# Patient Record
Sex: Male | Born: 1960 | Race: Black or African American | Hispanic: No | Marital: Married | Smoking: Current every day smoker
Health system: Southern US, Community
[De-identification: ages and names within clinical notes are randomized; demographics above are authoritative.]

## PROBLEM LIST (undated history)

## (undated) DIAGNOSIS — B3781 Candidal esophagitis: Secondary | ICD-10-CM

## (undated) DIAGNOSIS — D649 Anemia, unspecified: Secondary | ICD-10-CM

## (undated) DIAGNOSIS — K920 Hematemesis: Secondary | ICD-10-CM

## (undated) DIAGNOSIS — F329 Major depressive disorder, single episode, unspecified: Secondary | ICD-10-CM

## (undated) DIAGNOSIS — I10 Essential (primary) hypertension: Secondary | ICD-10-CM

## (undated) DIAGNOSIS — K219 Gastro-esophageal reflux disease without esophagitis: Secondary | ICD-10-CM

## (undated) DIAGNOSIS — G629 Polyneuropathy, unspecified: Secondary | ICD-10-CM

## (undated) DIAGNOSIS — M21372 Foot drop, left foot: Secondary | ICD-10-CM

## (undated) DIAGNOSIS — R1310 Dysphagia, unspecified: Secondary | ICD-10-CM

## (undated) DIAGNOSIS — K7460 Unspecified cirrhosis of liver: Secondary | ICD-10-CM

## (undated) DIAGNOSIS — M62569 Muscle wasting and atrophy, not elsewhere classified, unspecified lower leg: Secondary | ICD-10-CM

## (undated) DIAGNOSIS — N179 Acute kidney failure, unspecified: Secondary | ICD-10-CM

## (undated) DIAGNOSIS — I739 Peripheral vascular disease, unspecified: Secondary | ICD-10-CM

## (undated) DIAGNOSIS — M21371 Foot drop, right foot: Secondary | ICD-10-CM

## (undated) DIAGNOSIS — F32A Depression, unspecified: Secondary | ICD-10-CM

## (undated) DIAGNOSIS — E7800 Pure hypercholesterolemia, unspecified: Secondary | ICD-10-CM

## (undated) DIAGNOSIS — K861 Other chronic pancreatitis: Secondary | ICD-10-CM

## (undated) DIAGNOSIS — Z9889 Other specified postprocedural states: Secondary | ICD-10-CM

## (undated) DIAGNOSIS — I5022 Chronic systolic (congestive) heart failure: Secondary | ICD-10-CM

## (undated) HISTORY — DX: Other specified postprocedural states: Z98.890

---

## 1999-07-08 DIAGNOSIS — K861 Other chronic pancreatitis: Secondary | ICD-10-CM

## 1999-07-08 DIAGNOSIS — G629 Polyneuropathy, unspecified: Secondary | ICD-10-CM

## 1999-07-08 DIAGNOSIS — M21371 Foot drop, right foot: Secondary | ICD-10-CM

## 1999-07-08 DIAGNOSIS — M21372 Foot drop, left foot: Secondary | ICD-10-CM

## 1999-07-08 HISTORY — PX: PANCREAS SURGERY: SHX731

## 1999-07-08 HISTORY — DX: Other chronic pancreatitis: K86.1

## 1999-07-08 HISTORY — DX: Polyneuropathy, unspecified: G62.9

## 1999-07-08 HISTORY — DX: Foot drop, right foot: M21.372

## 1999-07-08 HISTORY — DX: Foot drop, right foot: M21.371

## 2005-10-29 ENCOUNTER — Emergency Department (HOSPITAL_COMMUNITY): Admission: EM | Admit: 2005-10-29 | Discharge: 2005-10-29 | Payer: Self-pay | Admitting: Emergency Medicine

## 2006-07-07 DIAGNOSIS — I10 Essential (primary) hypertension: Secondary | ICD-10-CM

## 2006-07-07 HISTORY — DX: Essential (primary) hypertension: I10

## 2007-04-13 ENCOUNTER — Emergency Department (HOSPITAL_COMMUNITY): Admission: EM | Admit: 2007-04-13 | Discharge: 2007-04-13 | Payer: Self-pay | Admitting: Emergency Medicine

## 2007-06-03 ENCOUNTER — Emergency Department (HOSPITAL_COMMUNITY): Admission: EM | Admit: 2007-06-03 | Discharge: 2007-06-03 | Payer: Self-pay | Admitting: Emergency Medicine

## 2007-12-06 HISTORY — PX: INCISE AND DRAIN ABCESS: PRO64

## 2007-12-24 ENCOUNTER — Inpatient Hospital Stay (HOSPITAL_COMMUNITY): Admission: EM | Admit: 2007-12-24 | Discharge: 2007-12-28 | Payer: Self-pay | Admitting: Urology

## 2007-12-24 ENCOUNTER — Encounter: Payer: Self-pay | Admitting: Emergency Medicine

## 2007-12-25 ENCOUNTER — Encounter (INDEPENDENT_AMBULATORY_CARE_PROVIDER_SITE_OTHER): Payer: Self-pay | Admitting: Urology

## 2007-12-31 ENCOUNTER — Ambulatory Visit (HOSPITAL_BASED_OUTPATIENT_CLINIC_OR_DEPARTMENT_OTHER): Admission: RE | Admit: 2007-12-31 | Discharge: 2007-12-31 | Payer: Self-pay | Admitting: Urology

## 2008-04-16 ENCOUNTER — Inpatient Hospital Stay (HOSPITAL_COMMUNITY): Admission: EM | Admit: 2008-04-16 | Discharge: 2008-04-19 | Payer: Self-pay | Admitting: Emergency Medicine

## 2008-09-19 ENCOUNTER — Emergency Department (HOSPITAL_COMMUNITY): Admission: EM | Admit: 2008-09-19 | Discharge: 2008-09-19 | Payer: Self-pay | Admitting: Emergency Medicine

## 2009-02-20 ENCOUNTER — Emergency Department (HOSPITAL_COMMUNITY): Admission: EM | Admit: 2009-02-20 | Discharge: 2009-02-20 | Payer: Self-pay | Admitting: Emergency Medicine

## 2009-05-06 ENCOUNTER — Emergency Department (HOSPITAL_COMMUNITY): Admission: EM | Admit: 2009-05-06 | Discharge: 2009-05-06 | Payer: Self-pay | Admitting: Emergency Medicine

## 2010-10-17 LAB — GLUCOSE, CAPILLARY

## 2010-11-19 NOTE — Discharge Summary (Signed)
NAMEBOYKIN, BAETZ              ACCOUNT NO.:  [id number]   MEDICAL RECORD NO.:  [id number]          PATIENT TYPE:  INP   LOCATION:  1410                         FACILITY:  [facility]   PHYSICIAN:  Fleet Contras, M.D.    DATE OF BIRTH:  1960-12-15   DATE OF ADMISSION:  04/16/2008  DATE OF DISCHARGE:  04/19/2008                               DISCHARGE SUMMARY   HISTORY OF PRESENT ILLNESS:  A 50 year old gentleman with past medical  history significant for type 2 diabetes mellitus and systemic  hypertension came to the emergency room at [facility] with  progressively enlarged swelling on the right cheek for a few days, also  becoming more painful.  At the emergency room, he was found to have  slightly elevated sugars in the 400s and also a calcium level of 14.  He  was therefore admitted to the hospital for intravenous antibiotics,  insulin therapy, and further evaluation of elevated calcium.   HOSPITAL COURSE:  On admission, the patient was started on subcutaneous  Lantus insulin, 60 units q.h.s.,  as well as sliding scale NovoLog per  moderate scale, was on IV fluids with normal saline at 10 cc  an hour.  His potassium at one point was 3.4  and this was repleted orally.  Parathyroid hormone level was checked and this was better than 2.5,  total protein was 6.8, magnesium was  low at 1.1.  This was repleted  intravenously.  He was on IV Unasyn for cellulitis on the right cheek  which started draining, but no incision and drainage was needed.  A  wound care consult was requested and the patient was kindly seen and  advised for daily wound dressings.  A surgical consult was  recommended,  but this was not deemed to be necessary.  As sugars improved, he was  taken off of hydrochlorothiazide with a possibility of worsening his  hypercalcemia.  Blood pressure remained stable on Benicar 40 mg a day.  Today, he is feeling much better and would like to go home.  There is no  chest pain  or swelling of the right cheek.  Vital signs are stable.  His  CPK is 186. His calcium is 9.1, magnesium 1.3, potassium is 3.9.   DISCHARGE DIAGNOSES:  1. Abscess of the right cheek, improving on IV antibiotics.  2. Hypercalcemia resolved with hydration.  3. Systemic hypertension, well-controlled.  4. Type 2 diabetes mellitus, well-controlled.   He is now considered stable for discharge.   DISCHARGE MEDICATIONS:  1. Benicar 40 mg daily.  2. Doxycycline 100 mg b.i.d.  3. Cymbalta 60 mg daily.  4. Lunesta 3 mg at bedtime.  5. Levemir Flexpen 70 units at bedtime.  6. Lyrica 150 mg twice a day.  7. NovoLog Flexpen for sliding scale.  8. Levitra  20 mg daily.  9. ActoPlusmet 15/850mg  b.i.d.   The patient is to follow up with me in 2 days on Friday, April 21, 2008 at 2 o'clock.  His plan of care was discussed with his wife and  their questions were answered.  Fleet Contras, M.D.  Electronically Signed     EA/MEDQ  D:  04/19/2008  T:  04/19/2008  Job:  [id number]

## 2010-11-19 NOTE — Op Note (Signed)
Christian Wall, Christian Wall              ACCOUNT NO.:  [id number]   MEDICAL RECORD NO.:  [id number]          PATIENT TYPE:  INP   LOCATION:  1439                         FACILITY:  [facility]   PHYSICIAN:  Jamison Neighbor, M.D.  DATE OF BIRTH:  03-27-1961   DATE OF PROCEDURE:  12/24/2007  DATE OF DISCHARGE:                               OPERATIVE REPORT   PREOPERATIVE DIAGNOSIS:  Scrotal abscess, probable Fournier gangrene.   POSTOPERATIVE DIAGNOSIS:  Fournier gangrene.   PROCEDURE:  Incision and drainage of abscess with debridement of  necrotic tissue.   SURGEON:  Jamison Neighbor, M.D.   ANESTHESIA:  General.   COMPLICATIONS:  None.   DRAINS:  None.   BRIEF HISTORY:  This 50 year old male is known to have paraplegia and  diabetes.  The patient developed some swelling and tenderness in the  scrotum but has diminished sensation.  He was seen in the emergency room  at [facility], where he clearly had an abscess.  An ultrasound showed a  fluid collection on the scrotal wall and there was some beginning of  drainage of purulence.  The patient was transferred to [facility] to  expedite incision and drainage.  He is now to undergo the procedure.  There is some concern this could be Fournier gangrene.  The patient does  have an elevated white count and is diabetic.  He will undergo incision,  drainage and debridement of all necrotic tissue.  He understands the  risks and benefits of the procedure and gave full informed consent.   PROCEDURE:  After successful induction of general anesthesia, the  patient was placed in thesupine position, prepped with Betadine and  draped in the usual sterile fashion.  Antibiotics were prepared to be  given as soon as the cultures had been obtained.  The patient does have  some changes in mobility in the knees and hips making exposure somewhat  difficult.  The necrotic tissue was incised.  The pus within the scrotum  was evacuated.  The testicle was mobilized  in order to free up all the  purulent pockets within the right hemiscrotum.  All necrotic tissue was  debrided and sent to pathology.  Fresh bleeding tissue was obtained.  The section that was removed was somewhat larger than a 50-cent piece,  but much of the scrotal wall appears to be intact and the testicle and  cord structures appear normal.  The penile shaft is fine.  The left  hemiscrotum is fine.  It is anticipated that this should be able to heal  with wet-to-dry dressing and potentially a delayed secondary closure.  Whether the testicle can remain where it is or if it has to get put into  a lateral thigh pouch remains a little bit unclear.  The entire area was  debrided.  A pack of saline-soaked  Kerlix was placed within the wound.  The patient tolerated the procedure  well and was taken to recovery in good condition.  He did receive  intraoperative Ancef and gentamicin as soon as the cultures had been  sent.  Cultures for aerobe and  anaerobe were sent.      Jamison Neighbor, M.D.  Electronically Signed     RJE/MEDQ  D:  12/24/2007  T:  12/25/2007  Job:  [id number]

## 2010-11-19 NOTE — H&P (Signed)
NAMEOATHER, MUILENBURG              ACCOUNT NO.:  [id number]   MEDICAL RECORD NO.:  [id number]          PATIENT TYPE:  INP   LOCATION:  1439                         [facility]ACILITY:  [facility]   PHYSICIAN:  Jamison Neighbor, M.D.  DATE OF BIRTH:  [birth year]   DATE OF ADMISSION:  12/24/2007  DATE OF DISCHARGE:                              HISTORY & PHYSICAL   ADMISSION DIAGNOSIS:  Scrotal abscess, probable Fournier gangrene.   HISTORY:  This is a 50 year old black male who has a known peripheral  neuropathy and has had problems with bilateral foot drop.  This dates  back to an episode of pancreatitis.  The patient uses crutches to  ambulate and has limited mobility in the hips.  He has chronic  irritation in the scrotum.  Over the past 3 days the patient has  developed a scrotal abscess.  We note that because of his neuropathy and  has diabetes he has diminished sensation.  He really did not have any  urinary symptoms associated with this but did note a slight fever.  An  ultrasound obtained in the [facility] emergency room showed an abscess  in the scrotal wall but what appeared to be a normal testicle.  It was  unclear if there was any gas.  There was concern that this would  represent Fournier gangrene.  He was transferred to [facility] to  expedite incision and drainage of this lesion.   PAST MEDICAL HISTORY:  Remarkable for diabetes.  He is insulin-  dependent.  He also has hypertension, esophageal reflux disease,  elevated cholesterol and has problems with peripheral neuropathy.  He  notes in the past he has had problems with pancreatitis.   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   His medications at the time admission were:  1. Lyrica 150 mg b.i.d.  2. Cymbalta 60 mg daily.  3. Pancrease 2 capsules t.i.d.  4. ACTOplus twice daily.  5. Omeprazole 20 mg twice daily.  6. Benicar once daily.  7. Levemir 60 units at bedtime.  8. NovoLog  pen.   SOCIAL HISTORY:  Remarkable for the fact that he smokes 1/2 pack of  cigarettes a day.  He does not use alcohol and rarely uses any  recreational drugs.   FAMILY HISTORY:  Noncontributory.   REVIEW OF SYSTEMS:  Obtained and is pertinent for occasional diarrhea,  erectile dysfunction and esophageal reflux disease.   EXAMINATION:  The patient is a well-developed black male without acute  distress.  HEENT:  Normocephalic and atraumatic.  Cranial nerves II-XII are grossly  intact.  NECK:  Supple.  No adenopathy or thyromegaly.  LUNGS:  Clear.  HEART:  A regular rate and rhythm with no murmurs, thrills, gallops,  rubs or heaves.  ABDOMEN:  Remarkable for multiple incisions but is soft and nontender  with no palpable masses, rebound or guarding.  There was no sign of any  of the abscess spurting up bring up onto the lower abdominal wall.  GU:  A normal penis and a normal left testicle.  On the right  hemiscrotum there was an abscess that was clearly palpable and there was  necrotic skin on the surface with some drainage noted.  This has had a  very foul, purulent smell.  The wall was thickened and there was clearly  necrotic tissue.  EXTREMITIES:  Pertinent for his bilateral foot drop.  The patient is  able to walk using [place] crutches but has paraplegia.  RECTAL:  Examination was not performed.   IMPRESSION:  1. Diabetes.  2. Scrotal abscess consistent with early Fournier gangrene.   PLAN:  The patient will be admitted in preparation for a debridement.      Jamison Neighbor, M.D.  Electronically Signed     RJE/MEDQ  D:  12/24/2007  T:  12/25/2007  Job:  [id number]

## 2010-11-19 NOTE — Op Note (Signed)
NAMEDANGELO, GUZZETTA              ACCOUNT NO.:  [id number]   MEDICAL RECORD NO.:  [id number]          PATIENT TYPE:  AMB   LOCATION:  NESC                         [facility]AC[facility]  [facility]   PHYSICIAN:  Jamison Neighbor, M.D.  DATE OF BIRTH:  1960/12/15   DATE OF PROCEDURE:  12/31/2007  DATE OF DISCHARGE:                               OPERATIVE REPORT   PREOPERATIVE DIAGNOSIS:  Scrotal abscess.   POSTOPERATIVE DIAGNOSES:  Scrotal abscess.   PROCEDURE:  Incision and drainage with additional debridement status  post resection of Fournier's gangrene   SURGEON:  Jamison Neighbor, M.D.   ANESTHESIA:  General.   COMPLICATIONS:  None.   DRAINS:  None.   BRIEF HISTORY:  This 50 year old male developed scrotal abscess, was  felt to be consistent with early Fournier's gangrene while there was not  a significant amount of gas found on the original examination there was  evidence of true necrotic tissue.  The patient was taken to the  operating room on an urgent basis where he underwent incision and  drainage with debridement of a portion of the scrotal wall.  The patient  was kept in the hospital on antibiotic therapy until his white count  returned to normal and he completely defervesced.  The patient was sent  home on wet-to-dry dressings.   Follow up examination showed there was some additional necrotic tissue  along the edge of the incision.  He is now to undergo debridement.  He  understands the risks and benefits of the procedure and gave full  informed consent.   PROCEDURE:  After successful induction of general anesthesia the patient  was placed in the supine position, prepped with Betadine and draped in  the usual sterile fashion.  Inspection showed the patient had necrotic  tissue primarily on the underside of the open scrotal wound.  The depths  of the wound showed no evidence of any abscess formation.  The testicle  itself was normal and appears that good granulation tissue is  beginning  to develop.  The areas of necrosis were completely debrided until all  good granulation tissue was identified.  The bleeding edges were  cauterized where necessary with electrocautery with attempts made to  limit that as much as possible to prevent  excessive tissue damage.  The area was packed open with saline-soaked  gauze.  The patient tolerated procedure and was taken to recovery in  good condition.  He will continue on Augmentin for additional 7 days.  He will continue on his pain medication and return to see me in follow-  up in approximately 2 weeks.      Jamison Neighbor, M.D.  Electronically Signed     RJE/MEDQ  D:  12/31/2007  T:  12/31/2007  Job:  [id number]

## 2010-11-22 NOTE — Discharge Summary (Signed)
NAMEYADER, CRIGER              ACCOUNT NO.:  [id number]   MEDICAL RECORD NO.:  [id number]          PATIENT TYPE:  INP   LOCATION:  1439                         FACILITY:  [facility]   PHYSICIAN:  Jamison Neighbor, M.D.  DATE OF BIRTH:  1960-10-23   DATE OF ADMISSION:  12/24/2007  DATE OF DISCHARGE:  12/28/2007                               DISCHARGE SUMMARY   DISCHARGE DIAGNOSES:  Fournier's gangrene of scrotum.   SECONDARY DIAGNOSES:  1. Diabetes.  2. Peripheral neuropathy.  3. Elevated cholesterol.  4. Hypertension.  5. Esophageal reflux disease.  6. Tobacco use.  7. Elevated cholesterol.   HISTORY:  This 50 year old male with known peripheral neuropathy  presented to the emergency room with a scrotal swelling.  The patient  was felt to have Fourier's gangrene.  He was seen in the hospital where  he underwent limited resection of the scrotum to remove the entire  abscess and debride out the deep tissue.  The patient was admitted to  the hospital for antibiotic therapy following the procedure.   The patient really did very nicely in the hospital.  He defervesced  quite quickly.  His wounds straightened out very nicely.  The skin does  look reasonably healthy.  At first, it was thought he might not need any  debridement.  It did turn out that subsequently he did require one  additional debridement, but that was after this hospitalization.  The  patient's white count came down on antibiotic therapy.  He was given  broad-spectrum antibiotic therapy with Zosyn and gentamicin.  Gentamicin  was eventually discontinued.  The culture grew out group B strep.   The patient was sent home with Tylox and Augmentin.  It was felt that  there was additional areas that needed debridement, and arrangements  were made for him to have that done on an outpatient basis and continue  home health care wet-to-dry dressings until fully healed.      Jamison Neighbor, M.D.  Electronically  Signed     RJE/MEDQ  D:  01/18/2008  T:  01/18/2008  Job:  [id number]

## 2011-04-03 LAB — ANAEROBIC CULTURE

## 2011-04-03 LAB — CBC
HCT: 34.6 — ABNORMAL LOW
HCT: 27.3 — ABNORMAL LOW
HCT: 27.4 — ABNORMAL LOW
HCT: 27.9 — ABNORMAL LOW
Hemoglobin: 11.5 — ABNORMAL LOW
Hemoglobin: 10.3 — ABNORMAL LOW
Hemoglobin: 9 — ABNORMAL LOW
Hemoglobin: 9 — ABNORMAL LOW
Hemoglobin: 9 — ABNORMAL LOW
MCHC: 33.3
MCHC: 32.2
MCHC: 32.5
MCV: 78.4
MCV: 77 — ABNORMAL LOW
MCV: 77.4 — ABNORMAL LOW
Platelets: 216
Platelets: 322
RBC: 4.41
RBC: 3.51 — ABNORMAL LOW
RBC: 4.06 — ABNORMAL LOW
RDW: 14.5
RDW: 14.6
RDW: 14.9
RDW: 15
WBC: 18.8 — ABNORMAL HIGH
WBC: 14.7 — ABNORMAL HIGH

## 2011-04-03 LAB — BASIC METABOLIC PANEL
BUN: 14
BUN: 17
BUN: 18
BUN: 22
CO2: 25
Calcium: 9.1
Calcium: 7.6 — ABNORMAL LOW
Chloride: 93 — ABNORMAL LOW
Creatinine, Ser: 1.11
GFR calc Af Amer: >60
GFR calc Af Amer: >60
GFR calc non Af Amer: >60
GFR calc non Af Amer: >60
Glucose, Bld: 227 — ABNORMAL HIGH
Potassium: 4.1
Potassium: 5.1
Sodium: 131 — ABNORMAL LOW
Sodium: 132 — ABNORMAL LOW

## 2011-04-03 LAB — DIFFERENTIAL
Basophils Absolute: 0
Basophils Relative: 0
Eosinophils Absolute: 0
Eosinophils Relative: 0
Lymphocytes Relative: 9 — ABNORMAL LOW
Lymphs Abs: 1.7
Monocytes Absolute: 1.7 — ABNORMAL HIGH
Monocytes Relative: 9
Neutro Abs: 15.4 — ABNORMAL HIGH
Neutrophils Relative %: 82 — ABNORMAL HIGH

## 2011-04-03 LAB — URINE MICROSCOPIC-ADD ON

## 2011-04-03 LAB — COMPREHENSIVE METABOLIC PANEL
ALT: 50
AST: 33
Alkaline Phosphatase: 164 — ABNORMAL HIGH
BUN: 16
CO2: 26
Calcium: 8.3 — ABNORMAL LOW
Chloride: 99
Creatinine, Ser: 1.04
Potassium: 3.3 — ABNORMAL LOW
Sodium: 134 — ABNORMAL LOW

## 2011-04-03 LAB — WOUND CULTURE

## 2011-04-03 LAB — URINALYSIS, ROUTINE W REFLEX MICROSCOPIC
Glucose, UA: 100 — AB
Hgb urine dipstick: NEGATIVE
Ketones, ur: >80 — AB
Nitrite: POSITIVE — AB
Protein, ur: 30 — AB
Specific Gravity, Urine: 1.027
Urobilinogen, UA: 4 — ABNORMAL HIGH
pH: 5.5

## 2011-04-03 LAB — BASIC METABOLIC PANEL WITH GFR
CO2: 25
Chloride: 99
Creatinine, Ser: 1.1
GFR calc Af Amer: >60
GFR calc non Af Amer: >60
Glucose, Bld: 228 — ABNORMAL HIGH
Potassium: 3.7
Sodium: 136

## 2011-04-03 LAB — CULTURE, ROUTINE-ABSCESS

## 2011-04-03 LAB — POCT I-STAT 4, (NA,K, GLUC, HGB,HCT)
Glucose, Bld: 191 — ABNORMAL HIGH
Hemoglobin: 10.5 — ABNORMAL LOW
Operator id: 268271

## 2011-04-03 LAB — URINE CULTURE: Colony Count: 35000

## 2011-04-07 LAB — COMPREHENSIVE METABOLIC PANEL
AST: 13
AST: 16
Albumin: 3.5
BUN: 23
CO2: 38 — ABNORMAL HIGH
Chloride: 81 — ABNORMAL LOW
Chloride: 92 — ABNORMAL LOW
Creatinine, Ser: 1.11
GFR calc Af Amer: >60
Glucose, Bld: 402 — ABNORMAL HIGH
Total Bilirubin: 1
Total Bilirubin: 1
Total Protein: 6.9
Total Protein: 8.7 — ABNORMAL HIGH

## 2011-04-07 LAB — PTH, INTACT AND CALCIUM
Calcium, Total (PTH): 12.5 — ABNORMAL HIGH
PTH: <2.5 — ABNORMAL LOW

## 2011-04-07 LAB — MAGNESIUM
Magnesium: 1 — ABNORMAL LOW
Magnesium: 1.3 — ABNORMAL LOW

## 2011-04-07 LAB — URINALYSIS, ROUTINE W REFLEX MICROSCOPIC
Bilirubin Urine: NEGATIVE
Ketones, ur: 15 — AB
Specific Gravity, Urine: 1.023
Urobilinogen, UA: 0.2
pH: 6

## 2011-04-07 LAB — BASIC METABOLIC PANEL
BUN: 13
BUN: 18
CO2: 29
Calcium: 10.5
Chloride: 105
Chloride: 96
Creatinine, Ser: 0.93
GFR calc Af Amer: >60
GFR calc Af Amer: >60
GFR calc non Af Amer: >60
Potassium: 3.9

## 2011-04-07 LAB — DIFFERENTIAL
Basophils Absolute: 0.1
Basophils Relative: 1
Eosinophils Relative: 1
Lymphocytes Relative: 22
Monocytes Absolute: 0.8
Monocytes Relative: 7

## 2011-04-07 LAB — PHOSPHORUS: Phosphorus: 2.5

## 2011-04-07 LAB — GLUCOSE, CAPILLARY
Glucose-Capillary: 184 — ABNORMAL HIGH
Glucose-Capillary: 186 — ABNORMAL HIGH
Glucose-Capillary: 188 — ABNORMAL HIGH
Glucose-Capillary: 230 — ABNORMAL HIGH
Glucose-Capillary: 251 — ABNORMAL HIGH
Glucose-Capillary: 257 — ABNORMAL HIGH
Glucose-Capillary: 271 — ABNORMAL HIGH
Glucose-Capillary: 277 — ABNORMAL HIGH
Glucose-Capillary: 278 — ABNORMAL HIGH
Glucose-Capillary: 448 — ABNORMAL HIGH

## 2011-04-07 LAB — VITAMIN D 1,25 DIHYDROXY: Vit D, 1,25-Dihydroxy: 9 pg/mL — ABNORMAL LOW (ref 15–75)

## 2011-04-07 LAB — CBC
HCT: 40.2
Hemoglobin: 13.1
MCV: 76.7 — ABNORMAL LOW
Platelets: 278
RBC: 4.26
RBC: 5.31
RDW: 16.4 — ABNORMAL HIGH
WBC: 7.1

## 2011-04-07 LAB — PROTEIN ELECTROPH W RFLX QUANT IMMUNOGLOBULINS
Albumin ELP: 53 — ABNORMAL LOW
Beta 2: 6.1
Beta Globulin: 6.4
Gamma Globulin: 15.9

## 2011-04-07 LAB — CALCIUM, IONIZED: Calcium, Ion: 1.76 — ABNORMAL HIGH

## 2011-04-07 LAB — VITAMIN D 25 HYDROXY (VIT D DEFICIENCY, FRACTURES): Vit D, 25-Hydroxy: 7 — ABNORMAL LOW (ref 30–89)

## 2011-04-15 LAB — COMPREHENSIVE METABOLIC PANEL
ALT: 14
AST: 14
Albumin: 4.6
Alkaline Phosphatase: 67
Chloride: 92 — ABNORMAL LOW
Potassium: 4
Sodium: 134 — ABNORMAL LOW
Total Protein: 8.9 — ABNORMAL HIGH

## 2011-04-15 LAB — DIFFERENTIAL
Basophils Relative: 1
Eosinophils Absolute: 0 — ABNORMAL LOW
Eosinophils Relative: 0
Monocytes Absolute: 0.5
Monocytes Relative: 5

## 2011-04-15 LAB — CBC
Platelets: 386
RDW: 14.4
WBC: 8.6

## 2012-02-25 ENCOUNTER — Encounter (HOSPITAL_COMMUNITY): Payer: Self-pay

## 2012-02-25 ENCOUNTER — Emergency Department (HOSPITAL_COMMUNITY): Payer: Medicare Other

## 2012-02-25 ENCOUNTER — Emergency Department (HOSPITAL_COMMUNITY)
Admission: EM | Admit: 2012-02-25 | Discharge: 2012-02-25 | Disposition: A | Discharge status: Home / Self Care | Payer: Medicare Other | Attending: Emergency Medicine | Admitting: Emergency Medicine

## 2012-02-25 DIAGNOSIS — E119 Type 2 diabetes mellitus without complications: Secondary | ICD-10-CM | POA: Insufficient documentation

## 2012-02-25 DIAGNOSIS — F17200 Nicotine dependence, unspecified, uncomplicated: Secondary | ICD-10-CM | POA: Insufficient documentation

## 2012-02-25 DIAGNOSIS — M25519 Pain in unspecified shoulder: Secondary | ICD-10-CM | POA: Insufficient documentation

## 2012-02-25 DIAGNOSIS — M62838 Other muscle spasm: Secondary | ICD-10-CM | POA: Insufficient documentation

## 2012-02-25 HISTORY — DX: Muscle wasting and atrophy, not elsewhere classified, unspecified lower leg: M62.569

## 2012-02-25 MED ORDER — HYDROCODONE-ACETAMINOPHEN 5-325 MG PO TABS: 2.0000 | ORAL_TABLET | Freq: Four times a day (QID) | ORAL | Status: AC | PRN

## 2012-02-25 MED ORDER — METHOCARBAMOL 500 MG PO TABS: 500.0000 mg | ORAL_TABLET | Freq: Two times a day (BID) | ORAL | Status: AC

## 2012-02-25 MED ORDER — HYDROCODONE-ACETAMINOPHEN 5-325 MG PO TABS
2.0000 | ORAL_TABLET | Freq: Once | ORAL | Status: AC
  Administered 2012-02-25: 2 via ORAL
  Filled 2012-02-25: qty 2

## 2012-02-25 NOTE — ED Notes (Signed)
Pt c/o left shoulder blade pain to upper left arm pain x 12 hours; states strained left arm about two wks ago and has hurt off and on since then

## 2012-02-25 NOTE — ED Provider Notes (Signed)
History     CSN: [id number]  Arrival date & time 02/25/12  0509   First MD Initiated Contact with Patient 02/25/12 0534      Chief Complaint  Patient presents with  . Shoulder Pain    (Consider location/radiation/quality/duration/timing/severity/associated sxs/prior treatment) HPI Comments: Patient presenting with pain of the left side of his neck, upper back, and left scapula.  Pain has been present for the past day.  He reports that he has been doing some heavy lifting recently.  He describes the pain as "muscle spasms" He denies any trauma.  No numbness or tingling.  No erythema or swelling.  He has   The history is provided by the patient.    Past Medical History  Diagnosis Date  . Diabetes mellitus   . Atrophy of calf muscles     Past Surgical History  Procedure Date  . Pancreas surgery     No family history on file.  History  Substance Use Topics  . Smoking status: Current Everyday Smoker -- 1.0 packs/day for 30 years    Types: Cigarettes  . Smokeless tobacco: Not on file  . Alcohol Use:       Review of Systems  Constitutional: Negative for fever and chills.  HENT: Positive for neck pain.   Musculoskeletal: Positive for back pain. Negative for joint swelling.  Skin: Negative for color change and wound.  Neurological: Negative for numbness and headaches.    Allergies  Review of patient's allergies indicates no known allergies.  Home Medications   Current Outpatient Rx  Name Route Sig Dispense Refill  . DULOXETINE HCL 60 MG PO CPEP Oral Take 60 mg by mouth daily.    Marland Kitchen ESOMEPRAZOLE MAGNESIUM 40 MG PO CPDR Oral Take 40 mg by mouth daily before breakfast.    . INSULIN ASPART 100 UNIT/ML [place] SOLN Subcutaneous Inject 3-6 Units into the skin 3 (three) times daily before meals. Per sliding scale depending on what he eats    . INSULIN GLARGINE 100 UNIT/ML [place] SOLN Subcutaneous Inject 70 Units into the skin at bedtime.    Marland Kitchen PANCRELIPASE (LIP-PROT-AMYL) 12000  UNITS PO CPEP Oral Take 1-3 capsules by mouth 2 (two) times daily. 3 capsules before meal, and 1 capsule before snack    . PIOGLITAZONE HCL-METFORMIN HCL 15-850 MG PO TABS Oral Take 1 tablet by mouth 2 (two) times daily with a meal.    . PREGABALIN 150 MG PO CAPS Oral Take 150 mg by mouth 2 (two) times daily.      BP 151/83  Pulse 102  Temp 97.7 F (36.5 C)  Resp 20  SpO2 97%  Physical Exam  Nursing note and vitals reviewed. Constitutional: He appears well-developed and well-nourished. No distress.  HENT:  Head: Normocephalic and atraumatic.  Neck: Normal range of motion. Neck supple.  Cardiovascular: Normal rate, regular rhythm and normal heart sounds.   Pulmonary/Chest: Effort normal and breath sounds normal.  Musculoskeletal:       Left shoulder: He exhibits no tenderness, no bony tenderness, no swelling, no effusion, no deformity and normal pulse.       Pain with ROM of left shoulder  Neurological: He is alert. No sensory deficit.  Skin: Skin is warm and dry. He is not diaphoretic.    ED Course  Procedures (including critical care time)  Labs Reviewed - No data to display Dg Shoulder Left  02/25/2012  *RADIOLOGY REPORT*  Clinical Data: Shoulder pain and limited range of motion for 2 days.  No injury.  LEFT SHOULDER - 2+ VIEW  Comparison: Chest 04/16/2008  Findings: Degenerative changes in the left shoulder with prominent ossification of the coracoclavicular joint and calcified loose bodies in the shoulder joint as well as exostosis or bursal calcification medial to the humeral metaphysis.  Changes likely represent degenerative changes with either calcific tendonitis or synovial osteochondromatosis versus dystrophic calcification. Changes were present on the previous chest radiograph but appeared to have progressed since that time.  There is no evidence of acute fracture or subluxation.  No destructive bone lesions.  Bone cortex and trabecular architecture appear intact.   IMPRESSION: Calcification in the coracoclavicular ligament and multiple calcified loose bodies and around the shoulder joint.  No acute fracture or subluxation.   Original Report Authenticated By: Marlon Pel, M.D.      No diagnosis found.    MDM  Patient presenting with pain of the left trapezius and left shoulder.  Negative xray.  Neurovascularly intact.  Suspect muscle strain.         Pascal Lux [place], PA-C 02/26/12 1304

## 2012-02-27 ENCOUNTER — Emergency Department (HOSPITAL_COMMUNITY)
Admission: EM | Admit: 2012-02-27 | Discharge: 2012-02-27 | Disposition: A | Discharge status: Home / Self Care | Payer: Medicare Other | Attending: Emergency Medicine | Admitting: Emergency Medicine

## 2012-02-27 ENCOUNTER — Encounter (HOSPITAL_COMMUNITY): Payer: Self-pay | Admitting: Emergency Medicine

## 2012-02-27 DIAGNOSIS — Z794 Long term (current) use of insulin: Secondary | ICD-10-CM | POA: Insufficient documentation

## 2012-02-27 DIAGNOSIS — F17200 Nicotine dependence, unspecified, uncomplicated: Secondary | ICD-10-CM | POA: Insufficient documentation

## 2012-02-27 DIAGNOSIS — Z79899 Other long term (current) drug therapy: Secondary | ICD-10-CM | POA: Insufficient documentation

## 2012-02-27 DIAGNOSIS — E119 Type 2 diabetes mellitus without complications: Secondary | ICD-10-CM | POA: Insufficient documentation

## 2012-02-27 DIAGNOSIS — M25519 Pain in unspecified shoulder: Secondary | ICD-10-CM | POA: Insufficient documentation

## 2012-02-27 MED ORDER — HYDROMORPHONE HCL PF 2 MG/ML IJ SOLN
2.0000 mg | Freq: Once | INTRAMUSCULAR | Status: AC
  Administered 2012-02-27: 2 mg via INTRAMUSCULAR
  Filled 2012-02-27: qty 1

## 2012-02-27 MED ORDER — ZOLPIDEM TARTRATE 5 MG PO TABS: 5.0000 mg | ORAL_TABLET | Freq: Every evening | ORAL | Status: DC | PRN

## 2012-02-27 MED ORDER — HYDROCODONE-ACETAMINOPHEN 5-500 MG PO TABS: 1.0000 | ORAL_TABLET | Freq: Four times a day (QID) | ORAL | Status: AC | PRN

## 2012-02-27 MED ORDER — ONDANSETRON 4 MG PO TBDP
4.0000 mg | ORAL_TABLET | Freq: Once | ORAL | Status: AC
  Administered 2012-02-27: 4 mg via ORAL
  Filled 2012-02-27: qty 1

## 2012-02-27 NOTE — ED Notes (Signed)
Patient is alert and oriented x3.  He is complaining of left arm, shoulder and neck pain that started 4 days ago.   He was seen here earlier this week for same issue. He states that it is possible that he strained his shoulder lifting weights. The pain can be palpated in the left arm currently.  He has complete ROM of motion of the left arm.  Current pain is rated 10 of 10

## 2012-02-27 NOTE — ED Provider Notes (Signed)
History     CSN: [id number]  Arrival date & time 02/27/12  0244   First MD Initiated Contact with Patient 02/27/12 0354      Chief Complaint  Patient presents with  . Shoulder Pain    (Consider location/radiation/quality/duration/timing/severity/associated sxs/prior treatment) HPI   Pt presents to the ER with complaints of left shoulder pain. He was seen two days ago in the ER and given Robaxin and percocet and says taht it has not helped at all. The patient uses hand crutches to walk around due to atrophy of calf muscles. He is also a diabetic. He says in order to get around he has to use the crutches and with his shoulder hurting, things are becoming unbearable and that he is not sleeping because of the pain and has become very grouchy. The pain goes down his left arm towards his elbow, over his left scapula, left ribs, and up his left neck. Touching and moving those areas worsen the pain. Pain has been persistent for 1 week and nothing has made it better. Pain 10/10.  Pt is in NAD and VSS but he is very agitated. Past Medical History  Diagnosis Date  . Diabetes mellitus   . Atrophy of calf muscles     Past Surgical History  Procedure Date  . Pancreas surgery     History reviewed. No pertinent family history.  History  Substance Use Topics  . Smoking status: Current Everyday Smoker -- 1.0 packs/day for 30 years    Types: Cigarettes  . Smokeless tobacco: Not on file  . Alcohol Use:       Review of Systems   HEENT: denies blurry vision or change in hearing PULMONARY: Denies difficulty breathing and SOB CARDIAC: denies chest pain or heart palpitations MUSCULOSKELETAL:   unable to ambulate due to atrophy ABDOMEN AL: denies abdominal pain GU: denies loss of bowel or urinary control NEURO: denies numbness and tingling in extremities SKIN: no new rashes PSYCH: patient denies anxiety or depression. NECK: Pt denies having neck pain     Allergies  Review of  patient's allergies indicates no known allergies.  Home Medications   Current Outpatient Rx  Name Route Sig Dispense Refill  . DULOXETINE HCL 60 MG PO CPEP Oral Take 60 mg by mouth daily.    Marland Kitchen ESOMEPRAZOLE MAGNESIUM 40 MG PO CPDR Oral Take 40 mg by mouth daily before breakfast.    . HYDROCODONE-ACETAMINOPHEN 5-325 MG PO TABS Oral Take 2 tablets by mouth every 6 (six) hours as needed for pain. 10 tablet 0  . INSULIN ASPART 100 UNIT/ML [place] SOLN Subcutaneous Inject 3-6 Units into the skin 3 (three) times daily before meals. Per sliding scale depending on what he eats    . INSULIN GLARGINE 100 UNIT/ML [place] SOLN Subcutaneous Inject 70 Units into the skin at bedtime.    Marland Kitchen PANCRELIPASE (LIP-PROT-AMYL) 12000 UNITS PO CPEP Oral Take 1-3 capsules by mouth 2 (two) times daily. 3 capsules before meal, and 1 capsule before snack    . METHOCARBAMOL 500 MG PO TABS Oral Take 1 tablet (500 mg total) by mouth 2 (two) times daily. 20 tablet 0  . PIOGLITAZONE HCL-METFORMIN HCL 15-850 MG PO TABS Oral Take 1 tablet by mouth 2 (two) times daily with a meal.    . PREGABALIN 150 MG PO CAPS Oral Take 150 mg by mouth 2 (two) times daily.      BP 170/88  Pulse 96  Temp 98.9 F (37.2 C) (Oral)  Resp  20  SpO2 92%  Physical Exam  Nursing note and vitals reviewed. Constitutional: He appears well-developed and well-nourished. No distress.  HENT:  Head: Normocephalic and atraumatic.  Eyes: Pupils are equal, round, and reactive to light.  Neck: Normal range of motion. Neck supple.  Cardiovascular: Normal rate and regular rhythm.   Pulmonary/Chest: Effort normal.  Abdominal: Soft.  Musculoskeletal:       Left shoulder: He exhibits tenderness, bony tenderness, pain and spasm. He exhibits normal range of motion, no swelling, no effusion, no crepitus, no deformity, no laceration, normal pulse and normal strength.  Neurological: He is alert.  Skin: Skin is warm and dry.    ED Course  Procedures (including critical  care time)  Labs Reviewed - No data to display Dg Shoulder Left  02/25/2012  *RADIOLOGY REPORT*  Clinical Data: Shoulder pain and limited range of motion for 2 days.  No injury.  LEFT SHOULDER - 2+ VIEW  Comparison: Chest 04/16/2008  Findings: Degenerative changes in the left shoulder with prominent ossification of the coracoclavicular joint and calcified loose bodies in the shoulder joint as well as exostosis or bursal calcification medial to the humeral metaphysis.  Changes likely represent degenerative changes with either calcific tendonitis or synovial osteochondromatosis versus dystrophic calcification. Changes were present on the previous chest radiograph but appeared to have progressed since that time.  There is no evidence of acute fracture or subluxation.  No destructive bone lesions.  Bone cortex and trabecular architecture appear intact.  IMPRESSION: Calcification in the coracoclavicular ligament and multiple calcified loose bodies and around the shoulder joint.  No acute fracture or subluxation.   Original Report Authenticated By: Marlon Pel, M.D.      1. Shoulder pain       MDM  xrays done yesterday which shows calcification of joint.   I think he needs Ortho to have an MRI done. Pt most likely has ligament issue due to chronic injury from hand crutches.   Pt given pain medication in ER.  He is to take Ibuprofen TID with food, will give zolpidem and refill pain meds. Pt also given ortho referral.       Dorthula Matas, PA 02/27/12 [phone]

## 2012-02-27 NOTE — ED Notes (Signed)
Pt states he is having pain in his left shoulder into his back, neck and chest  Pt states he was here a couple days ago for same  Pt states he had xrays done at that time that were negative  Pt states the pain has been continuous since he was here the other day

## 2012-02-27 NOTE — ED Notes (Signed)
Patient is alert and oriented x3.  He was given DC instructions and follow up visit instructions.  Patient gave verbal understanding.  He was DC via wheelchair with family to home.  V/S stable.  He was not showing any signs of distress on DC 

## 2012-02-27 NOTE — ED Provider Notes (Signed)
Medical screening examination/treatment/procedure(s) were performed by non-physician practitioner and as supervising physician I was immediately available for consultation/collaboration.  Toy Baker, MD 02/27/12 [phone]

## 2012-02-28 NOTE — ED Provider Notes (Signed)
Medical screening examination/treatment/procedure(s) were performed by non-physician practitioner and as supervising physician I was immediately available for consultation/collaboration.   Kenroy Timberman, MD 02/28/12 2303 

## 2012-03-12 ENCOUNTER — Other Ambulatory Visit (HOSPITAL_COMMUNITY): Payer: Self-pay | Admitting: Orthopedic Surgery

## 2012-03-12 DIAGNOSIS — M542 Cervicalgia: Secondary | ICD-10-CM

## 2012-03-16 ENCOUNTER — Ambulatory Visit (HOSPITAL_COMMUNITY)
Admission: RE | Admit: 2012-03-16 | Discharge: 2012-03-16 | Disposition: A | Discharge status: Home / Self Care | Payer: Medicare Other | Source: Ambulatory Visit | Attending: Orthopedic Surgery | Admitting: Orthopedic Surgery

## 2012-03-16 DIAGNOSIS — M542 Cervicalgia: Secondary | ICD-10-CM

## 2012-03-16 DIAGNOSIS — M5020 Other cervical disc displacement, unspecified cervical region: Secondary | ICD-10-CM | POA: Insufficient documentation

## 2012-04-07 ENCOUNTER — Other Ambulatory Visit: Payer: Self-pay | Admitting: Neurosurgery

## 2012-04-08 ENCOUNTER — Encounter (HOSPITAL_COMMUNITY): Payer: Self-pay | Admitting: Pharmacy Technician

## 2012-04-09 ENCOUNTER — Encounter (HOSPITAL_COMMUNITY)
Admission: RE | Admit: 2012-04-09 | Discharge: 2012-04-09 | Disposition: A | Discharge status: Home / Self Care | Payer: Medicare Other | Source: Ambulatory Visit | Attending: Neurosurgery | Admitting: Neurosurgery

## 2012-04-09 ENCOUNTER — Encounter (HOSPITAL_COMMUNITY): Payer: Self-pay

## 2012-04-09 HISTORY — DX: Major depressive disorder, single episode, unspecified: F32.9

## 2012-04-09 HISTORY — DX: Depression, unspecified: F32.A

## 2012-04-09 LAB — CBC WITH DIFFERENTIAL/PLATELET
Basophils Absolute: 0 10*3/uL (ref 0.0–0.1)
Eosinophils Absolute: 0.1 10*3/uL (ref 0.0–0.7)
HCT: 40.4 % (ref 39.0–52.0)
Lymphs Abs: 2 10*3/uL (ref 0.7–4.0)
MCH: 25 pg — ABNORMAL LOW (ref 26.0–34.0)
MCHC: 33.4 g/dL (ref 30.0–36.0)
MCV: 74.8 fL — ABNORMAL LOW (ref 78.0–100.0)
Monocytes Absolute: 0.4 10*3/uL (ref 0.1–1.0)
Neutro Abs: 6.4 10*3/uL (ref 1.7–7.7)
RDW: 14.9 % (ref 11.5–15.5)

## 2012-04-09 LAB — BASIC METABOLIC PANEL
BUN: 17 mg/dL (ref 6–23)
CO2: 30 mEq/L (ref 19–32)
Chloride: 91 mEq/L — ABNORMAL LOW (ref 96–112)
Creatinine, Ser: 0.8 mg/dL (ref 0.50–1.35)

## 2012-04-09 LAB — SURGICAL PCR SCREEN: Staphylococcus aureus: NEGATIVE

## 2012-04-09 NOTE — Pre-Procedure Instructions (Signed)
20 Christian Wall  04/09/2012   Your procedure is scheduled on:  Tuesday April 13, 2012 at 1040 AM  Report to [facility] at 0840 AM.  Call this number if you have problems the morning of surgery: [phone]   Remember:   Do not eat food or drink:After Midnight.      Take these medicines the morning of surgery with A SIP OF WATER: No diabetic  meds or insulin. Take Cymbalta, Nexium, and Lyrica   Do not wear jewelry,   Do not wear lotions, powders, or perfumes. You may wear deodorant.  Do not shave 48 hours prior to surgery. Men may shave face and neck.  Do not bring valuables to the hospital.  Contacts, dentures or bridgework may not be worn into surgery.  Leave suitcase in the car. After surgery it may be brought to your room.  For patients admitted to the hospital, checkout time is 11:00 AM the day of discharge.   Patients discharged the day of surgery will not be allowed to drive home.    Special Instructions: Shower using CHG 2 nights before surgery and the night before surgery.  If you shower the day of surgery use CHG.  Use special wash - you have one bottle of CHG for all showers.  You should use approximately 1/3 of the bottle for each shower.   Please read over the following fact sheets that you were given: Pain Booklet, Coughing and Deep Breathing, Total Joint Packet and Surgical Site Infection Prevention

## 2012-04-12 MED ORDER — DEXAMETHASONE SODIUM PHOSPHATE 10 MG/ML IJ SOLN
10.0000 mg | INTRAMUSCULAR | Status: AC
  Administered 2012-04-13: 10 mg via INTRAVENOUS
  Filled 2012-04-12: qty 1

## 2012-04-12 MED ORDER — CEFAZOLIN SODIUM-DEXTROSE 2-3 GM-% IV SOLR
2.0000 g | INTRAVENOUS | Status: AC
  Administered 2012-04-13: 2 g via INTRAVENOUS
  Filled 2012-04-12: qty 50

## 2012-04-12 NOTE — Consult Note (Signed)
Anesthesia Chart Review:  Patient is a 51 year old male scheduled for C5-6 ACDF on 04/13/12 by Dr. Jordan Likes.  History includes smoking, DM2, depression, pancreatic surgery (not specified).  BMI 29.  PCP is Dr. Fleet Contras.  EKG on 04/09/12 showed ST @ 113 bpm, possible anterior infarct (age undetermined), prolonged QT.  Possible anterior infarct changes were present on prior EKGs from 2009 (see Muse).  CXR impression on 04/09/12 stated: Generalized hyperinflation configuration consistent with COPD. Patchy density in the right cardiophrenic angle region on the PA image which appears new or more prominent compared with previous study. This is consistent with a small area of atelectasis and infiltrative density in the medial aspect of the right middle lobe. No consolidation is evident. Suggest radiographic follow-up after therapy. There is some central peribronchial thickening. This may be associated with bronchitis, asthma, and reactive airway disease.  Labs noted.  WBC 8.9.  Non-fasting glucose 332.  Recheck CBG on arrival.  I called and spoke with Mr. Wissner earlier today.  He does smoke, but denies SOB, chest pain, fever, chills, cough, or other URI symptoms.  He reports his home glucose readings are typically in the 100-200's range.  I reviewed Mr. Sena's CXR findings with him.  He is aware that he will need a follow-up CXR as determined by Dr. Jordan Likes or his PCP, and possibly a CT scan of the chest if his CXR abnormality persistent.  Subjectively, he does not report symptoms consistent with PNA.    I called his CXR results to [facility] at Dr. Lindalou Hose office and to [facility] at Dr. Albertina Parr office.  Also his EKG, CXR, labs results from 04/09/12 were faxed to Dr. Albertina Parr office with confirmation.  I reviewed above with Anesthesiologist Dr. Noreene Larsson.  If patient does not present with new respiratory symptoms and no worrisome cardiopulmonary findings on physical exam then would anticipate he could proceed  from an anesthesia standpoint with plan for post-operative follow-up as discussed above.  Shonna Chock, PA-C 04/12/12 1146

## 2012-04-13 ENCOUNTER — Encounter (HOSPITAL_COMMUNITY): Payer: Self-pay | Admitting: Neurosurgery

## 2012-04-13 ENCOUNTER — Encounter (HOSPITAL_COMMUNITY): Payer: Self-pay | Admitting: Vascular Surgery

## 2012-04-13 ENCOUNTER — Inpatient Hospital Stay (HOSPITAL_COMMUNITY): Payer: Medicare Other | Admitting: Vascular Surgery

## 2012-04-13 ENCOUNTER — Inpatient Hospital Stay (HOSPITAL_COMMUNITY)
Admission: RE | Admit: 2012-04-13 | Discharge: 2012-04-14 | DRG: 473 | Disposition: A | Discharge status: Home / Self Care | Payer: Medicare Other | Source: Ambulatory Visit | Attending: Neurosurgery | Admitting: Neurosurgery

## 2012-04-13 ENCOUNTER — Encounter (HOSPITAL_COMMUNITY): Payer: Self-pay | Admitting: Surgery

## 2012-04-13 ENCOUNTER — Encounter (HOSPITAL_COMMUNITY)
Admission: RE | Disposition: A | Discharge status: Home / Self Care | Payer: Self-pay | Source: Ambulatory Visit | Attending: Neurosurgery

## 2012-04-13 ENCOUNTER — Inpatient Hospital Stay (HOSPITAL_COMMUNITY): Payer: Medicare Other

## 2012-04-13 DIAGNOSIS — F3289 Other specified depressive episodes: Secondary | ICD-10-CM | POA: Diagnosis present

## 2012-04-13 DIAGNOSIS — Z01812 Encounter for preprocedural laboratory examination: Secondary | ICD-10-CM

## 2012-04-13 DIAGNOSIS — Z79899 Other long term (current) drug therapy: Secondary | ICD-10-CM

## 2012-04-13 DIAGNOSIS — M6250 Muscle wasting and atrophy, not elsewhere classified, unspecified site: Secondary | ICD-10-CM | POA: Diagnosis present

## 2012-04-13 DIAGNOSIS — M5010 Cervical disc disorder with radiculopathy, unspecified cervical region: Secondary | ICD-10-CM | POA: Diagnosis present

## 2012-04-13 DIAGNOSIS — M5020 Other cervical disc displacement, unspecified cervical region: Principal | ICD-10-CM | POA: Diagnosis present

## 2012-04-13 DIAGNOSIS — Z794 Long term (current) use of insulin: Secondary | ICD-10-CM

## 2012-04-13 DIAGNOSIS — F329 Major depressive disorder, single episode, unspecified: Secondary | ICD-10-CM | POA: Diagnosis present

## 2012-04-13 DIAGNOSIS — F17200 Nicotine dependence, unspecified, uncomplicated: Secondary | ICD-10-CM | POA: Diagnosis present

## 2012-04-13 DIAGNOSIS — Z23 Encounter for immunization: Secondary | ICD-10-CM

## 2012-04-13 DIAGNOSIS — E119 Type 2 diabetes mellitus without complications: Secondary | ICD-10-CM | POA: Diagnosis present

## 2012-04-13 HISTORY — PX: ANTERIOR CERVICAL DECOMP/DISCECTOMY FUSION: SHX1161

## 2012-04-13 LAB — GLUCOSE, CAPILLARY
Glucose-Capillary: 177 mg/dL — ABNORMAL HIGH (ref 70–99)
Glucose-Capillary: 329 mg/dL — ABNORMAL HIGH (ref 70–99)

## 2012-04-13 SURGERY — ANTERIOR CERVICAL DECOMPRESSION/DISCECTOMY FUSION 1 LEVEL
Anesthesia: General | Site: Spine Cervical | Wound class: Clean

## 2012-04-13 MED ORDER — METFORMIN HCL 850 MG PO TABS
850.0000 mg | ORAL_TABLET | Freq: Two times a day (BID) | ORAL | Status: DC
  Administered 2012-04-13 – 2012-04-14 (×2): 850 mg via ORAL
  Filled 2012-04-13 (×4): qty 1

## 2012-04-13 MED ORDER — SODIUM CHLORIDE 0.9 % IJ SOLN: 3.0000 mL | INTRAMUSCULAR | Status: DC | PRN

## 2012-04-13 MED ORDER — LACTATED RINGERS IV SOLN
INTRAVENOUS | Status: DC | PRN
  Administered 2012-04-13 (×2): via INTRAVENOUS

## 2012-04-13 MED ORDER — PIOGLITAZONE HCL 15 MG PO TABS
15.0000 mg | ORAL_TABLET | Freq: Two times a day (BID) | ORAL | Status: DC
  Administered 2012-04-13 – 2012-04-14 (×2): 15 mg via ORAL
  Filled 2012-04-13 (×4): qty 1

## 2012-04-13 MED ORDER — HEMOSTATIC AGENTS (NO CHARGE) OPTIME
TOPICAL | Status: DC | PRN
  Administered 2012-04-13: 1 via TOPICAL

## 2012-04-13 MED ORDER — PANCRELIPASE (LIP-PROT-AMYL) 12000-38000 UNITS PO CPEP
1.0000 | ORAL_CAPSULE | Freq: Two times a day (BID) | ORAL | Status: DC
  Filled 2012-04-13: qty 3

## 2012-04-13 MED ORDER — INSULIN GLARGINE 100 UNIT/ML ~~LOC~~ SOLN
70.0000 [IU] | Freq: Every day | SUBCUTANEOUS | Status: DC
  Administered 2012-04-13: 70 [IU] via SUBCUTANEOUS

## 2012-04-13 MED ORDER — SODIUM CHLORIDE 0.9 % IV SOLN
INTRAVENOUS | Status: AC
  Filled 2012-04-13: qty 500

## 2012-04-13 MED ORDER — PANCRELIPASE (LIP-PROT-AMYL) 12000-38000 UNITS PO CPEP
1.0000 | ORAL_CAPSULE | ORAL | Status: DC | PRN
  Filled 2012-04-13: qty 1

## 2012-04-13 MED ORDER — PROPOFOL 10 MG/ML IV BOLUS
INTRAVENOUS | Status: DC | PRN
  Administered 2012-04-13: 120 mg via INTRAVENOUS

## 2012-04-13 MED ORDER — MIDAZOLAM HCL 5 MG/5ML IJ SOLN
INTRAMUSCULAR | Status: DC | PRN
  Administered 2012-04-13: 2 mg via INTRAVENOUS

## 2012-04-13 MED ORDER — HYDROMORPHONE HCL PF 1 MG/ML IJ SOLN
INTRAMUSCULAR | Status: AC
  Administered 2012-04-13: 0.5 mg via INTRAVENOUS
  Filled 2012-04-13: qty 1

## 2012-04-13 MED ORDER — ONDANSETRON HCL 4 MG/2ML IJ SOLN: 4.0000 mg | INTRAMUSCULAR | Status: DC | PRN

## 2012-04-13 MED ORDER — EPHEDRINE SULFATE 50 MG/ML IJ SOLN
INTRAMUSCULAR | Status: DC | PRN
  Administered 2012-04-13 (×2): 5 mg via INTRAVENOUS

## 2012-04-13 MED ORDER — INSULIN ASPART 100 UNIT/ML ~~LOC~~ SOLN
3.0000 [IU] | Freq: Three times a day (TID) | SUBCUTANEOUS | Status: DC
  Administered 2012-04-14: 3 [IU] via SUBCUTANEOUS

## 2012-04-13 MED ORDER — PANTOPRAZOLE SODIUM 40 MG PO TBEC: 80.0000 mg | DELAYED_RELEASE_TABLET | Freq: Every day | ORAL | Status: DC

## 2012-04-13 MED ORDER — ALUM & MAG HYDROXIDE-SIMETH 200-200-20 MG/5ML PO SUSP: 30.0000 mL | Freq: Four times a day (QID) | ORAL | Status: DC | PRN

## 2012-04-13 MED ORDER — OXYCODONE HCL 5 MG/5ML PO SOLN: 5.0000 mg | Freq: Once | ORAL | Status: AC | PRN

## 2012-04-13 MED ORDER — SODIUM CHLORIDE 0.9 % IV SOLN: 250.0000 mL | INTRAVENOUS | Status: DC

## 2012-04-13 MED ORDER — GLYCOPYRROLATE 0.2 MG/ML IJ SOLN
INTRAMUSCULAR | Status: DC | PRN
  Administered 2012-04-13: .8 mg via INTRAVENOUS

## 2012-04-13 MED ORDER — FENTANYL CITRATE 0.05 MG/ML IJ SOLN
INTRAMUSCULAR | Status: DC | PRN
  Administered 2012-04-13: 150 ug via INTRAVENOUS
  Administered 2012-04-13: 50 ug via INTRAVENOUS

## 2012-04-13 MED ORDER — PIOGLITAZONE HCL-METFORMIN HCL 15-850 MG PO TABS: 1.0000 | ORAL_TABLET | Freq: Two times a day (BID) | ORAL | Status: DC

## 2012-04-13 MED ORDER — PHENYLEPHRINE HCL 10 MG/ML IJ SOLN
10.0000 mg | INTRAMUSCULAR | Status: DC | PRN
  Administered 2012-04-13: 20 ug/min via INTRAVENOUS

## 2012-04-13 MED ORDER — ONDANSETRON HCL 4 MG/2ML IJ SOLN
INTRAMUSCULAR | Status: DC | PRN
  Administered 2012-04-13: 4 mg via INTRAVENOUS

## 2012-04-13 MED ORDER — NEOSTIGMINE METHYLSULFATE 1 MG/ML IJ SOLN
INTRAMUSCULAR | Status: DC | PRN
  Administered 2012-04-13: 5 mg via INTRAVENOUS

## 2012-04-13 MED ORDER — INFLUENZA VIRUS VACC SPLIT PF IM SUSP
0.5000 mL | INTRAMUSCULAR | Status: DC
  Filled 2012-04-13: qty 0.5

## 2012-04-13 MED ORDER — SODIUM CHLORIDE 0.9 % IR SOLN
Status: DC | PRN
  Administered 2012-04-13: 12:00:00

## 2012-04-13 MED ORDER — PHENOL 1.4 % MT LIQD: 1.0000 | OROMUCOSAL | Status: DC | PRN

## 2012-04-13 MED ORDER — HYDROCODONE-ACETAMINOPHEN 5-325 MG PO TABS: 1.0000 | ORAL_TABLET | ORAL | Status: DC | PRN

## 2012-04-13 MED ORDER — CYCLOBENZAPRINE HCL 10 MG PO TABS
10.0000 mg | ORAL_TABLET | Freq: Three times a day (TID) | ORAL | Status: DC | PRN
  Administered 2012-04-13: 10 mg via ORAL
  Filled 2012-04-13: qty 1

## 2012-04-13 MED ORDER — OXYCODONE-ACETAMINOPHEN 5-325 MG PO TABS
1.0000 | ORAL_TABLET | ORAL | Status: DC | PRN
  Administered 2012-04-13 – 2012-04-14 (×3): 2 via ORAL
  Filled 2012-04-13 (×3): qty 2

## 2012-04-13 MED ORDER — DEXTROSE 5 % IV SOLN
INTRAVENOUS | Status: DC | PRN
  Administered 2012-04-13: 11:00:00 via INTRAVENOUS

## 2012-04-13 MED ORDER — POLYETHYLENE GLYCOL 3350 17 G PO PACK
17.0000 g | PACK | Freq: Every day | ORAL | Status: DC | PRN
  Filled 2012-04-13: qty 1

## 2012-04-13 MED ORDER — PREGABALIN 50 MG PO CAPS
150.0000 mg | ORAL_CAPSULE | Freq: Two times a day (BID) | ORAL | Status: DC
Start: 2012-04-13 — End: 2012-04-13

## 2012-04-13 MED ORDER — DULOXETINE HCL 60 MG PO CPEP
60.0000 mg | ORAL_CAPSULE | Freq: Every day | ORAL | Status: DC
  Filled 2012-04-13: qty 1

## 2012-04-13 MED ORDER — ROCURONIUM BROMIDE 100 MG/10ML IV SOLN
INTRAVENOUS | Status: DC | PRN
  Administered 2012-04-13: 50 mg via INTRAVENOUS

## 2012-04-13 MED ORDER — PHENYLEPHRINE HCL 10 MG/ML IJ SOLN
INTRAMUSCULAR | Status: DC | PRN
  Administered 2012-04-13 (×4): 40 ug via INTRAVENOUS

## 2012-04-13 MED ORDER — FLEET ENEMA 7-19 GM/118ML RE ENEM
1.0000 | ENEMA | Freq: Once | RECTAL | Status: AC | PRN
  Filled 2012-04-13: qty 1

## 2012-04-13 MED ORDER — PANCRELIPASE (LIP-PROT-AMYL) 12000-38000 UNITS PO CPEP
3.0000 | ORAL_CAPSULE | Freq: Three times a day (TID) | ORAL | Status: DC
  Administered 2012-04-13: 3 via ORAL
  Filled 2012-04-13 (×5): qty 3

## 2012-04-13 MED ORDER — OXYCODONE HCL 5 MG PO TABS
ORAL_TABLET | ORAL | Status: AC
  Filled 2012-04-13: qty 1

## 2012-04-13 MED ORDER — ACETAMINOPHEN 650 MG RE SUPP: 650.0000 mg | RECTAL | Status: DC | PRN

## 2012-04-13 MED ORDER — BACITRACIN 50000 UNITS IM SOLR
INTRAMUSCULAR | Status: AC
  Filled 2012-04-13: qty 1

## 2012-04-13 MED ORDER — BISACODYL 10 MG RE SUPP: 10.0000 mg | Freq: Every day | RECTAL | Status: DC | PRN

## 2012-04-13 MED ORDER — OXYCODONE HCL 5 MG PO TABS
5.0000 mg | ORAL_TABLET | Freq: Once | ORAL | Status: AC | PRN
  Administered 2012-04-13: 5 mg via ORAL

## 2012-04-13 MED ORDER — HYDROMORPHONE HCL PF 1 MG/ML IJ SOLN
0.5000 mg | INTRAMUSCULAR | Status: DC | PRN
  Administered 2012-04-13: 1 mg via INTRAVENOUS
  Filled 2012-04-13: qty 1

## 2012-04-13 MED ORDER — THROMBIN 5000 UNITS EX KIT
PACK | CUTANEOUS | Status: DC | PRN
  Administered 2012-04-13 (×2): 5000 [IU] via TOPICAL

## 2012-04-13 MED ORDER — MENTHOL 3 MG MT LOZG: 1.0000 | LOZENGE | OROMUCOSAL | Status: DC | PRN

## 2012-04-13 MED ORDER — SODIUM CHLORIDE 0.9 % IJ SOLN
3.0000 mL | Freq: Two times a day (BID) | INTRAMUSCULAR | Status: DC
  Administered 2012-04-13: 3 mL via INTRAVENOUS

## 2012-04-13 MED ORDER — PNEUMOCOCCAL VAC POLYVALENT 25 MCG/0.5ML IJ INJ
0.5000 mL | INJECTION | INTRAMUSCULAR | Status: DC
  Filled 2012-04-13: qty 0.5

## 2012-04-13 MED ORDER — PREGABALIN 50 MG PO CAPS
150.0000 mg | ORAL_CAPSULE | Freq: Two times a day (BID) | ORAL | Status: DC
  Administered 2012-04-13: 150 mg via ORAL
  Filled 2012-04-13: qty 3

## 2012-04-13 MED ORDER — OLOPATADINE HCL 0.2 % OP SOLN: 1.0000 [drp] | Freq: Every day | OPHTHALMIC | Status: DC | PRN

## 2012-04-13 MED ORDER — CEFAZOLIN SODIUM 1-5 GM-% IV SOLN
1.0000 g | Freq: Three times a day (TID) | INTRAVENOUS | Status: AC
  Administered 2012-04-13 – 2012-04-14 (×2): 1 g via INTRAVENOUS
  Filled 2012-04-13 (×2): qty 50

## 2012-04-13 MED ORDER — ONDANSETRON HCL 4 MG/2ML IJ SOLN: 4.0000 mg | Freq: Once | INTRAMUSCULAR | Status: DC | PRN

## 2012-04-13 MED ORDER — LIDOCAINE HCL (CARDIAC) 20 MG/ML IV SOLN
INTRAVENOUS | Status: DC | PRN
  Administered 2012-04-13: 100 mg via INTRAVENOUS

## 2012-04-13 MED ORDER — MEPERIDINE HCL 25 MG/ML IJ SOLN: 6.2500 mg | INTRAMUSCULAR | Status: DC | PRN

## 2012-04-13 MED ORDER — 0.9 % SODIUM CHLORIDE (POUR BTL) OPTIME
TOPICAL | Status: DC | PRN
  Administered 2012-04-13: 1000 mL

## 2012-04-13 MED ORDER — SENNA 8.6 MG PO TABS
1.0000 | ORAL_TABLET | Freq: Two times a day (BID) | ORAL | Status: DC
  Administered 2012-04-13: 8.6 mg via ORAL
  Filled 2012-04-13 (×3): qty 1

## 2012-04-13 MED ORDER — ACETAMINOPHEN 325 MG PO TABS: 650.0000 mg | ORAL_TABLET | ORAL | Status: DC | PRN

## 2012-04-13 MED ORDER — ZOLPIDEM TARTRATE 5 MG PO TABS
5.0000 mg | ORAL_TABLET | Freq: Every evening | ORAL | Status: DC | PRN
  Administered 2012-04-13: 5 mg via ORAL
  Filled 2012-04-13: qty 1

## 2012-04-13 MED ORDER — HYDROMORPHONE HCL PF 1 MG/ML IJ SOLN
0.2500 mg | INTRAMUSCULAR | Status: DC | PRN
  Administered 2012-04-13 (×4): 0.5 mg via INTRAVENOUS

## 2012-04-13 MED ORDER — PANTOPRAZOLE SODIUM 40 MG PO TBEC: 40.0000 mg | DELAYED_RELEASE_TABLET | Freq: Every day | ORAL | Status: DC

## 2012-04-13 SURGICAL SUPPLY — 55 items
APL SKNCLS STERI-STRIP NONHPOA (GAUZE/BANDAGES/DRESSINGS) ×1
BAG DECANTER FOR FLEXI CONT (MISCELLANEOUS) ×2 IMPLANT
BENZOIN TINCTURE PRP APPL 2/3 (GAUZE/BANDAGES/DRESSINGS) ×2 IMPLANT
BRUSH SCRUB EZ PLAIN DRY (MISCELLANEOUS) ×2 IMPLANT
BUR MATCHSTICK NEURO 3.0 LAGG (BURR) ×2 IMPLANT
CANISTER SUCTION 2500CC (MISCELLANEOUS) ×2 IMPLANT
CLOTH BEACON ORANGE TIMEOUT ST (SAFETY) ×2 IMPLANT
CONT SPEC 4OZ CLIKSEAL STRL BL (MISCELLANEOUS) ×2 IMPLANT
DRAPE C-ARM 42X72 X-RAY (DRAPES) ×4 IMPLANT
DRAPE LAPAROTOMY 100X72 PEDS (DRAPES) ×2 IMPLANT
DRAPE MICROSCOPE ZEISS OPMI (DRAPES) ×2 IMPLANT
DRAPE POUCH INSTRU U-SHP 10X18 (DRAPES) ×2 IMPLANT
DRILL BIT (BIT) ×2 IMPLANT
ELECT COATED BLADE 2.86 ST (ELECTRODE) ×2 IMPLANT
ELECT REM PT RETURN 9FT ADLT (ELECTROSURGICAL) ×2
ELECTRODE REM PT RTRN 9FT ADLT (ELECTROSURGICAL) ×1 IMPLANT
GAUZE SPONGE 4X4 16PLY XRAY LF (GAUZE/BANDAGES/DRESSINGS) IMPLANT
GLOVE BIOGEL M 8.0 STRL (GLOVE) ×1 IMPLANT
GLOVE BIOGEL PI IND STRL 7.0 (GLOVE) IMPLANT
GLOVE BIOGEL PI INDICATOR 7.0 (GLOVE) ×2
GLOVE ECLIPSE 8.5 STRL (GLOVE) ×2 IMPLANT
GLOVE EXAM NITRILE LRG STRL (GLOVE) IMPLANT
GLOVE EXAM NITRILE MD LF STRL (GLOVE) IMPLANT
GLOVE EXAM NITRILE XL STR (GLOVE) IMPLANT
GLOVE EXAM NITRILE XS STR PU (GLOVE) IMPLANT
GLOVE SS BIOGEL STRL SZ 6.5 (GLOVE) IMPLANT
GLOVE SUPERSENSE BIOGEL SZ 6.5 (GLOVE) ×2
GOWN BRE IMP SLV AUR LG STRL (GOWN DISPOSABLE) ×2 IMPLANT
GOWN BRE IMP SLV AUR XL STRL (GOWN DISPOSABLE) ×1 IMPLANT
GOWN STRL REIN 2XL LVL4 (GOWN DISPOSABLE) IMPLANT
HEAD HALTER (SOFTGOODS) ×2 IMPLANT
KIT BASIN OR (CUSTOM PROCEDURE TRAY) ×2 IMPLANT
KIT ROOM TURNOVER OR (KITS) ×2 IMPLANT
NDL SPNL 20GX3.5 QUINCKE YW (NEEDLE) ×1 IMPLANT
NEEDLE SPNL 20GX3.5 QUINCKE YW (NEEDLE) ×2 IMPLANT
NS IRRIG 1000ML POUR BTL (IV SOLUTION) ×2 IMPLANT
PACK LAMINECTOMY NEURO (CUSTOM PROCEDURE TRAY) ×2 IMPLANT
PAD ARMBOARD 7.5X6 YLW CONV (MISCELLANEOUS) ×6 IMPLANT
PLATE ELITE VISION 25MM (Plate) ×1 IMPLANT
RUBBERBAND STERILE (MISCELLANEOUS) ×4 IMPLANT
SCREW ST 13X4XST VA NS SPNE (Screw) IMPLANT
SCREW ST VAR 4 ATL (Screw) ×8 IMPLANT
SPACER BONE CORNERSTONE 7X14 (Orthopedic Implant) ×1 IMPLANT
SPONGE GAUZE 4X4 12PLY (GAUZE/BANDAGES/DRESSINGS) ×2 IMPLANT
SPONGE INTESTINAL PEANUT (DISPOSABLE) ×2 IMPLANT
SPONGE SURGIFOAM ABS GEL SZ50 (HEMOSTASIS) ×2 IMPLANT
STRIP CLOSURE SKIN 1/2X4 (GAUZE/BANDAGES/DRESSINGS) ×2 IMPLANT
SUT PDS AB 5-0 P3 18 (SUTURE) ×2 IMPLANT
SUT VIC AB 3-0 SH 8-18 (SUTURE) ×2 IMPLANT
SYR 20ML ECCENTRIC (SYRINGE) ×2 IMPLANT
TAPE CLOTH 4X10 WHT NS (GAUZE/BANDAGES/DRESSINGS) ×1 IMPLANT
TAPE CLOTH SURG 4X10 WHT LF (GAUZE/BANDAGES/DRESSINGS) ×1 IMPLANT
TOWEL OR 17X24 6PK STRL BLUE (TOWEL DISPOSABLE) ×2 IMPLANT
TOWEL OR 17X26 10 PK STRL BLUE (TOWEL DISPOSABLE) ×2 IMPLANT
WATER STERILE IRR 1000ML POUR (IV SOLUTION) ×2 IMPLANT

## 2012-04-13 NOTE — Anesthesia Postprocedure Evaluation (Signed)
Anesthesia Post Note  Patient: Christian Wall  Procedure(s) Performed: Procedure(s) (LRB): ANTERIOR CERVICAL DECOMPRESSION/DISCECTOMY FUSION 1 LEVEL (N/A)  Anesthesia type: general  Patient location: PACU  Post pain: Pain level controlled  Post assessment: Patient's Cardiovascular Status Stable  Last Vitals:  Filed Vitals:   04/13/12 1400  BP:   Pulse: 106  Temp:   Resp: 19    Post vital signs: Reviewed and stable  Level of consciousness: sedated  Complications: No apparent anesthesia complications

## 2012-04-13 NOTE — Anesthesia Procedure Notes (Signed)
Procedure Name: Intubation Date/Time: 04/13/2012 10:54 AM Performed by: Julianne Rice K Pre-anesthesia Checklist: Emergency Drugs available, Patient identified, Timeout performed, Suction available and Patient being monitored Patient Re-evaluated:Patient Re-evaluated prior to inductionOxygen Delivery Method: Circle system utilized Preoxygenation: Pre-oxygenation with 100% oxygen Intubation Type: IV induction Ventilation: Mask ventilation without difficulty Laryngoscope Size: Mac and 3 Grade View: Grade II Tube type: Oral Tube size: 8.0 mm Number of attempts: 1 Airway Equipment and Method: Stylet and LTA kit utilized Placement Confirmation: ETT inserted through vocal cords under direct vision,  breath sounds checked- equal and bilateral and positive ETCO2 Secured at: 26 cm Tube secured with: Tape Dental Injury: Teeth and Oropharynx as per pre-operative assessment

## 2012-04-13 NOTE — Preoperative (Signed)
Beta Blockers   Reason not to administer Beta Blockers:Not Applicable 

## 2012-04-13 NOTE — Brief Op Note (Signed)
04/13/2012  12:34 PM  PATIENT:  Christian Wall  51 y.o. male  PRE-OPERATIVE DIAGNOSIS:  HNP  POST-OPERATIVE DIAGNOSIS:  Herniated Nucleous Pulposus  PROCEDURE:  Procedure(s) (LRB) with comments: ANTERIOR CERVICAL DECOMPRESSION/DISCECTOMY FUSION 1 LEVEL (N/A) - Cervcial Five-Six Anterior Cervical Decompression and Fusion with Allograft and Plating  SURGEON:  Surgeon(s) and Role:    * Temple Pacini, MD - Primary  PHYSICIAN ASSISTANT:   ASSISTANTS: [place]    ANESTHESIA:   general  EBL:  Total I/O In: 1550 [I.V.:1550] Out: 75 [Blood:75]  BLOOD ADMINISTERED:none  DRAINS: none   LOCAL MEDICATIONS USED:  NONE  SPECIMEN:  No Specimen  DISPOSITION OF SPECIMEN:  N/A  COUNTS:  YES  TOURNIQUET:  * No tourniquets in log *  DICTATION: .Dragon Dictation  PLAN OF CARE: Admit to inpatient   PATIENT DISPOSITION:  PACU - hemodynamically stable.   Delay start of Pharmacological VTE agent (>24hrs) due to surgical blood loss or risk of bleeding: yes

## 2012-04-13 NOTE — Progress Notes (Signed)
UR COMPLETED  

## 2012-04-13 NOTE — H&P (Signed)
Christian Wall is an 51 y.o. male.   Chief Complaint: Neck and left arm pain. HPI: 51 year old male with severe neck and left upper extremity pain consistent with a left C6 radiculopathy failing conservative measure workup demonstrates evidence of a large left-sided C5-6 disc herniation with compression of the spinal cord and exiting left-sided C6 nerve root. Patient presents now for anterior cervical discectomy and fusion with allograft and her plating in hopes of improving his symptoms. He has no significant right-sided symptoms. 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  Past Medical History  Diagnosis Date  . Diabetes mellitus   . Atrophy of calf muscles   . Depression     Past Surgical History  Procedure Date  . Pancreas surgery     History reviewed. No pertinent family history. Social History:  reports that he has been smoking Cigarettes.  He has a 30 pack-year smoking history. He has never used smokeless tobacco. He reports that he does not drink alcohol or use illicit drugs.  Allergies: No Known Allergies  Medications Prior to Admission  Medication Sig Dispense Refill  . DULoxetine (CYMBALTA) 60 MG capsule Take 60 mg by mouth daily.      Marland Kitchen esomeprazole (NEXIUM) 40 MG capsule Take 40 mg by mouth daily before breakfast.      . insulin aspart (NOVOLOG) 100 UNIT/ML injection Inject 3-6 Units into the skin 3 (three) times daily before meals. Per sliding scale depending on what he eats      . insulin glargine (LANTUS) 100 UNIT/ML injection Inject 70 Units into the skin at bedtime.      . lipase/protease/amylase (CREON-10/PANCREASE) 12000 UNITS CPEP Take 1-3 capsules by mouth 2 (two) times daily. 3 capsules before meal, and 1 capsule before snack      . Olopatadine HCl (PATADAY) 0.2 % SOLN Apply 1 drop to eye daily as needed. For allergies      . pioglitazone-metformin (ACTOPLUS MET) 15-850 MG  per tablet Take 1 tablet by mouth 2 (two) times daily with a meal.      . pregabalin (LYRICA) 150 MG capsule Take 150 mg by mouth 2 (two) times daily.      Marland Kitchen zolpidem (AMBIEN) 5 MG tablet Take 1 tablet (5 mg total) by mouth at bedtime as needed for sleep.  12 tablet  0    Results for orders placed during the hospital encounter of 04/13/12 (from the past 48 hour(s))  GLUCOSE, CAPILLARY     Status: Normal   Collection Time   04/13/12  8:43 AM      Component Value Range Comment   Glucose-Capillary 90  70 - 99 mg/dL    No results found.  Review of Systems  Constitutional: Negative.   HENT: Negative.   Eyes: Negative.   Respiratory: Negative.   Cardiovascular: Negative.   Gastrointestinal: Negative.   Genitourinary: Negative.   Musculoskeletal: Negative.   Skin: Negative.   Neurological: Negative.   Endo/Heme/Allergies: Negative.   Psychiatric/Behavioral: Negative.     Blood pressure 124/78, temperature 98 F (36.7 C), temperature source Oral, resp. rate 2, SpO2 96.00%. Physical Exam  Constitutional: He is oriented to person, place, and time. He appears well-developed and well-nourished.  HENT:  Head: Normocephalic and atraumatic.  Right Ear: External ear normal.  Left Ear: External ear normal.  Nose: Nose normal.  Mouth/Throat: Oropharynx is clear and moist.  Eyes: Conjunctivae normal and EOM are normal.  Pupils are equal, round, and reactive to light. Right eye exhibits no discharge. Left eye exhibits no discharge.  Neck: Normal range of motion. Neck supple. No tracheal deviation present. No thyromegaly present.  Cardiovascular: Normal rate, regular rhythm, normal heart sounds and intact distal pulses.  Exam reveals no friction rub.   No murmur heard. Respiratory: Effort normal and breath sounds normal. No respiratory distress. He has no wheezes.  GI: Soft. Bowel sounds are normal. He exhibits no distension. There is no tenderness.  Musculoskeletal: Normal range of motion. He  exhibits no edema and no tenderness.  Neurological: He is alert and oriented to person, place, and time. He has normal reflexes. He displays normal reflexes. No cranial nerve deficit. He exhibits normal muscle tone. Coordination normal.       Bilateral lower extremity paraparesis, chronic  Skin: Skin is warm and dry. No rash noted. No erythema. No pallor.  Psychiatric: He has a normal mood and affect. His behavior is normal. Judgment and thought content normal.     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 Plan C5-6 anterior cervical discectomy and fusion with allograft and anterior plating. Risks and benefits been explained. Patient wishes to proceed.  Caitlen Worth A 04/13/2012, 10:18 AM

## 2012-04-13 NOTE — Op Note (Signed)
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  Postoperative diagnosis: Same  Procedure Name: C5-6 anterior cervical discectomy and fusion with allograft and anterior plating  Surgeon:Thuan Tippett A.Tonantzin Mimnaugh, M.D.  Asst. Surgeon: Jeral Fruit  Anesthesia: General  Indication: 51 year old male with severe neck and left upper trimming pain paresthesias and weakness consistent with a left-sided C6 radiculopathy. Workup demonstrates evidence of a large left-sided C5-6 disc herniation with compression the spinal cord and exiting C6 nerve root. Patient presents now for anterior cervical decompression infusion in hopes of improving his symptoms.  Operative note: After induction of anesthesia, patient positioned supine with neck slightly extended and held in place of halter traction. Patient's anterior cervical region prepped and draped sterilely. Incision made overlying the C5-6 interspace. Dissection proceeded on the medial border of the sternocleidomastoid muscle and carotid sheath. Trachea and esophagus mobilized track towards the left. Prevertebral fascia stripped off the anterior spinal column. Longus colli muscles elevated bilaterally using electrocautery. Deep self-retaining traction placed intraoperative fluoroscopy used levels was confirmed. The space and size 15 blade. Aggressive discectomy then performed using pituitary rongeurs forward and backward angle Carlen curettes Kerrison or his high-speed drill to remove all elements of the disc down to the level of the posterior annulus. Microscope and brought field used throughout the remainder of the discectomy. Remaining aspects of annulus and osteophytes removed using a high-speed drill down to the level posterior longitudinal ligament. Posterior lateral and was elevated and resected piecemeal fashion using Kerrison rongeurs. Underlying thecal sac  was identified. Y. center decompression and perform bradycardia the bodies of C5 and C6. Decompression MCH are of foramen. Wide anterior foraminotomies form of course exiting C6 nerve roots bilaterally. All elements the disc herniation were completely resected off on the left side. There is no his injury to thecal sac or nerve roots. Wound is then irrigated. A 7 mm cornerstone allograft wedges and packed into place recessed roughly 1 mm from the intervertebral margin. 25 mm Atlantis anterior full plate was then placed over the C5 and C6 levels. This infection or fluoroscopic guidance and 13 over variable angle screws. All 4 screws and final tightening be solidly within bone. Locking screws engaged both levels. Final images real good position the rest and hardware at the proper upper level at with normal lamina spine. Wound is then irrigated and bike solution. Hemostasis was assured a large cautery. Wounds and closed in layers. Steri-Strips and a sterile dressing were applied. There were no apparent complications. Patient tolerated procedure well and returns to the recovery room postop .

## 2012-04-13 NOTE — Anesthesia Preprocedure Evaluation (Addendum)
Anesthesia Evaluation  Patient identified by MRN, date of birth, ID band Patient awake    Reviewed: Allergy & Precautions, H&P , NPO status , Patient's Chart, lab work & pertinent test results  Airway Mallampati: I TM Distance: >3 FB Neck ROM: Full    Dental   Pulmonary Current Smoker,          Cardiovascular negative cardio ROS      Neuro/Psych PSYCHIATRIC DISORDERS Depression  Neuromuscular disease    GI/Hepatic negative GI ROS, Neg liver ROS,   Endo/Other  diabetes, Well Controlled, Type 2, Insulin Dependent and Oral Hypoglycemic Agents  Renal/GU negative Renal ROS     Musculoskeletal negative musculoskeletal ROS (+)   Abdominal   Peds  Hematology negative hematology ROS (+)   Anesthesia Other Findings   Reproductive/Obstetrics                          Anesthesia Physical Anesthesia Plan  ASA: II  Anesthesia Plan: General   Post-op Pain Management:    Induction: Intravenous  Airway Management Planned: Oral ETT  Additional Equipment:   Intra-op Plan:   Post-operative Plan: Extubation in OR  Informed Consent: I have reviewed the patients History and Physical, chart, labs and discussed the procedure including the risks, benefits and alternatives for the proposed anesthesia with the patient or authorized representative who has indicated his/her understanding and acceptance.   Dental advisory given  Plan Discussed with: Surgeon and CRNA  Anesthesia Plan Comments:        Anesthesia Quick Evaluation

## 2012-04-13 NOTE — Transfer of Care (Signed)
Immediate Anesthesia Transfer of Care Note  Patient: Christian Wall  Procedure(s) Performed: Procedure(s) (LRB) with comments: ANTERIOR CERVICAL DECOMPRESSION/DISCECTOMY FUSION 1 LEVEL (N/A) - Cervcial Five-Six Anterior Cervical Decompression and Fusion with Allograft and Plating  Patient Location: PACU  Anesthesia Type: General  Level of Consciousness: awake, oriented, patient cooperative and responds to stimulation  Airway & Oxygen Therapy: Patient Spontanous Breathing and Patient connected to nasal cannula oxygen  Post-op Assessment: Report given to PACU RN, Post -op Vital signs reviewed and stable and Patient moving all extremities X 4  Post vital signs: Reviewed and stable  Complications: No apparent anesthesia complications

## 2012-04-14 LAB — GLUCOSE, CAPILLARY

## 2012-04-14 MED ORDER — CYCLOBENZAPRINE HCL 10 MG PO TABS: 10.0000 mg | ORAL_TABLET | Freq: Three times a day (TID) | ORAL | Status: DC | PRN

## 2012-04-14 MED ORDER — HYDROCODONE-ACETAMINOPHEN 5-325 MG PO TABS: 1.0000 | ORAL_TABLET | ORAL | Status: DC | PRN

## 2012-04-14 NOTE — Discharge Summary (Signed)
Physician Discharge Summary  Patient ID: Heshy Aftab MRN: [id number] DOB/AGE: January 07, 1961 51 y.o.  Admit date: 04/13/2012 Discharge date: 04/14/2012  Admission Diagnoses:  Discharge Diagnoses:  Principal Problem:  *Herniation of cervical intervertebral disc with radiculopathy   Discharged Condition: good  Hospital Course: Patient mid-to the hospital where he underwent an uncomplicated C5-6 anterior cervical discectomy and fusion with allograft and her plating. Postoperatively he is done well. He said resolution of his neck and upper trimming pain. Strength sensation are are stable. Wound healing well. Ready for discharge home.  Consults:   Significant Diagnostic Studies:   Treatments:   Discharge Exam: Blood pressure 146/93, pulse 100, temperature 98.3 F (36.8 C), temperature source Oral, resp. rate 16, SpO2 99.00%. awake and alert oriented and appropriate. Cranial nerve function is intact. Motor examination 5 over 5 in both upper trimming his 4 minus over 5 in both lower trimming his which is chronic. Wound clean dry and intact. Chest abdomen benign.   Disposition: 01-Home or Self Care     Medication List     As of 04/14/2012  7:43 AM    STOP taking these medications         DULoxetine 60 MG capsule   Commonly known as: CYMBALTA      TAKE these medications         cyclobenzaprine 10 MG tablet   Commonly known as: FLEXERIL   Take 1 tablet (10 mg total) by mouth 3 (three) times daily as needed for muscle spasms.      esomeprazole 40 MG capsule   Commonly known as: NEXIUM   Take 40 mg by mouth daily before breakfast.      HYDROcodone-acetaminophen 5-325 MG per tablet   Commonly known as: NORCO/VICODIN   Take 1-2 tablets by mouth every 4 (four) hours as needed.      insulin aspart 100 UNIT/ML injection   Commonly known as: novoLOG   Inject 3-6 Units into the skin 3 (three) times daily before meals. Per sliding scale depending on what he eats      insulin  glargine 100 UNIT/ML injection   Commonly known as: LANTUS   Inject 70 Units into the skin at bedtime.      lipase/protease/amylase 08657 UNITS Cpep   Commonly known as: CREON-10/PANCREASE   Take 1-3 capsules by mouth 2 (two) times daily. 3 capsules before meal, and 1 capsule before snack      PATADAY 0.2 % Soln   Generic drug: Olopatadine HCl   Apply 1 drop to eye daily as needed. For allergies      pioglitazone-metformin 15-850 MG per tablet   Commonly known as: ACTOPLUS MET   Take 1 tablet by mouth 2 (two) times daily with a meal.      pregabalin 150 MG capsule   Commonly known as: LYRICA   Take 150 mg by mouth 2 (two) times daily.      zolpidem 5 MG tablet   Commonly known as: AMBIEN   Take 1 tablet (5 mg total) by mouth at bedtime as needed for sleep.           Follow-up Information    Follow up with Velisa Regnier A, MD. Call in 1 week. (Ask for Lurena Joiner)    Contact information:   [address] [place] [address] [phone]          Signed: Iver Miklas A 04/14/2012, 7:43 AM

## 2012-04-14 NOTE — Progress Notes (Signed)
Orthopedic Tech Progress Note Patient Details:  Christian Wall April 25, 1961 [id number]  Patient ID: Christian Wall, male   DOB: 05/23/1961, 51 y.o.   MRN: [id number]   [name] 04/14/2012, 8:09 AM SOFT COLLAR COMPLETED BY BIO-TECH

## 2012-04-14 NOTE — Progress Notes (Signed)
Pt given D/C instructions with Rx. Pt verbalized understanding of teaching. Pt D/C'd home with wife per MD order. Rema Fendt, RN

## 2012-04-16 ENCOUNTER — Observation Stay (HOSPITAL_COMMUNITY)
Admission: EM | Admit: 2012-04-16 | Discharge: 2012-04-17 | Disposition: A | Discharge status: Home Health | Payer: Medicare Other | Attending: Emergency Medicine | Admitting: Emergency Medicine

## 2012-04-16 ENCOUNTER — Encounter (HOSPITAL_COMMUNITY): Payer: Self-pay | Admitting: Neurosurgery

## 2012-04-16 DIAGNOSIS — E860 Dehydration: Secondary | ICD-10-CM

## 2012-04-16 DIAGNOSIS — Z9889 Other specified postprocedural states: Secondary | ICD-10-CM | POA: Insufficient documentation

## 2012-04-16 DIAGNOSIS — M542 Cervicalgia: Secondary | ICD-10-CM | POA: Insufficient documentation

## 2012-04-16 DIAGNOSIS — F17200 Nicotine dependence, unspecified, uncomplicated: Secondary | ICD-10-CM | POA: Insufficient documentation

## 2012-04-16 DIAGNOSIS — Z79899 Other long term (current) drug therapy: Secondary | ICD-10-CM | POA: Insufficient documentation

## 2012-04-16 DIAGNOSIS — E119 Type 2 diabetes mellitus without complications: Secondary | ICD-10-CM | POA: Insufficient documentation

## 2012-04-16 DIAGNOSIS — R112 Nausea with vomiting, unspecified: Principal | ICD-10-CM | POA: Insufficient documentation

## 2012-04-16 DIAGNOSIS — F3289 Other specified depressive episodes: Secondary | ICD-10-CM | POA: Insufficient documentation

## 2012-04-16 DIAGNOSIS — F329 Major depressive disorder, single episode, unspecified: Secondary | ICD-10-CM | POA: Insufficient documentation

## 2012-04-16 DIAGNOSIS — Z794 Long term (current) use of insulin: Secondary | ICD-10-CM | POA: Insufficient documentation

## 2012-04-16 DIAGNOSIS — R1110 Vomiting, unspecified: Secondary | ICD-10-CM

## 2012-04-16 HISTORY — DX: Gastro-esophageal reflux disease without esophagitis: K21.9

## 2012-04-16 HISTORY — DX: Pure hypercholesterolemia, unspecified: E78.00

## 2012-04-16 HISTORY — DX: Polyneuropathy, unspecified: G62.9

## 2012-04-16 LAB — COMPREHENSIVE METABOLIC PANEL
ALT: 19 U/L (ref 0–53)
BUN: 17 mg/dL (ref 6–23)
Calcium: 10.9 mg/dL — ABNORMAL HIGH (ref 8.4–10.5)
Creatinine, Ser: 1.05 mg/dL (ref 0.50–1.35)
GFR calc Af Amer: >90 mL/min (ref >90)
GFR calc non Af Amer: 80 mL/min — ABNORMAL LOW (ref >90)
Glucose, Bld: 291 mg/dL — ABNORMAL HIGH (ref 70–99)
Sodium: 140 mEq/L (ref 135–145)
Total Protein: 8.3 g/dL (ref 6.0–8.3)

## 2012-04-16 LAB — CBC
Hemoglobin: 12.8 g/dL — ABNORMAL LOW (ref 13.0–17.0)
MCH: 25 pg — ABNORMAL LOW (ref 26.0–34.0)
MCHC: 32.8 g/dL (ref 30.0–36.0)
MCV: 76 fL — ABNORMAL LOW (ref 78.0–100.0)

## 2012-04-16 LAB — LIPASE, BLOOD: Lipase: 7 U/L — ABNORMAL LOW (ref 11–59)

## 2012-04-16 MED ORDER — INSULIN ASPART 100 UNIT/ML ~~LOC~~ SOLN
4.0000 [IU] | Freq: Once | SUBCUTANEOUS | Status: AC
  Administered 2012-04-16: 4 [IU] via SUBCUTANEOUS
  Filled 2012-04-16: qty 1

## 2012-04-16 MED ORDER — ONDANSETRON HCL 4 MG/2ML IJ SOLN
4.0000 mg | Freq: Once | INTRAMUSCULAR | Status: AC
  Administered 2012-04-16: 4 mg via INTRAVENOUS
  Filled 2012-04-16: qty 2

## 2012-04-16 MED ORDER — SODIUM CHLORIDE 0.9 % IV SOLN
1000.0000 mL | INTRAVENOUS | Status: DC
  Administered 2012-04-17: 1000 mL via INTRAVENOUS

## 2012-04-16 MED ORDER — HYDROMORPHONE HCL PF 1 MG/ML IJ SOLN
1.0000 mg | Freq: Once | INTRAMUSCULAR | Status: AC
  Administered 2012-04-16: 1 mg via INTRAVENOUS
  Filled 2012-04-16: qty 1

## 2012-04-16 MED ORDER — SODIUM CHLORIDE 0.9 % IV SOLN
1000.0000 mL | Freq: Once | INTRAVENOUS | Status: AC
  Administered 2012-04-16: 1000 mL via INTRAVENOUS

## 2012-04-16 NOTE — ED Notes (Signed)
Placed on 2 LNC for RA sats 87.

## 2012-04-16 NOTE — ED Notes (Signed)
Patient had neck surgery on 10/8 and today he started vomiting.  Patient is unable to keep anything down.  Patient also c/o burning in his stomach.

## 2012-04-16 NOTE — ED Notes (Signed)
Pt states he was discharged from [facility]  On 10.8.13 for  Cervical surgery by Dr Dutch Quint.  Started vomiting  On 10.9.13 and has not stopped .  States 10-20 emesis bouts/day.  Also c/o epigastric pain rated 8/10 intermittent and burning.  States posterior neck pain 9/10.  Has not been taking pain meds due to emesis.  Has not notified Dr Ethelene Browns office of status.  Soft cervical collar in place. Anterior cervical sutures intact.  Dressing dry.

## 2012-04-17 ENCOUNTER — Encounter (HOSPITAL_COMMUNITY): Payer: Self-pay | Admitting: Internal Medicine

## 2012-04-17 ENCOUNTER — Observation Stay (HOSPITAL_COMMUNITY): Payer: Medicare Other

## 2012-04-17 LAB — BASIC METABOLIC PANEL
Chloride: 97 mEq/L (ref 96–112)
GFR calc Af Amer: >90 mL/min (ref >90)
GFR calc non Af Amer: >90 mL/min (ref >90)
Potassium: 4.3 mEq/L (ref 3.5–5.1)
Sodium: 136 mEq/L (ref 135–145)

## 2012-04-17 LAB — GLUCOSE, CAPILLARY
Glucose-Capillary: 223 mg/dL — ABNORMAL HIGH (ref 70–99)
Glucose-Capillary: 182 mg/dL — ABNORMAL HIGH (ref 70–99)
Glucose-Capillary: 231 mg/dL — ABNORMAL HIGH (ref 70–99)

## 2012-04-17 LAB — CBC WITH DIFFERENTIAL/PLATELET
Basophils Absolute: 0 10*3/uL (ref 0.0–0.1)
Basophils Relative: 0 % (ref 0–1)
Hemoglobin: 11.3 g/dL — ABNORMAL LOW (ref 13.0–17.0)
MCHC: 31.7 g/dL (ref 30.0–36.0)
Neutro Abs: 7.4 10*3/uL (ref 1.7–7.7)
Neutrophils Relative %: 69 % (ref 43–77)
Platelets: 231 10*3/uL (ref 150–400)
RDW: 15.1 % (ref 11.5–15.5)

## 2012-04-17 LAB — URINALYSIS, ROUTINE W REFLEX MICROSCOPIC
Ketones, ur: 15 mg/dL — AB
Leukocytes, UA: NEGATIVE
Nitrite: NEGATIVE
Specific Gravity, Urine: 1.023 (ref 1.005–1.030)
Urobilinogen, UA: 1 mg/dL (ref 0.0–1.0)
pH: 5.5 (ref 5.0–8.0)

## 2012-04-17 LAB — LACTIC ACID, PLASMA: Lactic Acid, Venous: 0.7 mmol/L (ref 0.5–2.2)

## 2012-04-17 MED ORDER — HYDROMORPHONE HCL PF 1 MG/ML IJ SOLN
1.0000 mg | Freq: Once | INTRAMUSCULAR | Status: AC
  Administered 2012-04-17: 1 mg via INTRAVENOUS
  Filled 2012-04-17: qty 1

## 2012-04-17 MED ORDER — ONDANSETRON HCL 4 MG/2ML IJ SOLN: 4.0000 mg | Freq: Three times a day (TID) | INTRAMUSCULAR | Status: DC | PRN

## 2012-04-17 MED ORDER — METOCLOPRAMIDE HCL 5 MG/ML IJ SOLN
10.0000 mg | Freq: Once | INTRAMUSCULAR | Status: AC
  Administered 2012-04-17: 10 mg via INTRAVENOUS
  Filled 2012-04-17: qty 2

## 2012-04-17 MED ORDER — DOCUSATE SODIUM 100 MG PO CAPS: 100.0000 mg | ORAL_CAPSULE | Freq: Two times a day (BID) | ORAL | Status: DC

## 2012-04-17 MED ORDER — HYDROMORPHONE HCL PF 1 MG/ML IJ SOLN
1.0000 mg | INTRAMUSCULAR | Status: DC | PRN
  Administered 2012-04-17 (×3): 1 mg via INTRAVENOUS
  Filled 2012-04-17 (×4): qty 1

## 2012-04-17 MED ORDER — SODIUM CHLORIDE 0.9 % IV SOLN
INTRAVENOUS | Status: DC
  Administered 2012-04-17: 09:00:00 via INTRAVENOUS

## 2012-04-17 MED ORDER — METOCLOPRAMIDE HCL 10 MG PO TABS: 10.0000 mg | ORAL_TABLET | Freq: Four times a day (QID) | ORAL | Status: DC

## 2012-04-17 MED ORDER — METOCLOPRAMIDE HCL 5 MG/ML IJ SOLN
10.0000 mg | Freq: Four times a day (QID) | INTRAMUSCULAR | Status: DC | PRN
  Administered 2012-04-17 (×2): 10 mg via INTRAVENOUS
  Filled 2012-04-17 (×2): qty 2

## 2012-04-17 MED ORDER — FAMOTIDINE IN NACL 20-0.9 MG/50ML-% IV SOLN
20.0000 mg | Freq: Once | INTRAVENOUS | Status: AC
  Administered 2012-04-17: 20 mg via INTRAVENOUS
  Filled 2012-04-17: qty 50

## 2012-04-17 MED ORDER — ACETAMINOPHEN 325 MG PO TABS
650.0000 mg | ORAL_TABLET | Freq: Once | ORAL | Status: AC
  Administered 2012-04-17: 650 mg via ORAL
  Filled 2012-04-17: qty 2

## 2012-04-17 MED ORDER — HYDROMORPHONE HCL PF 1 MG/ML IJ SOLN
1.0000 mg | Freq: Once | INTRAMUSCULAR | Status: AC
  Administered 2012-04-17: 1 mg via INTRAVENOUS

## 2012-04-17 NOTE — ED Notes (Signed)
Med given for neck pain rated at 8/10

## 2012-04-17 NOTE — ED Notes (Signed)
Pt given ice chips per NP order.  Did not toleraet and is actively vomiting

## 2012-04-17 NOTE — ED Notes (Signed)
Pt states he can not walk with out his 2 canes, is attempting to call ride

## 2012-04-17 NOTE — ED Notes (Signed)
Report given to Fannie Knee, RN in CDU.  Pt transported to CDU 3 via stretcher

## 2012-04-17 NOTE — ED Provider Notes (Signed)
Patient here for dehydration protocol. Patient given bolus X 2 and 156ml/hr X 2. Tachycardia has not improved. Per Dierdre Highman patient must be admitted to medicine unassigned.   Medicine consulted and requested repeat CBC, BMP, UA, and CXR. Dr. Pollie Meyer saw patient with recommendations to control his pain and continue fluids and he can be discharged home with Zofran and bowel regimen.   Patient care signed out to Remi Haggard, NP.   DG Chest 2 View (Final result)   Result time:04/17/12 1133    Final result by Rad Results In Interface (04/17/12 11:33:08)    Narrative:   *RADIOLOGY REPORT*  Clinical Data: Vomiting for 2 days  CHEST - 2 VIEW  Comparison: 04/09/2012  Findings: Heart size and vascular pattern are normal. There is a well-defined interstitial opacity in the bilateral mid lung zone. There are no pleural effusions. There is persistent density in the medial right lung base.  IMPRESSION: Persistent density cardiophrenic angle on the right. New bilateral perihilar interstitial opacities. These could represent areas of atelectasis. Superimposed pneumonia is not excluded.   Original Report Authenticated By: Otilio Carpen, M.D.           Pixie Casino, PA-C 04/17/12 1525

## 2012-04-17 NOTE — ED Notes (Signed)
CBG- 182 

## 2012-04-17 NOTE — ED Provider Notes (Signed)
History     CSN: [id number]  Arrival date & time 04/16/12  2027   First MD Initiated Contact with Patient 04/16/12 2221      Chief Complaint  Patient presents with  . Emesis    (Consider location/radiation/quality/duration/timing/severity/associated sxs/prior treatment) Patient is a 51 y.o. male presenting with vomiting. The history is provided by the patient and a parent. No language interpreter was used.  Emesis  This is a recurrent problem. The current episode started 12 to 24 hours ago. The problem has not changed since onset.The emesis has an appearance of stomach contents. There has been no fever. Pertinent negatives include no abdominal pain, no chills, no diarrhea and no fever.   51 year old male here 3 days after anterior cervical discectomy by Dr. Dutch Quint. Earlier today patient began vomiting and cannot keep his pain medications down. Patient did not take his insulin today. Patient has no antiemetics at home. He was on Percocet at home and cannot keep the medication down. Patient has no neuro symptoms or weakness. States that prior to surgery he does have some numbness but he does not have any numbness now. Denies pain patient is a diabetic and his sugar is elevated. Past medical history list below. Patient is a smoker.  Past Medical History  Diagnosis Date  . Diabetes mellitus   . Atrophy of calf muscles   . Depression     Past Surgical History  Procedure Date  . Pancreas surgery   . Anterior cervical decomp/discectomy fusion 04/13/2012    Procedure: ANTERIOR CERVICAL DECOMPRESSION/DISCECTOMY FUSION 1 LEVEL;  Surgeon: Temple Pacini, MD;  Location: [facility];  Service: Neurosurgery;  Laterality: N/A;  Cervcial Five-Six Anterior Cervical Decompression and Fusion with Allograft and Plating    No family history on file.  History  Substance Use Topics  . Smoking status: Current Every Day Smoker -- 1.0 packs/day for 30 years    Types: Cigarettes  . Smokeless tobacco:  Never Used  . Alcohol Use: No      Review of Systems  Constitutional: Negative.  Negative for fever and chills.  HENT: Positive for neck pain. Negative for neck stiffness.   Eyes: Negative.   Respiratory: Negative.  Negative for shortness of breath.   Cardiovascular: Negative.  Negative for leg swelling.  Gastrointestinal: Positive for nausea and vomiting. Negative for abdominal pain, diarrhea and blood in stool.  Neurological: Negative.  Negative for dizziness, weakness, light-headedness and numbness.  Psychiatric/Behavioral: Negative.   All other systems reviewed and are negative.    Allergies  Review of patient's allergies indicates no known allergies.  Home Medications   Current Outpatient Rx  Name Route Sig Dispense Refill  . CYCLOBENZAPRINE HCL 10 MG PO TABS Oral Take 1 tablet (10 mg total) by mouth 3 (three) times daily as needed for muscle spasms. 30 tablet 1  . ESOMEPRAZOLE MAGNESIUM 40 MG PO CPDR Oral Take 40 mg by mouth daily before breakfast.    . HYDROCODONE-ACETAMINOPHEN 5-325 MG PO TABS Oral Take 1-2 tablets by mouth every 4 (four) hours as needed. For pain    . INSULIN ASPART 100 UNIT/ML [place] SOLN Subcutaneous Inject 3-6 Units into the skin 3 (three) times daily before meals. Per sliding scale depending on what he eats    . INSULIN GLARGINE 100 UNIT/ML  SOLN Subcutaneous Inject 70 Units into the skin at bedtime.    Marland Kitchen PANCRELIPASE (LIP-PROT-AMYL) 12000 UNITS PO CPEP Oral Take 1-3 capsules by mouth 2 (two) times daily. 3 capsules  before meal, and 1 capsule before snack    . OLOPATADINE HCL 0.2 % OP SOLN Ophthalmic Apply 1 drop to eye daily as needed. For allergies    . PIOGLITAZONE HCL-METFORMIN HCL 15-850 MG PO TABS Oral Take 1 tablet by mouth 2 (two) times daily with a meal.    . PREGABALIN 150 MG PO CAPS Oral Take 150 mg by mouth 2 (two) times daily.    Marland Kitchen ZOLPIDEM TARTRATE 5 MG PO TABS Oral Take 1 tablet (5 mg total) by mouth at bedtime as needed for sleep. 12  tablet 0    BP 163/89  Pulse 122  Temp 98.3 F (36.8 C) (Oral)  Resp 20  SpO2 98%  Physical Exam  Nursing note and vitals reviewed. Constitutional: He is oriented to person, place, and time. He appears well-developed and well-nourished.  HENT:  Head: Normocephalic.  Eyes: Conjunctivae normal and EOM are normal. Pupils are equal, round, and reactive to light.  Neck: Normal range of motion. Neck supple.  Cardiovascular: Normal rate.   Pulmonary/Chest: Effort normal and breath sounds normal. No respiratory distress. He has no wheezes.  Abdominal: Soft. Bowel sounds are normal. He exhibits no distension. There is tenderness.       General tenderness/ epigastic tender.  Musculoskeletal: Normal range of motion.  Neurological: He is alert and oriented to person, place, and time. He has normal strength. No cranial nerve deficit or sensory deficit. Coordination normal. GCS eye subscore is 4. GCS verbal subscore is 5. GCS motor subscore is 6.       Normal strength and good sensation to the bilateral upper extremities.   Skin: Skin is warm and dry.  Psychiatric: He has a normal mood and affect.    ED Course  Procedures (including critical care time)  Labs Reviewed  CBC - Abnormal; Notable for the following:    WBC 12.3 (*)     Hemoglobin 12.8 (*)     MCV 76.0 (*)     MCH 25.0 (*)     All other components within normal limits  COMPREHENSIVE METABOLIC PANEL - Abnormal; Notable for the following:    Chloride 92 (*)     CO2 33 (*)     Glucose, Bld 291 (*)     Calcium 10.9 (*)     Alkaline Phosphatase 119 (*)     GFR calc non Af Amer 80 (*)     All other components within normal limits  LIPASE, BLOOD - Abnormal; Notable for the following:    Lipase 7 (*)     All other components within normal limits   No results found.   No diagnosis found.    MDM  57 year-year-old male made to CDU for dehydration protocol. Patient had anterior cervical discectomy per Dr. Dutch Quint on 10 /9 and  started vomiting on 10/11. Patient had no nausea medicine at home. His heart rate was 130 on admission. Patient received IV bolus fluid in the ER. And maintenance fluids. He received Dilaudid 1 mg IM every 2 hours as needed. He can have Reglan or Zofran for nausea in the CDU unit. His anion gap is 15. Plan discussed with Dr. Dierdre Highman and patient and we all agree that this is the plan of care.  If patient becomes worse and develops fever chills or his heart rate does not decrease he will have to be admitted. No weakness or numbness to his upper extremities. Soft collar in place.   Labs Reviewed  CBC -  Abnormal; Notable for the following:    WBC 12.3 (*)     Hemoglobin 12.8 (*)     MCV 76.0 (*)     MCH 25.0 (*)     All other components within normal limits  COMPREHENSIVE METABOLIC PANEL - Abnormal; Notable for the following:    Chloride 92 (*)     CO2 33 (*)     Glucose, Bld 291 (*)     Calcium 10.9 (*)     Alkaline Phosphatase 119 (*)     GFR calc non Af Amer 80 (*)     All other components within normal limits  LIPASE, BLOOD - Abnormal; Notable for the following:    Lipase 7 (*)     All other components within normal limits          Remi Haggard, NP 04/17/12 0144  Remi Haggard, NP 04/17/12 0232  3pm  report received from Tia PA. Patient remains in the CDU on dehydration protocol. His heart rate is above 110 and there was concern that he needed to be admitted. Hospitalist came down and evaluated him and spoke with Dr. Karma Ganja. They decided that he can go home and followup with his PCP on Monday. Patient will continue his insulin at home. He is tolerating by mouth's with no further vomiting. Patient is requesting crutches because he does not have his crutches from home and will not have them when he gets there because they're at another house. Patient uses crutches because he has "atrophy of both knee caps"  patient understands to continue fluids at home. He also understands to return  for uncontrolled nausea vomiting or any other concerns.  Remi Haggard, NP 04/18/12 1244

## 2012-04-17 NOTE — ED Notes (Signed)
Patient transported to X-ray 

## 2012-04-17 NOTE — Progress Notes (Signed)
IMTS Consult Note   Date: 04/17/2012               Patient Name:  Christian Wall MRN: [id number]  DOB: 1960-10-23 Age / Sex: 51 y.o., male   [name],PROVIDER              Medical Service: Internal Medicine Teaching Service              Attending Physician: Dr. Cliffton Asters    First Contact: Dr. Elenor Legato Pager: [phone]  Second Contact: Dr. Almyra Deforest Pager: [phone]            After Hours (After 5p/  First Contact Pager: [phone]  weekends / holidays): Second Contact Pager: [phone]     Chief Complaint: Nausea/vomiting  History of Present Illness: Patient is a 51 y.o. male with a PMHx of cerebral disc herniation s/p C5-6 cervical discectomy (04/13/2012), who presents to [facility] for evaluation of nausea/vomiting. Patient underwent aforementioned procedure three days prior to admission. He states that nausea/vomiting began two days post-op. He states that he had multiple episodes of vomiting with clear emesis beginning on 10/10 and persisting until time of presentation to the ED. He states he has been constipated since his procedure, with only one bowel movement during that interval of time. He denies any fevers, chills. Denies abdominal pain. Denies diarrhea. Denies CP, SOB, or cough. His only other complaint is that his post-op pain persists but is somewhat improved.   Christian Wall states that he came to the ED because he wanted something to improve his nausea and resolve his vomiting. He states that he does not wish to be admitted to the hospital, and would much prefer to be sent home from the ED if he can be given medications to help resolve his nausea. At time of exam, he states nausea has already improved significantly with zofran and reglan given in the ED.   Review of Systems: Per HPI.  Current Outpatient Medications:  Current Outpatient Prescriptions  Medication Sig Dispense Refill  . cyclobenzaprine (FLEXERIL) 10 MG tablet Take 1 tablet (10 mg total) by mouth 3  (three) times daily as needed for muscle spasms.  30 tablet  1  . esomeprazole (NEXIUM) 40 MG capsule Take 40 mg by mouth daily before breakfast.      . HYDROcodone-acetaminophen (NORCO/VICODIN) 5-325 MG per tablet Take 1-2 tablets by mouth every 4 (four) hours as needed. For pain      . insulin aspart (NOVOLOG) 100 UNIT/ML injection Inject 3-6 Units into the skin 3 (three) times daily before meals. Per sliding scale depending on what he eats      . insulin glargine (LANTUS) 100 UNIT/ML injection Inject 70 Units into the skin at bedtime.      . lipase/protease/amylase (CREON-10/PANCREASE) 12000 UNITS CPEP Take 1-3 capsules by mouth 2 (two) times daily. 3 capsules before meal, and 1 capsule before snack      . Olopatadine HCl (PATADAY) 0.2 % SOLN Apply 1 drop to eye daily as needed. For allergies      . pioglitazone-metformin (ACTOPLUS MET) 15-850 MG per tablet Take 1 tablet by mouth 2 (two) times daily with a meal.      . pregabalin (LYRICA) 150 MG capsule Take 150 mg by mouth 2 (two) times daily.      Marland Kitchen zolpidem (AMBIEN) 5 MG tablet Take 1 tablet (5 mg total) by mouth at bedtime as needed for sleep.  12 tablet  0    Allergies:  No Known Allergies   Past Medical History: Past Medical History  Diagnosis Date  . Diabetes mellitus   . Atrophy of calf muscles   . Depression   . GERD (gastroesophageal reflux disease)   . Peripheral neuropathy   . Hypercholesteremia     Past Surgical History: Past Surgical History  Procedure Date  . Pancreas surgery   . Anterior cervical decomp/discectomy fusion 04/13/2012    Procedure: ANTERIOR CERVICAL DECOMPRESSION/DISCECTOMY FUSION 1 LEVEL;  Surgeon: Temple Pacini, MD;  Location: [facility];  Service: Neurosurgery;  Laterality: N/A;  Cervcial Five-Six Anterior Cervical Decompression and Fusion with Allograft and Plating    Family History: No family history on file.  Social History: History   Social History  . Marital Status: Married    Spouse  Name: N/A    Number of Children: N/A  . Years of Education: N/A   Occupational History  . Not on file.   Social History Main Topics  . Smoking status: Current Every Day Smoker -- 1.0 packs/day for 30 years    Types: Cigarettes  . Smokeless tobacco: Never Used  . Alcohol Use: No  . Drug Use: No  . Sexually Active:    Other Topics Concern  . Not on file   Social History Narrative  . No narrative on file     Vital Signs: Blood pressure 169/80, pulse 115, temperature 98.5 F (36.9 C), temperature source Oral, resp. rate 20, SpO2 90.00%.  Physical Exam: General: Vital signs reviewed and noted. Well-developed, well-nourished, in no acute distress; alert, appropriate and cooperative throughout examination.  Head: Normocephalic, atraumatic. Padded neck bracing in place.   Eyes: PERRL, EOMI, No signs of anemia or jaundice.  Nose: Mucous membranes moist, not inflammed, nonerythematous.  Throat: Oropharynx nonerythematous, no exudate appreciated.   Neck: No deformities, masses, or tenderness noted.  Lungs:  Normal respiratory effort. Clear to auscultation BL without crackles or wheezes.  Heart: RRR. S1 and S2 normal without gallop, murmur, or rubs.  Abdomen:  BS normoactive. Soft, mildly distended, non-tender.  No masses or organomegaly.  Extremities: No pretibial edema.  Neurologic: A&O X3, CN II - XII are grossly intact. Motor strength is 5/5 in the all 4 extremities, Sensations intact to light touch, Cerebellar signs negative.  Skin: No visible rashes, scars.   Lab results: Basic Metabolic Panel:  Basename 04/17/12 1039 04/16/12 2223  NA 136 140  K 4.3 4.6  CL 97 92*  CO2 23 33*  GLUCOSE 272* 291*  BUN 18 17  CREATININE 0.87 1.05  CALCIUM 9.0 10.9*  MG -- --  PHOS -- --   Liver Function Tests:  [facility] 04/16/12 2223  AST 13  ALT 19  ALKPHOS 119*  BILITOT 0.6  PROT 8.3  ALBUMIN 4.0    Basename 04/16/12 2223  LIPASE 7*  AMYLASE --   CBC:  Basename  04/17/12 1039 04/16/12 2223  WBC 10.7* 12.3*  NEUTROABS 7.4 --  HGB 11.3* 12.8*  HCT 35.7* 39.0  MCV 77.3* 76.0*  PLT 231 256   CBG:  Basename 04/17/12 0743 04/17/12 0456 04/17/12 0208  GLUCAP 231* 182* 223*   Urinalysis:  Basename 04/17/12 1047  COLORURINE YELLOW  LABSPEC 1.023  PHURINE 5.5  GLUCOSEU 500*  HGBUR NEGATIVE  BILIRUBINUR SMALL*  KETONESUR 15*  PROTEINUR NEGATIVE  UROBILINOGEN 1.0  NITRITE NEGATIVE  LEUKOCYTESUR NEGATIVE    Imaging results:  Dg Chest 2 View  04/17/2012  *RADIOLOGY REPORT*  Clinical Data: Vomiting for 2 days  CHEST - 2 VIEW  Comparison: 04/09/2012  Findings: Heart size and vascular pattern are normal.  There is a well-defined interstitial opacity in the bilateral mid lung zone. There are no pleural effusions.  There is persistent density in the medial right lung base.  IMPRESSION: Persistent density cardiophrenic angle on the right.  New bilateral perihilar interstitial opacities.  These could represent areas of atelectasis.  Superimposed pneumonia is not excluded.   Original Report Authenticated By: Otilio Carpen, M.D.       Assessment & Plan: Patient is a 51 y.o. male with a PMHx of cerebral disc herniation s/p C5-6 cervical discectomy (04/13/2012), who presents to [facility] for evaluation of nausea/vomiting.  Nausea/vomiting - likely related to constipation from op- and post-op analgesia. Patient reports recent constipation since procedure on 04/13/2012, with only one bowel movement following the operation. On exam his abdomen is mildly distended, but he did not have any complaints of abdominal pain or TTP on exam. N/V already significantly improved with reglan and zofran given in the ED. Patient stable and he is reluctant to be admitted overnight for his nausea, stating he would prefer medications to control his nausea, as that is his only new and active complaint. Patient reported by nursing to be tolerating food following time of exam.  -  recommend PO anti-emetics.   Tachycardia - HR in low 120s on presentation, improved to 109-115 this AM. Tachycardia likely secondary to pain and initial mild hypovolemia 2/2 decreased PO intake.  Patient has no signs/symptoms concerning for infection. Afebrile. WBC = 10.7, but no left-shift, so likely related to dehydration in context of decreased PO intake. CXR revealed bibasilar atelectasis, which is likely 2/2 recent operation. This patient's tachycardia should be taken in the context of his baseline HR, which appears to be ~100 when reviewing EMR. HR therefore only slightly above baseline.  - patient should follow-up with PCP regarding baseline tachycardia. TSH wnl on 04/16/2008, but will benefit from repeat outpatient TSH  Dispo - At this time the patient is clinically stable. Patient states he has post-op f/u next week with Dr. Jordan Likes.     Signed: Elenor Legato, M.D. PGY-I, Internal Medicine Resident Pager: [phone] (7AM-5PM) 04/17/2012, 2:20 PM

## 2012-04-17 NOTE — Progress Notes (Signed)
Orthopedic Tech Progress Note Patient Details:  Christian Wall [birth year] [id number] Crutches fitted for patient. Patient stated he wanted crutches shorter than normal. Fitted to patients' liking for deformity/abnormality. Ortho Devices Type of Ortho Device: Crutches Ortho Device/Splint Interventions: Application   Asia R Thompson 04/17/2012, 4:28 PM

## 2012-04-20 NOTE — ED Provider Notes (Signed)
Medical screening examination/treatment/procedure(s) were performed by non-physician practitioner and as supervising physician I was immediately available for consultation/collaboration.   Laray Anger, DO 04/20/12 2055

## 2012-04-22 NOTE — ED Provider Notes (Signed)
Medical screening examination/treatment/procedure(s) were performed by non-physician practitioner and as supervising physician I was immediately available for consultation/collaboration.   Sunnie Nielsen, MD 04/22/12 1044

## 2012-08-23 ENCOUNTER — Other Ambulatory Visit: Payer: Self-pay | Admitting: Neurosurgery

## 2012-10-15 ENCOUNTER — Other Ambulatory Visit: Payer: Self-pay | Admitting: Neurosurgery

## 2012-10-15 DIAGNOSIS — M47812 Spondylosis without myelopathy or radiculopathy, cervical region: Secondary | ICD-10-CM

## 2012-10-21 ENCOUNTER — Ambulatory Visit
Admission: RE | Admit: 2012-10-21 | Discharge: 2012-10-21 | Disposition: A | Discharge status: Home / Self Care | Payer: Medicare Other | Source: Ambulatory Visit | Attending: Neurosurgery | Admitting: Neurosurgery

## 2012-10-21 DIAGNOSIS — M47812 Spondylosis without myelopathy or radiculopathy, cervical region: Secondary | ICD-10-CM

## 2013-11-04 DIAGNOSIS — B3781 Candidal esophagitis: Secondary | ICD-10-CM

## 2013-11-04 HISTORY — DX: Candidal esophagitis: B37.81

## 2013-11-04 HISTORY — PX: ESOPHAGOGASTRODUODENOSCOPY: SHX1529

## 2013-11-09 ENCOUNTER — Inpatient Hospital Stay (HOSPITAL_COMMUNITY): Payer: Medicare Other

## 2013-11-09 ENCOUNTER — Encounter (HOSPITAL_COMMUNITY): Payer: Self-pay | Admitting: Emergency Medicine

## 2013-11-09 ENCOUNTER — Inpatient Hospital Stay (HOSPITAL_COMMUNITY)
Admission: EM | Admit: 2013-11-09 | Discharge: 2013-11-13 | DRG: 637 | Disposition: A | Discharge status: Home / Self Care | Payer: Medicare Other | Attending: Internal Medicine | Admitting: Internal Medicine

## 2013-11-09 DIAGNOSIS — E876 Hypokalemia: Secondary | ICD-10-CM | POA: Diagnosis present

## 2013-11-09 DIAGNOSIS — Z9119 Patient's noncompliance with other medical treatment and regimen: Secondary | ICD-10-CM

## 2013-11-09 DIAGNOSIS — G9341 Metabolic encephalopathy: Secondary | ICD-10-CM | POA: Diagnosis present

## 2013-11-09 DIAGNOSIS — K3189 Other diseases of stomach and duodenum: Secondary | ICD-10-CM | POA: Diagnosis present

## 2013-11-09 DIAGNOSIS — K219 Gastro-esophageal reflux disease without esophagitis: Secondary | ICD-10-CM | POA: Diagnosis present

## 2013-11-09 DIAGNOSIS — N289 Disorder of kidney and ureter, unspecified: Secondary | ICD-10-CM

## 2013-11-09 DIAGNOSIS — Z23 Encounter for immunization: Secondary | ICD-10-CM

## 2013-11-09 DIAGNOSIS — K861 Other chronic pancreatitis: Secondary | ICD-10-CM | POA: Diagnosis present

## 2013-11-09 DIAGNOSIS — N179 Acute kidney failure, unspecified: Secondary | ICD-10-CM

## 2013-11-09 DIAGNOSIS — F1020 Alcohol dependence, uncomplicated: Secondary | ICD-10-CM | POA: Diagnosis present

## 2013-11-09 DIAGNOSIS — I959 Hypotension, unspecified: Secondary | ICD-10-CM | POA: Diagnosis present

## 2013-11-09 DIAGNOSIS — Z91199 Patient's noncompliance with other medical treatment and regimen due to unspecified reason: Secondary | ICD-10-CM

## 2013-11-09 DIAGNOSIS — F3289 Other specified depressive episodes: Secondary | ICD-10-CM | POA: Diagnosis present

## 2013-11-09 DIAGNOSIS — Z794 Long term (current) use of insulin: Secondary | ICD-10-CM

## 2013-11-09 DIAGNOSIS — E1100 Type 2 diabetes mellitus with hyperosmolarity without nonketotic hyperglycemic-hyperosmolar coma (NKHHC): Principal | ICD-10-CM | POA: Diagnosis present

## 2013-11-09 DIAGNOSIS — K3184 Gastroparesis: Secondary | ICD-10-CM | POA: Diagnosis present

## 2013-11-09 DIAGNOSIS — D509 Iron deficiency anemia, unspecified: Secondary | ICD-10-CM | POA: Diagnosis present

## 2013-11-09 DIAGNOSIS — E875 Hyperkalemia: Secondary | ICD-10-CM | POA: Diagnosis present

## 2013-11-09 DIAGNOSIS — E871 Hypo-osmolality and hyponatremia: Secondary | ICD-10-CM | POA: Diagnosis present

## 2013-11-09 DIAGNOSIS — E1149 Type 2 diabetes mellitus with other diabetic neurological complication: Secondary | ICD-10-CM | POA: Diagnosis present

## 2013-11-09 DIAGNOSIS — I878 Other specified disorders of veins: Secondary | ICD-10-CM

## 2013-11-09 DIAGNOSIS — M5010 Cervical disc disorder with radiculopathy, unspecified cervical region: Secondary | ICD-10-CM

## 2013-11-09 DIAGNOSIS — F17200 Nicotine dependence, unspecified, uncomplicated: Secondary | ICD-10-CM | POA: Diagnosis present

## 2013-11-09 DIAGNOSIS — E1129 Type 2 diabetes mellitus with other diabetic kidney complication: Secondary | ICD-10-CM | POA: Diagnosis present

## 2013-11-09 DIAGNOSIS — E1142 Type 2 diabetes mellitus with diabetic polyneuropathy: Secondary | ICD-10-CM | POA: Diagnosis present

## 2013-11-09 DIAGNOSIS — E1165 Type 2 diabetes mellitus with hyperglycemia: Secondary | ICD-10-CM | POA: Diagnosis present

## 2013-11-09 DIAGNOSIS — F329 Major depressive disorder, single episode, unspecified: Secondary | ICD-10-CM | POA: Diagnosis present

## 2013-11-09 DIAGNOSIS — Z981 Arthrodesis status: Secondary | ICD-10-CM

## 2013-11-09 DIAGNOSIS — K228 Other specified diseases of esophagus: Secondary | ICD-10-CM | POA: Diagnosis present

## 2013-11-09 DIAGNOSIS — N058 Unspecified nephritic syndrome with other morphologic changes: Secondary | ICD-10-CM | POA: Diagnosis present

## 2013-11-09 DIAGNOSIS — R933 Abnormal findings on diagnostic imaging of other parts of digestive tract: Secondary | ICD-10-CM

## 2013-11-09 DIAGNOSIS — E860 Dehydration: Secondary | ICD-10-CM

## 2013-11-09 DIAGNOSIS — D649 Anemia, unspecified: Secondary | ICD-10-CM

## 2013-11-09 DIAGNOSIS — E1110 Type 2 diabetes mellitus with ketoacidosis without coma: Secondary | ICD-10-CM

## 2013-11-09 DIAGNOSIS — K2289 Other specified disease of esophagus: Secondary | ICD-10-CM

## 2013-11-09 DIAGNOSIS — E7800 Pure hypercholesterolemia, unspecified: Secondary | ICD-10-CM | POA: Diagnosis present

## 2013-11-09 DIAGNOSIS — R079 Chest pain, unspecified: Secondary | ICD-10-CM

## 2013-11-09 DIAGNOSIS — R9431 Abnormal electrocardiogram [ECG] [EKG]: Secondary | ICD-10-CM | POA: Diagnosis not present

## 2013-11-09 LAB — CBC WITH DIFFERENTIAL/PLATELET
Basophils Absolute: 0 10*3/uL (ref 0.0–0.1)
Basophils Relative: 0 % (ref 0–1)
Eosinophils Absolute: 0 10*3/uL (ref 0.0–0.7)
Eosinophils Relative: 1 % (ref 0–5)
HEMATOCRIT: 40.6 % (ref 39.0–52.0)
HEMOGLOBIN: 13.3 g/dL (ref 13.0–17.0)
Lymphocytes Relative: 8 % — ABNORMAL LOW (ref 12–46)
Lymphs Abs: 0.7 10*3/uL (ref 0.7–4.0)
MCH: 23.5 pg — AB (ref 26.0–34.0)
MCHC: 32.8 g/dL (ref 30.0–36.0)
MCV: 71.6 fL — ABNORMAL LOW (ref 78.0–100.0)
MONO ABS: 1 10*3/uL (ref 0.1–1.0)
MONOS PCT: 12 % (ref 3–12)
Neutro Abs: 6.6 10*3/uL (ref 1.7–7.7)
Neutrophils Relative %: 79 % — ABNORMAL HIGH (ref 43–77)
Platelets: 270 10*3/uL (ref 150–400)
RBC: 5.67 MIL/uL (ref 4.22–5.81)
RDW: 18.1 % — ABNORMAL HIGH (ref 11.5–15.5)
WBC: 8.3 10*3/uL (ref 4.0–10.5)

## 2013-11-09 LAB — URINALYSIS, ROUTINE W REFLEX MICROSCOPIC
BILIRUBIN URINE: NEGATIVE
HGB URINE DIPSTICK: NEGATIVE
Ketones, ur: 15 mg/dL — AB
Leukocytes, UA: NEGATIVE
Nitrite: NEGATIVE
Protein, ur: NEGATIVE mg/dL
SPECIFIC GRAVITY, URINE: 1.027 (ref 1.005–1.030)
Urobilinogen, UA: 0.2 mg/dL (ref 0.0–1.0)
pH: 5 (ref 5.0–8.0)

## 2013-11-09 LAB — COMPREHENSIVE METABOLIC PANEL
ALT: 14 U/L (ref 0–53)
AST: 33 U/L (ref 0–37)
Albumin: 3.7 g/dL (ref 3.5–5.2)
Alkaline Phosphatase: 114 U/L (ref 39–117)
BILIRUBIN TOTAL: 0.5 mg/dL (ref 0.3–1.2)
BUN: 117 mg/dL — AB (ref 6–23)
CO2: 26 mEq/L (ref 19–32)
CREATININE: 2.3 mg/dL — AB (ref 0.50–1.35)
Calcium: 8.6 mg/dL (ref 8.4–10.5)
GFR calc Af Amer: 36 mL/min — ABNORMAL LOW (ref >90)
GFR, EST NON AFRICAN AMERICAN: 31 mL/min — AB (ref >90)
Glucose, Bld: 1132 mg/dL (ref 70–99)
Potassium: 7.4 mEq/L (ref 3.7–5.3)
Sodium: 108 mEq/L — CL (ref 137–147)
Total Protein: 7.6 g/dL (ref 6.0–8.3)

## 2013-11-09 LAB — I-STAT CHEM 8, ED
BUN: 105 mg/dL — ABNORMAL HIGH (ref 6–23)
CREATININE: 2.6 mg/dL — AB (ref 0.50–1.35)
Calcium, Ion: 1.03 mmol/L — ABNORMAL LOW (ref 1.12–1.23)
Chloride: 77 mEq/L — ABNORMAL LOW (ref 96–112)
HCT: 44 % (ref 39.0–52.0)
Hemoglobin: 15 g/dL (ref 13.0–17.0)
Potassium: 5.3 mEq/L (ref 3.7–5.3)
Sodium: 115 mEq/L — CL (ref 137–147)
TCO2: 27 mmol/L (ref 0–100)

## 2013-11-09 LAB — LIPASE, BLOOD: LIPASE: 47 U/L (ref 11–59)

## 2013-11-09 LAB — I-STAT TROPONIN, ED: Troponin i, poc: 0.01 ng/mL (ref 0.00–0.08)

## 2013-11-09 LAB — GLUCOSE, CAPILLARY
Glucose-Capillary: 442 mg/dL — ABNORMAL HIGH (ref 70–99)
Glucose-Capillary: 570 mg/dL (ref 70–99)

## 2013-11-09 LAB — URINE MICROSCOPIC-ADD ON

## 2013-11-09 LAB — I-STAT CG4 LACTIC ACID, ED: Lactic Acid, Venous: 2.4 mmol/L — ABNORMAL HIGH (ref 0.5–2.2)

## 2013-11-09 LAB — TROPONIN I: Troponin I: <0.3 ng/mL (ref <0.30)

## 2013-11-09 LAB — CBG MONITORING, ED
Glucose-Capillary: >600 mg/dL (ref 70–99)
Glucose-Capillary: >600 mg/dL (ref 70–99)

## 2013-11-09 LAB — MRSA PCR SCREENING: MRSA by PCR: NEGATIVE

## 2013-11-09 LAB — KETONES, QUALITATIVE

## 2013-11-09 MED ORDER — POTASSIUM CHLORIDE 10 MEQ/100ML IV SOLN
10.0000 meq | INTRAVENOUS | Status: AC
  Administered 2013-11-09 – 2013-11-10 (×4): 10 meq via INTRAVENOUS
  Filled 2013-11-09 (×4): qty 100

## 2013-11-09 MED ORDER — SODIUM CHLORIDE 0.9 % IV SOLN
INTRAVENOUS | Status: DC
  Administered 2013-11-09 – 2013-11-11 (×6): via INTRAVENOUS

## 2013-11-09 MED ORDER — DEXTROSE 50 % IV SOLN: 25.0000 mL | INTRAVENOUS | Status: DC | PRN

## 2013-11-09 MED ORDER — ENOXAPARIN SODIUM 40 MG/0.4ML ~~LOC~~ SOLN
40.0000 mg | SUBCUTANEOUS | Status: DC
  Administered 2013-11-09 – 2013-11-12 (×4): 40 mg via SUBCUTANEOUS
  Filled 2013-11-09 (×5): qty 0.4

## 2013-11-09 MED ORDER — INSULIN ASPART 100 UNIT/ML IV SOLN
10.0000 [IU] | Freq: Once | INTRAVENOUS | Status: AC
  Administered 2013-11-09: 10 [IU] via INTRAVENOUS

## 2013-11-09 MED ORDER — SODIUM CHLORIDE 0.9 % IV BOLUS (SEPSIS)
1000.0000 mL | Freq: Once | INTRAVENOUS | Status: AC
  Administered 2013-11-09: 1000 mL via INTRAVENOUS

## 2013-11-09 MED ORDER — FENTANYL CITRATE 0.05 MG/ML IJ SOLN
50.0000 ug | Freq: Once | INTRAMUSCULAR | Status: AC
  Administered 2013-11-09: 50 ug via INTRAVENOUS
  Filled 2013-11-09: qty 2

## 2013-11-09 MED ORDER — PNEUMOCOCCAL VAC POLYVALENT 25 MCG/0.5ML IJ INJ
0.5000 mL | INJECTION | INTRAMUSCULAR | Status: AC
  Administered 2013-11-11: 0.5 mL via INTRAMUSCULAR
  Filled 2013-11-09: qty 0.5

## 2013-11-09 MED ORDER — DEXTROSE-NACL 5-0.45 % IV SOLN
INTRAVENOUS | Status: DC
Start: 2013-11-09 — End: 2013-11-10
  Administered 2013-11-10: 03:00:00 via INTRAVENOUS

## 2013-11-09 MED ORDER — SODIUM CHLORIDE 0.9 % IV SOLN
INTRAVENOUS | Status: DC
  Administered 2013-11-09: 5.4 [IU]/h via INTRAVENOUS
  Filled 2013-11-09 (×2): qty 1

## 2013-11-09 MED ORDER — SODIUM CHLORIDE 0.9 % IV SOLN
INTRAVENOUS | Status: DC
  Administered 2013-11-10: 04:00:00 via INTRAVENOUS
  Filled 2013-11-09: qty 1

## 2013-11-09 MED ORDER — INSULIN REGULAR BOLUS VIA INFUSION
0.0000 [IU] | Freq: Three times a day (TID) | INTRAVENOUS | Status: DC
  Filled 2013-11-09: qty 10

## 2013-11-09 MED ORDER — SODIUM CHLORIDE 0.9 % IV SOLN: INTRAVENOUS | Status: DC

## 2013-11-09 MED ORDER — DEXTROSE-NACL 5-0.45 % IV SOLN: INTRAVENOUS | Status: DC

## 2013-11-09 MED ORDER — PANTOPRAZOLE SODIUM 40 MG IV SOLR
40.0000 mg | INTRAVENOUS | Status: DC
  Administered 2013-11-10: 40 mg via INTRAVENOUS
  Filled 2013-11-09 (×2): qty 40

## 2013-11-09 NOTE — H&P (Signed)
[facility] History and Physical  Christian Wall LZJ:[id number] DOB: September 21, 1960 DOA: 11/09/2013  Referring physician: ER physician. [name], Provider, MD  Chief Complaint: Nausea and heartburn.  HPI: Christian Wall is a 53 y.o. 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 Patient states that he has quit drinking alcohol for 2-3 years and 4 days ago he had gone out with his friend and had some alcohol following which he started having nausea and had one episode of vomiting. Since then he has been having heartburn and epigastric discomfort. He has been having constant hiccups. He felt weak and tired and came to the ER. In the ER patient was found to have blood sugars in the thousands and anion gap and severe hyponatremia and hyperkalemia. Patient was given 3-4 L of normal sitting bolus and started on IV insulin infusion for hyperosmolar state. Cardiac markers and LFTs were normal. EKG was unremarkable. Chest x-ray and CT abdomen is pending and patient will be admitted for further management. Patient denies any headache visual symptoms any focal deficits.   Review of Systems: As presented in the history of presenting illness, rest negative.  Past Medical History  Diagnosis Date  . Diabetes mellitus   . Atrophy of calf muscles   . Depression   . GERD (gastroesophageal reflux disease)   . Peripheral neuropathy   . Hypercholesteremia    Past Surgical History  Procedure Laterality Date  . Pancreas surgery    . Anterior cervical decomp/discectomy fusion  04/13/2012    Procedure: ANTERIOR CERVICAL DECOMPRESSION/DISCECTOMY FUSION 1 LEVEL;  Surgeon: Charlie Pitter, MD;  Location: [facility];  Service: Neurosurgery;  Laterality: N/A;  Cervcial Five-Six Anterior Cervical Decompression and Fusion with Allograft and Plating   Social History:  reports that he has been smoking  Cigarettes.  He has a 30 pack-year smoking history. He has never used smokeless tobacco. He reports that he does not drink alcohol or use illicit drugs. Where does patient live home. Can patient participate in ADLs? Yes.  No Known Allergies  Family History:  Family History  Problem Relation Age of Onset  . Diabetes Mellitus II Neg Hx   . CAD Neg Hx       Prior to Admission medications   Medication Sig Start Date End Date Taking? Authorizing Provider  esomeprazole (NEXIUM) 40 MG capsule Take 40 mg by mouth daily before breakfast.   Yes Historical Provider, MD  insulin aspart (NOVOLOG) 100 UNIT/ML injection Inject 3-6 Units into the skin 3 (three) times daily before meals. Per sliding scale depending on what he eats   Yes Historical Provider, MD  insulin glargine (LANTUS) 100 UNIT/ML injection Inject 70 Units into the skin at bedtime.   Yes Historical Provider, MD  lipase/protease/amylase (CREON-10/PANCREASE) 12000 UNITS CPEP Take 1-3 capsules by mouth 2 (two) times daily. 3 capsules before meal, and 1 capsule before snack   Yes Historical Provider, MD  Olopatadine HCl (PATADAY) 0.2 % SOLN Apply 1 drop to eye daily as needed. For allergies   Yes Historical Provider, MD  pioglitazone-metformin (ACTOPLUS MET) 15-850 MG per tablet Take 1 tablet by mouth 2 (two) times daily with a meal.   Yes Historical Provider, MD  pregabalin (LYRICA) 150 MG capsule Take 150 mg by mouth 2 (two) times daily.   Yes Historical Provider, MD    Physical Exam: Filed Vitals:   11/09/13 1900 11/09/13 1915 11/09/13  1930 11/09/13 2000  BP: 102/64 84/52 100/56 112/61  Pulse: 94 95 98   Temp:      TempSrc:      Resp: _0 SpO2: 88% 94% 92%      General:  Well-developed and nourished.  Eyes: Anicteric no pallor.  ENT: No discharge from the ears eyes nose mouth.  Neck: No mass felt.  Cardiovascular: S1-S2 heard.  Respiratory: No rhonchi or crepitations.  Abdomen: Soft nontender bowel sounds  present. No guarding or rigidity.  Skin: No rash.  Musculoskeletal: No edema.  Psychiatric: Appears normal.  Neurologic: Alert awake oriented to time place and person. Moves all extremities.  Labs on Admission:  Basic Metabolic Panel:  Recent Labs Lab 11/09/13 1642 11/09/13 1913  NA 108* 115*  K 7.4* 5.3  CL <65* 77*  CO2 26  --   GLUCOSE 1132* >700*  BUN 117* 105*  CREATININE 2.30* 2.60*  CALCIUM 8.6  --    Liver Function Tests:  Recent Labs Lab 11/09/13 1642  AST 33  ALT 14  ALKPHOS 114  BILITOT 0.5  PROT 7.6  ALBUMIN 3.7    Recent Labs Lab 11/09/13 1642  LIPASE 47   No results found for this basename: AMMONIA,  in the last 168 hours CBC:  Recent Labs Lab 11/09/13 1642 11/09/13 1913  WBC 8.3  --   NEUTROABS 6.6  --   HGB 13.3 15.0  HCT 40.6 44.0  MCV 71.6*  --   PLT 270  --    Cardiac Enzymes: No results found for this basename: CKTOTAL, CKMB, CKMBINDEX, TROPONINI,  in the last 168 hours  BNP (last 3 results) No results found for this basename: PROBNP,  in the last 8760 hours CBG:  Recent Labs Lab 11/09/13 1642 11/09/13 2004  GLUCAP >600* >600*    Radiological Exams on Admission: No results found.  EKG: Independently reviewed. Normal sinus rhythm.  Assessment/Plan Principal Problem:   Hyperosmolar non-ketotic state in patient with type 2 diabetes mellitus Active Problems:   DKA (diabetic ketoacidoses)   ARF (acute renal failure)   Chest pain   1. Hyperosmolar nonketotic uncontrolled diabetes mellitus - patient has been placed on aggressive IV fluid hydration and IV insulin. Closely follow metabolic panel. Check hemoglobin A1c. Not sure what exactly precipitated the state. Transition to long acting insulin once patient gets blood sugar control and also patient has anion gap acidosis. Closely follow intake output. 2. Hyponatremia and hypokalemia - which I think is corrected with correction of his uncontrolled diabetes. 3. Renal  failure probably acute - UA is pending. Continue with hydration and closely follow intake output and metabolic panel. 4. Chest pain and epigastric pain - probably related to alcoholism. Cycle cardiac markers. CT abdomen and pelvis is pending. 5. History of chronic pancreatitis secondary to alcoholism - has not had alcohol for many years but had a couple of drinks 4 days ago.    Code Status: Full code.  Family Communication: Patient's wife at the bedside.  Disposition Plan: Admit to inpatient.    [facility] Pager [phone].  If 7PM-7AM, please contact night-coverage [email or web address] Password [email or web address] 11/09/2013, 8:07 PM

## 2013-11-09 NOTE — ED Provider Notes (Signed)
CSN: [id number]     Arrival date & time 11/09/13  1630 History   First MD Initiated Contact with Patient 11/09/13 1653     Chief Complaint  Patient presents with  . Emesis  . Heartburn     (Consider location/radiation/quality/duration/timing/severity/associated sxs/prior Treatment) HPI Comments: Patient presents to the ER for evaluation of nausea, vomiting and abdominal pain. Patient reports that he has been sick for approximately one week. He has had frequent nausea and vomiting, has not been able to eat. He has had some intermittent abdominal cramping, but none currently. He reports burning sensation up into his chest which is thought was heartburn. He feels very weak. There has not been any fever.  Patient is a 53 y.o. male presenting with vomiting and heartburn.  Emesis Associated symptoms: abdominal pain   Heartburn Associated symptoms include chest pain and abdominal pain.    Past Medical History  Diagnosis Date  . Diabetes mellitus   . Atrophy of calf muscles   . Depression   . GERD (gastroesophageal reflux disease)   . Peripheral neuropathy   . Hypercholesteremia    Past Surgical History  Procedure Laterality Date  . Pancreas surgery    . Anterior cervical decomp/discectomy fusion  04/13/2012    Procedure: ANTERIOR CERVICAL DECOMPRESSION/DISCECTOMY FUSION 1 LEVEL;  Surgeon: Charlie Pitter, MD;  Location: [facility];  Service: Neurosurgery;  Laterality: N/A;  Cervcial Five-Six Anterior Cervical Decompression and Fusion with Allograft and Plating   History reviewed. No pertinent family history. History  Substance Use Topics  . Smoking status: Current Every Day Smoker -- 1.00 packs/day for 30 years    Types: Cigarettes  . Smokeless tobacco: Never Used  . Alcohol Use: No    Review of Systems  Cardiovascular: Positive for chest pain.       "heartburn"  Gastrointestinal: Positive for heartburn, nausea, vomiting and abdominal pain.  All other systems reviewed and are  negative.     Allergies  Review of patient's allergies indicates no known allergies.  Home Medications   Prior to Admission medications   Medication Sig Start Date End Date Taking? Authorizing Provider  cyclobenzaprine (FLEXERIL) 10 MG tablet Take 1 tablet (10 mg total) by mouth 3 (three) times daily as needed for muscle spasms. 04/14/12   Charlie Pitter, MD  docusate sodium (COLACE) 100 MG capsule Take 1 capsule (100 mg total) by mouth every 12 (twelve) hours. 04/17/12   Julieta Bellini, NP  esomeprazole (NEXIUM) 40 MG capsule Take 40 mg by mouth daily before breakfast.    Historical Provider, MD  HYDROcodone-acetaminophen (NORCO/VICODIN) 5-325 MG per tablet Take 1-2 tablets by mouth every 4 (four) hours as needed. For pain 04/14/12   Charlie Pitter, MD  insulin aspart (NOVOLOG) 100 UNIT/ML injection Inject 3-6 Units into the skin 3 (three) times daily before meals. Per sliding scale depending on what he eats    Historical Provider, MD  insulin glargine (LANTUS) 100 UNIT/ML injection Inject 70 Units into the skin at bedtime.    Historical Provider, MD  lipase/protease/amylase (CREON-10/PANCREASE) 12000 UNITS CPEP Take 1-3 capsules by mouth 2 (two) times daily. 3 capsules before meal, and 1 capsule before snack    Historical Provider, MD  metoCLOPramide (REGLAN) 10 MG tablet Take 1 tablet (10 mg total) by mouth every 6 (six) hours. 04/17/12   Julieta Bellini, NP  Olopatadine HCl (PATADAY) 0.2 % SOLN Apply 1 drop to eye daily as needed. For allergies    Historical Provider, MD  pioglitazone-metformin (ACTOPLUS MET) 15-850 MG per tablet Take 1 tablet by mouth 2 (two) times daily with a meal.    Historical Provider, MD  pregabalin (LYRICA) 150 MG capsule Take 150 mg by mouth 2 (two) times daily.    Historical Provider, MD  zolpidem (AMBIEN) 5 MG tablet Take 1 tablet (5 mg total) by mouth at bedtime as needed for sleep. 02/27/12 02/26/13  Tiffany Marilu Favre, PA-C   BP 100/56  Pulse 98  Temp(Src) 97 F (36.1  C) (Oral)  Resp 16  SpO2 92% Physical Exam  Constitutional: He is oriented to person, place, and time. He appears well-developed and well-nourished. No distress.  HENT:  Head: Normocephalic and atraumatic.  Right Ear: Hearing normal.  Left Ear: Hearing normal.  Nose: Nose normal.  Mouth/Throat: Oropharynx is clear and moist. Mucous membranes are dry.  Eyes: Conjunctivae and EOM are normal. Pupils are equal, round, and reactive to light.  Neck: Normal range of motion. Neck supple.  Cardiovascular: Regular rhythm, S1 normal and S2 normal.  Tachycardia present.  Exam reveals no gallop and no friction rub.   No murmur heard. Pulmonary/Chest: Effort normal and breath sounds normal. No respiratory distress. He exhibits no tenderness.  Abdominal: Soft. Normal appearance and bowel sounds are normal. There is no hepatosplenomegaly. There is no tenderness. There is no rebound, no guarding, no tenderness at McBurney's point and negative Murphy's sign. No hernia.  Musculoskeletal: Normal range of motion.  Neurological: He is alert and oriented to person, place, and time. He has normal strength. No cranial nerve deficit or sensory deficit. Coordination normal. GCS eye subscore is 4. GCS verbal subscore is 5. GCS motor subscore is 6.  Skin: Skin is warm, dry and intact. No rash noted. No cyanosis.  Psychiatric: He has a normal mood and affect. His speech is normal and behavior is normal. Thought content normal.    ED Course  Procedures (including critical care time)  CRITICAL CARE Performed by: Orpah Greek   Total critical care time: 30  Critical care time was exclusive of separately billable procedures and treating other patients.  Critical care was necessary to treat or prevent imminent or life-threatening deterioration.  Critical care was time spent personally by me on the following activities: development of treatment plan with patient and/or surrogate as well as nursing,  discussions with consultants, evaluation of patient's response to treatment, examination of patient, obtaining history from patient or surrogate, ordering and performing treatments and interventions, ordering and review of laboratory studies, ordering and review of radiographic studies, pulse oximetry and re-evaluation of patient's condition.   Angiocath insertion Performed by: Orpah Greek  Consent: Verbal consent obtained. Risks and benefits: risks, benefits and alternatives were discussed Time out: Immediately prior to procedure a "time out" was called to verify the correct patient, procedure, equipment, support staff and site/side marked as required.  Preparation: Patient was prepped and draped in the usual sterile fashion.  Vein Location: right antecub  Ultrasound Guided  Gauge: 20G  Normal blood return and flush without difficulty Patient tolerance: Patient tolerated the procedure well with no immediate complications.     Labs Review Labs Reviewed  CBC WITH DIFFERENTIAL - Abnormal; Notable for the following:    MCV 71.6 (*)    MCH 23.5 (*)    RDW 18.1 (*)    Neutrophils Relative % 79 (*)    Lymphocytes Relative 8 (*)    All other components within normal limits  COMPREHENSIVE METABOLIC PANEL - Abnormal;  Notable for the following:    Sodium 108 (*)    Potassium 7.4 (*)    Chloride <65 (*)    Glucose, Bld 1132 (*)    BUN 117 (*)    Creatinine, Ser 2.30 (*)    GFR calc non Af Amer 31 (*)    GFR calc Af Amer 36 (*)    All other components within normal limits  KETONES, QUALITATIVE - Abnormal; Notable for the following:    Acetone, Bld SMALL (*)    All other components within normal limits  CBG MONITORING, ED - Abnormal; Notable for the following:    Glucose-Capillary >600 (*)    All other components within normal limits  I-STAT CG4 LACTIC ACID, ED - Abnormal; Notable for the following:    Lactic Acid, Venous 2.40 (*)    All other components within normal  limits  I-STAT CHEM 8, ED - Abnormal; Notable for the following:    Sodium 115 (*)    Chloride 77 (*)    BUN 105 (*)    Creatinine, Ser 2.60 (*)    Glucose, Bld >700 (*)    Calcium, Ion 1.03 (*)    All other components within normal limits  LIPASE, BLOOD  URINALYSIS, ROUTINE W REFLEX MICROSCOPIC  I-STAT TROPOININ, ED    Imaging Review No results found.   EKG Interpretation   Date/Time:  Wednesday Nov 09 2013 16:35:52 EDT Ventricular Rate:  96 PR Interval:  162 QRS Duration: 90 QT Interval:  394 QTC Calculation: 497 R Axis:   69 Text Interpretation:  Normal sinus rhythm Low voltage QRS Cannot rule out  Anterior infarct , age undetermined Abnormal ECG No significant change  since last tracing Confirmed by POLLINA  MD, Derry [phone]) on  11/09/2013 6:06:44 PM      MDM   Final diagnoses:  DKA (diabetic ketoacidoses) versus hyperosmolar nonketotic state  Hyponatremia Acute Kdney Injury Mental Status Changes    Patient presents to the ER for evaluation of nausea, vomiting, epigastric and chest burning for one week. The patient appeared very dehydrated upon arrival. He was hypotensive. This is improving with IV fluids. Patient's blood sugar was greater than 600 by Accu-Chek, ultimately found to be 11 and 32. He does have positive ketones and very slight anion gap. Patient is afebrile. White count is 8.3. Serial examinations of his abdomen remained benign, nontender.  Patient's initial potassium was returned at 7.4, but there was hemolysis. I-STAT potassium was 5.3, reassuring. Patient does have obvious acute kidney injury.  Patient was initiated on IV insulin. I did discuss the case briefly with Doctor Zubelivitsky, who did confirm that the patient to be admitted to the step down unit by the hospitalist service, will consult if necessary.    Orpah Greek, MD 11/10/13 [phone]

## 2013-11-09 NOTE — ED Notes (Signed)
Patient given ice chips per dr. Blinda Leatherwood

## 2013-11-09 NOTE — ED Notes (Signed)
CRITICAL LAB:  CRITICAL SODIUM 108  CRITICAL POTASSIUM 7.4  (hemolysis noted)  CRITICAL CHLORIDE <65  CRITICAL GLUCOSE 1132  Notified by Zollie Beckers from the lab 1909  RN Notified Dr. Blinda Leatherwood at [phone]

## 2013-11-09 NOTE — ED Notes (Signed)
Attempted report 

## 2013-11-09 NOTE — ED Notes (Signed)
Pt presents with nausea, vomiting, decrease appetite, weakness, and heartburn x1 week

## 2013-11-09 NOTE — ED Notes (Signed)
NOTIFIED DR. POLLINA OF PATIENTS LAB RESULTS OF CG4+ LACTIC ACID ,@18 :07 PM ,11/09/2013.

## 2013-11-09 NOTE — ED Notes (Signed)
Chem 8 results given to Dr. Pollina 

## 2013-11-10 DIAGNOSIS — Z9119 Patient's noncompliance with other medical treatment and regimen: Secondary | ICD-10-CM

## 2013-11-10 DIAGNOSIS — N179 Acute kidney failure, unspecified: Secondary | ICD-10-CM

## 2013-11-10 DIAGNOSIS — K3189 Other diseases of stomach and duodenum: Secondary | ICD-10-CM | POA: Diagnosis present

## 2013-11-10 DIAGNOSIS — D649 Anemia, unspecified: Secondary | ICD-10-CM

## 2013-11-10 DIAGNOSIS — N289 Disorder of kidney and ureter, unspecified: Secondary | ICD-10-CM

## 2013-11-10 DIAGNOSIS — E1101 Type 2 diabetes mellitus with hyperosmolarity with coma: Secondary | ICD-10-CM

## 2013-11-10 DIAGNOSIS — Z91199 Patient's noncompliance with other medical treatment and regimen due to unspecified reason: Secondary | ICD-10-CM

## 2013-11-10 DIAGNOSIS — F1020 Alcohol dependence, uncomplicated: Secondary | ICD-10-CM

## 2013-11-10 DIAGNOSIS — I878 Other specified disorders of veins: Secondary | ICD-10-CM | POA: Diagnosis present

## 2013-11-10 DIAGNOSIS — K228 Other specified diseases of esophagus: Secondary | ICD-10-CM | POA: Diagnosis present

## 2013-11-10 DIAGNOSIS — K2289 Other specified disease of esophagus: Secondary | ICD-10-CM | POA: Diagnosis present

## 2013-11-10 DIAGNOSIS — G9341 Metabolic encephalopathy: Secondary | ICD-10-CM

## 2013-11-10 DIAGNOSIS — E1110 Type 2 diabetes mellitus with ketoacidosis without coma: Secondary | ICD-10-CM

## 2013-11-10 LAB — GLUCOSE, CAPILLARY
GLUCOSE-CAPILLARY: 211 mg/dL — AB (ref 70–99)
GLUCOSE-CAPILLARY: 185 mg/dL — AB (ref 70–99)
GLUCOSE-CAPILLARY: 182 mg/dL — AB (ref 70–99)
GLUCOSE-CAPILLARY: 239 mg/dL — AB (ref 70–99)
Glucose-Capillary: 165 mg/dL — ABNORMAL HIGH (ref 70–99)
Glucose-Capillary: 186 mg/dL — ABNORMAL HIGH (ref 70–99)
Glucose-Capillary: 208 mg/dL — ABNORMAL HIGH (ref 70–99)
Glucose-Capillary: 269 mg/dL — ABNORMAL HIGH (ref 70–99)
Glucose-Capillary: 293 mg/dL — ABNORMAL HIGH (ref 70–99)
Glucose-Capillary: 377 mg/dL — ABNORMAL HIGH (ref 70–99)

## 2013-11-10 LAB — BASIC METABOLIC PANEL
BUN: 104 mg/dL — ABNORMAL HIGH (ref 6–23)
BUN: 104 mg/dL — AB (ref 6–23)
BUN: 105 mg/dL — AB (ref 6–23)
CALCIUM: 8 mg/dL — AB (ref 8.4–10.5)
CALCIUM: 8 mg/dL — AB (ref 8.4–10.5)
CALCIUM: 8.1 mg/dL — AB (ref 8.4–10.5)
CO2: 28 mEq/L (ref 19–32)
CO2: 28 mEq/L (ref 19–32)
CO2: 26 mEq/L (ref 19–32)
CREATININE: 1.99 mg/dL — AB (ref 0.50–1.35)
CREATININE: 2.14 mg/dL — AB (ref 0.50–1.35)
Chloride: 85 mEq/L — ABNORMAL LOW (ref 96–112)
Chloride: 87 mEq/L — ABNORMAL LOW (ref 96–112)
Chloride: 87 mEq/L — ABNORMAL LOW (ref 96–112)
Creatinine, Ser: 2.06 mg/dL — ABNORMAL HIGH (ref 0.50–1.35)
GFR calc Af Amer: 43 mL/min — ABNORMAL LOW (ref >90)
GFR, EST AFRICAN AMERICAN: 41 mL/min — AB (ref >90)
GFR, EST AFRICAN AMERICAN: 39 mL/min — AB (ref >90)
GFR, EST NON AFRICAN AMERICAN: 34 mL/min — AB (ref >90)
GFR, EST NON AFRICAN AMERICAN: 35 mL/min — AB (ref >90)
GFR, EST NON AFRICAN AMERICAN: 37 mL/min — AB (ref >90)
GLUCOSE: 439 mg/dL — AB (ref 70–99)
Glucose, Bld: 272 mg/dL — ABNORMAL HIGH (ref 70–99)
Glucose, Bld: 213 mg/dL — ABNORMAL HIGH (ref 70–99)
Potassium: 4.5 mEq/L (ref 3.7–5.3)
Potassium: 4.7 mEq/L (ref 3.7–5.3)
Potassium: 5.2 mEq/L (ref 3.7–5.3)
Sodium: 123 mEq/L — ABNORMAL LOW (ref 137–147)
Sodium: 124 mEq/L — ABNORMAL LOW (ref 137–147)
Sodium: 125 mEq/L — ABNORMAL LOW (ref 137–147)

## 2013-11-10 LAB — CBC WITH DIFFERENTIAL/PLATELET
BASOS ABS: 0 10*3/uL (ref 0.0–0.1)
Basophils Relative: 0 % (ref 0–1)
EOS ABS: 0 10*3/uL (ref 0.0–0.7)
Eosinophils Relative: 0 % (ref 0–5)
HEMATOCRIT: 34.3 % — AB (ref 39.0–52.0)
Hemoglobin: 11.9 g/dL — ABNORMAL LOW (ref 13.0–17.0)
LYMPHS PCT: 14 % (ref 12–46)
Lymphs Abs: 1.1 10*3/uL (ref 0.7–4.0)
MCH: 23.5 pg — ABNORMAL LOW (ref 26.0–34.0)
MCHC: 34.7 g/dL (ref 30.0–36.0)
MCV: 67.7 fL — ABNORMAL LOW (ref 78.0–100.0)
Monocytes Absolute: 0.9 10*3/uL (ref 0.1–1.0)
Monocytes Relative: 11 % (ref 3–12)
NEUTROS ABS: 5.9 10*3/uL (ref 1.7–7.7)
Neutrophils Relative %: 75 % (ref 43–77)
Platelets: 198 10*3/uL (ref 150–400)
RBC: 5.07 MIL/uL (ref 4.22–5.81)
RDW: 16.3 % — AB (ref 11.5–15.5)
WBC: 7.9 10*3/uL (ref 4.0–10.5)

## 2013-11-10 LAB — HEMOGLOBIN A1C
HEMOGLOBIN A1C: 15.2 % — AB (ref <5.7)
MEAN PLASMA GLUCOSE: 390 mg/dL — AB (ref <117)

## 2013-11-10 LAB — LIPASE, BLOOD: LIPASE: 33 U/L (ref 11–59)

## 2013-11-10 LAB — TROPONIN I
Troponin I: <0.3 ng/mL (ref <0.30)
Troponin I: <0.3 ng/mL (ref <0.30)

## 2013-11-10 MED ORDER — VITAMIN B-1 100 MG PO TABS
100.0000 mg | ORAL_TABLET | Freq: Every day | ORAL | Status: DC
  Administered 2013-11-11 – 2013-11-13 (×3): 100 mg via ORAL
  Filled 2013-11-10 (×3): qty 1

## 2013-11-10 MED ORDER — INSULIN ASPART 100 UNIT/ML ~~LOC~~ SOLN
0.0000 [IU] | Freq: Three times a day (TID) | SUBCUTANEOUS | Status: DC
Start: 2013-11-10 — End: 2013-11-13
  Administered 2013-11-10 (×2): 5 [IU] via SUBCUTANEOUS
  Administered 2013-11-10: 3 [IU] via SUBCUTANEOUS
  Administered 2013-11-11: 8 [IU] via SUBCUTANEOUS
  Administered 2013-11-11: 3 [IU] via SUBCUTANEOUS
  Administered 2013-11-11: 11 [IU] via SUBCUTANEOUS
  Administered 2013-11-12: 3 [IU] via SUBCUTANEOUS
  Administered 2013-11-12: 2 [IU] via SUBCUTANEOUS
  Administered 2013-11-12: 3 [IU] via SUBCUTANEOUS
  Administered 2013-11-13: 5 [IU] via SUBCUTANEOUS

## 2013-11-10 MED ORDER — ALUM & MAG HYDROXIDE-SIMETH 200-200-20 MG/5ML PO SUSP
30.0000 mL | ORAL | Status: DC | PRN
  Administered 2013-11-10 – 2013-11-13 (×4): 30 mL via ORAL
  Filled 2013-11-10 (×4): qty 30

## 2013-11-10 MED ORDER — PANTOPRAZOLE SODIUM 40 MG PO TBEC
40.0000 mg | DELAYED_RELEASE_TABLET | Freq: Every day | ORAL | Status: DC
  Administered 2013-11-10 – 2013-11-13 (×4): 40 mg via ORAL
  Filled 2013-11-10 (×4): qty 1

## 2013-11-10 MED ORDER — THIAMINE HCL 100 MG/ML IJ SOLN
100.0000 mg | Freq: Every day | INTRAMUSCULAR | Status: DC
  Administered 2013-11-10: 100 mg via INTRAVENOUS
  Filled 2013-11-10: qty 1

## 2013-11-10 MED ORDER — INSULIN ASPART 100 UNIT/ML ~~LOC~~ SOLN
0.0000 [IU] | Freq: Every day | SUBCUTANEOUS | Status: DC
  Administered 2013-11-10: 3 [IU] via SUBCUTANEOUS

## 2013-11-10 MED ORDER — DEXTROSE 50 % IV SOLN: 50.0000 mL | Freq: Once | INTRAVENOUS | Status: AC | PRN

## 2013-11-10 MED ORDER — GLUCOSE 40 % PO GEL: 1.0000 | ORAL | Status: DC | PRN

## 2013-11-10 MED ORDER — INSULIN GLARGINE 100 UNIT/ML ~~LOC~~ SOLN
40.0000 [IU] | Freq: Every day | SUBCUTANEOUS | Status: DC
  Administered 2013-11-10 – 2013-11-11 (×2): 40 [IU] via SUBCUTANEOUS
  Filled 2013-11-10 (×2): qty 0.4

## 2013-11-10 MED ORDER — DEXTROSE 50 % IV SOLN: 25.0000 mL | Freq: Once | INTRAVENOUS | Status: AC | PRN

## 2013-11-10 MED ORDER — SODIUM CHLORIDE 0.9 % IV BOLUS (SEPSIS)
1000.0000 mL | Freq: Once | INTRAVENOUS | Status: AC
  Administered 2013-11-10: 1000 mL via INTRAVENOUS

## 2013-11-10 NOTE — Progress Notes (Signed)
Utilization Review Completed.  

## 2013-11-10 NOTE — Plan of Care (Addendum)
Problem: Food- and Nutrition-Related Knowledge Deficit (NB-1.1) Goal: Nutrition education Formal process to instruct or train a patient/client in a skill or to impart knowledge to help patients/clients voluntarily manage or modify food choices and eating behavior to maintain or improve health. Outcome: Completed/Met Date Met:  11/10/13  RD consulted for nutrition education regarding diabetes.   CBG (last 3)   Recent Labs   11/10/13 0554 11/10/13 0902 11/10/13 1218  GLUCAP 186* 165* 208*    RD provided "Carbohydrate Counting for People with Diabetes" handout from the Academy of [employer]etics. Discussed different food groups and their effects on blood sugar, emphasizing carbohydrate-containing foods. Provided list of carbohydrates and recommended serving sizes of common foods.  Discussed importance of controlled and consistent carbohydrate intake throughout the day. Provided examples of ways to balance meals/snacks and encouraged intake of high-fiber, whole grain complex carbohydrates. Teach back method used.  Expect good compliance.  Body mass index is 29.19 kg/(m^2). Pt meets criteria for Overweight based on current BMI.  Current diet order is Carbohydrate Modified, patient is consuming approximately 100% of meals at this time. Labs and medications reviewed. No further nutrition interventions warranted at this time. If additional nutrition issues arise, please re-consult RD.  Arthur Holms, RD, LDN Pager #: [phone] After-Hours Pager #: [phone]

## 2013-11-10 NOTE — Progress Notes (Signed)
[name]  Christian SpurrJanal Wall [name] DOB: [birth year] DOA: 11/09/2013 [name], Provider, MD  Time spent :  Brief narrative: 53 y.o. 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 Patient stated that he has quit drinking alcohol for 2-3 years and 4 days ago he had gone out with his friend and had some alcohol following which he started having nausea and had one episode of vomiting. Since then he had been having heartburn and epigastric discomfort. He had been having constant hiccups. He felt weak and tired and came to the ER.   In the ER patient was found to have blood sugar of 1132 and elevated anion gap and severe hyponatremia and hyperkalemia. Patient was given 3-4 L of normal sitting bolus and started on IV insulin infusion for hyperosmolar state. Cardiac markers and LFTs were normal. EKG was unremarkable. Chest x-ray and CT abdomen was pending and patient will be admitted for further management. Patient denied any headache visual symptoms any focal deficits  HPI/Subjective: No further nausea- wants to eat. No CP or SOB  Assessment/Plan: Active Problems:   Hyperosmolar non-ketotic state in patient with type 2 diabetes mellitus -CBGs have normalized with normal serum Co2- AG 12 but likely mildly up due to ARF -transition to LA insulin (lower dose since ARF) with SSI -advance diet -suspect excessive ETOH intake precipitated HHNK although highly suspicious not very adherent with regimen longterm -HgbA1c pending    ARF (acute renal failure)/  Dehydration  -SUSPECT DUE TO PROFOUND DEHYDRATION -baseline Scr unkown so have requested records form [facility] -likely has degree of diabetic nephropathy -cont IVF at 200/hr    Acute Metabolic encephalopathy -due to profound hyperglycemia and profound azotemia -follow   Chest pain -likely due to gastroparesis from severe hyperglycemia -cont PPI but may need to increase to BID (see below) -EKG with nonspecific changes so ECHO pending -CT revealed dilated distal thoracic esophagus with thickening concerning for esophagitis-also with gastric Wall thickening extending into duodenal bulb likely gastritis but possible neoplasm-consider GI consult once renal function improved      Anemia  -baseline ?? -check anemia panel -if no TSH in past 12 mos then check  Left renal lesion -measured 2.6 x 2.3 cm- radiologist rec non emergent , non contrast MRI abdomen to clarify    Alcoholism -monitor closely -given AMS and ARF hold on CIWA for now -CT reveals chronic calcific pancreatitis    ?? Non-adherence to medical treatment -pt endorsed was frustrated by frequent need to check CBGs at home    Poor venous access -have been unable to obtain labs -Dr Joseph ArtWoods considering placing CL- not a PICC candidate due to ARF and unknown if has CKD    Abnormal EKG -ECHO pending -TNI negative x 3 -suspect hyperkalemia at presentation explains ST changes   DVT prophylaxis: Lovenox but may need to adjust dosing based on renal function Code Status: Full Family Communication: No family at bedside Disposition Plan/Expected LOS: Step down   Consultants: None  Procedures: 2-D echocardiogram pending  Antibiotics: None  Objective: Blood pressure 118/67, pulse 92, temperature 97.7 F (36.5 C), temperature source Oral, resp. rate 14, height 5\' 9"  (1.753 m), weight 197 lb 12 oz (89.7 kg), SpO2 100.00%.  Intake/Output Summary (Last 24 hours) at 11/10/13 1433 Last data filed at 11/10/13 1404  Gross per 24 hour  Intake 2264.58  ml  Output    700 ml  Net 1564.58 ml     Exam: General: No acute respiratory distress-alert but seems somewhat confused Lungs: Clear to auscultation bilaterally without wheezes or crackles, RA Cardiovascular: Regular rate and rhythm without  murmur gallop or rub normal S1 and S2, no peripheral edema or JVD-IV fluid at 200 cc per hour Abdomen: Nontender, nondistended, soft, bowel sounds positive, no rebound, no ascites, no appreciable mass Musculoskeletal: Symmetrical without joint effusion, erythema or cyanosis  Scheduled Meds:  Scheduled Meds: . enoxaparin (LOVENOX) injection  40 mg Subcutaneous Q24H  . insulin aspart  0-15 Units Subcutaneous TID WC  . insulin aspart  0-5 Units Subcutaneous QHS  . insulin glargine  40 Units Subcutaneous Daily  . pantoprazole  40 mg Oral Daily  . pneumococcal 23 valent vaccine  0.5 mL Intramuscular Tomorrow-1000  . [START ON 11/11/2013] thiamine  100 mg Oral Daily   Continuous Infusions: . sodium chloride 200 mL/hr at 11/10/13 1251    Data Reviewed: Basic Metabolic Panel:  Recent Labs Lab 11/09/13 1642 11/09/13 1913 11/10/13 0024 11/10/13 0152 11/10/13 0405  NA 108* 115* 123* 124* 125*  K 7.4* 5.3 4.5 4.7 5.2  CL <65* 77* 85* 87* 87*  CO2 26  --  28 28 26   GLUCOSE 1132* >700* 439* 272* 213*  BUN 117* 105* 104* 105* 104*  CREATININE 2.30* 2.60* 1.99* 2.06* 2.14*  CALCIUM 8.6  --  8.0* 8.0* 8.1*   Liver Function Tests:  Recent Labs Lab 11/09/13 1642  AST 33  ALT 14  ALKPHOS 114  BILITOT 0.5  PROT 7.6  ALBUMIN 3.7    Recent Labs Lab 11/09/13 1642 11/10/13 0152  LIPASE 47 33   No results found for this basename: AMMONIA,  in the last 168 hours CBC:  Recent Labs Lab 11/09/13 1642 11/09/13 1913 11/10/13 0152  WBC 8.3  --  7.9  NEUTROABS 6.6  --  5.9  HGB 13.3 15.0 11.9*  HCT 40.6 44.0 34.3*  MCV 71.6*  --  67.7*  PLT 270  --  198   Cardiac Enzymes:  Recent Labs Lab 11/09/13 2220 11/10/13 0405 11/10/13 1100  TROPONINI <0.30 <0.30 <0.30   BNP (last 3 results) No results found for this basename: PROBNP,  in the last 8760 hours CBG:  Recent Labs Lab 11/10/13 0352 11/10/13 0455 11/10/13 0554 11/10/13 0902 11/10/13 1218  GLUCAP 185* 182* 186*  165* 208*    Recent Results (from the past 240 hour(s))  MRSA PCR SCREENING     Status: None   Collection Time    11/09/13 10:00 PM      Result Value Ref Range Status   MRSA by PCR NEGATIVE  NEGATIVE Final   Comment:            The GeneXpert MRSA Assay (FDA     approved for NASAL specimens     only), is one component of a     comprehensive MRSA colonization     surveillance program. It is not     intended to diagnose MRSA     infection nor to guide or     monitor treatment for     MRSA infections.     Studies:  Recent x-ray studies have been reviewed in detail by the Attending Physician       Junious Silk, ANP [facility] Office  [phone] Pager [phone]  **Disclaimer: This note may have been dictated with voice recognition software. Similar sounding words  can inadvertently be transcribed and this note may contain transcription errors which may not have been corrected upon publication of note.**   **If unable to reach the above provider after paging please contact the Flow Manager @ [phone]  On-Call/Text Page:      [email or web address]      password [email or web address]  If 7PM-7AM, please contact night-coverage [email or web address] Password [email or web address] 11/10/2013, 2:33 PM   LOS: 1 day   Examined Pt w/ ANP Revonda Standard discussed A/P and agree w/ plan. Plan discussed w/ Pt and all questions answered.

## 2013-11-10 NOTE — Progress Notes (Signed)
Inpatient Diabetes Program Recommendations  AACE/ADA: New Consensus Statement on Inpatient Glycemic Control (2013)  Target Ranges:  Prepandial:   less than 140 mg/dL      Peak postprandial:   less than 180 mg/dL (1-2 hours)      Critically ill patients:  140 - 180 mg/dL   Results for FRANKLIN, BAUMBACH (MRN [id number]) as of 11/10/2013 11:32  Ref. Range 11/10/2013 00:45 11/10/2013 01:49 11/10/2013 02:50 11/10/2013 03:52 11/10/2013 04:55 11/10/2013 05:54 11/10/2013 09:02  Glucose-Capillary Latest Range: 70-99 mg/dL 377 (H) 293 (H) 211 (H) 185 (H) 182 (H) 186 (H) 165 (H)   Diabetes history: DM2 Outpatient Diabetes medications: Lantus 70 units QHS, Novolog 3-6 units TID with meals, Actosplus Met 15-850 mg BID Current orders for Inpatient glycemic control: Lantus 40 units daily, Novolog 0-15 units AC, Novolog 0-5 units HS  Note: Consult received for diabetes coordinator (reason: no meter at home does not check his sugar).  Called patient over phone and requested to talk with him regarding diabetes and home regimen for diabetes control.  Patient states "Today is not good. I am not feeling good.  I will talk with you tomorrow.".  Therefore, will attempt to talk with patient again tomorrow face to face.  Thanks, Barnie Alderman, RN, MSN, CCRN Diabetes Coordinator Inpatient Diabetes Program [phone] (Team Pager) [phone] ([facility] office) [phone] [facility] office)

## 2013-11-10 NOTE — Progress Notes (Signed)
  Echocardiogram 2D Echocardiogram has been performed.  [name] 11/10/2013, 12:51 PM

## 2013-11-10 NOTE — Plan of Care (Cosign Needed)
No definitive old labs but anesthesia pre op note from 2013 lists all labs as normal except for elevated CBG- currently BUN >100 and UA without proteinuria so suspect severe pre renal ARF due to [facility]- will increase IVFs from 125 to 200/hr  Junious Silk, ANP

## 2013-11-11 DIAGNOSIS — R079 Chest pain, unspecified: Secondary | ICD-10-CM

## 2013-11-11 DIAGNOSIS — E860 Dehydration: Secondary | ICD-10-CM

## 2013-11-11 LAB — COMPREHENSIVE METABOLIC PANEL
ALBUMIN: 3.1 g/dL — AB (ref 3.5–5.2)
ALT: 21 U/L (ref 0–53)
AST: 24 U/L (ref 0–37)
Alkaline Phosphatase: 118 U/L — ABNORMAL HIGH (ref 39–117)
BILIRUBIN TOTAL: 0.6 mg/dL (ref 0.3–1.2)
BUN: 53 mg/dL — AB (ref 6–23)
CALCIUM: 7.5 mg/dL — AB (ref 8.4–10.5)
CO2: 24 mEq/L (ref 19–32)
Chloride: 95 mEq/L — ABNORMAL LOW (ref 96–112)
Creatinine, Ser: 1.22 mg/dL (ref 0.50–1.35)
GFR calc Af Amer: 77 mL/min — ABNORMAL LOW (ref >90)
GFR calc non Af Amer: 67 mL/min — ABNORMAL LOW (ref >90)
Glucose, Bld: 238 mg/dL — ABNORMAL HIGH (ref 70–99)
Potassium: 4.4 mEq/L (ref 3.7–5.3)
Sodium: 134 mEq/L — ABNORMAL LOW (ref 137–147)
TOTAL PROTEIN: 6.3 g/dL (ref 6.0–8.3)

## 2013-11-11 LAB — IRON AND TIBC
Iron: 100 ug/dL (ref 42–135)
Saturation Ratios: 34 % (ref 20–55)
TIBC: 295 ug/dL (ref 215–435)
UIBC: 195 ug/dL (ref 125–400)

## 2013-11-11 LAB — CBC
HEMATOCRIT: 32.9 % — AB (ref 39.0–52.0)
Hemoglobin: 10.6 g/dL — ABNORMAL LOW (ref 13.0–17.0)
MCH: 22.8 pg — AB (ref 26.0–34.0)
MCHC: 32.2 g/dL (ref 30.0–36.0)
MCV: 70.9 fL — AB (ref 78.0–100.0)
PLATELETS: 181 10*3/uL (ref 150–400)
RBC: 4.64 MIL/uL (ref 4.22–5.81)
RDW: 16.8 % — ABNORMAL HIGH (ref 11.5–15.5)
WBC: 6.9 10*3/uL (ref 4.0–10.5)

## 2013-11-11 LAB — VITAMIN B12: Vitamin B-12: 1100 pg/mL — ABNORMAL HIGH (ref 211–911)

## 2013-11-11 LAB — GLUCOSE, CAPILLARY
GLUCOSE-CAPILLARY: 167 mg/dL — AB (ref 70–99)
GLUCOSE-CAPILLARY: 161 mg/dL — AB (ref 70–99)
Glucose-Capillary: 246 mg/dL — ABNORMAL HIGH (ref 70–99)
Glucose-Capillary: 301 mg/dL — ABNORMAL HIGH (ref 70–99)

## 2013-11-11 LAB — RETICULOCYTES
RBC.: 4.64 MIL/uL (ref 4.22–5.81)
RETIC CT PCT: 0.6 % (ref 0.4–3.1)
Retic Count, Absolute: 27.8 10*3/uL (ref 19.0–186.0)

## 2013-11-11 LAB — FERRITIN: FERRITIN: 229 ng/mL (ref 22–322)

## 2013-11-11 LAB — FOLATE: Folate: 12.3 ng/mL

## 2013-11-11 MED ORDER — INSULIN GLARGINE 100 UNIT/ML ~~LOC~~ SOLN
30.0000 [IU] | Freq: Once | SUBCUTANEOUS | Status: AC
  Administered 2013-11-11: 30 [IU] via SUBCUTANEOUS
  Filled 2013-11-11: qty 0.3

## 2013-11-11 MED ORDER — LORAZEPAM 1 MG PO TABS
1.0000 mg | ORAL_TABLET | ORAL | Status: DC | PRN
  Administered 2013-11-11: 1 mg via ORAL
  Filled 2013-11-11: qty 1

## 2013-11-11 MED ORDER — BUSPIRONE HCL 15 MG PO TABS: 7.5000 mg | ORAL_TABLET | Freq: Two times a day (BID) | ORAL | Status: DC

## 2013-11-11 MED ORDER — PIOGLITAZONE HCL 15 MG PO TABS
15.0000 mg | ORAL_TABLET | Freq: Every day | ORAL | Status: DC
  Administered 2013-11-11 – 2013-11-13 (×3): 15 mg via ORAL
  Filled 2013-11-11 (×3): qty 1

## 2013-11-11 MED ORDER — INSULIN GLARGINE 100 UNIT/ML ~~LOC~~ SOLN
70.0000 [IU] | Freq: Every day | SUBCUTANEOUS | Status: DC
  Filled 2013-11-11: qty 0.7

## 2013-11-11 MED ORDER — INSULIN GLARGINE 100 UNIT/ML ~~LOC~~ SOLN
70.0000 [IU] | Freq: Every day | SUBCUTANEOUS | Status: DC
  Administered 2013-11-12 – 2013-11-13 (×2): 70 [IU] via SUBCUTANEOUS
  Filled 2013-11-11 (×3): qty 0.7

## 2013-11-11 MED ORDER — INSULIN ASPART 100 UNIT/ML ~~LOC~~ SOLN
3.0000 [IU] | Freq: Three times a day (TID) | SUBCUTANEOUS | Status: DC
  Administered 2013-11-11 – 2013-11-13 (×6): 3 [IU] via SUBCUTANEOUS

## 2013-11-11 NOTE — Progress Notes (Signed)
IV RN unable to get iv site on patient,Dr. Mahala Menghini notified. Caprice Mccaffrey Heide Scales, RN

## 2013-11-11 NOTE — Progress Notes (Signed)
Pt transferred to 6E05 per w/c with all belongings. Accompanied by nurse tech and wife. Told next RN that release of information had been obtained and faxed to [facility] per MD order. Followed up with phone call to make sure they got fax. They said that all would be forwarded to their medical record dept.

## 2013-11-11 NOTE — Progress Notes (Signed)
[name]  Christian Wall ZOX:[id number] DOB: 1960-09-08 DOA: 11/09/2013 [name], Provider, MD  Time spent :  Brief narrative: 53 y.o. 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 Patient stated that he has quit drinking alcohol for 2-3 years and 4 days ago he had gone out with his friend and had some alcohol following which he started having nausea and had one episode of vomiting. Since then he had been having heartburn and epigastric discomfort. He had been having constant hiccups. He felt weak and tired and came to the ER.   In the ER patient was found to have blood sugar of 1132 and elevated anion gap and severe hyponatremia and hyperkalemia. Patient was given 3-4 L of normal sitting bolus and started on IV insulin infusion for hyperosmolar state. Cardiac markers and LFTs were normal. EKG was unremarkable. Chest x-ray and CT abdomen was pending and patient will be admitted for further management. Patient denied any headache visual symptoms any focal deficits  HPI/Subjective: No further nausea- wants to eat. Wants to go home.  Assessment/Plan: Active Problems:   Hyperosmolar non-ketotic state in patient with type 2 diabetes mellitus -CBGs have normalized with normal serum Co2- AG 12 but likely mildly up due to ARF -transitioned to LA insulin (lower dose since ARF) with SSI 5/7 but now that renal fnx near baseline will resume home dose (70 units) and add back Actos -still holding Metformin -advance diet -suspect excessive ETOH intake precipitated HHNK although highly suspicious not very adherent with regimen longterm -HgbA1c 15.2- POOR CONTROL- await diabetes educator eval    ARF (acute renal failure)/  Dehydration  -SUSPECT DUE TO PROFOUND DEHYDRATION -baseline Scr unkown so have requested records form [facility] -likely has degree of diabetic nephropathy -cont IVF at 100/hr    Acute Metabolic encephalopathy -resolved -due to profound hyperglycemia and profound azotemia -follow    Chest pain -likely due to gastroparesis from severe hyperglycemia -cont PPI but may need to increase to BID (see below) -EKG with nonspecific changes so ECHO pending -CT revealed dilated distal thoracic esophagus with thickening concerning for esophagitis-also with gastric wall thickening extending into duodenal bulb likely gastritis but possible neoplasm-GI consult- poor intake- hungry but then doesn't eat    Anemia  -baseline ?? -check anemia panel -if no TSH in past 12 mos then check  Left renal lesion -measured 2.6 x 2.3 cm- radiologist rec non emergent , non contrast MRI abdomen to clarify    Alcoholism -monitor closely -given AMS and ARF hold on CIWA for now -CT reveals chronic calcific pancreatitis    ?? Non-adherence to medical treatment -pt endorsed was frustrated by frequent need to check CBGs at home    Poor venous access -have been unable to obtain labs -Dr Joseph Art considering placing CL- not a PICC candidate due to ARF and unknown if has CKD    Abnormal EKG -ECHO pending -TNI negative x 3 -suspect hyperkalemia at presentation explains ST changes   DVT prophylaxis: Lovenox but may need to adjust dosing based on renal function Code Status: Full Family Communication: No family at bedside Disposition Plan/Expected LOS: Transfer to floor   Consultants: None  Procedures: 2-D echocardiogram  - Left ventricle: -LVEF 55% to 60%.  -  A trivial pericardial effusion was identified.  Antibiotics: None  Objective: Blood pressure 137/71, pulse 99, temperature  97.3 F (36.3 C), temperature source Oral, resp. rate 17, height 5\' 9"  (1.753 m), weight 197 lb 12 oz (89.7 kg), SpO2 100.00%.  Intake/Output Summary (Last 24 hours) at 11/11/13 1110 Last data filed at 11/11/13 0630  Gross per  24 hour  Intake   4590 ml  Output   2001 ml  Net   2589 ml     Exam: General: No acute respiratory distress-alert but seems somewhat confused Lungs: Clear to auscultation bilaterally without wheezes or crackles, RA Cardiovascular: Regular rate and rhythm without murmur gallop or rub normal S1 and S2, no peripheral edema or JVD-IV fluid at 200 cc per hour Abdomen: Nontender, nondistended, soft, bowel sounds positive, no rebound, no ascites, no appreciable mass Musculoskeletal: Symmetrical without joint effusion, erythema or cyanosis  Scheduled Meds:  Scheduled Meds: . enoxaparin (LOVENOX) injection  40 mg Subcutaneous Q24H  . insulin aspart  0-15 Units Subcutaneous TID WC  . insulin aspart  0-5 Units Subcutaneous QHS  . insulin aspart  3 Units Subcutaneous TID WC  . insulin glargine  30 Units Subcutaneous Once  . [START ON 11/12/2013] insulin glargine  70 Units Subcutaneous Daily  . pantoprazole  40 mg Oral Daily  . pioglitazone  15 mg Oral Daily  . thiamine  100 mg Oral Daily   Continuous Infusions: . sodium chloride 100 mL/hr (11/11/13 1030)    Data Reviewed: Basic Metabolic Panel:  Recent Labs Lab 11/09/13 1642 11/09/13 1913 11/10/13 0024 11/10/13 0152 11/10/13 0405 11/11/13 0330  NA 108* 115* 123* 124* 125* 134*  K 7.4* 5.3 4.5 4.7 5.2 4.4  CL <65* 77* 85* 87* 87* 95*  CO2 26  --  28 28 26 24   GLUCOSE 1132* >700* 439* 272* 213* 238*  BUN 117* 105* 104* 105* 104* 53*  CREATININE 2.30* 2.60* 1.99* 2.06* 2.14* 1.22  CALCIUM 8.6  --  8.0* 8.0* 8.1* 7.5*   Liver Function Tests:  Recent Labs Lab 11/09/13 1642 11/11/13 0330  AST 33 24  ALT 14 21  ALKPHOS 114 118*  BILITOT 0.5 0.6  PROT 7.6 6.3  ALBUMIN 3.7 3.1*    Recent Labs Lab 11/09/13 1642 11/10/13 0152  LIPASE 47 33   No results found for this basename: AMMONIA,  in the last 168 hours CBC:  Recent Labs Lab 11/09/13 1642 11/09/13 1913 11/10/13 0152 11/11/13 0330  WBC 8.3  --  7.9 6.9   NEUTROABS 6.6  --  5.9  --   HGB 13.3 15.0 11.9* 10.6*  HCT 40.6 44.0 34.3* 32.9*  MCV 71.6*  --  67.7* 70.9*  PLT 270  --  198 181   Cardiac Enzymes:  Recent Labs Lab 11/09/13 2220 11/10/13 0405 11/10/13 1100  TROPONINI <0.30 <0.30 <0.30   BNP (last 3 results) No results found for this basename: PROBNP,  in the last 8760 hours CBG:  Recent Labs Lab 11/10/13 0902 11/10/13 1218 11/10/13 1615 11/10/13 2345 11/11/13 0847  GLUCAP 165* 208* 239* 269* 246*    Recent Results (from the past 240 hour(s))  MRSA PCR SCREENING     Status: None   Collection Time    11/09/13 10:00 PM      Result Value Ref Range Status   MRSA by PCR NEGATIVE  NEGATIVE Final   Comment:            The GeneXpert MRSA Assay (FDA     approved for NASAL specimens     only), is one component of a  comprehensive MRSA colonization     surveillance program. It is not     intended to diagnose MRSA     infection nor to guide or     monitor treatment for     MRSA infections.     Studies:  Recent x-ray studies have been reviewed in detail by the Attending Physician       Junious SilkAllison Ellis, ANP [facility] Office  [phone] Pager [phone]  **Disclaimer: This note may have been dictated with voice recognition software. Similar sounding words can inadvertently be transcribed and this note may contain transcription errors which may not have been corrected upon publication of note.**   **If unable to reach the above provider after paging please contact the Flow Manager @ [phone]  On-Call/Text Page:      [email or web address]      password [email or web address]  If 7PM-7AM, please contact night-coverage [email or web address] Password [email or web address] 11/11/2013, 11:10 AM   LOS: 2 days   Examined Patient and discussed A/P with ANP Revonda StandardAllison and agree with plan. Discussed plan w/ Pt and all questions answered. Greater then 35 min spent on direct Pt care. Pt to be discharged in Am

## 2013-11-11 NOTE — Progress Notes (Signed)
Called report to [facility] RN Pt has transfer orders for 6East room 5.

## 2013-11-11 NOTE — Plan of Care (Signed)
Problem: Consults Goal: Diabetic Ketoacidosis (DKA) Patient Education See Patient Education Modules for education specifics.  Outcome: Completed/Met Date Met:  11/11/13 Diabetic coordinator & Dietician visited pt and wife.

## 2013-11-11 NOTE — Progress Notes (Signed)
Patient requests for "medicine to calm me",Dr. Mahala Menghini notified,order received. Chelsy Parrales Heide Scales, RN

## 2013-11-11 NOTE — Progress Notes (Addendum)
Inpatient Diabetes Program Recommendations  AACE/ADA: New Consensus Statement on Inpatient Glycemic Control (2013)  Target Ranges:  Prepandial:   less than 140 mg/dL      Peak postprandial:   less than 180 mg/dL (1-2 hours)      Critically ill patients:  140 - 180 mg/dL   Results for Christian Wall, Christian Wall (MRN [id number]) as of 11/11/2013 09:43  Ref. Range 11/10/2013 09:02 11/10/2013 12:18 11/10/2013 16:15 11/10/2013 23:45 11/11/2013 08:47  Glucose-Capillary Latest Range: 70-99 mg/dL 165 (H) 208 (H) 239 (H) 269 (H) 246 (H)   Diabetes history: DM2  Outpatient Diabetes medications: Lantus 70 units QHS, Novolog 3-6 units TID with meals, Actosplus Met 15-850 mg BID  Current orders for Inpatient glycemic control: Lantus 40 units daily, Novolog 0-15 units AC, Novolog 0-5 units HS  Inpatient Diabetes Program Recommendations Insulin - Basal: Please consider increasing Lantus to 50 units daily. Since patient has already received Lantus 40 units today, please consider ordering a one time dose of Lantus 10 units now (for total of 50 units for today).  11/11/13@14 :00-Spoke with patient and his wife about diabetes and home regimen for diabetes control.  Patient states that he takes his diabetes medications as prescribed but he does not check his blood glucose at home because he needs a glucometer and testing supplies. Patient also states that each time he goes to get his ActosPlus Met refilled he has trouble because the insurance company doesn't want to cover it.  Encouraged patient to talk with his PCP regarding the potential of taking Actos and Metformin as separate medications if he continues to have issues with insurance coverage for the combination medication.  Patient is followed by his PCP for diabetes management.  Discussed A1C results (15.2% on 11/09/13) and explained what an A1C is, basic pathophysiology of DM Type 2, basic home care, importance of checking CBGs and maintaining good CBG control to prevent long-term and  short-term complications. Patient and his wife verbalized understanding of information discussed and he states that he does not have any questions related to diabetes at this time.  At time of discharge, patient will need prescription for a glucometer, test strips, and lancets.    Thanks, Barnie Alderman, RN, MSN, CCRN Diabetes Coordinator Inpatient Diabetes Program [phone] (Team Pager) [phone] ([facility] office) [phone] [facility] office)

## 2013-11-12 DIAGNOSIS — Z9119 Patient's noncompliance with other medical treatment and regimen: Secondary | ICD-10-CM

## 2013-11-12 DIAGNOSIS — K228 Other specified diseases of esophagus: Secondary | ICD-10-CM

## 2013-11-12 DIAGNOSIS — K319 Disease of stomach and duodenum, unspecified: Secondary | ICD-10-CM

## 2013-11-12 DIAGNOSIS — Z91199 Patient's noncompliance with other medical treatment and regimen due to unspecified reason: Secondary | ICD-10-CM

## 2013-11-12 DIAGNOSIS — D509 Iron deficiency anemia, unspecified: Secondary | ICD-10-CM

## 2013-11-12 DIAGNOSIS — K2289 Other specified disease of esophagus: Secondary | ICD-10-CM

## 2013-11-12 DIAGNOSIS — R9431 Abnormal electrocardiogram [ECG] [EKG]: Secondary | ICD-10-CM

## 2013-11-12 DIAGNOSIS — R933 Abnormal findings on diagnostic imaging of other parts of digestive tract: Secondary | ICD-10-CM

## 2013-11-12 DIAGNOSIS — K219 Gastro-esophageal reflux disease without esophagitis: Secondary | ICD-10-CM

## 2013-11-12 LAB — BASIC METABOLIC PANEL
BUN: 26 mg/dL — ABNORMAL HIGH (ref 6–23)
BUN: 23 mg/dL (ref 6–23)
CALCIUM: 8 mg/dL — AB (ref 8.4–10.5)
CO2: 19 meq/L (ref 19–32)
CO2: 21 mEq/L (ref 19–32)
CREATININE: 0.97 mg/dL (ref 0.50–1.35)
Calcium: 7.6 mg/dL — ABNORMAL LOW (ref 8.4–10.5)
Chloride: 101 mEq/L (ref 96–112)
Chloride: 101 mEq/L (ref 96–112)
Creatinine, Ser: 0.95 mg/dL (ref 0.50–1.35)
GFR calc Af Amer: >90 mL/min (ref >90)
GFR calc non Af Amer: >90 mL/min (ref >90)
GFR calc non Af Amer: >90 mL/min (ref >90)
Glucose, Bld: 165 mg/dL — ABNORMAL HIGH (ref 70–99)
Glucose, Bld: 143 mg/dL — ABNORMAL HIGH (ref 70–99)
POTASSIUM: 4.6 meq/L (ref 3.7–5.3)
Potassium: 4.1 mEq/L (ref 3.7–5.3)
SODIUM: 137 meq/L (ref 137–147)
Sodium: 135 mEq/L — ABNORMAL LOW (ref 137–147)

## 2013-11-12 LAB — GLUCOSE, CAPILLARY
GLUCOSE-CAPILLARY: 143 mg/dL — AB (ref 70–99)
GLUCOSE-CAPILLARY: 243 mg/dL — AB (ref 70–99)
Glucose-Capillary: 178 mg/dL — ABNORMAL HIGH (ref 70–99)
Glucose-Capillary: 179 mg/dL — ABNORMAL HIGH (ref 70–99)

## 2013-11-12 NOTE — Progress Notes (Signed)
[facility]                                                                              Patient Demographics  Christian Wall, is a 53 y.o. male, DOB - Aug 20, 1960, HXT:[id number]  Admit date - 11/09/2013   Admitting Physician Eduard Clos, MD  Outpatient Primary MD for the patient is Default, Provider, MD  LOS - 3   Chief Complaint  Patient presents with  . Emesis  . Heartburn      Brief narrative:  53 y.o. 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 Patient stated that he has quit drinking alcohol for 2-3 years and 4 days ago he had gone out with his friend and had some alcohol following which he started having nausea and had one episode of vomiting. Since then he had been having heartburn and epigastric discomfort. He had been having constant hiccups. He felt weak and tired and came to the ER.  In the ER patient was found to have blood sugar of 1132 and elevated anion gap and severe hyponatremia and hyperkalemia. Patient was given 3-4 L of normal sitting bolus and started on IV insulin infusion for hyperosmolar state. Cardiac markers and LFTs were normal. EKG was unremarkable. Chest x-ray and CT abdomen was pending and patient will be admitted for further management. Patient denied any headache visual symptoms any focal deficits.    Assessment & Plan   Hyperosmolar nonketotic state in patient with underlying type 2 diabetes mellitus -Patient was initially admitted to step down unit and placed on IV fluid as well as insulin -Patient's CBGs were monitored and have trended downward -Patient was transitioned to long-acting insulin as well as insulin sliding scale -He was restarted on Actos, however his metformin was held -Patient was initially n.p.o. however his diet was advanced and tolerated it well. -Patient has underlying alcohol abuse which may have  precipitated his HHNK  Metabolic acidosis -Secondary to be above  Acute renal failure secondary to dehydration -Resolved with IV fluid hydration -Creatinine currently 0.97  Acute metabolic encephalopathy -Likely secondary to hyperglycemia with azotemia -Resolved, patient is not baseline  Chest pain -Resolved -Likely secondary to gastroparesis from severe hyperglycemia -Echocardiogram: -EKG showed nonspecific changes -CT of the chest revealed a dilated thoracic esophagus with thickening concerning for esophagitis versus possible neoplasm -Troponins remained negative x3 -Patient did have some ST changes which are likely secondary to hyperkalemia from patient's renal failure  Dilated distal thoracic esophagus -Noted on CT scan -Gastroenterology, Dr. Rhea Belton has been consulted -Continue PPI -Patient is currently asymptomatic at this point  Anemia -Anemia panel: Iron 100, Feritin 229 -Hemoglobin 10.6 -Will order FOBT -Drop in Hb may also be secondary to dilution from IVF  Left renal lesion -2.6 x 2.3cm lesion noted on CT abd -Patient will need followup MRI (nonemergent)  Alcoholism -Currently on Sular protocol -CT reveals chronic calcific pancreatitis -Patient counseled  ? Nonadherence to medical treatment -Patient stated he was frustrated by frequent need to check blood sugars at home  Code Status: Full  Family Communication: None at bedside  Disposition Plan:  Admitted, pending GI consultation, possible discharge 5/10  Time Spent in minutes   30 minutes  Procedures  Echocardiogram Study Conclusions - Left ventricle: May have a slight septal bounce noted, but ventricular function is good. The cavity size was normal. Systolic function was normal. The estimated ejection fraction was in the range of 55% to 60%. Wall motion was normal; there were no regional wall motion abnormalities. - Pericardium, extracardiac: A trivial pericardial effusion was identified.  Consults    Gastroenterology  DVT Prophylaxis  Lovenox   Lab Results  Component Value Date   PLT 181 11/11/2013    Medications  Scheduled Meds: . enoxaparin (LOVENOX) injection  40 mg Subcutaneous Q24H  . insulin aspart  0-15 Units Subcutaneous TID WC  . insulin aspart  0-5 Units Subcutaneous QHS  . insulin aspart  3 Units Subcutaneous TID WC  . insulin glargine  70 Units Subcutaneous Daily  . pantoprazole  40 mg Oral Daily  . pioglitazone  15 mg Oral Daily  . thiamine  100 mg Oral Daily   Continuous Infusions: . sodium chloride Stopped (11/11/13 1930)   PRN Meds:.alum & mag hydroxide-simeth, dextrose, LORazepam  Antibiotics    Anti-infectives   None      Subjective:   Christian Wall seen and examined today.  Patient currently has no complaints, states his nausea has subsided. Patient inquires about when he will be able to be discharged.  Objective:   Filed Vitals:   11/11/13 1732 11/11/13 2042 11/11/13 2355 11/12/13 0414  BP: 156/76 149/76 156/71 167/58  Pulse: 95 100 104 101  Temp: 97.9 F (36.6 C) 98.3 F (36.8 C) 98.8 F (37.1 C) 98.3 F (36.8 C)  TempSrc: Oral Oral Oral Oral  Resp: 18 16 17 17   Height:      Weight:      SpO2: 96% 98% 97% 100%    Wt Readings from Last 3 Encounters:  11/09/13 89.7 kg (197 lb 12 oz)  04/09/12 89.7 kg (197 lb 12 oz)     Intake/Output Summary (Last 24 hours) at 11/12/13 0817 Last data filed at 11/12/13 0700  Gross per 24 hour  Intake   1770 ml  Output    626 ml  Net   1144 ml    Exam  General: Well developed, well nourished, NAD, appears stated age  HEENT: NCAT, PERRLA, EOMI, Anicteic Sclera, mucous membranes moist.   Neck: Supple, no JVD, no masses  Cardiovascular: S1 S2 auscultated, no rubs, murmurs or gallops. Regular rate and rhythm.  Respiratory: Clear to auscultation bilaterally with equal chest rise  Abdomen: Soft, nontender, nondistended, + bowel sounds  Extremities: warm dry without cyanosis clubbing or  edema  Neuro: AAOx3, cranial nerves grossly intact. Strength 5/5 in patient's upper and lower extremities bilaterally  Skin: Without rashes exudates or nodules  Psych: Normal affect and demeanor with intact judgement and insight  Data Review   Micro Results Recent Results (from the past 240 hour(s))  MRSA PCR SCREENING     Status: None   Collection Time    11/09/13 10:00 PM      Result Value Ref Range Status   MRSA by PCR NEGATIVE  NEGATIVE Final   Comment:            The GeneXpert MRSA Assay (FDA     approved for NASAL specimens     only), is one component of a     comprehensive MRSA colonization     surveillance program. It is  not     intended to diagnose MRSA     infection nor to guide or     monitor treatment for     MRSA infections.    Radiology Reports Ct Abdomen Pelvis Wo Contrast  11/09/2013   CLINICAL DATA:  Nausea, vomiting, decreased appetite, weakness, and heartburn for 1 week, history diabetes, GERD  EXAM: CT ABDOMEN AND PELVIS WITHOUT CONTRAST  TECHNIQUE: Multidetector CT imaging of the abdomen and pelvis was performed following the standard protocol without IV contrast. Patient drank dilute oral contrast for exam. Sagittal and coronal MPR images reconstructed from axial data set.  COMPARISON:  None  FINDINGS: Minimal atelectasis at lung bases.  Dilated esophagus containing contrast, with mild wall thickening of the distal esophagus, question related to reflux disease.  Stomach appears moderately well distended with suspect scattered gastric wall thickening, question gastritis, tumor and infiltrative process not entirely excluded.  Calcifications in pancreas compatible with chronic calcific pancreatitis.  Thickening of Gerota's fascia on LEFT.  Mildly ptotic LEFT kidney with a 4.4 x 3.3 cm diameter exophytic cyst at inferior pole and an additional indeterminate complex lower pole lesion 2.6 x 2.3 cm.  Tiny nonobstructing RIGHT renal calculi.  Within limits of a nonenhanced  exam, no focal abnormalities of the liver, spleen, or adrenal glands.  Normal appendix.  Scattered atherosclerotic calcification.  Well distended normal appearing bladder with minimal prostatic enlargement.  Large and small bowel loops unremarkable.  No mass, adenopathy, free fluid, or free air.  No acute osseous findings.  Heterotopic calcifications is identified at the posterior pelvis bilaterally within luteal muscular planes, question related to prior muscular trauma or myositis ossificans.  IMPRESSION: Dilated distal thoracic esophagus with bowel wall thickening raising question of reflux esophagitis, tumor less likely.  Additionally, gastric wall thickening extending into duodenal bulb region, favor gastritis over tumor though endoscopic assessment of both the esophagus and the stomach is recommended to exclude neoplasm.  Chronic calcific pancreatitis.  Cyst at inferior pole RIGHT kidney with an additional indeterminate 2.6 x 2.3 cm diameter inferior pole LEFT renal lesion ; characterization of this renal lesion by followup nonemergent MR abdomen with and without contrast recommended.   Electronically Signed   By: Ulyses SouthwardMark  Boles M.D.   On: 11/09/2013 21:28   Dg Chest Port 1 View  11/09/2013   CLINICAL DATA:  Short of breath. Chest burning. History of diabetes and hypertension.  EXAM: PORTABLE CHEST - 1 VIEW  COMPARISON:  DG CHEST 2 VIEW dated 04/17/2012  FINDINGS: Apical lordotic projection. Cardiopericardial silhouette within normal limits. Mediastinal contours normal. Trachea midline. No airspace disease or effusion. Calcification is present around the distal left clavicle, likely from prior trauma. Monitoring leads project over the chest. Exostosis/osteochondroma projects off the medial left proximal humeral metaphysis.  IMPRESSION: No acute cardiopulmonary disease.   Electronically Signed   By: Andreas NewportGeoffrey  Lamke M.D.   On: 11/09/2013 20:25    CBC  Recent Labs Lab 11/09/13 1642 11/09/13 1913  11/10/13 0152 11/11/13 0330  WBC 8.3  --  7.9 6.9  HGB 13.3 15.0 11.9* 10.6*  HCT 40.6 44.0 34.3* 32.9*  PLT 270  --  198 181  MCV 71.6*  --  67.7* 70.9*  MCH 23.5*  --  23.5* 22.8*  MCHC 32.8  --  34.7 32.2  RDW 18.1*  --  16.3* 16.8*  LYMPHSABS 0.7  --  1.1  --   MONOABS 1.0  --  0.9  --   EOSABS 0.0  --  0.0  --   BASOSABS 0.0  --  0.0  --     Chemistries   Recent Labs Lab 11/09/13 1642  11/10/13 0024 11/10/13 0152 11/10/13 0405 11/11/13 0330 11/12/13 0200  NA 108*  < > 123* 124* 125* 134* 135*  K 7.4*  < > 4.5 4.7 5.2 4.4 4.6  CL <65*  < > 85* 87* 87* 95* 101  CO2 26  --  28 28 26 24 19   GLUCOSE 1132*  < > 439* 272* 213* 238* 165*  BUN 117*  < > 104* 105* 104* 53* 26*  CREATININE 2.30*  < > 1.99* 2.06* 2.14* 1.22 0.97  CALCIUM 8.6  --  8.0* 8.0* 8.1* 7.5* 7.6*  AST 33  --   --   --   --  24  --   ALT 14  --   --   --   --  21  --   ALKPHOS 114  --   --   --   --  118*  --   BILITOT 0.5  --   --   --   --  0.6  --   < > = values in this interval not displayed. ------------------------------------------------------------------------------------------------------------------ estimated creatinine clearance is 98.7 ml/min (by C-G formula based on Cr of 0.97). ------------------------------------------------------------------------------------------------------------------  Recent Labs  11/09/13 1642  HGBA1C 15.2*   ------------------------------------------------------------------------------------------------------------------ No results found for this basename: CHOL, HDL, LDLCALC, TRIG, CHOLHDL, LDLDIRECT,  in the last 72 hours ------------------------------------------------------------------------------------------------------------------ No results found for this basename: TSH, T4TOTAL, FREET3, T3FREE, THYROIDAB,  in the last 72 hours ------------------------------------------------------------------------------------------------------------------  Recent  Labs  11/11/13 0330  VITAMINB12 1100*  FOLATE 12.3  FERRITIN 229  TIBC 295  IRON 100  RETICCTPCT 0.6    Coagulation profile No results found for this basename: INR, PROTIME,  in the last 168 hours  No results found for this basename: DDIMER,  in the last 72 hours  Cardiac Enzymes  Recent Labs Lab 11/09/13 2220 11/10/13 0405 11/10/13 1100  TROPONINI <0.30 <0.30 <0.30   ------------------------------------------------------------------------------------------------------------------ No components found with this basename: POCBNP,     Yameli Delamater D.O. on 11/12/2013 at 8:17 AM  Between 7am to 7pm - Pager - [phone]  After 7pm go to [email or web address] - password [email or web address]  And look for the night coverage person covering for me after hours  [facility] Office  [phone]

## 2013-11-12 NOTE — Consult Note (Signed)
Consult Note  Primary Gastroenterologist:   None Consulting MD: Dr. Curly Shores, MD (hospitalist team)  REASON FOR CONSULT: Abnormal GI imaging reflux symptoms  HPI: Christian Wall is a 53 y.o. male with past medical history of chronic pancreatitis with alcohol abuse, diabetes with peripheral neuropathy, GERD, hypercholesterolemia, depression, degenerative disc disease status post cervical fusion who was admitted with DKA (blood sugar 1100 with severe hyponatremia and hyperkalemia).  Reportedly he presented to the ER because of heartburn and nausea. He also had 4 days of drinking after a 2-3 year abstinence. He reported hiccups on presentation. He required insulin infusion to correct his hyperosmolar hyperglycemia. He also had acute kidney injury secondary to volume depletion. He had a CT scan which revealed a dilated distal thoracic esophagus with thickening suspicious for esophagitis but also gastric wall thickening extending to the duodenal bulb.  He reports history of GERD and is taking Nexium 40 mg at home. Since sugars have improved his appetite has returned and he denies dysphagia or odynophagia. He denies nausea or vomiting. No rectal bleeding or melena. He has never had a GI procedure   Past Medical History  Diagnosis Date  . Diabetes mellitus   . Atrophy of calf muscles   . Depression   . GERD (gastroesophageal reflux disease)   . Peripheral neuropathy   . Hypercholesteremia     Past Surgical History  Procedure Laterality Date  . Pancreas surgery    . Anterior cervical decomp/discectomy fusion  04/13/2012    Procedure: ANTERIOR CERVICAL DECOMPRESSION/DISCECTOMY FUSION 1 LEVEL;  Surgeon: Charlie Pitter, MD;  Location: [facility];  Service: Neurosurgery;  Laterality: N/A;  Cervcial Five-Six Anterior Cervical Decompression and Fusion with Allograft and Plating    Prior to Admission medications   Medication Sig Start Date End Date Taking? Authorizing Provider  esomeprazole  (NEXIUM) 40 MG capsule Take 40 mg by mouth daily before breakfast.   Yes Historical Provider, MD  insulin aspart (NOVOLOG) 100 UNIT/ML injection Inject 3-6 Units into the skin 3 (three) times daily before meals. Per sliding scale depending on what he eats   Yes Historical Provider, MD  insulin glargine (LANTUS) 100 UNIT/ML injection Inject 70 Units into the skin at bedtime.   Yes Historical Provider, MD  lipase/protease/amylase (CREON-10/PANCREASE) 12000 UNITS CPEP Take 1-3 capsules by mouth 2 (two) times daily. 3 capsules before meal, and 1 capsule before snack   Yes Historical Provider, MD  Olopatadine HCl (PATADAY) 0.2 % SOLN Apply 1 drop to eye daily as needed. For allergies   Yes Historical Provider, MD  pioglitazone-metformin (ACTOPLUS MET) 15-850 MG per tablet Take 1 tablet by mouth 2 (two) times daily with a meal.   Yes Historical Provider, MD  pregabalin (LYRICA) 150 MG capsule Take 150 mg by mouth 2 (two) times daily.   Yes Historical Provider, MD    Current Facility-Administered Medications  Medication Dose Route Frequency Provider Last Rate Last Dose  . 0.9 %  sodium chloride infusion   Intravenous Continuous Samella Parr, NP   100 mL/hr at 11/11/13 1030  . alum & mag hydroxide-simeth (MAALOX/MYLANTA) 200-200-20 MG/5ML suspension 30 mL  30 mL Oral Q4H PRN Allie Bossier, MD   30 mL at 11/10/13 2359  . dextrose (GLUTOSE) 40 % oral gel 37.5 g  1 Tube Oral PRN Gardiner Barefoot, NP      . enoxaparin (LOVENOX) injection 40 mg  40 mg Subcutaneous Q24H Rise Patience, MD   40 mg at  11/11/13 2151  . insulin aspart (novoLOG) injection 0-15 Units  0-15 Units Subcutaneous TID WC Gardiner Barefoot, NP   2 Units at 11/12/13 [id number]  . insulin aspart (novoLOG) injection 0-5 Units  0-5 Units Subcutaneous QHS Gardiner Barefoot, NP   3 Units at 11/10/13 2359  . insulin aspart (novoLOG) injection 3 Units  3 Units Subcutaneous TID WC Samella Parr, NP   3 Units at 11/12/13 0905  . insulin  glargine (LANTUS) injection 70 Units  70 Units Subcutaneous Daily Samella Parr, NP   70 Units at 11/12/13 1045  . LORazepam (ATIVAN) tablet 1 mg  1 mg Oral Q4H PRN Nita Sells, MD   1 mg at 11/11/13 2255  . pantoprazole (PROTONIX) EC tablet 40 mg  40 mg Oral Daily Allie Bossier, MD   40 mg at 11/12/13 1045  . pioglitazone (ACTOS) tablet 15 mg  15 mg Oral Daily Samella Parr, NP   15 mg at 11/12/13 1045  . thiamine (VITAMIN B-1) tablet 100 mg  100 mg Oral Daily [place], RPH   100 mg at 11/12/13 1045    Allergies as of 11/09/2013  . (No Known Allergies)    Family History  Problem Relation Age of Onset  . Diabetes Mellitus II Neg Hx   . CAD Neg Hx     History   Social History  . Marital Status: Married    Spouse Name: N/A    Number of Children: N/A  . Years of Education: N/A   Occupational History  . Not on file.   Social History Main Topics  . Smoking status: Current Every Day Smoker -- 1.00 packs/day for 30 years    Types: Cigarettes  . Smokeless tobacco: Never Used  . Alcohol Use: No  . Drug Use: No  . Sexual Activity: Not on file   Other Topics Concern  . Not on file   Social History Narrative  . No narrative on file    Review of Systems:  All systems reviewed an negative except where noted in HPI.  Physical Exam: Vital signs in last 24 hours: Temp:  [97.9 F (36.6 C)-99.6 F (37.6 C)] 99.6 F (37.6 C) (05/09 0928) Pulse Rate:  [95-109] 109 (05/09 0928) Resp:  [16-18] 16 (05/09 0928) BP: (128-167)/(58-95) 128/95 mmHg (05/09 0928) SpO2:  [96 %-100 %] 100 % (05/09 0928) Last BM Date: 11/11/13 Gen: awake, alert, NAD HEENT: anicteric, op clear CV: RRR, no mrg Pulm: CTA b/l Abd: soft, NT/ND, +BS throughout Ext: no c/c/e Neuro: nonfocal  Lab Results:  Recent Labs  11/09/13 1642 11/09/13 1913 11/10/13 0152 11/11/13 0330  WBC 8.3  --  7.9 6.9  HGB 13.3 15.0 11.9* 10.6*  HCT 40.6 44.0 34.3* 32.9*  PLT 270  --  198 181    BMET  Recent Labs  11/11/13 0330 11/12/13 0200 11/12/13 0710  NA 134* 135* 137  K 4.4 4.6 4.1  CL 95* 101 101  CO2 24 19 21   GLUCOSE 238* 165* 143*  BUN 53* 26* 23  CREATININE 1.22 0.97 0.95  CALCIUM 7.5* 7.6* 8.0*   LFT  Recent Labs  11/11/13 0330  PROT 6.3  ALBUMIN 3.1*  AST 24  ALT 21  ALKPHOS 118*  BILITOT 0.6    Studies/Results: CT ABDOMEN AND PELVIS WITHOUT CONTRAST -- 11/09/2013   TECHNIQUE: Multidetector CT imaging of the abdomen and pelvis was performed following the standard protocol without IV contrast. Patient drank dilute oral  contrast for exam. Sagittal and coronal MPR images reconstructed from axial data set.   COMPARISON:  None   FINDINGS: Minimal atelectasis at lung bases.   Dilated esophagus containing contrast, with mild wall thickening of the distal esophagus, question related to reflux disease.   Stomach appears moderately well distended with suspect scattered gastric wall thickening, question gastritis, tumor and infiltrative process not entirely excluded.   Calcifications in pancreas compatible with chronic calcific pancreatitis.   Thickening of Gerota's fascia on LEFT.   Mildly ptotic LEFT kidney with a 4.4 x 3.3 cm diameter exophytic cyst at inferior pole and an additional indeterminate complex lower pole lesion 2.6 x 2.3 cm.   Tiny nonobstructing RIGHT renal calculi.   Within limits of a nonenhanced exam, no focal abnormalities of the liver, spleen, or adrenal glands.   Normal appendix.   Scattered atherosclerotic calcification.   Well distended normal appearing bladder with minimal prostatic enlargement.   Large and small bowel loops unremarkable.   No mass, adenopathy, free fluid, or free air.   No acute osseous findings.   Heterotopic calcifications is identified at the posterior pelvis bilaterally within luteal muscular planes, question related to prior muscular trauma or myositis ossificans.     IMPRESSION: Dilated distal thoracic esophagus with bowel wall thickening raising question of reflux esophagitis, tumor less likely.   Additionally, gastric wall thickening extending into duodenal bulb region, favor gastritis over tumor though endoscopic assessment of both the esophagus and the stomach is recommended to exclude neoplasm.   Chronic calcific pancreatitis.   Cyst at inferior pole RIGHT kidney with an additional indeterminate 2.6 x 2.3 cm diameter inferior pole LEFT renal lesion ; characterization of this renal lesion by followup nonemergent MR abdomen with and without contrast recommended.     Impression / Plan:   53 y.o. male with past medical history of chronic pancreatitis with alcohol abuse, diabetes with peripheral neuropathy, GERD, hypercholesterolemia, depression, degenerative disc disease status post cervical fusion who was admitted with hyperglycemic hyperosmolar nonketotic state also with upper GI complaints and abnormal upper GI imaging  1.  Abnl GI imaging/GERD -- given history of GERD with abnormal imaging, I recommended upper endoscopy. This can be performed as an inpatient or outpatient. If discharge occurring today, I recommended upper endoscopy within the next month as an outpatient in our endoscopy center. If he does stay an additional night in the hospital, can attempt EGD tomorrow before discharge.  2.  Microcytic anemia -- performing upper endoscopy as discussed in #1. Colonoscopy is recommended as an outpatient for screening and to evaluate anemia     LOS: 3 days   Jerene Bears  11/12/2013, 11:43 AM

## 2013-11-13 ENCOUNTER — Encounter (HOSPITAL_COMMUNITY)
Admission: EM | Disposition: A | Discharge status: Home / Self Care | Payer: Self-pay | Source: Home / Self Care | Attending: Internal Medicine

## 2013-11-13 DIAGNOSIS — I998 Other disorder of circulatory system: Secondary | ICD-10-CM

## 2013-11-13 LAB — BASIC METABOLIC PANEL
BUN: 20 mg/dL (ref 6–23)
CHLORIDE: 101 meq/L (ref 96–112)
CO2: 23 mEq/L (ref 19–32)
Calcium: 8.4 mg/dL (ref 8.4–10.5)
Creatinine, Ser: 0.98 mg/dL (ref 0.50–1.35)
Glucose, Bld: 225 mg/dL — ABNORMAL HIGH (ref 70–99)
Potassium: 4.9 mEq/L (ref 3.7–5.3)
SODIUM: 139 meq/L (ref 137–147)

## 2013-11-13 LAB — CBC
HEMATOCRIT: 32.4 % — AB (ref 39.0–52.0)
HEMOGLOBIN: 10.2 g/dL — AB (ref 13.0–17.0)
MCH: 23.1 pg — ABNORMAL LOW (ref 26.0–34.0)
MCHC: 31.5 g/dL (ref 30.0–36.0)
MCV: 73.3 fL — ABNORMAL LOW (ref 78.0–100.0)
Platelets: 227 10*3/uL (ref 150–400)
RBC: 4.42 MIL/uL (ref 4.22–5.81)
RDW: 17.5 % — AB (ref 11.5–15.5)
WBC: 10.6 10*3/uL — ABNORMAL HIGH (ref 4.0–10.5)

## 2013-11-13 LAB — GLUCOSE, CAPILLARY: GLUCOSE-CAPILLARY: 250 mg/dL — AB (ref 70–99)

## 2013-11-13 SURGERY — EGD (ESOPHAGOGASTRODUODENOSCOPY)
Anesthesia: Moderate Sedation

## 2013-11-13 MED ORDER — GLUCOSE BLOOD VI STRP: ORAL_STRIP | Status: DC

## 2013-11-13 MED ORDER — INSULIN GLARGINE 100 UNIT/ML ~~LOC~~ SOLN: 70.0000 [IU] | Freq: Every day | SUBCUTANEOUS | Status: DC

## 2013-11-13 MED ORDER — INSULIN ASPART 100 UNIT/ML ~~LOC~~ SOLN: 3.0000 [IU] | Freq: Three times a day (TID) | SUBCUTANEOUS | Status: DC

## 2013-11-13 MED ORDER — CVS BLOOD GLUCOSE METER W/DEVICE KIT: PACK | Status: DC

## 2013-11-13 MED ORDER — THIAMINE HCL 100 MG PO TABS: 100.0000 mg | ORAL_TABLET | Freq: Every day | ORAL | Status: DC

## 2013-11-13 MED ORDER — INSULIN ASPART 100 UNIT/ML ~~LOC~~ SOLN: 0.0000 [IU] | Freq: Three times a day (TID) | SUBCUTANEOUS | Status: DC

## 2013-11-13 NOTE — Discharge Instructions (Signed)
Hyperglycemic Hyperosmolar State Hyperglycemic hyperosmolar state is a serious condition in which you experience an extreme increase in your blood sugar (glucose) level. This makes your body become extremely dehydrated, which can be life threatening.  This condition usually happens to people with type 2 diabetes mellitus. It may occur over a period of days or even weeks. It may occur in those who have not been diagnosed with diabetes mellitus or in those people who have not been able to control their diabetes mellitus for various reasons. Normally your kidneys try to get rid of extra glucose through urine. However, if you do not drink enough fluids or if you drink fluids that contain sugar, your kidneys will no longer be able to get rid of the extra glucose and your blood glucose levels will increase. CAUSES  This condition may be brought on by:  Infection.  Use of medicines that cause you to become dehydrated or cause you to lose fluid.  Other illnesses.  Not taking your diabetes medication. RISK FACTORS  Older age.  Poor management of diabetes mellitus.  Inability to eat or drink normally.  Heart failure.  Infection.  Surgery.  Illness. SYMPTOMS   Extreme or increased thirst (although this may gradually disappear).  Increased urination.  Dry, parched mouth.  Warm, dry skin that does not sweat.  High fever.  Sleepiness or confusion.  Vision problems or vision loss.  Seeing or hearing things that are not there (hallucinations).  Weakness on one side of the body. DIAGNOSIS  Hyperglycemic hyperosmolar state is diagnosed with a medical history and evaluation of laboratory test results.  TREATMENT  The goal of treatment is to correct the dehydration. Fluids are replaced through an IV tube.  HOME CARE INSTRUCTIONS  Check your blood glucose level regularly.  Talk with your health care provider about when to check your blood glucose level and what the numbers  mean.  Check your blood glucose level more often when you are sick. Ask your health care provider about your sick day plan.  Always stay well hydrated especially when you are ill, exercising, or you are out in the heat. SEEK MEDICAL CARE:  If you are ill and cannot eat or drink.  If you develop fever. SEEK IMMEDIATE MEDICAL CARE: If you develop any of the symptoms listed above of hyperglycemic hyperosmolar state. MAKE SURE YOU:  Understand these instructions.   Will watch your condition.   Will get help right away if you are not doing well or get worse. Document Released: 04/25/2004 Document Revised: 02/23/2013 Document Reviewed: 11/30/2012 [facility] Patient Information 2014 [place], [place].

## 2013-11-13 NOTE — Progress Notes (Signed)
Discontinued telemetry per Dr. Catha Gosselin. Telemetry box returned to nurse's station.  Murry Diaz Arvella Nigh, RN

## 2013-11-13 NOTE — Progress Notes (Signed)
Patient to be discharged to home with his wife. Discharge instructions and prescriptions reviewed with patient. Telemetry was removed and returned to the nurse's station this morning. No PIV access at time of discharge. Patient and patient's wife understand the importance of scheduling a follow-up appt with GI and PCP as soon as possible.   Christian Wall Christian Wall

## 2013-11-13 NOTE — Discharge Summary (Signed)
Physician Discharge Summary  Christian Wall MLY:[id number] DOB: [birth year] DOA: 11/09/2013  [name], Provider, MD  Admit date: 11/09/2013 Discharge date: 11/13/2013  Time spent: 45 minutes  Recommendations for Outpatient Follow-up:  Patient will be discharged to home. He spoke with his primary care physician within one week of discharge as well as Dr. Hilarie Fredrickson for an EGD. Patient will need to call Dr. Vena Rua office to set this up.  This information has been given to the patient.  Patient will also need to discuss with his primary care physician a followup MRI for his renal lesion.  Patient should continue his medications as prescribed. He should follow a low-carb, heart healthy diet. Patient should abstain from alcohol use.  Discharge Diagnoses:  Active Problems:   Hyperosmolar non-ketotic state in patient with type 2 diabetes mellitus   ARF (acute renal failure)   Chest pain   Dehydration   Non-adherence to medical treatment   Poor venous access   Anemia   Abnormal EKG   Acute Metabolic encephalopathy   Alcoholism   Esophageal thickening   Gastric wall thickening   Left Renal lesion   Discharge Condition: Stable  Diet recommendation: Heart healthy, carb modified  Filed Weights   11/09/13 2150  Weight: 89.7 kg (197 lb 12 oz)    History of present illness:  Christian Wall is a 53 y.o. 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 Patient states that he has quit drinking alcohol for 2-3 years and 4 days ago he had gone out with his friend and had some alcohol following which he started having nausea and had one episode of vomiting. Since then he has been having heartburn and epigastric discomfort. He has been having constant hiccups. He felt weak and tired and came to the ER. In the ER patient was found to have blood sugars in the thousands and anion gap and severe  hyponatremia and hyperkalemia. Patient was given 3-4 L of normal sitting bolus and started on IV insulin infusion for hyperosmolar state. Cardiac markers and LFTs were normal. EKG was unremarkable. Chest x-ray and CT abdomen is pending and patient will be admitted for further management. Patient denies any headache visual symptoms any focal deficits.   Hospital Course: Hyperosmolar nonketotic state in patient with underlying type 2 diabetes mellitus  -Patient was initially admitted to step down unit and placed on IV fluid as well as insulin  -Patient's CBGs were monitored and have trended downward  -Patient was transitioned to long-acting insulin as well as insulin sliding scale  -He was restarted on Actos, however his metformin was held  -Patient was initially n.p.o. however his diet was advanced and tolerated it well.  -Patient has underlying alcohol abuse which may have precipitated his [place]  -Patient may resume his home medications, however, will also discharge patient with Lantus 70units as well as sliding scale insulin, blood glucose kit/strips.   Metabolic acidosis  -Secondary to be above   Acute renal failure secondary to dehydration  -Resolved with IV fluid hydration  -Creatinine currently 8.12  Acute metabolic encephalopathy  -Likely secondary to hyperglycemia with azotemia  -Resolved, patient is not baseline   Chest pain  -Resolved  -Likely secondary to gastroparesis from severe hyperglycemia  -Echocardiogram: EF 55-60%  -EKG showed nonspecific changes  -CT of the chest revealed a dilated thoracic esophagus with thickening concerning for esophagitis versus possible neoplasm  -Troponins remained negative  x3  -Patient did have some ST changes which are likely secondary to hyperkalemia from patient's renal failure   Dilated distal thoracic esophagus  -Noted on CT scan  -Gastroenterology, Dr. Hilarie Fredrickson has been consulted and recommended EGD -Patient IV access, cannot obtain  access at this time. EGD will be conducted as an outpatient.  -Continue PPI  -Patient is currently asymptomatic at this point   Anemia  -Anemia panel: Iron 100, Feritin 229  -Hemoglobin 10.2  -Drop in Hb may also be secondary to dilution from IVF    Left renal lesion  -2.6 x 2.3cm lesion noted on CT abd  -Patient will need followup MRI (nonemergent)   Alcoholism  -Currently on Sular protocol  -CT reveals chronic calcific pancreatitis  -Patient counseled   ? Nonadherence to medical treatment  -Patient stated he was frustrated by frequent need to check blood sugars at home   Procedures: Echocardiogram Study Conclusions - Left ventricle: May have a slight septal bounce noted, but ventricular function is good. The cavity size was normal. Systolic function was normal. The estimated ejection fraction was in the range of 55% to 60%. Wall motion was normal; there were no regional wall motion abnormalities. - Pericardium, extracardiac: A trivial pericardial effusion was identified.  Consultations: Gastroenterology  Discharge Exam: Filed Vitals:   11/13/13 1005  BP: 135/71  Pulse: 104  Temp: 99.5 F (37.5 C)  Resp: 18   Exam  General: Well developed, well nourished, NAD, appears stated age  HEENT: NCAT, PERRLA, EOMI, Anicteic Sclera, mucous membranes moist.  Neck: Supple, no JVD, no masses  Cardiovascular: S1 S2 auscultated, no rubs, murmurs or gallops. Regular rate and rhythm.  Respiratory: Clear to auscultation bilaterally with equal chest rise  Abdomen: Soft, nontender, nondistended, + bowel sounds  Extremities: warm dry without cyanosis clubbing or edema  Neuro: AAOx3, cranial nerves grossly intact. Strength 5/5 in patient's upper and lower extremities bilaterally  Skin: Without rashes exudates or nodules  Psych: Normal affect and demeanor with intact judgement and insight  Discharge Instructions     Medication List         CVS BLOOD GLUCOSE METER W/DEVICE Kit    Check blood sugars 4 times daily, with meals and at bedtime.     esomeprazole 40 MG capsule  Commonly known as:  NEXIUM  Take 40 mg by mouth daily before breakfast.     glucose blood test strip  Commonly known as:  CVS BLOOD GLUCOSE TEST STRIPS  Use as instructed     insulin aspart 100 UNIT/ML injection  Commonly known as:  novoLOG  Inject 3 Units into the skin 3 (three) times daily with meals.     insulin aspart 100 UNIT/ML injection  Commonly known as:  novoLOG  - Inject 0-15 Units into the skin 3 (three) times daily with meals. CBG 70 - 120: 0 units  - CBG 121 - 150: 2 units  - CBG 151 - 200: 3 units  - CBG 201 - 250: 5 units  - CBG 251 - 300: 8 units  - CBG 301 - 350: 11 units  - CBG 351 - 400: 15 units     insulin glargine 100 UNIT/ML injection  Commonly known as:  LANTUS  Inject 0.7 mLs (70 Units total) into the skin daily.     lipase/protease/amylase 12000 UNITS Cpep capsule  Commonly known as:  CREON-12/PANCREASE  Take 1-3 capsules by mouth 2 (two) times daily. 3 capsules before meal, and 1 capsule before snack  PATADAY 0.2 % Soln  Generic drug:  Olopatadine HCl  Apply 1 drop to eye daily as needed. For allergies     pioglitazone-metformin 15-850 MG per tablet  Commonly known as:  ACTOPLUS MET  Take 1 tablet by mouth 2 (two) times daily with a meal.     pregabalin 150 MG capsule  Commonly known as:  LYRICA  Take 150 mg by mouth 2 (two) times daily.     thiamine 100 MG tablet  Take 1 tablet (100 mg total) by mouth daily.       No Known Allergies Follow-up Information   Follow up with Primary care provider. Schedule an appointment as soon as possible for a visit in 1 week. [facility] follow up)       Follow up with PYRTLE, Lajuan Lines, MD. Schedule an appointment as soon as possible for a visit in 1 week. (EGD)    Specialty:  Gastroenterology   Contact information:   [address] [place] [address] [phone]        The results of  significant diagnostics from this hospitalization (including imaging, microbiology, ancillary and laboratory) are listed below for reference.    Significant Diagnostic Studies: Ct Abdomen Pelvis Wo Contrast  11/09/2013   CLINICAL DATA:  Nausea, vomiting, decreased appetite, weakness, and heartburn for 1 week, history diabetes, GERD  EXAM: CT ABDOMEN AND PELVIS WITHOUT CONTRAST  TECHNIQUE: Multidetector CT imaging of the abdomen and pelvis was performed following the standard protocol without IV contrast. Patient drank dilute oral contrast for exam. Sagittal and coronal MPR images reconstructed from axial data set.  COMPARISON:  None  FINDINGS: Minimal atelectasis at lung bases.  Dilated esophagus containing contrast, with mild wall thickening of the distal esophagus, question related to reflux disease.  Stomach appears moderately well distended with suspect scattered gastric wall thickening, question gastritis, tumor and infiltrative process not entirely excluded.  Calcifications in pancreas compatible with chronic calcific pancreatitis.  Thickening of Gerota's fascia on LEFT.  Mildly ptotic LEFT kidney with a 4.4 x 3.3 cm diameter exophytic cyst at inferior pole and an additional indeterminate complex lower pole lesion 2.6 x 2.3 cm.  Tiny nonobstructing RIGHT renal calculi.  Within limits of a nonenhanced exam, no focal abnormalities of the liver, spleen, or adrenal glands.  Normal appendix.  Scattered atherosclerotic calcification.  Well distended normal appearing bladder with minimal prostatic enlargement.  Large and small bowel loops unremarkable.  No mass, adenopathy, free fluid, or free air.  No acute osseous findings.  Heterotopic calcifications is identified at the posterior pelvis bilaterally within luteal muscular planes, question related to prior muscular trauma or myositis ossificans.  IMPRESSION: Dilated distal thoracic esophagus with bowel wall thickening raising question of reflux esophagitis,  tumor less likely.  Additionally, gastric wall thickening extending into duodenal bulb region, favor gastritis over tumor though endoscopic assessment of both the esophagus and the stomach is recommended to exclude neoplasm.  Chronic calcific pancreatitis.  Cyst at inferior pole RIGHT kidney with an additional indeterminate 2.6 x 2.3 cm diameter inferior pole LEFT renal lesion ; characterization of this renal lesion by followup nonemergent MR abdomen with and without contrast recommended.   Electronically Signed   By: Lavonia Dana M.D.   On: 11/09/2013 21:28   Dg Chest Port 1 View  11/09/2013   CLINICAL DATA:  Short of breath. Chest burning. History of diabetes and hypertension.  EXAM: PORTABLE CHEST - 1 VIEW  COMPARISON:  DG CHEST 2 VIEW dated 04/17/2012  FINDINGS: Apical lordotic projection. Cardiopericardial silhouette within normal limits. Mediastinal contours normal. Trachea midline. No airspace disease or effusion. Calcification is present around the distal left clavicle, likely from prior trauma. Monitoring leads project over the chest. Exostosis/osteochondroma projects off the medial left proximal humeral metaphysis.  IMPRESSION: No acute cardiopulmonary disease.   Electronically Signed   By: Dereck Ligas M.D.   On: 11/09/2013 20:25    Microbiology: Recent Results (from the past 240 hour(s))  MRSA PCR SCREENING     Status: None   Collection Time    11/09/13 10:00 PM      Result Value Ref Range Status   MRSA by PCR NEGATIVE  NEGATIVE Final   Comment:            The GeneXpert MRSA Assay (FDA     approved for NASAL specimens     only), is one component of a     comprehensive MRSA colonization     surveillance program. It is not     intended to diagnose MRSA     infection nor to guide or     monitor treatment for     MRSA infections.     Labs: Basic Metabolic Panel:  Recent Labs Lab 11/10/13 0405 11/11/13 0330 11/12/13 0200 11/12/13 0710 11/13/13 0530  NA 125* 134* 135* 137  139  K 5.2 4.4 4.6 4.1 4.9  CL 87* 95* 101 101 101  CO2 26 24 19 21 23   GLUCOSE 213* 238* 165* 143* 225*  BUN 104* 53* 26* 23 20  CREATININE 2.14* 1.22 0.97 0.95 0.98  CALCIUM 8.1* 7.5* 7.6* 8.0* 8.4   Liver Function Tests:  Recent Labs Lab 11/09/13 1642 11/11/13 0330  AST 33 24  ALT 14 21  ALKPHOS 114 118*  BILITOT 0.5 0.6  PROT 7.6 6.3  ALBUMIN 3.7 3.1*    Recent Labs Lab 11/09/13 1642 11/10/13 0152  LIPASE 47 33   No results found for this basename: AMMONIA,  in the last 168 hours CBC:  Recent Labs Lab 11/09/13 1642 11/09/13 1913 11/10/13 0152 11/11/13 0330 11/13/13 0530  WBC 8.3  --  7.9 6.9 10.6*  NEUTROABS 6.6  --  5.9  --   --   HGB 13.3 15.0 11.9* 10.6* 10.2*  HCT 40.6 44.0 34.3* 32.9* 32.4*  MCV 71.6*  --  67.7* 70.9* 73.3*  PLT 270  --  198 181 227   Cardiac Enzymes:  Recent Labs Lab 11/09/13 2220 11/10/13 0405 11/10/13 1100  TROPONINI <0.30 <0.30 <0.30   BNP: BNP (last 3 results) No results found for this basename: PROBNP,  in the last 8760 hours CBG:  Recent Labs Lab 11/12/13 0806 11/12/13 1113 11/12/13 1648 11/12/13 2019 11/13/13 0834  GLUCAP 143* 178* 179* 243* 250*    Signed:  Zarianna Dicarlo  [facility] 11/13/2013, 10:11 AM

## 2013-11-13 NOTE — Progress Notes (Signed)
Discussed with Dr. Catha Gosselin, pt lost IV access and no availability for PICC placement today.  Will not be able to perform EGD today for this reason.  Dr. Catha Gosselin will plan d/c home today and I will arrange outpatient EGD.

## 2013-11-14 ENCOUNTER — Telehealth: Payer: Self-pay

## 2013-11-14 NOTE — Telephone Encounter (Signed)
No answer.  I will continue to try and reach the patient 

## 2013-11-14 NOTE — Telephone Encounter (Signed)
Message copied by Annett Fabian on Mon Nov 14, 2013  3:48 PM ------      Message from: Beverley Fiedler      Created: Sun Nov 13, 2013  9:54 AM      Regarding: Outpatient EGD       Pt needs outpatient EGD in the [facility] for abnl GI imaging and GERD.       ------

## 2013-11-16 NOTE — Telephone Encounter (Signed)
Patient notified.  He is scheduled for EGD 5/26 and pre-visit on 5/21

## 2013-11-24 ENCOUNTER — Ambulatory Visit (AMBULATORY_SURGERY_CENTER): Payer: Self-pay | Admitting: *Deleted

## 2013-11-24 ENCOUNTER — Telehealth: Payer: Self-pay | Admitting: *Deleted

## 2013-11-24 VITALS — Ht 69.0 in | Wt 203.6 lb

## 2013-11-24 DIAGNOSIS — K219 Gastro-esophageal reflux disease without esophagitis: Secondary | ICD-10-CM

## 2013-11-24 DIAGNOSIS — R933 Abnormal findings on diagnostic imaging of other parts of digestive tract: Secondary | ICD-10-CM

## 2013-11-24 NOTE — Progress Notes (Signed)
No allergies to eggs or soy. No problems with anesthesia.  Pt NOT given Emmi instructions for EGD.  No computer access  No oxygen use  No diet drug use

## 2013-11-24 NOTE — Telephone Encounter (Signed)
Christian Wall:  This pt is scheduled for EGD Tuesday 5/26.  Pt has remote hx of having trach.  Does not have now.  Pt has had surgery since closing of trach with not problems.  Neck surgery in 2013 with anesthesia record is in [facility] for review.  Please advise.  Thanks, [employer]

## 2013-11-24 NOTE — Telephone Encounter (Signed)
Christian Wall,  He is cleared for care at [facility],  [name]

## 2013-11-25 NOTE — Telephone Encounter (Signed)
noted 

## 2013-11-29 ENCOUNTER — Encounter: Payer: Self-pay | Admitting: Internal Medicine

## 2013-11-29 ENCOUNTER — Ambulatory Visit (AMBULATORY_SURGERY_CENTER): Payer: Medicare Other | Admitting: Internal Medicine

## 2013-11-29 VITALS — BP 123/71 | HR 86 | Temp 97.6°F | Resp 16 | Ht 69.0 in | Wt 203.0 lb

## 2013-11-29 DIAGNOSIS — B3781 Candidal esophagitis: Secondary | ICD-10-CM

## 2013-11-29 DIAGNOSIS — K3189 Other diseases of stomach and duodenum: Secondary | ICD-10-CM

## 2013-11-29 DIAGNOSIS — K319 Disease of stomach and duodenum, unspecified: Secondary | ICD-10-CM

## 2013-11-29 DIAGNOSIS — R933 Abnormal findings on diagnostic imaging of other parts of digestive tract: Secondary | ICD-10-CM

## 2013-11-29 DIAGNOSIS — K2940 Chronic atrophic gastritis without bleeding: Secondary | ICD-10-CM

## 2013-11-29 LAB — GLUCOSE, CAPILLARY
GLUCOSE-CAPILLARY: 68 mg/dL — AB (ref 70–99)
GLUCOSE-CAPILLARY: 77 mg/dL (ref 70–99)
Glucose-Capillary: 102 mg/dL — ABNORMAL HIGH (ref 70–99)

## 2013-11-29 MED ORDER — FLUCONAZOLE 100 MG PO TABS: 100.0000 mg | ORAL_TABLET | Freq: Every day | ORAL | Status: DC

## 2013-11-29 MED ORDER — SODIUM CHLORIDE 0.9 % IV SOLN: 500.0000 mL | INTRAVENOUS | Status: DC

## 2013-11-29 NOTE — Progress Notes (Signed)
Called to room to assist during endoscopic procedure.  Patient ID and intended procedure confirmed with present staff. Received instructions for my participation in the procedure from the performing physician.  

## 2013-11-29 NOTE — Progress Notes (Signed)
Pt had to wait for ride, pt called ride and she will call when she is here-adm.

## 2013-11-29 NOTE — Progress Notes (Signed)
A/ox3 pleased with MAC, report to April.

## 2013-11-29 NOTE — Progress Notes (Signed)
[id number] [id number]

## 2013-11-29 NOTE — Op Note (Signed)
[facility] [address] [place], [address]   ENDOSCOPY PROCEDURE REPORT  PATIENT: Christian Wall, Christian Wall  MR#: [id number] BIRTHDATE: September 06, 1960 , 52  yrs. old GENDER: Male ENDOSCOPIST: Beverley Fiedler, MD PROCEDURE DATE:  11/29/2013 PROCEDURE:  EGD w/ biopsy ASA CLASS:     Class III INDICATIONS:  Epigastric pain.   abnormal CT of the GI tract. MEDICATIONS: MAC sedation, administered by CRNA and propofol (Diprivan) 200mg  IV TOPICAL ANESTHETIC: none  DESCRIPTION OF PROCEDURE: After the risks benefits and alternatives of the procedure were thoroughly explained, informed consent was obtained.  The [id number] endoscope was introduced through the mouth and advanced to the second portion of the duodenum. Without limitations.  The instrument was slowly withdrawn as the mucosa was fully examined.   ESOPHAGUS: White exudates consistent with candidiasis were found in the middle third of the esophagus and lower third of the esophagus. There was moderate to severe esophagitis.  A 3 cm hiatal hernia was noted.  STOMACH: Mild mucosal edema was found in the proximal stomach without ulceration or erosions.  Multiple biopsies were performed from the proximal and distal stomach using cold forceps.  Sample sent for histology.  DUODENUM: The duodenal mucosa showed no abnormalities in the bulb and second portion of the duodenum. Retroflexed views revealed a hiatal hernia.     The scope was then withdrawn from the patient and the procedure completed.  COMPLICATIONS: There were no complications.  ENDOSCOPIC IMPRESSION: 1.   White exudates consistent with candida esophagitis 2.   3 cm hiatal hernia 3.   Mucosal edema was found in the stomach; multiple biopsies 4.   The duodenal mucosa showed no abnormalities in the bulb and second portion of the duodenum  RECOMMENDATIONS: 1.  Await biopsy results 2.  Fluconazole 200 mg x1 day and then 100 mg x 20 days for  Candida esophagitis 3.  Continue daily PPI 4.  Office followup in 8 weeks   eSigned:  Beverley Fiedler, MD 11/29/2013 9:09 AM     CC:The Patient and Fleet Contras, MD

## 2013-11-29 NOTE — Progress Notes (Signed)
0

## 2013-11-29 NOTE — Patient Instructions (Addendum)

## 2013-11-30 ENCOUNTER — Telehealth: Payer: Self-pay

## 2013-11-30 NOTE — Telephone Encounter (Signed)
  Follow up Call-  Call back number 11/29/2013  Post procedure Call Back phone  # [phone]  Permission to leave phone message Yes     Patient questions:  Do you have a fever, pain , or abdominal swelling? no Pain Score  0 *  Have you tolerated food without any problems? yes  Have you been able to return to your normal activities? yes  Do you have any questions about your discharge instructions: Diet   no Medications  no Follow up visit  no  Do you have questions or concerns about your Care? no  Actions: * If pain score is 4 or above: No action needed, pain <4.

## 2013-12-05 ENCOUNTER — Encounter: Payer: Self-pay | Admitting: Internal Medicine

## 2014-11-05 DIAGNOSIS — N179 Acute kidney failure, unspecified: Secondary | ICD-10-CM

## 2014-11-05 HISTORY — DX: Acute kidney failure, unspecified: N17.9

## 2014-11-12 ENCOUNTER — Encounter (HOSPITAL_COMMUNITY): Payer: Self-pay | Admitting: Emergency Medicine

## 2014-11-12 ENCOUNTER — Inpatient Hospital Stay (HOSPITAL_COMMUNITY)
Admission: EM | Admit: 2014-11-12 | Discharge: 2014-11-21 | DRG: 286 | Disposition: A | Discharge status: Home Health | Payer: Medicare Other | Attending: Internal Medicine | Admitting: Internal Medicine

## 2014-11-12 ENCOUNTER — Emergency Department (HOSPITAL_COMMUNITY): Payer: Medicare Other

## 2014-11-12 DIAGNOSIS — G479 Sleep disorder, unspecified: Secondary | ICD-10-CM | POA: Diagnosis present

## 2014-11-12 DIAGNOSIS — I248 Other forms of acute ischemic heart disease: Secondary | ICD-10-CM | POA: Diagnosis present

## 2014-11-12 DIAGNOSIS — I447 Left bundle-branch block, unspecified: Secondary | ICD-10-CM | POA: Diagnosis not present

## 2014-11-12 DIAGNOSIS — M7989 Other specified soft tissue disorders: Secondary | ICD-10-CM | POA: Diagnosis present

## 2014-11-12 DIAGNOSIS — R0602 Shortness of breath: Secondary | ICD-10-CM | POA: Diagnosis not present

## 2014-11-12 DIAGNOSIS — D509 Iron deficiency anemia, unspecified: Secondary | ICD-10-CM | POA: Diagnosis present

## 2014-11-12 DIAGNOSIS — Z515 Encounter for palliative care: Secondary | ICD-10-CM

## 2014-11-12 DIAGNOSIS — Z833 Family history of diabetes mellitus: Secondary | ICD-10-CM

## 2014-11-12 DIAGNOSIS — N183 Chronic kidney disease, stage 3 (moderate): Secondary | ICD-10-CM | POA: Diagnosis present

## 2014-11-12 DIAGNOSIS — F17210 Nicotine dependence, cigarettes, uncomplicated: Secondary | ICD-10-CM | POA: Diagnosis present

## 2014-11-12 DIAGNOSIS — E11649 Type 2 diabetes mellitus with hypoglycemia without coma: Secondary | ICD-10-CM | POA: Diagnosis present

## 2014-11-12 DIAGNOSIS — Z6832 Body mass index (BMI) 32.0-32.9, adult: Secondary | ICD-10-CM

## 2014-11-12 DIAGNOSIS — I509 Heart failure, unspecified: Secondary | ICD-10-CM

## 2014-11-12 DIAGNOSIS — R570 Cardiogenic shock: Secondary | ICD-10-CM | POA: Diagnosis present

## 2014-11-12 DIAGNOSIS — E1165 Type 2 diabetes mellitus with hyperglycemia: Secondary | ICD-10-CM | POA: Diagnosis present

## 2014-11-12 DIAGNOSIS — E780 Pure hypercholesterolemia: Secondary | ICD-10-CM | POA: Diagnosis present

## 2014-11-12 DIAGNOSIS — E875 Hyperkalemia: Secondary | ICD-10-CM | POA: Diagnosis not present

## 2014-11-12 DIAGNOSIS — Z794 Long term (current) use of insulin: Secondary | ICD-10-CM

## 2014-11-12 DIAGNOSIS — I5041 Acute combined systolic (congestive) and diastolic (congestive) heart failure: Secondary | ICD-10-CM | POA: Diagnosis present

## 2014-11-12 DIAGNOSIS — M25561 Pain in right knee: Secondary | ICD-10-CM | POA: Diagnosis present

## 2014-11-12 DIAGNOSIS — R0600 Dyspnea, unspecified: Secondary | ICD-10-CM

## 2014-11-12 DIAGNOSIS — G629 Polyneuropathy, unspecified: Secondary | ICD-10-CM

## 2014-11-12 DIAGNOSIS — I130 Hypertensive heart and chronic kidney disease with heart failure and stage 1 through stage 4 chronic kidney disease, or unspecified chronic kidney disease: Secondary | ICD-10-CM | POA: Diagnosis not present

## 2014-11-12 DIAGNOSIS — I5021 Acute systolic (congestive) heart failure: Secondary | ICD-10-CM

## 2014-11-12 DIAGNOSIS — Z8249 Family history of ischemic heart disease and other diseases of the circulatory system: Secondary | ICD-10-CM

## 2014-11-12 DIAGNOSIS — E669 Obesity, unspecified: Secondary | ICD-10-CM | POA: Diagnosis present

## 2014-11-12 DIAGNOSIS — N179 Acute kidney failure, unspecified: Secondary | ICD-10-CM | POA: Diagnosis present

## 2014-11-12 DIAGNOSIS — K219 Gastro-esophageal reflux disease without esophagitis: Secondary | ICD-10-CM | POA: Diagnosis present

## 2014-11-12 DIAGNOSIS — IMO0002 Reserved for concepts with insufficient information to code with codable children: Secondary | ICD-10-CM | POA: Diagnosis present

## 2014-11-12 LAB — CBC WITH DIFFERENTIAL/PLATELET
BASOS PCT: 0 % (ref 0–1)
Basophils Absolute: 0 10*3/uL (ref 0.0–0.1)
EOS ABS: 0.2 10*3/uL (ref 0.0–0.7)
EOS PCT: 2 % (ref 0–5)
HCT: 33.2 % — ABNORMAL LOW (ref 39.0–52.0)
HEMOGLOBIN: 10.3 g/dL — AB (ref 13.0–17.0)
LYMPHS PCT: 13 % (ref 12–46)
Lymphs Abs: 1.1 10*3/uL (ref 0.7–4.0)
MCH: 21.3 pg — ABNORMAL LOW (ref 26.0–34.0)
MCHC: 31 g/dL (ref 30.0–36.0)
MCV: 68.6 fL — ABNORMAL LOW (ref 78.0–100.0)
Monocytes Absolute: 0.9 10*3/uL (ref 0.1–1.0)
Monocytes Relative: 10 % (ref 3–12)
NEUTROS PCT: 75 % (ref 43–77)
Neutro Abs: 6.5 10*3/uL (ref 1.7–7.7)
Platelets: 432 10*3/uL — ABNORMAL HIGH (ref 150–400)
RBC: 4.84 MIL/uL (ref 4.22–5.81)
RDW: 19.9 % — ABNORMAL HIGH (ref 11.5–15.5)
WBC: 8.7 10*3/uL (ref 4.0–10.5)

## 2014-11-12 LAB — BRAIN NATRIURETIC PEPTIDE: B NATRIURETIC PEPTIDE 5: 455.8 pg/mL — AB (ref 0.0–100.0)

## 2014-11-12 MED ORDER — SODIUM CHLORIDE 0.9 % IV BOLUS (SEPSIS)
500.0000 mL | Freq: Once | INTRAVENOUS | Status: AC
  Administered 2014-11-13: 500 mL via INTRAVENOUS

## 2014-11-12 MED ORDER — HYDROMORPHONE HCL 1 MG/ML IJ SOLN
0.5000 mg | Freq: Once | INTRAMUSCULAR | Status: DC
  Administered 2014-11-13: 0.5 mg via INTRAVENOUS
  Filled 2014-11-12: qty 1

## 2014-11-12 NOTE — ED Notes (Signed)
Informed the pt a urine specimen is needed. 

## 2014-11-12 NOTE — ED Provider Notes (Signed)
CSN: [id number]     Arrival date & time 11/12/14  2118 History   First MD Initiated Contact with Patient 11/12/14 2145     Chief Complaint  Patient presents with  . Leg Swelling  . Abdominal Pain  . Shortness of Breath     (Consider location/radiation/quality/duration/timing/severity/associated sxs/prior Treatment) Patient is a 54 y.o. male presenting with abdominal pain and shortness of breath. The history is provided by the patient and the spouse. No language interpreter was used.  Abdominal Pain Associated symptoms: no chest pain, no constipation, no cough, no diarrhea, no nausea, no shortness of breath and no vomiting   Shortness of Breath Associated symptoms: abdominal pain   Associated symptoms: no chest pain, no cough and no vomiting   [name] is a 54 y.o male with a history of DM, depression, GERD, peripheral neuropathy, hypercholesteremia, pancreatic surgery, history of tracheostomy who presents with right leg swelling that began one week ago and has worsened with abdominal swelling that began 2 days ago.  He states the abdomen and leg feels tight.  He states he also fell 2 days ago while getting out of the shower and injured his right leg.  He has not been able to flex the knee since.  He was recently put on lasix by his pcp, Dr. Nolene Ebbs but the records are not in our system.   He denies any fever, chest pain, shortness of breath, nausea, vomiting, constipation, diarrhea, or urinary symptoms.   Past Medical History  Diagnosis Date  . Diabetes mellitus   . Atrophy of calf muscles   . Depression   . GERD (gastroesophageal reflux disease)   . Peripheral neuropathy   . Hypercholesteremia   . Hx of tracheostomy    Past Surgical History  Procedure Laterality Date  . Pancreas surgery  2001    hx pancreatitis  . Anterior cervical decomp/discectomy fusion  04/13/2012    Procedure: ANTERIOR CERVICAL DECOMPRESSION/DISCECTOMY FUSION 1 LEVEL;  Surgeon: Charlie Pitter, MD;   Location: [facility] [place];  Service: Neurosurgery;  Laterality: N/A;  Cervcial Five-Six Anterior Cervical Decompression and Fusion with Allograft and Plating   Family History  Problem Relation Age of Onset  . Diabetes Mellitus II Neg Hx   . CAD Neg Hx   . Colon cancer Neg Hx    History  Substance Use Topics  . Smoking status: Current Every Day Smoker -- 1.00 packs/day for 30 years    Types: Cigarettes  . Smokeless tobacco: Never Used  . Alcohol Use: No    Review of Systems  Respiratory: Negative for cough and shortness of breath.   Cardiovascular: Negative for chest pain.  Gastrointestinal: Positive for abdominal pain. Negative for nausea, vomiting, diarrhea and constipation.  All other systems reviewed and are negative.     Allergies  Review of patient's allergies indicates no known allergies.  Home Medications   Prior to Admission medications   Medication Sig Start Date End Date Taking? Authorizing Provider  Amoxicillin-Pot Clavulanate (AUGMENTIN PO) Take by mouth 2 (two) times daily.    Historical Provider, MD  Blood Glucose Monitoring Suppl ([employer]S BLOOD GLUCOSE METER) W/DEVICE KIT Check blood sugars 4 times daily, with meals and at bedtime. 11/13/13   Maryann Mikhail, DO  CVS VITAMIN B12 1000 MCG tablet Take 1,000 mcg by mouth daily. 10/15/14  Yes Historical Provider, MD  DULoxetine (CYMBALTA) 60 MG capsule Take 60 mg by mouth daily.   Yes Historical Provider, MD  esomeprazole (NEXIUM) 40 MG  capsule Take 40 mg by mouth daily before breakfast.   Yes Historical Provider, MD  fluconazole (DIFLUCAN) 100 MG tablet Take 1 tablet (100 mg total) by mouth daily. Patient not taking: Reported on 11/12/2014 11/29/13   Jerene Bears, MD  furosemide (LASIX) 20 MG tablet Take 20 mg by mouth daily.   Yes Historical Provider, MD  glucose blood (CVS BLOOD GLUCOSE TEST STRIPS) test strip Use as instructed 11/13/13   Maryann Mikhail, DO  insulin aspart (NOVOLOG) 100 UNIT/ML injection Inject 3 Units into  the skin 3 (three) times daily with meals. Patient not taking: Reported on 11/12/2014 11/13/13   Maryann Mikhail, DO  insulin aspart (NOVOLOG) 100 UNIT/ML injection Inject 0-15 Units into the skin 3 (three) times daily with meals. CBG 70 - 120: 0 units CBG 121 - 150: 2 units CBG 151 - 200: 3 units CBG 201 - 250: 5 units CBG 251 - 300: 8 units CBG 301 - 350: 11 units CBG 351 - 400: 15 units 11/13/13  Yes Maryann Mikhail, DO  insulin glargine (LANTUS) 100 UNIT/ML injection Inject 0.7 mLs (70 Units total) into the skin daily. Patient taking differently: Inject 30-50 Units into the skin 2 (two) times daily. 50 units in the am and 20 units in the pm 11/13/13  Yes Maryann Mikhail, DO  lipase/protease/amylase (CREON-10/PANCREASE) 12000 UNITS CPEP Take 1-3 capsules by mouth 2 (two) times daily. 3 capsules before meal, and 1 capsule before snack   Yes Historical Provider, MD  lisinopril-hydrochlorothiazide (PRINZIDE,ZESTORETIC) 20-12.5 MG per tablet Take 1 tablet by mouth daily.   Yes Historical Provider, MD  metFORMIN (GLUCOPHAGE) 850 MG tablet Take 850 mg by mouth 2 (two) times daily with a meal.   Yes Historical Provider, MD  Olopatadine HCl (PATADAY) 0.2 % SOLN Apply 1 drop to eye daily as needed. For allergies   Yes Historical Provider, MD  pioglitazone (ACTOS) 15 MG tablet Take 15 mg by mouth daily.   Yes Historical Provider, MD  pregabalin (LYRICA) 150 MG capsule Take 150 mg by mouth 2 (two) times daily.   Yes Historical Provider, MD  simvastatin (ZOCOR) 10 MG tablet Take 10 mg by mouth daily at 6 PM.   Yes Historical Provider, MD  thiamine 100 MG tablet Take 1 tablet (100 mg total) by mouth daily. Patient not taking: Reported on 11/12/2014 11/13/13   Maryann Mikhail, DO  tiZANidine (ZANAFLEX) 4 MG tablet Take 4 mg by mouth 2 (two) times daily as needed for muscle spasms.   Yes Historical Provider, MD  traMADol (ULTRAM) 50 MG tablet Take 50 mg by mouth every 6 (six) hours as needed for moderate pain.     Yes Historical Provider, MD   BP 130/86 mmHg  Pulse 115  Temp(Src) 98.1 F (36.7 C) (Oral)  Resp 19  SpO2 92% Physical Exam  Constitutional: He is oriented to person, place, and time. He appears well-developed and well-nourished.  HENT:  Head: Normocephalic and atraumatic.  Eyes: Conjunctivae are normal.  Neck: Normal range of motion. Neck supple.  Cardiovascular: Regular rhythm and normal heart sounds.  Tachycardia present.   Pulmonary/Chest: Effort normal and breath sounds normal. No respiratory distress. He has no wheezes. He has no rales.  Abdominal: Soft. He exhibits distension. There is no tenderness.  No tenderness to palpation.  He has a midline abdominal surgical scar and 2 small scars at the bilateral mid clavicular lines above the umbilicus.   Musculoskeletal: Normal range of motion.  Right knee: Tenderness to palpation.  No effusion, no erythema, no warmth. He is unable to flex or extend knee.   Neurological: He is alert and oriented to person, place, and time.  Skin: Skin is warm and dry.  Nursing note and vitals reviewed.   ED Course  Procedures (including critical care time) Labs Review Labs Reviewed  CBC WITH DIFFERENTIAL/PLATELET - Abnormal; Notable for the following:    Hemoglobin 10.3 (*)    HCT 33.2 (*)    MCV 68.6 (*)    MCH 21.3 (*)    RDW 19.9 (*)    Platelets 432 (*)    All other components within normal limits  BRAIN NATRIURETIC PEPTIDE - Abnormal; Notable for the following:    B Natriuretic Peptide 455.8 (*)    All other components within normal limits  COMPREHENSIVE METABOLIC PANEL - Abnormal; Notable for the following:    Glucose, Bld 48 (*)    BUN 46 (*)    Creatinine, Ser 1.71 (*)    Alkaline Phosphatase 390 (*)    Total Bilirubin 1.4 (*)    GFR calc non Af Amer 44 (*)    GFR calc Af Amer 51 (*)    All other components within normal limits  LIPASE, BLOOD - Abnormal; Notable for the following:    Lipase 17 (*)    All other components  within normal limits  TROPONIN I - Abnormal; Notable for the following:    Troponin I 0.07 (*)    All other components within normal limits  CBG MONITORING, ED - Abnormal; Notable for the following:    Glucose-Capillary 51 (*)    All other components within normal limits  URINALYSIS, ROUTINE W REFLEX MICROSCOPIC    Imaging Review Dg Chest 2 View  11/12/2014   CLINICAL DATA:  Dyspnea, onset today. Lower extremity swelling for 1 week.  EXAM: CHEST  2 VIEW  COMPARISON:  11/09/2013  FINDINGS: There is moderate cardiomegaly and pulmonary hyperinflation. There is mild interstitial fluid or thickening. There is no confluent airspace opacity. There is no pleural effusion.  IMPRESSION: Cardiomegaly and hyperinflation. Mild interstitial thickening in the basilar periphery could represent mild congestive failure.   Electronically Signed   By: Andreas Newport M.D.   On: 11/12/2014 23:20   Dg Knee Complete 4 Views Right  11/12/2014   CLINICAL DATA:  Abdominal swelling. Right knee pain and swelling for 1 week. Also shortness of breath today.  EXAM: RIGHT KNEE - COMPLETE 4+ VIEW  COMPARISON:  None.  FINDINGS: Deformity and heterotopic ossification around the medial right femoral condyle, possibly representing old fracture deformity. Mild degenerative changes in the right knee. Osseous fragments in the anterior joint on the lateral view may represent foreign bodies. No significant effusion. No evidence of acute fracture or dislocation.  IMPRESSION: Deformity and heterotopic calcification at the medial femoral condyle and metaphysis probably representing old fracture deformity. Mild degenerative changes. No acute bony abnormalities.   Electronically Signed   By: Lucienne Capers M.D.   On: 11/12/2014 23:18     EKG Interpretation   Date/Time:  Sunday Nov 12 2014 22:40:02 EDT Ventricular Rate:  116 PR Interval:  147 QRS Duration: 87 QT Interval:  337 QTC Calculation: 468 R Axis:   55 Text Interpretation:   Sinus tachycardia Anterior infarct, old Borderline T  abnormalities, inferior leads Baseline wander in lead(s) V6 T wave changes  new compared to May 2015 Confirmed by Regenia Skeeter  MD, [place] [phone]) on  11/12/2014 11:34:17 PM  MDM   Final diagnoses:  Acute congestive heart failure, unspecified congestive heart failure type  Patient is complaining of abdominal distention and right leg swelling.  He states he injured his right knee by falling in the shower 2 days ago. 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   He appears tachycardic but stable.  He is hypoxic and running in the low 90's.  I have put him on 2L of oxygen.  He appears to be in mild CHF on CXR.  His BNP is 500. His glucose is 48.  We have given him something to eat and drink.  Xray of the right knee shows no acute fracture.   1:00 I spoke to Dr. Arnoldo Morale regarding CHF and she will admit to med surg. I have started IV lasix.      Ottie Glazier, PA-C 11/13/14 3578  Sherwood Gambler, MD 11/16/14 [phone]

## 2014-11-12 NOTE — ED Notes (Signed)
Pt from home c/o  Bilateral leg swelling and abdominal distention. Abdomen is taunt.  This has been going on x 2 weeks. He called PCP and they prescribed lasix. Pt reports increased pain in legs.

## 2014-11-12 NOTE — ED Notes (Signed)
Pt is aware that urine is needed for a sample, urinal at bedside

## 2014-11-13 ENCOUNTER — Ambulatory Visit (HOSPITAL_COMMUNITY): Payer: Medicare Other

## 2014-11-13 ENCOUNTER — Encounter (HOSPITAL_COMMUNITY): Payer: Self-pay | Admitting: Internal Medicine

## 2014-11-13 DIAGNOSIS — R0602 Shortness of breath: Secondary | ICD-10-CM | POA: Diagnosis present

## 2014-11-13 DIAGNOSIS — E11649 Type 2 diabetes mellitus with hypoglycemia without coma: Secondary | ICD-10-CM | POA: Diagnosis present

## 2014-11-13 DIAGNOSIS — I5031 Acute diastolic (congestive) heart failure: Secondary | ICD-10-CM

## 2014-11-13 DIAGNOSIS — I509 Heart failure, unspecified: Secondary | ICD-10-CM | POA: Diagnosis not present

## 2014-11-13 DIAGNOSIS — K219 Gastro-esophageal reflux disease without esophagitis: Secondary | ICD-10-CM | POA: Diagnosis present

## 2014-11-13 DIAGNOSIS — I10 Essential (primary) hypertension: Secondary | ICD-10-CM

## 2014-11-13 DIAGNOSIS — I5041 Acute combined systolic (congestive) and diastolic (congestive) heart failure: Secondary | ICD-10-CM | POA: Diagnosis present

## 2014-11-13 DIAGNOSIS — N189 Chronic kidney disease, unspecified: Secondary | ICD-10-CM | POA: Diagnosis not present

## 2014-11-13 DIAGNOSIS — N178 Other acute kidney failure: Secondary | ICD-10-CM | POA: Diagnosis not present

## 2014-11-13 DIAGNOSIS — Z6832 Body mass index (BMI) 32.0-32.9, adult: Secondary | ICD-10-CM | POA: Diagnosis not present

## 2014-11-13 DIAGNOSIS — Z8249 Family history of ischemic heart disease and other diseases of the circulatory system: Secondary | ICD-10-CM | POA: Diagnosis not present

## 2014-11-13 DIAGNOSIS — E780 Pure hypercholesterolemia: Secondary | ICD-10-CM | POA: Diagnosis present

## 2014-11-13 DIAGNOSIS — E1165 Type 2 diabetes mellitus with hyperglycemia: Secondary | ICD-10-CM | POA: Diagnosis present

## 2014-11-13 DIAGNOSIS — D509 Iron deficiency anemia, unspecified: Secondary | ICD-10-CM | POA: Diagnosis present

## 2014-11-13 DIAGNOSIS — IMO0002 Reserved for concepts with insufficient information to code with codable children: Secondary | ICD-10-CM | POA: Diagnosis present

## 2014-11-13 DIAGNOSIS — N182 Chronic kidney disease, stage 2 (mild): Secondary | ICD-10-CM

## 2014-11-13 DIAGNOSIS — G629 Polyneuropathy, unspecified: Secondary | ICD-10-CM | POA: Diagnosis not present

## 2014-11-13 DIAGNOSIS — N179 Acute kidney failure, unspecified: Secondary | ICD-10-CM | POA: Diagnosis present

## 2014-11-13 DIAGNOSIS — Z833 Family history of diabetes mellitus: Secondary | ICD-10-CM | POA: Diagnosis not present

## 2014-11-13 DIAGNOSIS — F17210 Nicotine dependence, cigarettes, uncomplicated: Secondary | ICD-10-CM | POA: Diagnosis present

## 2014-11-13 DIAGNOSIS — G479 Sleep disorder, unspecified: Secondary | ICD-10-CM | POA: Diagnosis present

## 2014-11-13 DIAGNOSIS — I447 Left bundle-branch block, unspecified: Secondary | ICD-10-CM | POA: Diagnosis not present

## 2014-11-13 DIAGNOSIS — E669 Obesity, unspecified: Secondary | ICD-10-CM | POA: Diagnosis present

## 2014-11-13 DIAGNOSIS — M25561 Pain in right knee: Secondary | ICD-10-CM | POA: Diagnosis present

## 2014-11-13 DIAGNOSIS — R531 Weakness: Secondary | ICD-10-CM | POA: Diagnosis not present

## 2014-11-13 DIAGNOSIS — R0600 Dyspnea, unspecified: Secondary | ICD-10-CM | POA: Diagnosis not present

## 2014-11-13 DIAGNOSIS — Z794 Long term (current) use of insulin: Secondary | ICD-10-CM | POA: Diagnosis not present

## 2014-11-13 DIAGNOSIS — Z515 Encounter for palliative care: Secondary | ICD-10-CM | POA: Diagnosis not present

## 2014-11-13 DIAGNOSIS — M7989 Other specified soft tissue disorders: Secondary | ICD-10-CM | POA: Diagnosis present

## 2014-11-13 DIAGNOSIS — E162 Hypoglycemia, unspecified: Secondary | ICD-10-CM

## 2014-11-13 DIAGNOSIS — R600 Localized edema: Secondary | ICD-10-CM | POA: Diagnosis not present

## 2014-11-13 DIAGNOSIS — I248 Other forms of acute ischemic heart disease: Secondary | ICD-10-CM | POA: Diagnosis present

## 2014-11-13 DIAGNOSIS — N183 Chronic kidney disease, stage 3 (moderate): Secondary | ICD-10-CM | POA: Diagnosis present

## 2014-11-13 DIAGNOSIS — I130 Hypertensive heart and chronic kidney disease with heart failure and stage 1 through stage 4 chronic kidney disease, or unspecified chronic kidney disease: Secondary | ICD-10-CM | POA: Diagnosis present

## 2014-11-13 DIAGNOSIS — R570 Cardiogenic shock: Secondary | ICD-10-CM | POA: Diagnosis present

## 2014-11-13 DIAGNOSIS — I5021 Acute systolic (congestive) heart failure: Secondary | ICD-10-CM | POA: Diagnosis not present

## 2014-11-13 DIAGNOSIS — E875 Hyperkalemia: Secondary | ICD-10-CM | POA: Diagnosis not present

## 2014-11-13 LAB — COMPREHENSIVE METABOLIC PANEL
ALT: 32 U/L (ref 17–63)
ANION GAP: 7 (ref 5–15)
AST: 40 U/L (ref 15–41)
Albumin: 3.5 g/dL (ref 3.5–5.0)
Alkaline Phosphatase: 390 U/L — ABNORMAL HIGH (ref 38–126)
BILIRUBIN TOTAL: 1.4 mg/dL — AB (ref 0.3–1.2)
BUN: 46 mg/dL — AB (ref 6–20)
CHLORIDE: 105 mmol/L (ref 101–111)
CO2: 26 mmol/L (ref 22–32)
CREATININE: 1.71 mg/dL — AB (ref 0.61–1.24)
Calcium: 9 mg/dL (ref 8.9–10.3)
GFR calc Af Amer: 51 mL/min — ABNORMAL LOW (ref >60)
GFR, EST NON AFRICAN AMERICAN: 44 mL/min — AB (ref >60)
Glucose, Bld: 48 mg/dL — ABNORMAL LOW (ref 70–99)
Potassium: 4.6 mmol/L (ref 3.5–5.1)
Sodium: 138 mmol/L (ref 135–145)
Total Protein: 7.3 g/dL (ref 6.5–8.1)

## 2014-11-13 LAB — URINALYSIS, ROUTINE W REFLEX MICROSCOPIC
BILIRUBIN URINE: NEGATIVE
GLUCOSE, UA: NEGATIVE mg/dL
HGB URINE DIPSTICK: NEGATIVE
Ketones, ur: NEGATIVE mg/dL
Leukocytes, UA: NEGATIVE
NITRITE: NEGATIVE
PH: 5.5 (ref 5.0–8.0)
Protein, ur: NEGATIVE mg/dL
SPECIFIC GRAVITY, URINE: 1.013 (ref 1.005–1.030)
Urobilinogen, UA: 1 mg/dL (ref 0.0–1.0)

## 2014-11-13 LAB — CBG MONITORING, ED
GLUCOSE-CAPILLARY: 40 mg/dL — AB (ref 70–99)
GLUCOSE-CAPILLARY: 45 mg/dL — AB (ref 70–99)
Glucose-Capillary: 51 mg/dL — ABNORMAL LOW (ref 70–99)

## 2014-11-13 LAB — TROPONIN I
TROPONIN I: 0.07 ng/mL — AB (ref <0.031)
TROPONIN I: 0.06 ng/mL — AB (ref <0.031)
Troponin I: 0.07 ng/mL — ABNORMAL HIGH (ref <0.031)
Troponin I: 0.07 ng/mL — ABNORMAL HIGH (ref <0.031)

## 2014-11-13 LAB — GLUCOSE, CAPILLARY
GLUCOSE-CAPILLARY: 97 mg/dL (ref 70–99)
GLUCOSE-CAPILLARY: 242 mg/dL — AB (ref 70–99)
GLUCOSE-CAPILLARY: 276 mg/dL — AB (ref 70–99)
Glucose-Capillary: 146 mg/dL — ABNORMAL HIGH (ref 70–99)
Glucose-Capillary: 98 mg/dL (ref 70–99)

## 2014-11-13 LAB — LIPASE, BLOOD: Lipase: 17 U/L — ABNORMAL LOW (ref 22–51)

## 2014-11-13 LAB — TSH: TSH: 2.177 u[IU]/mL (ref 0.350–4.500)

## 2014-11-13 MED ORDER — INSULIN ASPART 100 UNIT/ML ~~LOC~~ SOLN
0.0000 [IU] | Freq: Every day | SUBCUTANEOUS | Status: DC
  Administered 2014-11-13: 3 [IU] via SUBCUTANEOUS
  Administered 2014-11-14: 2 [IU] via SUBCUTANEOUS
  Administered 2014-11-15: 3 [IU] via SUBCUTANEOUS
  Administered 2014-11-17: 5 [IU] via SUBCUTANEOUS
  Administered 2014-11-18: 3 [IU] via SUBCUTANEOUS
  Administered 2014-11-19: 2 [IU] via SUBCUTANEOUS
  Administered 2014-11-20: 4 [IU] via SUBCUTANEOUS

## 2014-11-13 MED ORDER — LISINOPRIL 20 MG PO TABS
20.0000 mg | ORAL_TABLET | Freq: Every day | ORAL | Status: DC
  Administered 2014-11-13: 20 mg via ORAL
  Filled 2014-11-13 (×2): qty 1

## 2014-11-13 MED ORDER — HYDROMORPHONE HCL 1 MG/ML IJ SOLN: 1.0000 mg | Freq: Once | INTRAMUSCULAR | Status: DC

## 2014-11-13 MED ORDER — NICOTINE 14 MG/24HR TD PT24
14.0000 mg | MEDICATED_PATCH | Freq: Every day | TRANSDERMAL | Status: DC
  Administered 2014-11-14 – 2014-11-21 (×8): 14 mg via TRANSDERMAL
  Filled 2014-11-13 (×10): qty 1

## 2014-11-13 MED ORDER — ONDANSETRON HCL 4 MG/2ML IJ SOLN
4.0000 mg | Freq: Four times a day (QID) | INTRAMUSCULAR | Status: DC | PRN
  Administered 2014-11-13 – 2014-11-14 (×2): 4 mg via INTRAVENOUS
  Filled 2014-11-13 (×2): qty 2

## 2014-11-13 MED ORDER — DULOXETINE HCL 60 MG PO CPEP
60.0000 mg | ORAL_CAPSULE | Freq: Every day | ORAL | Status: DC
  Administered 2014-11-13 – 2014-11-14 (×2): 60 mg via ORAL
  Filled 2014-11-13 (×2): qty 1

## 2014-11-13 MED ORDER — BENZONATATE 100 MG PO CAPS
100.0000 mg | ORAL_CAPSULE | Freq: Two times a day (BID) | ORAL | Status: DC | PRN
  Administered 2014-11-13 – 2014-11-20 (×4): 100 mg via ORAL
  Filled 2014-11-13 (×4): qty 1

## 2014-11-13 MED ORDER — INSULIN GLARGINE 100 UNIT/ML ~~LOC~~ SOLN
50.0000 [IU] | Freq: Every day | SUBCUTANEOUS | Status: DC
  Filled 2014-11-13: qty 0.5

## 2014-11-13 MED ORDER — INSULIN GLARGINE 100 UNIT/ML ~~LOC~~ SOLN
20.0000 [IU] | Freq: Every day | SUBCUTANEOUS | Status: DC
  Administered 2014-11-13 – 2014-11-15 (×3): 20 [IU] via SUBCUTANEOUS
  Filled 2014-11-13 (×3): qty 0.2

## 2014-11-13 MED ORDER — DEXTROSE 50 % IV SOLN
1.0000 | Freq: Once | INTRAVENOUS | Status: AC
  Administered 2014-11-13: 50 mL via INTRAVENOUS

## 2014-11-13 MED ORDER — TRAMADOL HCL 50 MG PO TABS
50.0000 mg | ORAL_TABLET | Freq: Four times a day (QID) | ORAL | Status: DC | PRN
  Administered 2014-11-18: 50 mg via ORAL
  Filled 2014-11-13: qty 1

## 2014-11-13 MED ORDER — FUROSEMIDE 10 MG/ML IJ SOLN
40.0000 mg | Freq: Two times a day (BID) | INTRAMUSCULAR | Status: DC
  Administered 2014-11-13 (×2): 40 mg via INTRAVENOUS
  Filled 2014-11-13 (×3): qty 4

## 2014-11-13 MED ORDER — PREGABALIN 50 MG PO CAPS
150.0000 mg | ORAL_CAPSULE | Freq: Two times a day (BID) | ORAL | Status: DC
  Administered 2014-11-13 – 2014-11-16 (×9): 150 mg via ORAL
  Filled 2014-11-13 (×2): qty 2
  Filled 2014-11-13: qty 1
  Filled 2014-11-13: qty 2
  Filled 2014-11-13: qty 1
  Filled 2014-11-13: qty 2
  Filled 2014-11-13 (×3): qty 1
  Filled 2014-11-13 (×2): qty 2
  Filled 2014-11-13: qty 1

## 2014-11-13 MED ORDER — ACETAMINOPHEN 325 MG PO TABS
650.0000 mg | ORAL_TABLET | ORAL | Status: DC | PRN
  Administered 2014-11-13: 650 mg via ORAL
  Filled 2014-11-13: qty 2

## 2014-11-13 MED ORDER — GI COCKTAIL ~~LOC~~
30.0000 mL | Freq: Once | ORAL | Status: AC
  Administered 2014-11-13: 30 mL via ORAL
  Filled 2014-11-13: qty 30

## 2014-11-13 MED ORDER — SIMVASTATIN 10 MG PO TABS
10.0000 mg | ORAL_TABLET | Freq: Every day | ORAL | Status: DC
  Administered 2014-11-13 – 2014-11-20 (×8): 10 mg via ORAL
  Filled 2014-11-13 (×10): qty 1

## 2014-11-13 MED ORDER — ENOXAPARIN SODIUM 30 MG/0.3ML ~~LOC~~ SOLN
30.0000 mg | Freq: Every day | SUBCUTANEOUS | Status: DC
  Administered 2014-11-13 – 2014-11-16 (×4): 30 mg via SUBCUTANEOUS
  Filled 2014-11-13 (×6): qty 0.3

## 2014-11-13 MED ORDER — SODIUM CHLORIDE 0.9 % IJ SOLN
3.0000 mL | Freq: Two times a day (BID) | INTRAMUSCULAR | Status: DC
  Administered 2014-11-16 – 2014-11-19 (×4): 3 mL via INTRAVENOUS

## 2014-11-13 MED ORDER — FUROSEMIDE 10 MG/ML IJ SOLN
40.0000 mg | Freq: Once | INTRAMUSCULAR | Status: AC
  Administered 2014-11-13: 40 mg via INTRAVENOUS
  Filled 2014-11-13: qty 4

## 2014-11-13 MED ORDER — TIZANIDINE HCL 4 MG PO TABS
4.0000 mg | ORAL_TABLET | Freq: Two times a day (BID) | ORAL | Status: DC | PRN
  Filled 2014-11-13 (×2): qty 1

## 2014-11-13 MED ORDER — VITAMIN B-12 1000 MCG PO TABS
1000.0000 ug | ORAL_TABLET | Freq: Every day | ORAL | Status: DC
  Administered 2014-11-13 – 2014-11-21 (×9): 1000 ug via ORAL
  Filled 2014-11-13 (×11): qty 1

## 2014-11-13 MED ORDER — CETYLPYRIDINIUM CHLORIDE 0.05 % MT LIQD
7.0000 mL | Freq: Two times a day (BID) | OROMUCOSAL | Status: DC
  Administered 2014-11-13 – 2014-11-21 (×14): 7 mL via OROMUCOSAL

## 2014-11-13 MED ORDER — DEXTROSE 50 % IV SOLN
INTRAVENOUS | Status: AC
  Administered 2014-11-13: 50 mL via INTRAVENOUS
  Filled 2014-11-13: qty 50

## 2014-11-13 MED ORDER — CARVEDILOL 3.125 MG PO TABS
3.1250 mg | ORAL_TABLET | Freq: Two times a day (BID) | ORAL | Status: DC
  Administered 2014-11-13 – 2014-11-14 (×3): 3.125 mg via ORAL
  Filled 2014-11-13 (×3): qty 1

## 2014-11-13 MED ORDER — HYDROCHLOROTHIAZIDE 12.5 MG PO CAPS
12.5000 mg | ORAL_CAPSULE | Freq: Every day | ORAL | Status: DC
  Administered 2014-11-13 – 2014-11-14 (×2): 12.5 mg via ORAL
  Filled 2014-11-13 (×2): qty 1

## 2014-11-13 MED ORDER — NITROGLYCERIN 2 % TD OINT
0.5000 [in_us] | TOPICAL_OINTMENT | Freq: Four times a day (QID) | TRANSDERMAL | Status: DC
Start: 2014-11-13 — End: 2014-11-13
  Filled 2014-11-13: qty 30

## 2014-11-13 MED ORDER — DEXTROSE 50 % IV SOLN: 1.0000 | Freq: Once | INTRAVENOUS | Status: DC

## 2014-11-13 MED ORDER — SODIUM CHLORIDE 0.9 % IV SOLN
250.0000 mL | INTRAVENOUS | Status: DC | PRN
  Administered 2014-11-19: 250 mL via INTRAVENOUS

## 2014-11-13 MED ORDER — LISINOPRIL-HYDROCHLOROTHIAZIDE 20-12.5 MG PO TABS: 1.0000 | ORAL_TABLET | Freq: Every day | ORAL | Status: DC

## 2014-11-13 MED ORDER — SODIUM CHLORIDE 0.9 % IJ SOLN: 3.0000 mL | INTRAMUSCULAR | Status: DC | PRN

## 2014-11-13 MED ORDER — POTASSIUM CHLORIDE CRYS ER 10 MEQ PO TBCR
10.0000 meq | EXTENDED_RELEASE_TABLET | Freq: Two times a day (BID) | ORAL | Status: DC
  Administered 2014-11-13 (×2): 10 meq via ORAL
  Filled 2014-11-13 (×3): qty 1

## 2014-11-13 MED ORDER — ACETAMINOPHEN 500 MG PO TABS
1000.0000 mg | ORAL_TABLET | Freq: Four times a day (QID) | ORAL | Status: DC | PRN
  Administered 2014-11-13 – 2014-11-16 (×2): 1000 mg via ORAL
  Filled 2014-11-13 (×2): qty 2

## 2014-11-13 MED ORDER — INSULIN ASPART 100 UNIT/ML ~~LOC~~ SOLN
0.0000 [IU] | Freq: Three times a day (TID) | SUBCUTANEOUS | Status: DC
  Administered 2014-11-13 – 2014-11-14 (×2): 3 [IU] via SUBCUTANEOUS
  Administered 2014-11-14 (×2): 2 [IU] via SUBCUTANEOUS
  Administered 2014-11-15: 3 [IU] via SUBCUTANEOUS
  Administered 2014-11-15: 5 [IU] via SUBCUTANEOUS
  Administered 2014-11-15: 2 [IU] via SUBCUTANEOUS
  Administered 2014-11-16: 7 [IU] via SUBCUTANEOUS
  Administered 2014-11-16: 9 [IU] via SUBCUTANEOUS
  Administered 2014-11-16 – 2014-11-17 (×3): 7 [IU] via SUBCUTANEOUS
  Administered 2014-11-17: 5 [IU] via SUBCUTANEOUS
  Administered 2014-11-18: 2 [IU] via SUBCUTANEOUS
  Administered 2014-11-18: 5 [IU] via SUBCUTANEOUS
  Administered 2014-11-18: 2 [IU] via SUBCUTANEOUS
  Administered 2014-11-19: 3 [IU] via SUBCUTANEOUS
  Administered 2014-11-19: 1 [IU] via SUBCUTANEOUS
  Administered 2014-11-20: 5 [IU] via SUBCUTANEOUS
  Administered 2014-11-20: 2 [IU] via SUBCUTANEOUS
  Administered 2014-11-20: 1 [IU] via SUBCUTANEOUS
  Administered 2014-11-21 (×2): 3 [IU] via SUBCUTANEOUS

## 2014-11-13 MED ORDER — OLOPATADINE HCL 0.1 % OP SOLN
1.0000 [drp] | Freq: Two times a day (BID) | OPHTHALMIC | Status: DC
  Administered 2014-11-13 – 2014-11-21 (×14): 1 [drp] via OPHTHALMIC
  Filled 2014-11-13 (×2): qty 5

## 2014-11-13 NOTE — H&P (Signed)
[facility] Admission History and Physical       O[place] D[place]y ZOX:[id number] DOB: 14-Jun-1961 DOA: 11/12/2014  Referring physician: EDP PCP: Philis Fendt, MD  Specialists:   Chief Complaint: SOB and Swelling of Both legs  HPI: Christian Wall is a 54 y.o. male with a history of HTN , DM2, Anemia, and Chronic Renal Insufficiency who presents to the ED with complaints of worsening swelling of his lower extremities over a 2 week period.  He reports having increased SOB today.  He denies any fever or chills or chest pain.   He was evaluated in the ED and found to have Mild CHF on chest X-ray, and an elevated BNP of 455.8.   He was administered  40 mg of IV Lasix x 1 in the ED and referred for admission.     Review of Systems:  Constitutional: No Weight Loss, No Weight Gain, Night Sweats, Fevers, Chills, Dizziness, Light Headedness, Fatigue, or Generalized Weakness HEENT: No Headaches, Difficulty Swallowing,Tooth/Dental Problems,Sore Throat,  No Sneezing, Rhinitis, Ear Ache, Nasal Congestion, or Post Nasal Drip,  Cardio-vascular:  No Chest pain, Orthopnea, PND, +Edema in Lower Extremities, Anasarca, Dizziness, Palpitations  Resp: +Dyspnea, +DOE, No Productive Cough, No Non-Productive Cough, No Hemoptysis, No Wheezing.    GI: No Heartburn, Indigestion, Abdominal Pain, Nausea, Vomiting, Diarrhea, Constipation, Hematemesis, Hematochezia, Melena, Change in Bowel Habits,  Loss of Appetite  GU: No Dysuria, No Change in Color of Urine, No Urgency or Urinary Frequency, No Flank pain.  Musculoskeletal: No Joint Pain or Swelling, No Decreased Range of Motion, No Back Pain.  Neurologic: No Syncope, No Seizures, Muscle Weakness, Paresthesia, Vision Disturbance or Loss, No Diplopia, No Vertigo, No Difficulty Walking,  Skin: No Rash or Lesions. Psych: No Change in Mood or Affect, No Depression or Anxiety, No Memory loss, No Confusion, or Hallucinations   Past Medical History  Diagnosis Date    . Diabetes mellitus   . Atrophy of calf muscles   . Depression   . GERD (gastroesophageal reflux disease)   . Peripheral neuropathy   . Hypercholesteremia   . Hx of tracheostomy      Past Surgical History  Procedure Laterality Date  . Pancreas surgery  2001    hx pancreatitis  . Anterior cervical decomp/discectomy fusion  04/13/2012    Procedure: ANTERIOR CERVICAL DECOMPRESSION/DISCECTOMY FUSION 1 LEVEL;  Surgeon: Charlie Pitter, MD;  Location: [place] [facility];  Service: Neurosurgery;  Laterality: N/A;  Cervcial Five-Six Anterior Cervical Decompression and Fusion with Allograft and Plating      Prior to Admission medications   Medication Sig Start Date End Date Taking? Authorizing Provider  Amoxicillin-Pot Clavulanate (AUGMENTIN PO) Take by mouth 2 (two) times daily.    Historical Provider, MD  Blood Glucose Monitoring Suppl (CVS BLOOD GLUCOSE METER) W/DEVICE KIT Check blood sugars 4 times daily, with meals and at bedtime. 11/13/13   Maryann Mikhail, DO  CVS VITAMIN B12 1000 MCG tablet Take 1,000 mcg by mouth daily. 10/15/14  Yes Historical Provider, MD  DULoxetine (CYMBALTA) 60 MG capsule Take 60 mg by mouth daily.   Yes Historical Provider, MD  esomeprazole (NEXIUM) 40 MG capsule Take 40 mg by mouth daily before breakfast.   Yes Historical Provider, MD  fluconazole (DIFLUCAN) 100 MG tablet Take 1 tablet (100 mg total) by mouth daily. Patient not taking: Reported on 11/12/2014 11/29/13   Jerene Bears, MD  furosemide (LASIX) 20 MG tablet Take 20 mg by mouth daily.   Yes Historical Provider, MD  glucose blood (CVS BLOOD GLUCOSE TEST STRIPS) test strip Use as instructed 11/13/13   Maryann Mikhail, DO  insulin aspart (NOVOLOG) 100 UNIT/ML injection Inject 3 Units into the skin 3 (three) times daily with meals. Patient not taking: Reported on 11/12/2014 11/13/13   Maryann Mikhail, DO  insulin aspart (NOVOLOG) 100 UNIT/ML injection Inject 0-15 Units into the skin 3 (three) times daily with meals. CBG  70 - 120: 0 units CBG 121 - 150: 2 units CBG 151 - 200: 3 units CBG 201 - 250: 5 units CBG 251 - 300: 8 units CBG 301 - 350: 11 units CBG 351 - 400: 15 units 11/13/13  Yes Maryann Mikhail, DO  insulin glargine (LANTUS) 100 UNIT/ML injection Inject 0.7 mLs (70 Units total) into the skin daily. Patient taking differently: Inject 30-50 Units into the skin 2 (two) times daily. 50 units in the am and 20 units in the pm 11/13/13  Yes Maryann Mikhail, DO  lipase/protease/amylase (CREON-10/PANCREASE) 12000 UNITS CPEP Take 1-3 capsules by mouth 2 (two) times daily. 3 capsules before meal, and 1 capsule before snack   Yes Historical Provider, MD  lisinopril-hydrochlorothiazide (PRINZIDE,ZESTORETIC) 20-12.5 MG per tablet Take 1 tablet by mouth daily.   Yes Historical Provider, MD  metFORMIN (GLUCOPHAGE) 850 MG tablet Take 850 mg by mouth 2 (two) times daily with a meal.   Yes Historical Provider, MD  Olopatadine HCl (PATADAY) 0.2 % SOLN Apply 1 drop to eye daily as needed. For allergies   Yes Historical Provider, MD  pioglitazone (ACTOS) 15 MG tablet Take 15 mg by mouth daily.   Yes Historical Provider, MD  pregabalin (LYRICA) 150 MG capsule Take 150 mg by mouth 2 (two) times daily.   Yes Historical Provider, MD  simvastatin (ZOCOR) 10 MG tablet Take 10 mg by mouth daily at 6 PM.   Yes Historical Provider, MD  thiamine 100 MG tablet Take 1 tablet (100 mg total) by mouth daily. Patient not taking: Reported on 11/12/2014 11/13/13   Maryann Mikhail, DO  tiZANidine (ZANAFLEX) 4 MG tablet Take 4 mg by mouth 2 (two) times daily as needed for muscle spasms.   Yes Historical Provider, MD  traMADol (ULTRAM) 50 MG tablet Take 50 mg by mouth every 6 (six) hours as needed for moderate pain.    Yes Historical Provider, MD     No Known Allergies  Social History:  reports that he has been smoking Cigarettes.  He has a 30 pack-year smoking history. He has never used smokeless tobacco. He reports that he does not drink  alcohol or use illicit drugs.    Family History  Problem Relation Age of Onset  . Diabetes Mellitus II Neg Hx   . CAD Neg Hx   . Colon cancer Neg Hx        Physical Exam:  GEN:  Obese  54 y.o. African American male examined and in no acute distress; cooperative with exam Filed Vitals:   11/12/14 2124 11/12/14 2310 11/13/14 0052  BP: 140/116 132/81 130/86  Pulse: 114 112 115  Temp: 98.1 F (36.7 C)    TempSrc: Oral    Resp: _0 SpO2: 94% 90% 92%   Blood pressure 130/86, pulse 115, temperature 98.1 F (36.7 C), temperature source Oral, resp. rate 19, SpO2 92 %. PSYCH: He is alert and oriented x4; does not appear anxious does not appear depressed; affect is normal HEENT: Normocephalic and Atraumatic, Mucous membranes pink; PERRLA; EOM intact; Fundi:  Benign;  No  scleral icterus, Nares: Patent, Oropharynx: Clear, Fair Dentition,    Neck:  FROM, No Cervical Lymphadenopathy nor Thyromegaly or Carotid Bruit; No JVD; Breasts:: Not examined CHEST WALL: No tenderness CHEST: Normal respiration, clear to auscultation bilaterally HEART: Regular rate and rhythm; no murmurs rubs or gallops BACK: No kyphosis or scoliosis; No CVA tenderness ABDOMEN: Positive Bowel Sounds, Obese, Soft Non-Tender, No Rebound or Guarding; No Masses, No Organomegaly. Rectal Exam: Not done EXTREMITIES: No Cyanosis, Clubbing, 2+Edema of BLEs; No Ulcerations. Genitalia: not examined PULSES: 2+ and symmetric SKIN: Normal hydration no rash or ulceration CNS:  Alert and Oriented x 4, No Focal Deficits Vascular: pulses palpable throughout    Labs on Admission:  Basic Metabolic Panel:  Recent Labs Lab 11/12/14 2345  NA 138  K 4.6  CL 105  CO2 26  GLUCOSE 48*  BUN 46*  CREATININE 1.71*  CALCIUM 9.0   Liver Function Tests:  Recent Labs Lab 11/12/14 2345  AST 40  ALT 32  ALKPHOS 390*  BILITOT 1.4*  PROT 7.3  ALBUMIN 3.5    Recent Labs Lab 11/12/14 2345  LIPASE 17*   No results for  input(s): AMMONIA in the last 168 hours. CBC:  Recent Labs Lab 11/12/14 2212  WBC 8.7  NEUTROABS 6.5  HGB 10.3*  HCT 33.2*  MCV 68.6*  PLT 432*   Cardiac Enzymes:  Recent Labs Lab 11/12/14 2345  TROPONINI 0.07*    BNP (last 3 results)  Recent Labs  11/12/14 2212  BNP 455.8*    ProBNP (last 3 results) No results for input(s): PROBNP in the last 8760 hours.  CBG:  Recent Labs Lab 11/13/14 0046  GLUCAP 51*    Radiological Exams on Admission: Dg Chest 2 View  11/12/2014   CLINICAL DATA:  Dyspnea, onset today. Lower extremity swelling for 1 week.  EXAM: CHEST  2 VIEW  COMPARISON:  11/09/2013  FINDINGS: There is moderate cardiomegaly and pulmonary hyperinflation. There is mild interstitial fluid or thickening. There is no confluent airspace opacity. There is no pleural effusion.  IMPRESSION: Cardiomegaly and hyperinflation. Mild interstitial thickening in the basilar periphery could represent mild congestive failure.   Electronically Signed   By: Andreas Newport M.D.   On: 11/12/2014 23:20   Dg Knee Complete 4 Views Right  11/12/2014   CLINICAL DATA:  Abdominal swelling. Right knee pain and swelling for 1 week. Also shortness of breath today.  EXAM: RIGHT KNEE - COMPLETE 4+ VIEW  COMPARISON:  None.  FINDINGS: Deformity and heterotopic ossification around the medial right femoral condyle, possibly representing old fracture deformity. Mild degenerative changes in the right knee. Osseous fragments in the anterior joint on the lateral view may represent foreign bodies. No significant effusion. No evidence of acute fracture or dislocation.  IMPRESSION: Deformity and heterotopic calcification at the medial femoral condyle and metaphysis probably representing old fracture deformity. Mild degenerative changes. No acute bony abnormalities.   Electronically Signed   By: Lucienne Capers M.D.   On: 11/12/2014 23:18     EKG: Independently reviewed.    Assessment/Plan:   54 y.o. male  with  Principal Problem:   1.   Acute diastolic CHF (congestive heart failure)/CHF (congestive heart failure)   Telemetry Monitoring   CHF Protocol- diurese with IV Lasix     CArvedilol added, continue Prinizide Rx   2D ECHO ordered for AM   Monitor Electrolytes   Active Problems:     2.   Hypoglycemia- Due to not eating  Monitor Glucose levels follow Hypoglycemia Protocol PRN   Hold parameters set for his Lantus Insulin     3.   Essential hypertension   Continue Prinizide   Monitor BPs     4.   Chronic renal insufficiency   Monitor BUN/Cr Trend      5.   Diabetes mellitus type II, uncontrolled   Lantus ( with Hold parameters for hypoglycemia   Hold Metformin and Glipizide Rx      6.   Peripheral neuropathy   Continue Lyrica Rx     7.   DVT Prophylaxis   Lovenox          Code Status:     FULL CODE       Family Communication:   Wife at Bedside   Disposition Plan:    Inpatient Status        Time spent:  [facility] Pager [phone]   If [place] Please Contact the Day Rounding Team MD for [facility]  If 7PM-7AM, Please Contact Night-Floor Coverage  [email or web address] Password [email or web address] 11/13/2014, 1:22 AM     ADDENDUM:   Patient was seen and examined on 11/13/2014

## 2014-11-13 NOTE — ED Notes (Signed)
Writer provided pt with a [place] sandwich.

## 2014-11-13 NOTE — Care Management Note (Signed)
Case Management Note  Patient Details  Name: Christian Wall MRN: [id number] Date of Birth: 01/23/1961  Subjective/Objective:   54 y/o m admitted w/chf.                 Action/Plan:From home.d/c plan home.   Expected Discharge Date:  11/15/14               Expected Discharge Plan:  Home/Self Care  In-House Referral:     Discharge planning Services  CM Consult  Post Acute Care Choice:    Choice offered to:     DME Arranged:    DME Agency:     HH Arranged:    HH Agency:     Status of Service:  In process, will continue to follow  Medicare Important Message Given:    Date Medicare IM Given:    Medicare IM give by:    Date Additional Medicare IM Given:    Additional Medicare Important Message give by:     If discussed at Long Length of Stay Meetings, dates discussed:    Additional Comments:  Lanier Clam, RN 11/13/2014, 4:18 PM

## 2014-11-13 NOTE — Progress Notes (Signed)
  Echocardiogram 2D Echocardiogram has been performed.  Arvil Chaco 11/13/2014, 1:42 PM

## 2014-11-13 NOTE — Progress Notes (Signed)
Patient seen and evaluated earlier by my associate. Please refer to H and P for details regarding assessment and plan.  General: Pt in nad, alert and awake CV: no cyanosis Pulm: no wheezes, no increased wob  Patient most concerned about pain medication for arthritis per my discussion with nursing. Added tramadol to his current tylenol regimen but he states that this medication bloats him.  Will provide higher dose tylenol for his arthritis.  No chest pain reported to me by nursing or patient.  Will reassess next am.  Penny Pia

## 2014-11-14 ENCOUNTER — Inpatient Hospital Stay (HOSPITAL_COMMUNITY): Payer: Medicare Other

## 2014-11-14 DIAGNOSIS — I5021 Acute systolic (congestive) heart failure: Secondary | ICD-10-CM

## 2014-11-14 DIAGNOSIS — N179 Acute kidney failure, unspecified: Secondary | ICD-10-CM

## 2014-11-14 DIAGNOSIS — R600 Localized edema: Secondary | ICD-10-CM

## 2014-11-14 DIAGNOSIS — R0602 Shortness of breath: Secondary | ICD-10-CM

## 2014-11-14 LAB — HEMOGLOBIN A1C
Hgb A1c MFr Bld: 7.4 % — ABNORMAL HIGH (ref 4.8–5.6)
Mean Plasma Glucose: 166 mg/dL

## 2014-11-14 LAB — GLUCOSE, CAPILLARY
GLUCOSE-CAPILLARY: 202 mg/dL — AB (ref 70–99)
Glucose-Capillary: 40 mg/dL — CL (ref 70–99)
Glucose-Capillary: 179 mg/dL — ABNORMAL HIGH (ref 70–99)
Glucose-Capillary: 185 mg/dL — ABNORMAL HIGH (ref 70–99)
Glucose-Capillary: 221 mg/dL — ABNORMAL HIGH (ref 70–99)

## 2014-11-14 LAB — BASIC METABOLIC PANEL
Anion gap: 10 (ref 5–15)
BUN: 61 mg/dL — ABNORMAL HIGH (ref 6–20)
CALCIUM: 8.8 mg/dL — AB (ref 8.9–10.3)
CO2: 24 mmol/L (ref 22–32)
Chloride: 101 mmol/L (ref 101–111)
Creatinine, Ser: 2.84 mg/dL — ABNORMAL HIGH (ref 0.61–1.24)
GFR calc Af Amer: 28 mL/min — ABNORMAL LOW (ref >60)
GFR calc non Af Amer: 24 mL/min — ABNORMAL LOW (ref >60)
GLUCOSE: 218 mg/dL — AB (ref 70–99)
Potassium: 6.2 mmol/L (ref 3.5–5.1)
Sodium: 135 mmol/L (ref 135–145)

## 2014-11-14 LAB — POTASSIUM: Potassium: 5.6 mmol/L — ABNORMAL HIGH (ref 3.5–5.1)

## 2014-11-14 MED ORDER — ONDANSETRON HCL 4 MG/2ML IJ SOLN
4.0000 mg | Freq: Once | INTRAMUSCULAR | Status: AC
  Administered 2014-11-14: 4 mg via INTRAVENOUS
  Filled 2014-11-14: qty 2

## 2014-11-14 MED ORDER — PROMETHAZINE HCL 25 MG/ML IJ SOLN: 12.5000 mg | Freq: Once | INTRAMUSCULAR | Status: DC

## 2014-11-14 MED ORDER — PANTOPRAZOLE SODIUM 40 MG PO TBEC
40.0000 mg | DELAYED_RELEASE_TABLET | Freq: Every day | ORAL | Status: DC
  Administered 2014-11-14 – 2014-11-21 (×8): 40 mg via ORAL
  Filled 2014-11-14 (×8): qty 1

## 2014-11-14 MED ORDER — SODIUM POLYSTYRENE SULFONATE 15 GM/60ML PO SUSP
15.0000 g | Freq: Once | ORAL | Status: AC
  Administered 2014-11-14: 15 g via ORAL
  Filled 2014-11-14: qty 60

## 2014-11-14 MED ORDER — DULOXETINE HCL 60 MG PO CPEP
60.0000 mg | ORAL_CAPSULE | Freq: Every day | ORAL | Status: DC
  Administered 2014-11-15 – 2014-11-21 (×7): 60 mg via ORAL
  Filled 2014-11-14 (×7): qty 1

## 2014-11-14 NOTE — Consult Note (Signed)
CARDIOLOGY CONSULT NOTE      Patient ID: Christian Wall MRN: [id number] DOB/AGE: Mar 16, 1961 54 y.o.  Admit date: 11/12/2014 Referring PhysicianOrlando Wendee Beavers, MD Primary Nino Parsley, MD Primary Cardiologist new Reason for Consultation cardiomyopathy  HPI: 54 y/o man who had normal LV function by echo in 2015.  She presented with swelling and SHOB, which was getting worse over 2-3 weeks. He denies any chest pain.  Repeat echo was done and showed severely decreased LVEF.  He has had worsening renal function with diuresis  and we are asked to consult.  He has a history of a long hospital many years ago after about a severe pancreatitis. He had a tracheostomy and a long ICU stay. He had significant atrophy in his legs. He now has to walk with braces. This may have been a cause of his diabetes as well.  He has a history of alcohol abuse but has not had alcohol in several years. 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 He relied on alcohol for stress relief.    Review of systems complete and found to be negative unless listed above   Past Medical History  Diagnosis Date  . Diabetes mellitus   . Atrophy of calf muscles   . Depression   . GERD (gastroesophageal reflux disease)   . Peripheral neuropathy   . Hypercholesteremia   . Hx of tracheostomy     Family History  Problem Relation Age of Onset  . Diabetes Mellitus II Sister   . CAD Brother   . Cancer Mother   . Cancer Father     History   Social History  . Marital Status: Married    Spouse Name: N/A  . Number of Children: N/A  . Years of Education: N/A   Occupational History  . Not on file.   Social History Main Topics  . Smoking status: Current Every Day Smoker -- 1.00 packs/day for 30 years    Types: Cigarettes  . Smokeless tobacco: Never Used  . Alcohol Use: No  . Drug Use: No  . Sexual Activity: Yes   Other Topics Concern  . Not on file   Social History  Narrative    Past Surgical History  Procedure Laterality Date  . Pancreas surgery  2001    hx pancreatitis  . Anterior cervical decomp/discectomy fusion  04/13/2012    Procedure: ANTERIOR CERVICAL DECOMPRESSION/DISCECTOMY FUSION 1 LEVEL;  Surgeon: Charlie Pitter, MD;  Location: [facility];  Service: Neurosurgery;  Laterality: N/A;  Cervcial Five-Six Anterior Cervical Decompression and Fusion with Allograft and Plating     Prescriptions prior to admission  Medication Sig Dispense Refill Last Dose  . Amoxicillin-Pot Clavulanate (AUGMENTIN PO) Take by mouth 2 (two) times daily.   Completed Course at Unknown time  . Blood Glucose Monitoring Suppl ([facility] BLOOD GLUCOSE METER) W/DEVICE KIT Check blood sugars 4 times daily, with meals and at bedtime. 1 kit 0 11/29/2013  . [facility] VITAMIN B12 1000 MCG tablet Take 1,000 mcg by mouth daily.  5 11/12/2014 at Unknown time  . DULoxetine (CYMBALTA) 60 MG capsule Take 60 mg by mouth daily.   11/12/2014 at Unknown time  . esomeprazole (NEXIUM) 40 MG capsule Take 40 mg by mouth daily before breakfast.   11/12/2014 at Unknown time  . fluconazole (DIFLUCAN) 100 MG tablet Take 1 tablet (100 mg total) by mouth daily. (Patient not taking: Reported on 11/12/2014) 21 tablet 0 Completed Course  at Unknown time  . furosemide (LASIX) 20 MG tablet Take 20 mg by mouth daily.   11/12/2014 at Unknown time  . glucose blood ([facility] BLOOD GLUCOSE TEST STRIPS) test strip Use as instructed 100 each 12 11/29/2013  . insulin aspart (NOVOLOG) 100 UNIT/ML injection Inject 3 Units into the skin 3 (three) times daily with meals. (Patient not taking: Reported on 11/12/2014) 10 mL 11 Not Taking at Unknown time  . insulin aspart (NOVOLOG) 100 UNIT/ML injection Inject 0-15 Units into the skin 3 (three) times daily with meals. CBG 70 - 120: 0 units CBG 121 - 150: 2 units CBG 151 - 200: 3 units CBG 201 - 250: 5 units CBG 251 - 300: 8 units CBG 301 - 350: 11 units CBG 351 - 400: 15 units 10 mL 11 Past Week at  Unknown time  . insulin glargine (LANTUS) 100 UNIT/ML injection Inject 0.7 mLs (70 Units total) into the skin daily. (Patient taking differently: Inject 30-50 Units into the skin 2 (two) times daily. 50 units in the am and 20 units in the pm) 10 mL 11 11/12/2014 at Unknown time  . lipase/protease/amylase (CREON-10/PANCREASE) 12000 UNITS CPEP Take 1-3 capsules by mouth 2 (two) times daily. 3 capsules before meal, and 1 capsule before snack   11/12/2014 at Unknown time  . lisinopril-hydrochlorothiazide (PRINZIDE,ZESTORETIC) 20-12.5 MG per tablet Take 1 tablet by mouth daily.   11/12/2014 at Unknown time  . metFORMIN (GLUCOPHAGE) 850 MG tablet Take 850 mg by mouth 2 (two) times daily with a meal.   11/12/2014 at Unknown time  . Olopatadine HCl (PATADAY) 0.2 % SOLN Apply 1 drop to eye daily as needed. For allergies   Past Month at Unknown time  . pioglitazone (ACTOS) 15 MG tablet Take 15 mg by mouth daily.   11/12/2014 at Unknown time  . pregabalin (LYRICA) 150 MG capsule Take 150 mg by mouth 2 (two) times daily.   11/12/2014 at Unknown time  . simvastatin (ZOCOR) 10 MG tablet Take 10 mg by mouth daily at 6 PM.   11/11/2014 at Unknown time  . thiamine 100 MG tablet Take 1 tablet (100 mg total) by mouth daily. (Patient not taking: Reported on 11/12/2014) 30 tablet 0 Not Taking at Unknown time  . tiZANidine (ZANAFLEX) 4 MG tablet Take 4 mg by mouth 2 (two) times daily as needed for muscle spasms.   Past Week at Unknown time  . traMADol (ULTRAM) 50 MG tablet Take 50 mg by mouth every 6 (six) hours as needed for moderate pain.    Past Week at Unknown time    Physical Exam: Vitals:   Filed Vitals:   11/14/14 0848 11/14/14 1125 11/14/14 1130 11/14/14 1441  BP: 1_0 89/63  Pulse:    92  Temp:      TempSrc:      Resp:    20  Height:      Weight:      SpO2:    100%   I&O's:   Intake/Output Summary (Last 24 hours) at 11/14/14 1938 Last data filed at 11/14/14 1756  Gross per 24 hour  Intake    830 ml    Output    104 ml  Net    726 ml   Physical exam:  [place]/AT EOMI No JVD, No carotid bruit RRR S1S2  Mild basilar crackles Soft. NT, nondistended Bilateral lower extremity pitting edema. No focal motor or sensory deficits Normal affect  Labs:   Lab Results  Component Value Date   WBC 8.7 11/12/2014   HGB 10.3* 11/12/2014   HCT 33.2* 11/12/2014   MCV 68.6* 11/12/2014   PLT 432* 11/12/2014    Recent Labs Lab 11/12/14 2345 11/14/14 0515 11/14/14 1823  NA 138 135  --   K 4.6 6.2* 5.6*  CL 105 101  --   CO2 26 24  --   BUN 46* 61*  --   CREATININE 1.71* 2.84*  --   CALCIUM 9.0 8.8*  --   PROT 7.3  --   --   BILITOT 1.4*  --   --   ALKPHOS 390*  --   --   ALT 32  --   --   AST 40  --   --   GLUCOSE 48* 218*  --    Lab Results  Component Value Date   TROPONINI 0.07* 11/13/2014   No results found for: CHOL No results found for: HDL No results found for: LDLCALC No results found for: TRIG No results found for: CHOLHDL No results found for: LDLDIRECT    Radiology: Cardiomegaly, possible edema EKG:  Sinus tachycardia, septal Q waves, nonspecific ST segment changes ASSESSMENT AND PLAN:  Principal Problem:   Acute diastolic CHF (congestive heart failure) Active Problems:   CHF (congestive heart failure)   Essential hypertension   Hypoglycemia   Chronic renal insufficiency   Diabetes mellitus type II, uncontrolled   Peripheral neuropathy   Congestive heart disease  Acute systolic heart failure. Echocardiogram showed normal LV function back in 2015. He feels better after diuresis but he has had significant worsening of his renal function. This likely represents cardiorenal syndrome.  He will need advanced heart failure management. I have spoken to Dr. Haroldine Laws of the heart failure service. The patient should be transferred tomorrow to 2 heart step down at [facility] and the heart failure service will consult on this patient.  Due to his renal dysfunction, he is  not a candidate for ACE inhibitor nor any type of ischemic evaluation. Beta blocker was started. Would not uptitrate at this point given his likely low output state.  I explained all of this to the patient. He understands and is willing to be transferred tomorrow.   Signed:   Mina Marble, MD, [facility] 11/14/2014, 7:38 PM

## 2014-11-14 NOTE — Progress Notes (Signed)
[facility] PROGRESS NOTE  Christian Wall ONG:[id number] DOB: 1960/10/16 DOA: 11/12/2014 PCP: Dorrene German, MD  Assessment/Plan: Principal Problem:   Acute diastolic and systolic CHF (congestive heart failure)/  CHF (congestive heart failure) - Echocardiogram showing decreased systolic function. Given findings we'll consult cardiology for further evaluation recommendations. - We'll discontinue beta blocker in lieu of acute decompensation - Once blood pressures improved we'll plan on administering Lasix  Active Problems:  Hyperkalemia - Repeat potassium levels today - Hold lisinopril - provide kayexalate     Essential hypertension - Patient has had soft blood pressures as such will hold beta blocker - Hold lisinopril secondary to elevated potassium levels    Hypoglycemia - Resolved with decrease in Lantus dose    Acute on Chronic renal insufficiency - Obtain renal ultrasound - Could be 2ary to decreased EF which may be affecting values. - Discontinue Lisinopril - If continued elevation in serum creatinine will obtain nephrology consult    Diabetes mellitus type II, uncontrolled - diabetic diet - continue Lantus and SSI  Elevated troponin - Most likely demand ischemia given acute decompensation of his CHF - Consulted cardiology for further evaluation. - No chest pain reported and echocardiogram obtained   Code Status: full Family Communication: No family at bedside Disposition Plan: Pending improvement in condition and further work up   Consultants:  Cardiology  Procedures:  Echocardiogram  Renal u/s pending  Antibiotics:  None  HPI/Subjective: Pt has no new complaints. States he has never had a problem with his heart. Questions answered to his satisfaction  Objective: Filed Vitals:   11/14/14 1441  BP: 89/63  Pulse: 92  Temp:   Resp: 20    Intake/Output Summary (Last 24 hours) at 11/14/14 1645 Last data filed at 11/14/14 0700  Gross  per 24 hour  Intake    480 ml  Output      4 ml  Net    476 ml   Filed Weights   11/13/14 0158 11/13/14 0607 11/14/14 0519  Weight: 110.2 kg (242 lb 15.2 oz) 111.1 kg (244 lb 14.9 oz) 107.457 kg (236 lb 14.4 oz)    Exam:   General:  Patient in no acute distress, alert and awake  Cardiovascular: S1 and S2 within normal limits, no rubs  Respiratory: Clear to auscultation bilaterally, no wheezes  Abdomen: Obese, no guarding, nontender  Musculoskeletal: No cyanosis with normal tone   Data Reviewed: Basic Metabolic Panel:  Recent Labs Lab 11/12/14 2345 11/14/14 0515  NA 138 135  K 4.6 6.2*  CL 105 101  CO2 26 24  GLUCOSE 48* 218*  BUN 46* 61*  CREATININE 1.71* 2.84*  CALCIUM 9.0 8.8*   Liver Function Tests:  Recent Labs Lab 11/12/14 2345  AST 40  ALT 32  ALKPHOS 390*  BILITOT 1.4*  PROT 7.3  ALBUMIN 3.5    Recent Labs Lab 11/12/14 2345  LIPASE 17*   No results for input(s): AMMONIA in the last 168 hours. CBC:  Recent Labs Lab 11/12/14 2212  WBC 8.7  NEUTROABS 6.5  HGB 10.3*  HCT 33.2*  MCV 68.6*  PLT 432*   Cardiac Enzymes:  Recent Labs Lab 11/12/14 2345 11/13/14 0230 11/13/14 0738 11/13/14 1431  TROPONINI 0.07* 0.07* 0.06* 0.07*   BNP (last 3 results)  Recent Labs  11/12/14 2212  BNP 455.8*    ProBNP (last 3 results) No results for input(s): PROBNP in the last 8760 hours.  CBG:  Recent Labs Lab 11/13/14 1210 11/13/14 1800 11/13/14  2213 11/14/14 0756 11/14/14 1156  GLUCAP 98 242* 276* 179* 185*    No results found for this or any previous visit (from the past 240 hour(s)).   Studies: Dg Chest 2 View  11/12/2014   CLINICAL DATA:  Dyspnea, onset today. Lower extremity swelling for 1 week.  EXAM: CHEST  2 VIEW  COMPARISON:  11/09/2013  FINDINGS: There is moderate cardiomegaly and pulmonary hyperinflation. There is mild interstitial fluid or thickening. There is no confluent airspace opacity. There is no pleural  effusion.  IMPRESSION: Cardiomegaly and hyperinflation. Mild interstitial thickening in the basilar periphery could represent mild congestive failure.   Electronically Signed   By: Ellery Plunk M.D.   On: 11/12/2014 23:20   Dg Knee Complete 4 Views Right  11/12/2014   CLINICAL DATA:  Abdominal swelling. Right knee pain and swelling for 1 week. Also shortness of breath today.  EXAM: RIGHT KNEE - COMPLETE 4+ VIEW  COMPARISON:  None.  FINDINGS: Deformity and heterotopic ossification around the medial right femoral condyle, possibly representing old fracture deformity. Mild degenerative changes in the right knee. Osseous fragments in the anterior joint on the lateral view may represent foreign bodies. No significant effusion. No evidence of acute fracture or dislocation.  IMPRESSION: Deformity and heterotopic calcification at the medial femoral condyle and metaphysis probably representing old fracture deformity. Mild degenerative changes. No acute bony abnormalities.   Electronically Signed   By: Burman Nieves M.D.   On: 11/12/2014 23:18    Scheduled Meds: . antiseptic oral rinse  7 mL Mouth Rinse BID  . [START ON 11/15/2014] DULoxetine  60 mg Oral Daily  . enoxaparin (LOVENOX) injection  30 mg Subcutaneous Daily  . furosemide  40 mg Intravenous Q12H  . insulin aspart  0-5 Units Subcutaneous QHS  . insulin aspart  0-9 Units Subcutaneous TID WC  . insulin glargine  20 Units Subcutaneous QHS  . nicotine  14 mg Transdermal Daily  . olopatadine  1 drop Both Eyes BID  . pantoprazole  40 mg Oral Daily  . pregabalin  150 mg Oral BID  . simvastatin  10 mg Oral q1800  . sodium chloride  3 mL Intravenous Q12H  . cyanocobalamin  1,000 mcg Oral Daily   Continuous Infusions:     Time spent: > 35 minutes    Penny Pia  [facility] Pager [phone] If 7PM-7AM, please contact night-coverage at [email or web address], password [facility] 11/14/2014, 4:45 PM  LOS: 1 day

## 2014-11-14 NOTE — Progress Notes (Signed)
Inpatient Diabetes Program Recommendations  AACE/ADA: New Consensus Statement on Inpatient Glycemic Control (2013)  Target Ranges:  Prepandial:   less than 140 mg/dL      Peak postprandial:   less than 180 mg/dL (1-2 hours)      Critically ill patients:  140 - 180 mg/dL   Reason for Visit: Hyperglycemia  Results for CARRON, NEE (MRN [id number]) as of 11/14/2014 11:52  Ref. Range 11/14/2014 07:56  Glucose-Capillary Latest Ref Range: 70-99 mg/dL 835 (H)  Results for AVIEN, BENARD (MRN [id number]) as of 11/14/2014 11:52  Ref. Range 11/14/2014 05:15  Glucose Latest Ref Range: 70-99 mg/dL 720 (H)    Inpatient Diabetes Program Recommendations Insulin - Basal: Increase Lantus to 30 units QD Correction (SSI): Increase Novolog to moderate tidwc and hs Insulin - Meal Coverage: May need meal coverage insulin if post-prandial blood sugars > 200 mg/dL [id number]: 2.1% - H/H low  Note: Pt likely hypoglycemic on admission d/t large basal insulin dose (50 units in am and 20 units QHS)  Thank you. Ailene Ards, RD, LDN, CDE Inpatient Diabetes Coordinator [phone]

## 2014-11-14 NOTE — Progress Notes (Signed)
CRITICAL VALUE ALERT  Critical value received:  Potassium 6.2  Date of notification:  11/14/2014  Time of notification:  0635  Critical value read back:Yes.    Nurse who received alert:  Vevelyn Royals, RN  MD notified (1st page):  Merdis Delay, NP  Time of first page:  [phone]  MD notified (2nd page):  Time of second page:  Responding MD:    Time MD responded:

## 2014-11-14 NOTE — Progress Notes (Signed)
Patients BP 85/56 manual this morning. NP on call made aware. Will continue to monitor.

## 2014-11-14 NOTE — Clinical Documentation Improvement (Signed)
  Per H&P: "He was evaluated in the ED and found to have mild CHF on chest X-ray and an elevated BNP of 455.8" and "Acute diastolic CHF", "CHF Protocol- diurese with IV Lasix"  Per echo report: "LV EF: 20% - 25%"  Please clarify the type of this patient's acute CHF, for example:  Systolic Diastolic  Combined systolic and diastolic Unable to suspect or determine Other, please specify:  Thank you,   Alesia Richards, RN CDS [facility] Health Information Management [email or web address] [phone]

## 2014-11-14 NOTE — Progress Notes (Signed)
After renal u/s patient called for nurse. Patient was vomitinglarge amount, appeared bloody in color. Zofran PRN given. NP on call paged, additional dose of Zofran ordered and given. Writer read cardiology note, paged NP on call regarding mention of patient going to 2 Heart SDU tomorrow in cardiology note. Awaiting return call from NP. Will continue to monitor closely.

## 2014-11-14 NOTE — Progress Notes (Signed)
MD paged made aware of low BP  (see flowsheet).

## 2014-11-14 NOTE — Progress Notes (Signed)
Merdis Delay, NP returned call and said to call cardiology regarding transfer of patient. Paged cardiologist on call and spoke with the on call. At this time he did not see a reason to transfer him to 2 Heart SDU tonight in regards to CHF.  Will continue to monitor.

## 2014-11-15 ENCOUNTER — Encounter (HOSPITAL_COMMUNITY)
Admission: EM | Disposition: A | Discharge status: Home Health | Payer: Medicare Other | Source: Home / Self Care | Attending: Internal Medicine

## 2014-11-15 ENCOUNTER — Ambulatory Visit (HOSPITAL_COMMUNITY): Admit: 2014-11-15 | Payer: Self-pay | Admitting: Internal Medicine

## 2014-11-15 ENCOUNTER — Encounter (HOSPITAL_COMMUNITY): Payer: Self-pay | Admitting: Physician Assistant

## 2014-11-15 DIAGNOSIS — R570 Cardiogenic shock: Secondary | ICD-10-CM

## 2014-11-15 HISTORY — PX: CARDIAC CATHETERIZATION: SHX172

## 2014-11-15 LAB — GLUCOSE, CAPILLARY
GLUCOSE-CAPILLARY: 294 mg/dL — AB (ref 70–99)
GLUCOSE-CAPILLARY: 237 mg/dL — AB (ref 70–99)
GLUCOSE-CAPILLARY: 279 mg/dL — AB (ref 70–99)
Glucose-Capillary: 183 mg/dL — ABNORMAL HIGH (ref 70–99)

## 2014-11-15 LAB — BASIC METABOLIC PANEL
Anion gap: 14 (ref 5–15)
BUN: 72 mg/dL — ABNORMAL HIGH (ref 6–20)
CHLORIDE: 98 mmol/L — AB (ref 101–111)
CO2: 24 mmol/L (ref 22–32)
Calcium: 8.8 mg/dL — ABNORMAL LOW (ref 8.9–10.3)
Creatinine, Ser: 3.22 mg/dL — ABNORMAL HIGH (ref 0.61–1.24)
GFR calc Af Amer: 24 mL/min — ABNORMAL LOW (ref >60)
GFR calc non Af Amer: 20 mL/min — ABNORMAL LOW (ref >60)
GLUCOSE: 228 mg/dL — AB (ref 70–99)
Potassium: 5.5 mmol/L — ABNORMAL HIGH (ref 3.5–5.1)
Sodium: 136 mmol/L (ref 135–145)

## 2014-11-15 LAB — POCT I-STAT 3, VENOUS BLOOD GAS (G3P V)
Acid-base deficit: 2 mmol/L (ref 0.0–2.0)
Acid-base deficit: 3 mmol/L — ABNORMAL HIGH (ref 0.0–2.0)
Bicarbonate: 24.9 mEq/L — ABNORMAL HIGH (ref 20.0–24.0)
Bicarbonate: 26.2 mEq/L — ABNORMAL HIGH (ref 20.0–24.0)
O2 SAT: 45 %
O2 Saturation: 45 %
PCO2 VEN: 57.1 mmHg — AB (ref 45.0–50.0)
PH VEN: 7.263 (ref 7.250–7.300)
PO2 VEN: 29 mmHg — AB (ref 30.0–45.0)
TCO2: 28 mmol/L (ref 0–100)
TCO2: 27 mmol/L (ref 0–100)
pCO2, Ven: 55 mmHg — ABNORMAL HIGH (ref 45.0–50.0)
pH, Ven: 7.27 (ref 7.250–7.300)
pO2, Ven: 29 mmHg — CL (ref 30.0–45.0)

## 2014-11-15 LAB — CREATININE, SERUM
Creatinine, Ser: 2.88 mg/dL — ABNORMAL HIGH (ref 0.61–1.24)
GFR calc Af Amer: 27 mL/min — ABNORMAL LOW (ref >60)
GFR calc non Af Amer: 23 mL/min — ABNORMAL LOW (ref >60)

## 2014-11-15 LAB — CARBOXYHEMOGLOBIN
CARBOXYHEMOGLOBIN: 2.3 % — AB (ref 0.5–1.5)
METHEMOGLOBIN: 1 % (ref 0.0–1.5)
O2 SAT: 63.6 %
TOTAL HEMOGLOBIN: 8.9 g/dL — AB (ref 13.5–18.0)

## 2014-11-15 SURGERY — RIGHT HEART CATH

## 2014-11-15 MED ORDER — HEPARIN (PORCINE) IN NACL 2-0.9 UNIT/ML-% IJ SOLN
INTRAMUSCULAR | Status: AC
  Filled 2014-11-15: qty 1000

## 2014-11-15 MED ORDER — PANCRELIPASE (LIP-PROT-AMYL) 12000-38000 UNITS PO CPEP
3.0000 | ORAL_CAPSULE | Freq: Two times a day (BID) | ORAL | Status: DC
  Administered 2014-11-15 – 2014-11-21 (×12): 36000 [IU] via ORAL
  Filled 2014-11-15 (×15): qty 3

## 2014-11-15 MED ORDER — SODIUM CHLORIDE 0.9 % IJ SOLN: 3.0000 mL | INTRAMUSCULAR | Status: DC | PRN

## 2014-11-15 MED ORDER — MILRINONE IN DEXTROSE 20 MG/100ML IV SOLN
0.1250 ug/kg/min | INTRAVENOUS | Status: DC
  Administered 2014-11-15 – 2014-11-19 (×8): 0.25 ug/kg/min via INTRAVENOUS
  Administered 2014-11-19: 0.125 ug/kg/min via INTRAVENOUS
  Filled 2014-11-15 (×9): qty 100

## 2014-11-15 MED ORDER — SODIUM POLYSTYRENE SULFONATE 15 GM/60ML PO SUSP
15.0000 g | Freq: Once | ORAL | Status: DC
  Filled 2014-11-15: qty 60

## 2014-11-15 MED ORDER — LIDOCAINE HCL (PF) 1 % IJ SOLN
INTRAMUSCULAR | Status: AC
  Filled 2014-11-15: qty 30

## 2014-11-15 MED ORDER — SODIUM CHLORIDE 0.9 % IV SOLN: 250.0000 mL | INTRAVENOUS | Status: DC | PRN

## 2014-11-15 MED ORDER — FENTANYL CITRATE (PF) 100 MCG/2ML IJ SOLN
INTRAMUSCULAR | Status: DC | PRN
  Administered 2014-11-15: 50 ug via INTRAVENOUS

## 2014-11-15 MED ORDER — SODIUM CHLORIDE 0.9 % IJ SOLN
3.0000 mL | Freq: Two times a day (BID) | INTRAMUSCULAR | Status: DC
  Administered 2014-11-15 – 2014-11-19 (×8): 3 mL via INTRAVENOUS

## 2014-11-15 MED ORDER — SODIUM CHLORIDE 0.9 % IJ SOLN: 3.0000 mL | Freq: Two times a day (BID) | INTRAMUSCULAR | Status: DC

## 2014-11-15 MED ORDER — SODIUM CHLORIDE 0.9 % IV SOLN: INTRAVENOUS | Status: DC

## 2014-11-15 MED ORDER — SODIUM CHLORIDE 0.9 % IV SOLN
250.0000 mL | INTRAVENOUS | Status: DC | PRN
  Administered 2014-11-15: 250 mL via INTRAVENOUS

## 2014-11-15 MED ORDER — ONDANSETRON HCL 4 MG/2ML IJ SOLN: 4.0000 mg | Freq: Four times a day (QID) | INTRAMUSCULAR | Status: DC | PRN

## 2014-11-15 MED ORDER — ASPIRIN 81 MG PO CHEW: 81.0000 mg | CHEWABLE_TABLET | ORAL | Status: DC

## 2014-11-15 MED ORDER — FUROSEMIDE 10 MG/ML IJ SOLN
80.0000 mg | Freq: Two times a day (BID) | INTRAMUSCULAR | Status: DC
  Administered 2014-11-15 – 2014-11-18 (×6): 80 mg via INTRAVENOUS
  Filled 2014-11-15 (×8): qty 8

## 2014-11-15 MED ORDER — SODIUM CHLORIDE 0.9 % IJ SOLN
3.0000 mL | Freq: Two times a day (BID) | INTRAMUSCULAR | Status: DC
Start: 2014-11-15 — End: 2014-11-19
  Administered 2014-11-16 – 2014-11-18 (×3): 3 mL via INTRAVENOUS

## 2014-11-15 MED ORDER — FUROSEMIDE 10 MG/ML IJ SOLN
INTRAMUSCULAR | Status: AC
  Filled 2014-11-15: qty 8

## 2014-11-15 MED ORDER — FENTANYL CITRATE (PF) 100 MCG/2ML IJ SOLN
INTRAMUSCULAR | Status: AC
  Filled 2014-11-15: qty 2

## 2014-11-15 MED ORDER — FUROSEMIDE 10 MG/ML IJ SOLN
80.0000 mg | Freq: Once | INTRAMUSCULAR | Status: AC
  Administered 2014-11-15: 80 mg via INTRAVENOUS

## 2014-11-15 SURGICAL SUPPLY — 21 items
CATH BALLN WEDGE 5F 110CM (CATHETERS) ×1 IMPLANT
CATH INFINITI 5FR ANG PIGTAIL (CATHETERS) ×1 IMPLANT
CATH INFINITI 5FR MULTPACK ANG (CATHETERS) IMPLANT
CATH INFINITI JR4 5F (CATHETERS) ×1 IMPLANT
CATH SWAN GANZ 7F STRAIGHT (CATHETERS) IMPLANT
CATH SWAN VIP NON-HEP 7.5F (CATHETERS) ×2 IMPLANT
DEVICE RAD COMP TR BAND LRG (VASCULAR PRODUCTS) ×1 IMPLANT
GLIDESHEATH SLEND SS 6F .021 (SHEATH) ×1 IMPLANT
KIT HEART LEFT (KITS) ×1 IMPLANT
KIT HEART RIGHT NAMIC (KITS) ×1 IMPLANT
PACK CARDIAC CATHETERIZATION (CUSTOM PROCEDURE TRAY) ×3 IMPLANT
SHEATH FAST CATH BRACH 5F 5CM (SHEATH) ×1 IMPLANT
SHEATH PINNACLE 5F 10CM (SHEATH) IMPLANT
SHEATH PINNACLE 7F 10CM (SHEATH) ×2 IMPLANT
SHEATH PINNACLE 8F 10CM (SHEATH) ×2 IMPLANT
SLEEVE REPOSITIONING LENGTH 30 (MISCELLANEOUS) ×2 IMPLANT
SYR MEDRAD MARK V 150ML (SYRINGE) ×1 IMPLANT
TRANSDUCER W/STOPCOCK (MISCELLANEOUS) ×4 IMPLANT
TUBING CIL FLEX 10 FLL-RA (TUBING) ×1 IMPLANT
WIRE EMERALD 3MM-J .035X150CM (WIRE) IMPLANT
WIRE SAFE-T 1.5MM-J .035X260CM (WIRE) ×1 IMPLANT

## 2014-11-15 NOTE — H&P (View-Only) (Signed)
CARDIOLOGY CONSULT NOTE      Patient ID: Christian Wall MRN: [id number] DOB/AGE: Mar 16, 1961 54 y.o.  Admit date: 11/12/2014 Referring PhysicianOrlando Wendee Beavers, MD Primary Nino Parsley, MD Primary Cardiologist new Reason for Consultation cardiomyopathy  HPI: 54 y/o man who had normal LV function by echo in 2015.  She presented with swelling and SHOB, which was getting worse over 2-3 weeks. He denies any chest pain.  Repeat echo was done and showed severely decreased LVEF.  He has had worsening renal function with diuresis  and we are asked to consult.  He has a history of a long hospital many years ago after about a severe pancreatitis. He had a tracheostomy and a long ICU stay. He had significant atrophy in his legs. He now has to walk with braces. This may have been a cause of his diabetes as well.  He has a history of alcohol abuse but has not had alcohol in several years. 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 He relied on alcohol for stress relief.    Review of systems complete and found to be negative unless listed above   Past Medical History  Diagnosis Date  . Diabetes mellitus   . Atrophy of calf muscles   . Depression   . GERD (gastroesophageal reflux disease)   . Peripheral neuropathy   . Hypercholesteremia   . Hx of tracheostomy     Family History  Problem Relation Age of Onset  . Diabetes Mellitus II Sister   . CAD Brother   . Cancer Mother   . Cancer Father     History   Social History  . Marital Status: Married    Spouse Name: N/A  . Number of Children: N/A  . Years of Education: N/A   Occupational History  . Not on file.   Social History Main Topics  . Smoking status: Current Every Day Smoker -- 1.00 packs/day for 30 years    Types: Cigarettes  . Smokeless tobacco: Never Used  . Alcohol Use: No  . Drug Use: No  . Sexual Activity: Yes   Other Topics Concern  . Not on file   Social History  Narrative    Past Surgical History  Procedure Laterality Date  . Pancreas surgery  2001    hx pancreatitis  . Anterior cervical decomp/discectomy fusion  04/13/2012    Procedure: ANTERIOR CERVICAL DECOMPRESSION/DISCECTOMY FUSION 1 LEVEL;  Surgeon: Charlie Pitter, MD;  Location: [facility];  Service: Neurosurgery;  Laterality: N/A;  Cervcial Five-Six Anterior Cervical Decompression and Fusion with Allograft and Plating     Prescriptions prior to admission  Medication Sig Dispense Refill Last Dose  . Amoxicillin-Pot Clavulanate (AUGMENTIN PO) Take by mouth 2 (two) times daily.   Completed Course at Unknown time  . Blood Glucose Monitoring Suppl ([employer] BLOOD GLUCOSE METER) W/DEVICE KIT Check blood sugars 4 times daily, with meals and at bedtime. 1 kit 0 11/29/2013  . [employer]S VITAMIN B12 1000 MCG tablet Take 1,000 mcg by mouth daily.  5 11/12/2014 at Unknown time  . DULoxetine (CYMBALTA) 60 MG capsule Take 60 mg by mouth daily.   11/12/2014 at Unknown time  . esomeprazole (NEXIUM) 40 MG capsule Take 40 mg by mouth daily before breakfast.   11/12/2014 at Unknown time  . fluconazole (DIFLUCAN) 100 MG tablet Take 1 tablet (100 mg total) by mouth daily. (Patient not taking: Reported on 11/12/2014) 21 tablet 0 Completed Course  at Unknown time  . furosemide (LASIX) 20 MG tablet Take 20 mg by mouth daily.   11/12/2014 at Unknown time  . glucose blood ([employer]S BLOOD GLUCOSE TEST STRIPS) test strip Use as instructed 100 each 12 11/29/2013  . insulin aspart (NOVOLOG) 100 UNIT/ML injection Inject 3 Units into the skin 3 (three) times daily with meals. (Patient not taking: Reported on 11/12/2014) 10 mL 11 Not Taking at Unknown time  . insulin aspart (NOVOLOG) 100 UNIT/ML injection Inject 0-15 Units into the skin 3 (three) times daily with meals. CBG 70 - 120: 0 units CBG 121 - 150: 2 units CBG 151 - 200: 3 units CBG 201 - 250: 5 units CBG 251 - 300: 8 units CBG 301 - 350: 11 units CBG 351 - 400: 15 units 10 mL 11 Past Week at  Unknown time  . insulin glargine (LANTUS) 100 UNIT/ML injection Inject 0.7 mLs (70 Units total) into the skin daily. (Patient taking differently: Inject 30-50 Units into the skin 2 (two) times daily. 50 units in the am and 20 units in the pm) 10 mL 11 11/12/2014 at Unknown time  . lipase/protease/amylase (CREON-10/PANCREASE) 12000 UNITS CPEP Take 1-3 capsules by mouth 2 (two) times daily. 3 capsules before meal, and 1 capsule before snack   11/12/2014 at Unknown time  . lisinopril-hydrochlorothiazide (PRINZIDE,ZESTORETIC) 20-12.5 MG per tablet Take 1 tablet by mouth daily.   11/12/2014 at Unknown time  . metFORMIN (GLUCOPHAGE) 850 MG tablet Take 850 mg by mouth 2 (two) times daily with a meal.   11/12/2014 at Unknown time  . Olopatadine HCl (PATADAY) 0.2 % SOLN Apply 1 drop to eye daily as needed. For allergies   Past Month at Unknown time  . pioglitazone (ACTOS) 15 MG tablet Take 15 mg by mouth daily.   11/12/2014 at Unknown time  . pregabalin (LYRICA) 150 MG capsule Take 150 mg by mouth 2 (two) times daily.   11/12/2014 at Unknown time  . simvastatin (ZOCOR) 10 MG tablet Take 10 mg by mouth daily at 6 PM.   11/11/2014 at Unknown time  . thiamine 100 MG tablet Take 1 tablet (100 mg total) by mouth daily. (Patient not taking: Reported on 11/12/2014) 30 tablet 0 Not Taking at Unknown time  . tiZANidine (ZANAFLEX) 4 MG tablet Take 4 mg by mouth 2 (two) times daily as needed for muscle spasms.   Past Week at Unknown time  . traMADol (ULTRAM) 50 MG tablet Take 50 mg by mouth every 6 (six) hours as needed for moderate pain.    Past Week at Unknown time    Physical Exam: Vitals:   Filed Vitals:   11/14/14 0848 11/14/14 1125 11/14/14 1130 11/14/14 1441  BP: 1_0 89/63  Pulse:    92  Temp:      TempSrc:      Resp:    20  Height:      Weight:      SpO2:    100%   I&O's:   Intake/Output Summary (Last 24 hours) at 11/14/14 1938 Last data filed at 11/14/14 1756  Gross per 24 hour  Intake    830 ml    Output    104 ml  Net    726 ml   Physical exam:  [place]/AT EOMI No JVD, No carotid bruit RRR S1S2  Mild basilar crackles Soft. NT, nondistended Bilateral lower extremity pitting edema. No focal motor or sensory deficits Normal affect  Labs:   Lab Results  Component Value Date   WBC 8.7 11/12/2014   HGB 10.3* 11/12/2014   HCT 33.2* 11/12/2014   MCV 68.6* 11/12/2014   PLT 432* 11/12/2014    Recent Labs Lab 11/12/14 2345 11/14/14 0515 11/14/14 1823  NA 138 135  --   K 4.6 6.2* 5.6*  CL 105 101  --   CO2 26 24  --   BUN 46* 61*  --   CREATININE 1.71* 2.84*  --   CALCIUM 9.0 8.8*  --   PROT 7.3  --   --   BILITOT 1.4*  --   --   ALKPHOS 390*  --   --   ALT 32  --   --   AST 40  --   --   GLUCOSE 48* 218*  --    Lab Results  Component Value Date   TROPONINI 0.07* 11/13/2014   No results found for: CHOL No results found for: HDL No results found for: LDLCALC No results found for: TRIG No results found for: CHOLHDL No results found for: LDLDIRECT    Radiology: Cardiomegaly, possible edema EKG:  Sinus tachycardia, septal Q waves, nonspecific ST segment changes ASSESSMENT AND PLAN:  Principal Problem:   Acute diastolic CHF (congestive heart failure) Active Problems:   CHF (congestive heart failure)   Essential hypertension   Hypoglycemia   Chronic renal insufficiency   Diabetes mellitus type II, uncontrolled   Peripheral neuropathy   Congestive heart disease  Acute systolic heart failure. Echocardiogram showed normal LV function back in 2015. He feels better after diuresis but he has had significant worsening of his renal function. This likely represents cardiorenal syndrome.  He will need advanced heart failure management. I have spoken to Dr. Haroldine Laws of the heart failure service. The patient should be transferred tomorrow to 2 heart step down at [facility] and the heart failure service will consult on this patient.  Due to his renal dysfunction, he is  not a candidate for ACE inhibitor nor any type of ischemic evaluation. Beta blocker was started. Would not uptitrate at this point given his likely low output state.  I explained all of this to the patient. He understands and is willing to be transferred tomorrow.   Signed:   Mina Marble, MD, [facility] 11/14/2014, 7:38 PM

## 2014-11-15 NOTE — Progress Notes (Addendum)
[facility] PROGRESS NOTE Interval history: 54 year old male with past medical history of hypertension uncontrolled, diabetes mellitus with a normal ejection fraction by echo in May 2015 the presented to the ED with shortness of breath. He relates his shortness of breath has been getting worse over the past 2-3 weeks, he denies any chest pain. He was admitted to the hospital for acute decompensated heart failure, we are consulted it started diuresis, his renal function worsen so cardiology was consulted. 2-D echo results shows a worsening ejection fraction of 25% on 11/13/2014. Cardiology recommended consultation of the advanced heart failure team. Which requested the patient be transferred to [facility] for further therapy. He'll be transferred to the ICU under the advanced heart failure team once patient is transferred to stepdown [facility] will take over.  Filed Weights   11/13/14 0607 11/14/14 0519 11/15/14 0443  Weight: 111.1 kg (244 lb 14.9 oz) 107.457 kg (236 lb 14.4 oz) 107.094 kg (236 lb 1.6 oz)        Intake/Output Summary (Last 24 hours) at 11/15/14 4098 Last data filed at 11/15/14 0700  Gross per 24 hour  Intake    830 ml  Output    225 ml  Net    605 ml     Assessment/Plan: Acute systolic CHF (congestive heart failure) with an ejection fraction 25%/cardiorenal syndrome with renal failure: - Previous 2-D echo showed an EF of 55% back in May 2015. Repeat a 2-D echo on 11/13/2014 showed an EF of 25% with diffuse hypokinesia and akinesia of the basal mid inferior septal apical segment. - I have personally reviewed chest x-ray showed pulmonary edema with bilateral pleural effusions. - Cardiac markers just mildly elevated, he relates no chest pain in the previous days. - He was started on IV Lasix with worsening renal function. - He is not on able to tolerate beta blocker, not a candidate for ace inhibitors at this point due to worsening renal function. - Renal ultrasound  showed chronic tendencies.  Mild hyperkalemia: He was given a single dose of Kayexalate and his potassium slightly improved to 5.5 we'll go ahead and repeat the dose of Kayexalate and recheck a basic metabolic panel in the morning.  Essential hypertension  - Currently on no antihypertensive medication his blood pressure is borderline.  Acute kidney injury: As likely due to acute compensated heart failure. Worsening continue to monitor.  Diabetes mellitus type II, uncontrolled: - Blood glucose has been trending high from Lantus twice a day plus sliding scale insulin. - His renal for patient has worsened, which may lead to episodes of hypoglycemia. I will continue current treatment at this point. Will monitor blood glucose closely.  Elevated troponin: - Most likely demand ischemia given acute decompensation of his CHF  Code Status: full Family Communication: none  Disposition Plan: Unknown   Consultants:  cardiolog   Advance HF  Procedures: ECHO: ef 25 %.  Antibiotics: none HPI/Subjective: Relates his shortness of breath does not improve. Not regularly flat.  Objective: Filed Vitals:   11/14/14 2049 11/14/14 2352 11/15/14 0234 11/15/14 0443  BP: 85/61 101/66 100/60 98/63  Pulse: 97  98 90  Temp: 98.5 F (36.9 C)  98.4 F (36.9 C) 98.5 F (36.9 C)  TempSrc: Oral  Oral Oral  Resp: Height:      Weight:    107.094 kg (236 lb 1.6 oz)  SpO2: 98%  98% 100%     Exam:  General: Alert, awake, oriented  x3, in no acute distress.  HEENT: No bruits, no goiter. +JVD Heart: Regular rate and rhythm. 3+ edema up to his sacrum. Lungs: Good air movement, bilateral crackles.  Abdomen: Soft, nontender, nondistended, positive bowel sounds.  Neuro: Grossly intact, nonfocal.   Data Reviewed: Basic Metabolic Panel:  Recent Labs Lab 11/12/14 2345 11/14/14 0515 11/14/14 1823 11/15/14 0315  NA 138 135  --  136  K 4.6 6.2* 5.6* 5.5*  CL 105 101  --  98*  CO2 26  24  --  24  GLUCOSE 48* 218*  --  228*  BUN 46* 61*  --  72*  CREATININE 1.71* 2.84*  --  3.22*  CALCIUM 9.0 8.8*  --  8.8*   Liver Function Tests:  Recent Labs Lab 11/12/14 2345  AST 40  ALT 32  ALKPHOS 390*  BILITOT 1.4*  PROT 7.3  ALBUMIN 3.5    Recent Labs Lab 11/12/14 2345  LIPASE 17*   No results for input(s): AMMONIA in the last 168 hours. CBC:  Recent Labs Lab 11/12/14 2212  WBC 8.7  NEUTROABS 6.5  HGB 10.3*  HCT 33.2*  MCV 68.6*  PLT 432*   Cardiac Enzymes:  Recent Labs Lab 11/12/14 2345 11/13/14 0230 11/13/14 0738 11/13/14 1431  TROPONINI 0.07* 0.07* 0.06* 0.07*   BNP (last 3 results)  Recent Labs  11/12/14 2212  BNP 455.8*    ProBNP (last 3 results) No results for input(s): PROBNP in the last 8760 hours.  CBG:  Recent Labs Lab 11/13/14 2213 11/14/14 0756 11/14/14 1156 11/14/14 1705 11/14/14 2207  GLUCAP 276* 179* 185* 202* 221*    No results found for this or any previous visit (from the past 240 hour(s)).   Studies: US Renal  11/14/2014   CLINICAL DATA:  Acute renal failure.  EXAM: RENAL / URINARY TRACT ULTRASOUND COMPLETE  COMPARISON:  CT of 11/09/2013  FINDINGS: Right Kidney:  Length: 12.3 cm. No hydronephrosis. Normal renal cortical thickness. Mildly increased echogenicity.  Left Kidney:  Length: 11.6 cm. Normal renal cortical thickness and echogenicity. No hydronephrosis. 2 lower pole left renal lesions. Most consistent with cysts or minimally complex cysts. These measure maximally 3.8 and 3.2 cm respectively.  Bladder:  Collapsed around a Foley catheter.  IMPRESSION: 1.  No hydronephrosis. 2. Mildly increased right renal echogenicity, suggesting medical renal disease.   Electronically Signed   By: Jeronimo Greaves M.D.   On: 11/14/2014 21:11    Scheduled Meds: . antiseptic oral rinse  7 mL Mouth Rinse BID  . DULoxetine  60 mg Oral Daily  . enoxaparin (LOVENOX) injection  30 mg Subcutaneous Daily  . furosemide  40 mg  Intravenous Q12H  . insulin aspart  0-5 Units Subcutaneous QHS  . insulin aspart  0-9 Units Subcutaneous TID WC  . insulin glargine  20 Units Subcutaneous QHS  . nicotine  14 mg Transdermal Daily  . olopatadine  1 drop Both Eyes BID  . pantoprazole  40 mg Oral Daily  . pregabalin  150 mg Oral BID  . simvastatin  10 mg Oral q1800  . sodium chloride  3 mL Intravenous Q12H  . cyanocobalamin  1,000 mcg Oral Daily   Continuous Infusions:  Time spent with the patient over 35 minutes.  Marinda Elk  [facility] Pager [phone] If 7PM-7AM, please contact night-coverage at [email or web address], password [facility] 11/15/2014, 7:42 AM  LOS: 2 days

## 2014-11-15 NOTE — Progress Notes (Signed)
Patient had 7 beats of Vtach. VSS patient asymptomatic. NP on call made aware. Will continue to monitor.

## 2014-11-15 NOTE — Progress Notes (Signed)
Called [facility] and gave report to [facility] RN who will resume care of pt, [id number].

## 2014-11-15 NOTE — Progress Notes (Signed)
Patient Name: Christian Wall Date of Encounter: 11/15/2014  Principal Problem:   Acute systolic CHF (congestive heart failure), NYHA class 3 Active Problems:   CHF (congestive heart failure)   Essential hypertension   Hypoglycemia   Chronic renal insufficiency   Diabetes mellitus type II, uncontrolled   Peripheral neuropathy   Primary Cardiologist: Dr Eldridge Dace (new)  Patient Profile: 54 yo male w/ hx nl EF 2015, remote pancreatitis, DM, ETOH abuse (quit > 5 year) admitted 05/09 w/ CHF, EF now 20-25%. Cards consulted 05/10 for worsening renal function w/ diuresis. Anemic on admission, H&H about the same as at d/c 2015.  SUBJECTIVE: Still requires O2 at rest, thinks had gained about 20 lbs PTA  OBJECTIVE Filed Vitals:   11/14/14 2049 11/14/14 2352 11/15/14 0234 11/15/14 0443  BP: 85/61 101/66 100/60 98/63  Pulse: 97  98 90  Temp: 98.5 F (36.9 C)  98.4 F (36.9 C) 98.5 F (36.9 C)  TempSrc: Oral  Oral Oral  Resp: 20  19 19   Height:      Weight:    236 lb 1.6 oz (107.094 kg)  SpO2: 98%  98% 100%    Intake/Output Summary (Last 24 hours) at 11/15/14 1028 Last data filed at 11/15/14 0700  Gross per 24 hour  Intake    830 ml  Output    225 ml  Net    605 ml   Filed Weights   11/13/14 0607 11/14/14 0519 11/15/14 0443  Weight: 244 lb 14.9 oz (111.1 kg) 236 lb 14.4 oz (107.457 kg) 236 lb 1.6 oz (107.094 kg)    PHYSICAL EXAM General: Well developed, well nourished, male in no acute distress. Head: Normocephalic, atraumatic.  Neck: Supple without bruits, JVD to ear Lungs:  Resp regular and unlabored, rales bases. Heart: RRR, S1, S2, + S3, S4, or murmur; no rub. Abdomen: obese Soft, non-tender, non-distended, BS + x 4.  Extremities: No clubbing, cyanosis, 2+ edema.  Neuro: Alert and oriented X 3. Moves all extremities spontaneously. Psych: Normal affect.  LABS: CBC:  Recent Labs  11/12/14 2212  WBC 8.7  NEUTROABS 6.5  HGB 10.3*  HCT 33.2*  MCV 68.6*    PLT 432*   Basic Metabolic Panel:  Recent Labs  75/17/00 0515 11/14/14 1823 11/15/14 0315  NA 135  --  136  K 6.2* 5.6* 5.5*  CL 101  --  98*  CO2 24  --  24  GLUCOSE 218*  --  228*  BUN 61*  --  72*  CREATININE 2.84*  --  3.22*  CALCIUM 8.8*  --  8.8*   Liver Function Tests:  Recent Labs  11/12/14 2345  AST 40  ALT 32  ALKPHOS 390*  BILITOT 1.4*  PROT 7.3  ALBUMIN 3.5   Cardiac Enzymes:  Recent Labs  11/13/14 0230 11/13/14 0738 11/13/14 1431  TROPONINI 0.07* 0.06* 0.07*   BNP:  B NATRIURETIC PEPTIDE  Date/Time Value Ref Range Status  11/12/2014 10:12 PM 455.8* 0.0 - 100.0 pg/mL Final   Hemoglobin A1C:  Recent Labs  11/13/14 0230  HGBA1C 7.4*   Thyroid Function Tests:  Recent Labs  11/13/14 0230  TSH 2.177    TELE:  SR      ECHO: 11/13/2014  - Left ventricle: The cavity size was mildly dilated. Systolic function was severely reduced. The estimated ejection fraction was in the range of 20% to 25%. Diffuse hypokinesis. There is akinesis of the basal-mid inferoseptal and apical septal myocardium. There  is akinesis of the entireinferior myocardium. - Mitral valve: There was mild regurgitation. - Right atrium: The atrium was moderately dilated. - Tricuspid valve: There was moderate regurgitation. - ? Elevated CVP, but estimated at 3  Radiology/Studies: US Renal  11/14/2014   CLINICAL DATA:  Acute renal failure.  EXAM: RENAL / URINARY TRACT ULTRASOUND COMPLETE  COMPARISON:  CT of 11/09/2013  FINDINGS: Right Kidney:  Length: 12.3 cm. No hydronephrosis. Normal renal cortical thickness. Mildly increased echogenicity.  Left Kidney:  Length: 11.6 cm. Normal renal cortical thickness and echogenicity. No hydronephrosis. 2 lower pole left renal lesions. Most consistent with cysts or minimally complex cysts. These measure maximally 3.8 and 3.2 cm respectively.  Bladder:  Collapsed around a Foley catheter.  IMPRESSION: 1.  No hydronephrosis. 2.  Mildly increased right renal echogenicity, suggesting medical renal disease.   Electronically Signed   By: Jeronimo Greaves M.D.   On: 11/14/2014 21:11     Current Medications:  . antiseptic oral rinse  7 mL Mouth Rinse BID  . DULoxetine  60 mg Oral Daily  . enoxaparin (LOVENOX) injection  30 mg Subcutaneous Daily  . insulin aspart  0-5 Units Subcutaneous QHS  . insulin aspart  0-9 Units Subcutaneous TID WC  . insulin glargine  20 Units Subcutaneous QHS  . nicotine  14 mg Transdermal Daily  . olopatadine  1 drop Both Eyes BID  . pantoprazole  40 mg Oral Daily  . pregabalin  150 mg Oral BID  . simvastatin  10 mg Oral q1800  . sodium chloride  3 mL Intravenous Q12H  . sodium polystyrene  15 g Oral Once  . cyanocobalamin  1,000 mcg Oral Daily      ASSESSMENT AND PLAN: Principal Problem:   Acute systolic CHF (congestive heart failure) - echo results above  - agree w/ holding diuretics as Cr is up - no ACE 2nd ARI on CKD - No BB since BP too low, was on Coreg 3.125 bid, got 3 doses - feel he has low-output state, cardiorenal would benefit from invasive monitoring and advanced therapies.  - tx to [facility] and assessment by CHF team today  Active Problems:   Essential hypertension - per IM - SBP range 85-105 since admit    Hypoglycemia - per IM    Acute on Chronic renal insufficiency - peak Cr 2.3 in 2015 admission, 0.98 at d/c - BUN/Cr 46/1.71 on admission - 46/1.71>>61/2.84>>72/3.22    Diabetes mellitus type II, uncontrolled - Per IM - A1c 7.4    Peripheral neuropathy - per IM    Hyperkalemia - per IM - K+ 4.6 on admit, was 6.2 on 05/10 after total of 20 meq po - had Kayexalate and improved  Signed, Theodore Demark , PA-C 10:28 AM 11/15/2014   Patient seen and examined with Theodore Demark, PA-C. We discussed all aspects of the encounter. I agree with the assessment and plan as stated above.   He has evidence of low output HF with cardiorenal syndrome. Will need RHC  and inotropic support.

## 2014-11-15 NOTE — Interval H&P Note (Signed)
History and Physical Interval Note:  11/15/2014 4:22 PM  Christian Wall  has presented today for surgery, with the diagnosis of HF  The various methods of treatment have been discussed with the patient and family. After consideration of risks, benefits and other options for treatment, the patient has consented to  Procedure(s): Right Heart Cath (N/A) as a surgical intervention .  The patient's history has been reviewed, patient examined, no change in status, stable for surgery.  I have reviewed the patient's chart and labs.  Questions were answered to the patient's satisfaction.     Onofre Gains, [name]

## 2014-11-16 ENCOUNTER — Encounter (HOSPITAL_COMMUNITY): Payer: Self-pay | Admitting: Internal Medicine

## 2014-11-16 DIAGNOSIS — N189 Chronic kidney disease, unspecified: Secondary | ICD-10-CM

## 2014-11-16 LAB — BASIC METABOLIC PANEL
Anion gap: 9 (ref 5–15)
BUN: 73 mg/dL — ABNORMAL HIGH (ref 6–20)
CHLORIDE: 100 mmol/L — AB (ref 101–111)
CO2: 27 mmol/L (ref 22–32)
Calcium: 8.4 mg/dL — ABNORMAL LOW (ref 8.9–10.3)
Creatinine, Ser: 2.57 mg/dL — ABNORMAL HIGH (ref 0.61–1.24)
GFR, EST AFRICAN AMERICAN: 31 mL/min — AB (ref >60)
GFR, EST NON AFRICAN AMERICAN: 27 mL/min — AB (ref >60)
Glucose, Bld: 357 mg/dL — ABNORMAL HIGH (ref 65–99)
POTASSIUM: 4.5 mmol/L (ref 3.5–5.1)
SODIUM: 136 mmol/L (ref 135–145)

## 2014-11-16 LAB — GLUCOSE, CAPILLARY
GLUCOSE-CAPILLARY: 344 mg/dL — AB (ref 65–99)
GLUCOSE-CAPILLARY: 403 mg/dL — AB (ref 65–99)
Glucose-Capillary: 335 mg/dL — ABNORMAL HIGH (ref 65–99)
Glucose-Capillary: 386 mg/dL — ABNORMAL HIGH (ref 65–99)
Glucose-Capillary: 357 mg/dL — ABNORMAL HIGH (ref 65–99)

## 2014-11-16 LAB — CBC
HCT: 26.8 % — ABNORMAL LOW (ref 39.0–52.0)
HEMATOCRIT: 28.4 % — AB (ref 39.0–52.0)
HEMOGLOBIN: 8.4 g/dL — AB (ref 13.0–17.0)
Hemoglobin: 8.9 g/dL — ABNORMAL LOW (ref 13.0–17.0)
MCH: 21.5 pg — AB (ref 26.0–34.0)
MCH: 21.3 pg — ABNORMAL LOW (ref 26.0–34.0)
MCHC: 31.3 g/dL (ref 30.0–36.0)
MCHC: 31.3 g/dL (ref 30.0–36.0)
MCV: 68.8 fL — AB (ref 78.0–100.0)
MCV: 68 fL — ABNORMAL LOW (ref 78.0–100.0)
Platelets: 302 10*3/uL (ref 150–400)
Platelets: 274 10*3/uL (ref 150–400)
RBC: 4.13 MIL/uL — ABNORMAL LOW (ref 4.22–5.81)
RBC: 3.94 MIL/uL — ABNORMAL LOW (ref 4.22–5.81)
RDW: 18.4 % — AB (ref 11.5–15.5)
RDW: 18 % — ABNORMAL HIGH (ref 11.5–15.5)
WBC: 7.8 10*3/uL (ref 4.0–10.5)
WBC: 8.3 10*3/uL (ref 4.0–10.5)

## 2014-11-16 LAB — CARBOXYHEMOGLOBIN
CARBOXYHEMOGLOBIN: 2 % — AB (ref 0.5–1.5)
Methemoglobin: 0.9 % (ref 0.0–1.5)
O2 SAT: 59.7 %
Total hemoglobin: 8.6 g/dL — ABNORMAL LOW (ref 13.5–18.0)

## 2014-11-16 LAB — MRSA PCR SCREENING: MRSA by PCR: NEGATIVE

## 2014-11-16 LAB — MAGNESIUM: Magnesium: 1.5 mg/dL — ABNORMAL LOW (ref 1.7–2.4)

## 2014-11-16 MED ORDER — HYDRALAZINE HCL 25 MG PO TABS
12.5000 mg | ORAL_TABLET | Freq: Three times a day (TID) | ORAL | Status: DC
  Administered 2014-11-16 – 2014-11-17 (×4): 12.5 mg via ORAL
  Filled 2014-11-16 (×8): qty 0.5

## 2014-11-16 MED ORDER — INSULIN ASPART 100 UNIT/ML ~~LOC~~ SOLN
6.0000 [IU] | Freq: Once | SUBCUTANEOUS | Status: AC
Start: 2014-11-16 — End: 2014-11-16
  Administered 2014-11-16: 6 [IU] via SUBCUTANEOUS

## 2014-11-16 MED ORDER — INSULIN GLARGINE 100 UNIT/ML ~~LOC~~ SOLN
20.0000 [IU] | Freq: Two times a day (BID) | SUBCUTANEOUS | Status: DC
  Administered 2014-11-16 (×2): 20 [IU] via SUBCUTANEOUS
  Filled 2014-11-16 (×3): qty 0.2

## 2014-11-16 MED ORDER — ISOSORBIDE MONONITRATE 15 MG HALF TABLET
15.0000 mg | ORAL_TABLET | Freq: Every day | ORAL | Status: DC
  Administered 2014-11-16: 15 mg via ORAL
  Filled 2014-11-16 (×3): qty 1

## 2014-11-16 MED ORDER — MAGNESIUM SULFATE 2 GM/50ML IV SOLN
2.0000 g | Freq: Once | INTRAVENOUS | Status: AC
  Administered 2014-11-16: 2 g via INTRAVENOUS
  Filled 2014-11-16: qty 50

## 2014-11-16 MED FILL — Lidocaine HCl Local Preservative Free (PF) Inj 1%: INTRAMUSCULAR | Qty: 30 | Status: AC

## 2014-11-16 MED FILL — Heparin Sodium (Porcine) 2 Unit/ML in Sodium Chloride 0.9%: INTRAMUSCULAR | Qty: 1000 | Status: AC

## 2014-11-16 NOTE — Progress Notes (Addendum)
Advanced Heart Failure Rounding Note   Subjective:   54 yo male w/ hx nl EF 2015, remote pancreatitis, DM, ETOH abuse (quit > 5 year) admitted 05/09 w/ CHF, EF now 20-25%. Limited mobility uses crutches.   Yesterday he was transferred to [facility] for HF input due to worsening renal function. Had RHC as noted below with cardiogenic shock, low output, and elevated filling pressures. Started on milrinone and diuresing with IV lasix. Increased urine output noted. CO-OX 60% .  Mag 1.5.   Swan Numbers RA  16 PAP 39/20 PCWP 18 CO/CI 5.7/2.6   RHC 11/15/2014.  RA = 29 RV = 51/21/30 PA = 48/29 (38) PCW = 30 Fick cardiac output/index = 3.8/1.7 Thermo CO/CI = 3.7/1.7 PVR = 2.9 WU SVR = 1,502  Ao sat = 100% PA sat = 45%, 45% Ao = 131/86 (100)     Objective:   Weight Range:  Vital Signs:   Temp:  [96.6 F (35.9 C)-98.1 F (36.7 C)] 97.5 F (36.4 C) (05/12 0700) Pulse Rate:  [0-102] 100 (05/12 0700) Resp:  [0-24] 16 (05/12 0700) BP: (90-142)/(51-96) 129/64 mmHg (05/12 0700) SpO2:  [0 %-100 %] 97 % (05/12 0700) Weight:  [237 lb 8 oz (107.729 kg)-240 lb 15.4 oz (109.3 kg)] 237 lb 8 oz (107.729 kg) (05/12 0417) Last BM Date: 11/12/14  Weight change: Filed Weights   11/15/14 0443 11/15/14 1143 11/16/14 0417  Weight: 236 lb 1.6 oz (107.094 kg) 240 lb 15.4 oz (109.3 kg) 237 lb 8 oz (107.729 kg)    Intake/Output:   Intake/Output Summary (Last 24 hours) at 11/16/14 0711 Last data filed at 11/16/14 0700  Gross per 24 hour  Intake 694.52 ml  Output   2075 ml  Net -1380.48 ml     Physical Exam: General:  Well appearing. No resp difficulty HEENT: normal Neck: supple. JVP to jaw. Carotids 2+ bilat; no bruits. No lymphadenopathy or thryomegaly appreciated. Cor: PMI nondisplaced. Regular rate & rhythm. 2/6 MR. + S3  Lungs: clear Abdomen: soft, nontender, nondistended. No hepatosplenomegaly. No bruits or masses. Good bowel sounds. Extremities: no cyanosis, clubbing, rash,  RLE and  LLE 3+ edema into thigh Neuro: alert & orientedx3, cranial nerves grossly intact. moves all 4 extremities w/o difficulty. Affect pleasant  Telemetry: NSR 90s   Labs: Basic Metabolic Panel:  Recent Labs Lab 11/12/14 2345 11/14/14 0515 11/14/14 1823 11/15/14 0315 11/15/14 2010 11/16/14 0425  NA 138 135  --  136  --   --   K 4.6 6.2* 5.6* 5.5*  --   --   CL 105 101  --  98*  --   --   CO2 26 24  --  24  --   --   GLUCOSE 48* 218*  --  228*  --   --   BUN 46* 61*  --  72*  --   --   CREATININE 1.71* 2.84*  --  3.22* 2.88*  --   CALCIUM 9.0 8.8*  --  8.8*  --   --   MG  --   --   --   --   --  1.5*    Liver Function Tests:  Recent Labs Lab 11/12/14 2345  AST 40  ALT 32  ALKPHOS 390*  BILITOT 1.4*  PROT 7.3  ALBUMIN 3.5    Recent Labs Lab 11/12/14 2345  LIPASE 17*   No results for input(s): AMMONIA in the last 168 hours.  CBC:  Recent Labs Lab 11/12/14  2212 11/15/14 2010 11/16/14 0421  WBC 8.7 7.8 8.3  NEUTROABS 6.5  --   --   HGB 10.3* 8.9* 8.4*  HCT 33.2* 28.4* 26.8*  MCV 68.6* 68.8* 68.0*  PLT 432* 302 274    Cardiac Enzymes:  Recent Labs Lab 11/12/14 2345 11/13/14 0230 11/13/14 0738 11/13/14 1431  TROPONINI 0.07* 0.07* 0.06* 0.07*    BNP: BNP (last 3 results)  Recent Labs  11/12/14 2212  BNP 455.8*    ProBNP (last 3 results) No results for input(s): PROBNP in the last 8760 hours.    Other results:  Imaging: US Renal  11/14/2014   CLINICAL DATA:  Acute renal failure.  EXAM: RENAL / URINARY TRACT ULTRASOUND COMPLETE  COMPARISON:  CT of 11/09/2013  FINDINGS: Right Kidney:  Length: 12.3 cm. No hydronephrosis. Normal renal cortical thickness. Mildly increased echogenicity.  Left Kidney:  Length: 11.6 cm. Normal renal cortical thickness and echogenicity. No hydronephrosis. 2 lower pole left renal lesions. Most consistent with cysts or minimally complex cysts. These measure maximally 3.8 and 3.2 cm respectively.  Bladder:  Collapsed  around a Foley catheter.  IMPRESSION: 1.  No hydronephrosis. 2. Mildly increased right renal echogenicity, suggesting medical renal disease.   Electronically Signed   By: Jeronimo Greaves M.D.   On: 11/14/2014 21:11      Medications:     Scheduled Medications: . antiseptic oral rinse  7 mL Mouth Rinse BID  . DULoxetine  60 mg Oral Daily  . enoxaparin (LOVENOX) injection  30 mg Subcutaneous Daily  . furosemide  80 mg Intravenous BID  . insulin aspart  0-5 Units Subcutaneous QHS  . insulin aspart  0-9 Units Subcutaneous TID WC  . insulin glargine  20 Units Subcutaneous QHS  . lipase/protease/amylase  3 capsule Oral BID AC  . nicotine  14 mg Transdermal Daily  . olopatadine  1 drop Both Eyes BID  . pantoprazole  40 mg Oral Daily  . pregabalin  150 mg Oral BID  . simvastatin  10 mg Oral q1800  . sodium chloride  3 mL Intravenous Q12H  . sodium chloride  3 mL Intravenous Q12H  . sodium chloride  3 mL Intravenous Q12H  . sodium polystyrene  15 g Oral Once  . cyanocobalamin  1,000 mcg Oral Daily     Infusions: . milrinone 0.25 mcg/kg/min (11/16/14 0419)     PRN Medications:  sodium chloride, sodium chloride, sodium chloride, acetaminophen, benzonatate, ondansetron (ZOFRAN) IV, sodium chloride, sodium chloride, sodium chloride, tiZANidine, traMADol   Assessment:  1. Cardiogenic Shock 2. A/C Systolic Heart Failure  3. HTN 4. A/C CKD  5. DM II 6. Hypomagnesium  7. Hyperkalemia 8. H/O Pancreatitis 9. Limited mobility   Plan/Discussion:   RHC yesterday with cardiogenic shock noted and elevated filling pressures.  Volume status remains high. Continue to diurese with IV lasix.  CO-OX this morning 60% on 0.25 mcg milrinone. BMET pending. No BB with low output. Add hydralazine 12.5 mg tid + imdur 15 mg daily. No ace or spiro with ckd.   Mag low give 2 grams mag now.   Glucose high. Adjust lantus. Check Hgb A1C.   Length of Stay: 3 CLEGG,AMY NP-C 11/16/2014, 7:11 AM Advanced  Heart Failure Team Pager [phone] (M-F; 7a - 4p)  Please contact [facility] for night-coverage after hours (4p -7a ) and weekends on [email or web address]  Patient seen and examined with [name], NP. We discussed all aspects of the encounter. I agree with the assessment and  plan as stated above.   Hemodynamics and renal function improving with milrinone. Swan numbers c/w with R>L HF.  Remains volume overloaded. Will continue milrinone and IV lasix. Agree with hydralazine and nitrates. No b-blocker and ACE. Supp mag with 4 grams Increase insulin.   Yet to be determined if he will need home inotropes or other advanced therapies.   The patient is critically ill with multiple organ systems failure and requires high complexity decision making for assessment and support, frequent evaluation and titration of therapies, application of advanced monitoring technologies and extensive interpretation of multiple databases.   Critical Care Time personally devoted to patient care services described in this note is 35 Minutes.  Hoorain Kozakiewicz,MD 8:37 AM

## 2014-11-16 NOTE — Progress Notes (Signed)
Results for ALUCARD, KOVALCHUK (MRN [id number]) as of 11/16/2014 09:58  Ref. Range 11/14/2014 22:07 11/15/2014 08:04 11/15/2014 12:00 11/15/2014 17:59 11/15/2014 21:27  Glucose-Capillary Latest Ref Range: 70-99 mg/dL 683 (H) 419 (H) 622 (H) 237 (H) 279 (H)  CBGs continue to be elevated and greater than 180 mg/dl.  Recommend increasing Lantus to 25 units BID and continuing Novolog SENSITIVE correction scale TID & HS if CBGs continue to be greater than 180 mg/dl. Will continue to follow while in hospital. Smith Mince RN BSN CDE

## 2014-11-16 NOTE — Progress Notes (Signed)
Patients CBG 403, called and spoke with MD Zachery Conch.  MD Zachery Conch advised to give a one time dose of 6 units insulin and to recheck patients blood sugar in one hour.  If CBG 400 or greater, call MD back for further orders.  Ivery Quale, RN

## 2014-11-16 NOTE — Care Management Note (Signed)
Case Management Note  Patient Details  Name: Mikos Enter MRN: [id number] Date of Birth: 08-Jun-1961  Subjective/Objective:                    Action/Plan:   Expected Discharge Date:  11/15/14               Expected Discharge Plan:  Home/Self Care  In-House Referral:     Discharge planning Services  CM Consult  Post Acute Care Choice:    Choice offered to:     DME Arranged:    DME Agency:     HH Arranged:    HH Agency:     Status of Service:  In process, will continue to follow  Medicare Important Message Given:  Yes Date Medicare IM Given:  11/16/14 Medicare IM give by:  debbie Jasneet Schobert rn,bsn Date Additional Medicare IM Given:    Additional Medicare Important Message give by:     If discussed at Long Length of Stay Meetings, dates discussed:    Additional Comments:  Hanley Hays, RN 11/16/2014, 3:35 PM

## 2014-11-17 ENCOUNTER — Inpatient Hospital Stay (HOSPITAL_COMMUNITY): Payer: Medicare Other

## 2014-11-17 LAB — BASIC METABOLIC PANEL
Anion gap: 8 (ref 5–15)
BUN: 60 mg/dL — AB (ref 6–20)
CO2: 30 mmol/L (ref 22–32)
Calcium: 8.5 mg/dL — ABNORMAL LOW (ref 8.9–10.3)
Chloride: 95 mmol/L — ABNORMAL LOW (ref 101–111)
Creatinine, Ser: 1.8 mg/dL — ABNORMAL HIGH (ref 0.61–1.24)
GFR, EST AFRICAN AMERICAN: 48 mL/min — AB (ref >60)
GFR, EST NON AFRICAN AMERICAN: 41 mL/min — AB (ref >60)
GLUCOSE: 316 mg/dL — AB (ref 65–99)
Potassium: 4.1 mmol/L (ref 3.5–5.1)
Sodium: 133 mmol/L — ABNORMAL LOW (ref 135–145)

## 2014-11-17 LAB — GLUCOSE, CAPILLARY
GLUCOSE-CAPILLARY: 260 mg/dL — AB (ref 65–99)
Glucose-Capillary: 304 mg/dL — ABNORMAL HIGH (ref 65–99)
Glucose-Capillary: 314 mg/dL — ABNORMAL HIGH (ref 65–99)

## 2014-11-17 LAB — CBC
HCT: 28.8 % — ABNORMAL LOW (ref 39.0–52.0)
Hemoglobin: 8.8 g/dL — ABNORMAL LOW (ref 13.0–17.0)
MCH: 20.9 pg — AB (ref 26.0–34.0)
MCHC: 30.6 g/dL (ref 30.0–36.0)
MCV: 68.4 fL — AB (ref 78.0–100.0)
Platelets: 132 10*3/uL — ABNORMAL LOW (ref 150–400)
RBC: 4.21 MIL/uL — ABNORMAL LOW (ref 4.22–5.81)
RDW: 18.2 % — AB (ref 11.5–15.5)
WBC: 11.4 10*3/uL — ABNORMAL HIGH (ref 4.0–10.5)

## 2014-11-17 LAB — FOLATE: FOLATE: 23 ng/mL (ref >5.9)

## 2014-11-17 LAB — RETICULOCYTES
RBC.: 4.21 MIL/uL — AB (ref 4.22–5.81)
Retic Count, Absolute: 122.1 10*3/uL (ref 19.0–186.0)
Retic Ct Pct: 2.9 % (ref 0.4–3.1)

## 2014-11-17 LAB — CARBOXYHEMOGLOBIN
CARBOXYHEMOGLOBIN: 1.7 % — AB (ref 0.5–1.5)
Methemoglobin: 0.8 % (ref 0.0–1.5)
O2 Saturation: 61.7 %
Total hemoglobin: 9.3 g/dL — ABNORMAL LOW (ref 13.5–18.0)

## 2014-11-17 LAB — IRON AND TIBC
IRON: 22 ug/dL — AB (ref 45–182)
Saturation Ratios: 6 % — ABNORMAL LOW (ref 17.9–39.5)
TIBC: 385 ug/dL (ref 250–450)
UIBC: 363 ug/dL

## 2014-11-17 LAB — VITAMIN B12: VITAMIN B 12: 2554 pg/mL — AB (ref 180–914)

## 2014-11-17 LAB — FERRITIN: Ferritin: 18 ng/mL — ABNORMAL LOW (ref 24–336)

## 2014-11-17 MED ORDER — INSULIN GLARGINE 100 UNIT/ML ~~LOC~~ SOLN
30.0000 [IU] | Freq: Every day | SUBCUTANEOUS | Status: DC
  Administered 2014-11-17 – 2014-11-20 (×4): 30 [IU] via SUBCUTANEOUS
  Filled 2014-11-17 (×6): qty 0.3

## 2014-11-17 MED ORDER — INSULIN GLARGINE 100 UNIT/ML ~~LOC~~ SOLN
30.0000 [IU] | Freq: Every day | SUBCUTANEOUS | Status: DC
  Administered 2014-11-17: 30 [IU] via SUBCUTANEOUS
  Filled 2014-11-17: qty 0.3

## 2014-11-17 MED ORDER — ENOXAPARIN SODIUM 40 MG/0.4ML ~~LOC~~ SOLN
40.0000 mg | SUBCUTANEOUS | Status: DC
  Administered 2014-11-17: 40 mg via SUBCUTANEOUS
  Filled 2014-11-17: qty 0.4

## 2014-11-17 MED ORDER — ENOXAPARIN SODIUM 60 MG/0.6ML ~~LOC~~ SOLN
50.0000 mg | SUBCUTANEOUS | Status: DC
  Filled 2014-11-17: qty 0.6

## 2014-11-17 MED ORDER — SODIUM CHLORIDE 0.9 % IV SOLN
510.0000 mg | INTRAVENOUS | Status: DC
  Administered 2014-11-17: 510 mg via INTRAVENOUS
  Filled 2014-11-17: qty 17

## 2014-11-17 MED ORDER — METOLAZONE 5 MG PO TABS
5.0000 mg | ORAL_TABLET | Freq: Two times a day (BID) | ORAL | Status: DC
  Administered 2014-11-17 – 2014-11-18 (×3): 5 mg via ORAL
  Filled 2014-11-17 (×4): qty 1

## 2014-11-17 MED ORDER — ENOXAPARIN SODIUM 60 MG/0.6ML ~~LOC~~ SOLN
50.0000 mg | SUBCUTANEOUS | Status: DC
  Administered 2014-11-18 – 2014-11-19 (×2): 50 mg via SUBCUTANEOUS
  Filled 2014-11-17 (×3): qty 0.6

## 2014-11-17 MED ORDER — ALBUTEROL SULFATE (2.5 MG/3ML) 0.083% IN NEBU: 2.5000 mg | INHALATION_SOLUTION | RESPIRATORY_TRACT | Status: DC | PRN

## 2014-11-17 MED ORDER — HYDRALAZINE HCL 25 MG PO TABS
25.0000 mg | ORAL_TABLET | Freq: Three times a day (TID) | ORAL | Status: DC
  Administered 2014-11-17 – 2014-11-18 (×3): 25 mg via ORAL
  Filled 2014-11-17 (×6): qty 1

## 2014-11-17 MED ORDER — PREGABALIN 50 MG PO CAPS
150.0000 mg | ORAL_CAPSULE | Freq: Two times a day (BID) | ORAL | Status: DC
  Administered 2014-11-17 – 2014-11-21 (×9): 150 mg via ORAL
  Filled 2014-11-17 (×18): qty 1

## 2014-11-17 MED ORDER — GUAIFENESIN-DM 100-10 MG/5ML PO SYRP
5.0000 mL | ORAL_SOLUTION | ORAL | Status: DC | PRN
  Administered 2014-11-17 (×3): 5 mL via ORAL
  Filled 2014-11-17 (×3): qty 5

## 2014-11-17 MED ORDER — INSULIN GLARGINE 100 UNIT/ML ~~LOC~~ SOLN
40.0000 [IU] | Freq: Every day | SUBCUTANEOUS | Status: DC
  Administered 2014-11-18 – 2014-11-21 (×4): 40 [IU] via SUBCUTANEOUS
  Filled 2014-11-17 (×4): qty 0.4

## 2014-11-17 MED ORDER — INSULIN GLARGINE 100 UNIT/ML ~~LOC~~ SOLN
20.0000 [IU] | Freq: Every day | SUBCUTANEOUS | Status: DC
  Filled 2014-11-17: qty 0.2

## 2014-11-17 MED ORDER — ISOSORBIDE MONONITRATE ER 30 MG PO TB24
30.0000 mg | ORAL_TABLET | Freq: Every day | ORAL | Status: DC
  Administered 2014-11-17 – 2014-11-18 (×2): 30 mg via ORAL
  Filled 2014-11-17 (×3): qty 1

## 2014-11-17 NOTE — Progress Notes (Signed)
Inpatient Diabetes Program Recommendations  AACE/ADA: New Consensus Statement on Inpatient Glycemic Control (2013)  Target Ranges:  Prepandial:   less than 140 mg/dL      Peak postprandial:   less than 180 mg/dL (1-2 hours)      Critically ill patients:  140 - 180 mg/dL   Results for Christian Wall, Christian Wall (MRN [id number]) as of 11/17/2014 10:06  Ref. Range 11/16/2014 08:09 11/16/2014 12:15 11/16/2014 16:42 11/16/2014 21:34 11/16/2014 23:06 11/17/2014 07:29  Glucose-Capillary Latest Ref Range: 65-99 mg/dL 599 (H) 774 (H) 142 (H) 403 (H) 357 (H) 260 (H)   Diabetes history: DM 2 Outpatient Diabetes medications: Lantus 50 units QAM, 20 units QPM, Actos 15 mg Daily, Metformin 850 mg BID  Inpatient Diabetes Program Recommendations  Insulin - IV drip/GlucoStabilizer: While patient is in the ICU side and glucose levels are 200-300 range, consider starting patient on glucostabilizer for glucose control and we can see what insulin needs the patient requires and dose off of the stabilizer numbers. Or if you wish to keep on SQ insulin... Insulin - Basal: Based on glucose trends, please consider increasing basal insulin to Lantus 40 units QAM (10 units of additional Lantus to give this am) and 25 units QPM. Fasting was still 260's this am. Correction (SSI): Increase Novolog to moderate 0-15 units TID.  HgbA1C: 7.4% - H/H low  Thanks,  Christena Deem RN, MSN, [facility] Inpatient Diabetes Coordinator Team Pager [phone]

## 2014-11-17 NOTE — Progress Notes (Signed)
Pt monitor showed EKG changes, alarm ringing VT, Hr 112. Pt was asymptomatic and stated he felt "fine". Stat 12 lead EKG completed and Cardiology Fellow notified. No orders placed. Pt remains asymptotic, monitor currently reading ST. Will continue to monitor pt.

## 2014-11-17 NOTE — Progress Notes (Signed)
Advanced Heart Failure Rounding Note   Subjective:   54 yo male w/ hx nl EF 2015, remote pancreatitis, DM, ETOH abuse (quit > 5 year) admitted 05/09 w/ CHF, EF now 20-25%. Limited mobility uses crutches.   Yesterday he was transferred to [facility] for HF input due to worsening renal function. Had RHC as noted below with cardiogenic shock, low output, and elevated filling pressures. Started on milrinone and diuresing with IV lasix. Renal function continues to improve. CO-OX 61% . Increased cough over night with white sputum. No fevers or chills  Denies Dyspnea.   Swan Numbers done personally  RA     13 PAP  58/33 (42)    PCWP 21 CO/CI  5.5/2.5  RHC 11/15/2014.  RA = 29 RV = 51/21/30 PA = 48/29 (38) PCW = 30 Fick cardiac output/index = 3.8/1.7 Thermo CO/CI = 3.7/1.7 PVR = 2.9 WU SVR = 1,502  Ao sat = 100% PA sat = 45%, 45% Ao = 131/86 (100)    Objective:   Weight Range:  Vital Signs:   Temp:  [96.8 F (36 C)-98.8 F (37.1 C)] 98.4 F (36.9 C) (05/13 0600) Pulse Rate:  [94-110] 104 (05/13 0600) Resp:  [13-30] 16 (05/13 0600) BP: (84-149)/(43-76) 132/65 mmHg (05/13 0600) SpO2:  [91 %-100 %] 97 % (05/13 0600) Weight:  [234 lb 4.8 oz (106.278 kg)] 234 lb 4.8 oz (106.278 kg) (05/13 0300) Last BM Date: 11/15/14  Weight change: Filed Weights   11/15/14 1143 11/16/14 0417 11/17/14 0300  Weight: 240 lb 15.4 oz (109.3 kg) 237 lb 8 oz (107.729 kg) 234 lb 4.8 oz (106.278 kg)    Intake/Output:   Intake/Output Summary (Last 24 hours) at 11/17/14 0742 Last data filed at 11/17/14 0600  Gross per 24 hour  Intake 1780.4 ml  Output   4780 ml  Net -2999.6 ml     Physical Exam: CVP 13  General:  Sitting in chair. No resp difficulty HEENT: normal Neck: supple. JVP to jaw. RIJ swan Carotids 2+ bilat; no bruits. No lymphadenopathy or thryomegaly appreciated. Cor: PMI laterally displaced. Tachy regular. 2/6 MR. + S3  Lungs: mild Rhonchi throughout.  Abdomen: soft, nontender,  nondistended. No hepatosplenomegaly. No bruits or masses. Good bowel sounds. Extremities: no cyanosis, clubbing, rash,  RLE and LLE 3+ edema into thigh Neuro: alert & orientedx3, cranial nerves grossly intact. moves all 4 extremities w/o difficulty. Affect pleasant  Telemetry: NSR 90-105   Labs: Basic Metabolic Panel:  Recent Labs Lab 11/12/14 2345 11/14/14 0515 11/14/14 1823 11/15/14 0315 11/15/14 2010 11/16/14 0425 11/17/14 0305  NA 138 135  --  136  --  136 133*  K 4.6 6.2* 5.6* 5.5*  --  4.5 4.1  CL 105 101  --  98*  --  100* 95*  CO2 26 24  --  24  --  27 30  GLUCOSE 48* 218*  --  228*  --  357* 316*  BUN 46* 61*  --  72*  --  73* 60*  CREATININE 1.71* 2.84*  --  3.22* 2.88* 2.57* 1.80*  CALCIUM 9.0 8.8*  --  8.8*  --  8.4* 8.5*  MG  --   --   --   --   --  1.5*  --     Liver Function Tests:  Recent Labs Lab 11/12/14 2345  AST 40  ALT 32  ALKPHOS 390*  BILITOT 1.4*  PROT 7.3  ALBUMIN 3.5    Recent Labs Lab 11/12/14 2345  LIPASE 17*   No results for input(s): AMMONIA in the last 168 hours.  CBC:  Recent Labs Lab 11/12/14 2212 11/15/14 2010 11/16/14 0421  WBC 8.7 7.8 8.3  NEUTROABS 6.5  --   --   HGB 10.3* 8.9* 8.4*  HCT 33.2* 28.4* 26.8*  MCV 68.6* 68.8* 68.0*  PLT 432* 302 274    Cardiac Enzymes:  Recent Labs Lab 11/12/14 2345 11/13/14 0230 11/13/14 0738 11/13/14 1431  TROPONINI 0.07* 0.07* 0.06* 0.07*    BNP: BNP (last 3 results)  Recent Labs  11/12/14 2212  BNP 455.8*    ProBNP (last 3 results) No results for input(s): PROBNP in the last 8760 hours.    Other results:  Imaging: No results found.   Medications:     Scheduled Medications: . antiseptic oral rinse  7 mL Mouth Rinse BID  . DULoxetine  60 mg Oral Daily  . enoxaparin (LOVENOX) injection  40 mg Subcutaneous Q24H  . furosemide  80 mg Intravenous BID  . hydrALAZINE  12.5 mg Oral 3 times per day  . insulin aspart  0-5 Units Subcutaneous QHS  . insulin  aspart  0-9 Units Subcutaneous TID WC  . insulin glargine  20 Units Subcutaneous BID  . isosorbide mononitrate  15 mg Oral Daily  . lipase/protease/amylase  3 capsule Oral BID AC  . nicotine  14 mg Transdermal Daily  . olopatadine  1 drop Both Eyes BID  . pantoprazole  40 mg Oral Daily  . pregabalin  150 mg Oral BID  . simvastatin  10 mg Oral q1800  . sodium chloride  3 mL Intravenous Q12H  . sodium chloride  3 mL Intravenous Q12H  . sodium chloride  3 mL Intravenous Q12H  . sodium polystyrene  15 g Oral Once  . cyanocobalamin  1,000 mcg Oral Daily    Infusions: . milrinone 0.25 mcg/kg/min (11/17/14 0315)    PRN Medications: sodium chloride, sodium chloride, sodium chloride, acetaminophen, benzonatate, guaiFENesin-dextromethorphan, ondansetron (ZOFRAN) IV, sodium chloride, sodium chloride, sodium chloride, tiZANidine, traMADol   Assessment:  1. Cardiogenic Shock 2. A/C Systolic Heart Failure  3. HTN 4. A/C CKD  5. DM II 6. Hypomagnesium  7. Hyperkalemia 8. H/O Pancreatitis 9. Limited mobility 10. Anemia, iron-deficiency   Plan/Discussion:   RHC with cardiogenic shock.Improved on milrinone  Increased cough. Lungs with rhonchi throughout. Need to continue to diurese.  Check CXR now. Afebrile. Check CBC.   Volume status and renal function improving.  Continue to diurese with IV lasix.  CO-OX this morning 61% on 0.25 mcg milrinone. Continue at current dose will not attempt to wean until fully diuresed.  No BB with low output. Increase hydralazine  25  mg tid + imdur  30  mg daily. No ace or spiro with ckd.   Anemia- Check iron stores. Check CBC now.   Glucose high. Adjust lantus today. Hgb A1C 7.4 on 11/13/2014   Length of Stay: 4 CLEGG,AMY NP-C 11/17/2014, 7:42 AM Advanced Heart Failure Team Pager [phone] (M-F; 7a - 4p)  Please contact [facility] for night-coverage after hours (4p -7a ) and weekends on [email or web address]  Patient seen and examined with [name], NP. We  discussed all aspects of the encounter. I agree with the assessment and plan as stated above.   Hemodynamics and renal function improving on milrinone but he remains markedly volume overload. Continue milrinone and IV lasix. Add metolazone. Increase hydralazine and nitrates. Probably pull swan tomorrow and place picc. CXR clear. Suspect cough  due to HF.   Will give feraheme for iron-def anemia. Increase lantus for CBGs.   The patient is critically ill with multiple organ systems failure and requires high complexity decision making for assessment and support, frequent evaluation and titration of therapies, application of advanced monitoring technologies and extensive interpretation of multiple databases.   Critical Care Time personally devoted to patient care services described in this note is 35 Minutes.  Webber Michiels,MD 1:24 PM

## 2014-11-18 DIAGNOSIS — N178 Other acute kidney failure: Secondary | ICD-10-CM

## 2014-11-18 LAB — BASIC METABOLIC PANEL
Anion gap: 7 (ref 5–15)
BUN: 42 mg/dL — ABNORMAL HIGH (ref 6–20)
CO2: 33 mmol/L — ABNORMAL HIGH (ref 22–32)
Calcium: 8.6 mg/dL — ABNORMAL LOW (ref 8.9–10.3)
Chloride: 94 mmol/L — ABNORMAL LOW (ref 101–111)
Creatinine, Ser: 1.35 mg/dL — ABNORMAL HIGH (ref 0.61–1.24)
GFR calc non Af Amer: 58 mL/min — ABNORMAL LOW (ref >60)
Glucose, Bld: 238 mg/dL — ABNORMAL HIGH (ref 65–99)
POTASSIUM: 3.8 mmol/L (ref 3.5–5.1)
SODIUM: 134 mmol/L — AB (ref 135–145)

## 2014-11-18 LAB — CARBOXYHEMOGLOBIN
CARBOXYHEMOGLOBIN: 2.2 % — AB (ref 0.5–1.5)
Methemoglobin: 1.1 % (ref 0.0–1.5)
O2 Saturation: 57 %
Total hemoglobin: 10.3 g/dL — ABNORMAL LOW (ref 13.5–18.0)

## 2014-11-18 LAB — MAGNESIUM: Magnesium: 1.6 mg/dL — ABNORMAL LOW (ref 1.7–2.4)

## 2014-11-18 LAB — GLUCOSE, CAPILLARY
GLUCOSE-CAPILLARY: 360 mg/dL — AB (ref 65–99)
GLUCOSE-CAPILLARY: 190 mg/dL — AB (ref 65–99)
GLUCOSE-CAPILLARY: 296 mg/dL — AB (ref 65–99)

## 2014-11-18 MED ORDER — SODIUM CHLORIDE 0.9 % IJ SOLN: 10.0000 mL | INTRAMUSCULAR | Status: DC | PRN

## 2014-11-18 MED ORDER — TORSEMIDE 20 MG PO TABS
80.0000 mg | ORAL_TABLET | Freq: Two times a day (BID) | ORAL | Status: DC
  Filled 2014-11-18 (×2): qty 4

## 2014-11-18 MED ORDER — HYDRALAZINE HCL 25 MG PO TABS
37.5000 mg | ORAL_TABLET | Freq: Three times a day (TID) | ORAL | Status: DC
  Administered 2014-11-18 – 2014-11-19 (×3): 37.5 mg via ORAL
  Filled 2014-11-18 (×6): qty 1.5

## 2014-11-18 MED ORDER — TORSEMIDE 20 MG PO TABS
40.0000 mg | ORAL_TABLET | Freq: Two times a day (BID) | ORAL | Status: DC
  Filled 2014-11-18 (×2): qty 2

## 2014-11-18 MED ORDER — SODIUM CHLORIDE 0.9 % IJ SOLN
10.0000 mL | Freq: Two times a day (BID) | INTRAMUSCULAR | Status: DC
  Administered 2014-11-18 – 2014-11-21 (×6): 10 mL

## 2014-11-18 MED ORDER — TORSEMIDE 20 MG PO TABS
40.0000 mg | ORAL_TABLET | Freq: Two times a day (BID) | ORAL | Status: DC
  Administered 2014-11-18 – 2014-11-19 (×3): 40 mg via ORAL
  Filled 2014-11-18 (×6): qty 2

## 2014-11-18 NOTE — Progress Notes (Signed)
Peripherally Inserted Central Catheter/Midline Placement  The IV Nurse has discussed with the patient and/or persons authorized to consent for the patient, the purpose of this procedure and the potential benefits and risks involved with this procedure.  The benefits include less needle sticks, lab draws from the catheter and patient may be discharged home with the catheter.  Risks include, but not limited to, infection, bleeding, blood clot (thrombus formation), and puncture of an artery; nerve damage and irregular heat beat.  Alternatives to this procedure were also discussed.  PICC/Midline Placement Documentation  PICC / Midline Double Lumen 11/18/14 PICC Right Basilic 44 cm 0 cm (Active)  Indication for Insertion or Continuance of Line Home intravenous therapies (PICC only) 11/18/2014  1:15 PM  Exposed Catheter (cm) 1 cm 11/18/2014  1:15 PM  Site Assessment Clean;Dry;Intact 11/18/2014  1:15 PM  Lumen #1 Status Flushed;Saline locked;Blood return noted 11/18/2014  1:15 PM  Lumen #2 Status Flushed;Saline locked;Blood return noted 11/18/2014  1:15 PM  Dressing Intervention New dressing 11/18/2014  1:15 PM  Dressing Change Due 11/25/14 11/18/2014  1:15 PM       Ethelda Chick 11/18/2014, 1:16 PM

## 2014-11-18 NOTE — Progress Notes (Signed)
Inpatient Diabetes Program Recommendations  AACE/ADA: New Consensus Statement on Inpatient Glycemic Control (2013)  Target Ranges:  Prepandial:   less than 140 mg/dL      Peak postprandial:   less than 180 mg/dL (1-2 hours)      Critically ill patients:  140 - 180 mg/dL   Results for KYNGSTEN, FRAIL (MRN [id number]) as of 11/18/2014 09:34  Ref. Range 11/17/2014 07:29 11/17/2014 11:20 11/17/2014 17:20 11/17/2014 22:21 11/18/2014 08:04  Glucose-Capillary Latest Ref Range: 65-99 mg/dL 671 (H) 245 (H) 809 (H) 360 (H) 190 (H)   Diabetes history: DM 2 Outpatient Diabetes medications: Lantus 50 units QAM, 20 units QPM, Actos 15 mg Daily, Metformin 850 mg BID Current orders for Inpatient glycemic control: Lantus 40 units daily, Lantus 30 units QHS, Novolog 0-9 units TID with meals, Novolog 0-5 units HS  Inpatient Diabetes Program Recommendations Insulin - Basal: Note Lantus was increased yesterday and patient will receive Lantus 40 units QAM and Lantus 30 units QHS. Fasting glucose 190 mg/dl this morning. Correction (SSI): Please consider increasing Novolog correction to moderate scale. Insulin - Meal Coverage: Please consider ordering Novolog 7 units TID with meals for meal coverage.  Thanks, Orlando Penner, RN, MSN, CCRN, CDE Diabetes Coordinator Inpatient Diabetes Program [phone] (Team Pager from 8am to 5pm) [phone] ([facility] office) [phone] [facility] office)

## 2014-11-18 NOTE — Progress Notes (Signed)
Advanced Heart Failure Rounding Note   Subjective:   54 yo male w/ hx nl EF 2015, remote pancreatitis, DM, ETOH abuse (quit > 5 year) admitted 05/09 w/ CHF, EF now 20-25%. Limited mobility uses crutches.   Transferred to [facility] from [facility] for HF input due to worsening renal function. Had RHC as noted below with cardiogenic shock, low output, and elevated filling pressures. Started on milrinone and diuresing with IV lasix. Renal function continues to improve. CO-OX 57%.  Events: - RHC 5/11 milrinone started - 5/13 received feraheme  Restless night. Diuresed well yesterday. Breathing better but still with PND. Down another 8 pounds. (14 pounds total) Developed transient LBBB last night now better. CBGs improved with increased insulin.   Swan Numbers done personally  RA     8 PAP  39/20 (23) PCWP 14 CO/CI  7.5/3.4  RHC 11/15/2014.  RA = 29 RV = 51/21/30 PA = 48/29 (38) PCW = 30 Fick cardiac output/index = 3.8/1.7 Thermo CO/CI = 3.7/1.7 PVR = 2.9 WU SVR = 1,502  Ao sat = 100% PA sat = 45%, 45% Ao = 131/86 (100)    Objective:   Weight Range:  Vital Signs:   Temp:  [98.2 F (36.8 C)-100.2 F (37.9 C)] 98.8 F (37.1 C) (05/14 1000) Pulse Rate:  [101-104] 101 (05/13 1200) Resp:  [7-26] 18 (05/14 1000) BP: (93-149)/(55-94) 130/90 mmHg (05/14 0700) SpO2:  [94 %-97 %] 95 % (05/14 0700) Weight:  [102.7 kg (226 lb 6.6 oz)] 102.7 kg (226 lb 6.6 oz) (05/14 0500) Last BM Date: 11/16/14  Weight change: Filed Weights   11/16/14 0417 11/17/14 0300 11/18/14 0500  Weight: 107.729 kg (237 lb 8 oz) 106.278 kg (234 lb 4.8 oz) 102.7 kg (226 lb 6.6 oz)    Intake/Output:   Intake/Output Summary (Last 24 hours) at 11/18/14 1046 Last data filed at 11/18/14 1000  Gross per 24 hour  Intake 1918.8 ml  Output   4275 ml  Net -2356.2 ml     Physical Exam:  General:  Sitting in chair. No resp difficulty HEENT: normal Neck: supple. JVP to jaw. RIJ swan Carotids 2+ bilat; no bruits. No  lymphadenopathy or thryomegaly appreciated. Cor: PMI laterally displaced. Tachy regular. 2/6 MR. + S3  Lungs: clear Abdomen: soft, nontender, nondistended. No hepatosplenomegaly. No bruits or masses. Good bowel sounds. Extremities: no cyanosis, clubbing, rash,  RLE and LLE 2+ edema  Neuro: alert & orientedx3, cranial nerves grossly intact. moves all 4 extremities w/o difficulty. Affect pleasant  Telemetry: NSR 90-105   Labs: Basic Metabolic Panel:  Recent Labs Lab 11/14/14 0515 11/14/14 1823 11/15/14 0315 11/15/14 2010 11/16/14 0425 11/17/14 0305 11/18/14 0522  NA 135  --  136  --  136 133* 134*  K 6.2* 5.6* 5.5*  --  4.5 4.1 3.8  CL 101  --  98*  --  100* 95* 94*  CO2 24  --  24  --  27 30 33*  GLUCOSE 218*  --  228*  --  357* 316* 238*  BUN 61*  --  72*  --  73* 60* 42*  CREATININE 2.84*  --  3.22* 2.88* 2.57* 1.80* 1.35*  CALCIUM 8.8*  --  8.8*  --  8.4* 8.5* 8.6*  MG  --   --   --   --  1.5*  --  1.6*    Liver Function Tests:  Recent Labs Lab 11/12/14 2345  AST 40  ALT 32  ALKPHOS 390*  BILITOT  1.4*  PROT 7.3  ALBUMIN 3.5    Recent Labs Lab 11/12/14 2345  LIPASE 17*   No results for input(s): AMMONIA in the last 168 hours.  CBC:  Recent Labs Lab 11/12/14 2212 11/15/14 2010 11/16/14 0421 11/17/14 0830  WBC 8.7 7.8 8.3 11.4*  NEUTROABS 6.5  --   --   --   HGB 10.3* 8.9* 8.4* 8.8*  HCT 33.2* 28.4* 26.8* 28.8*  MCV 68.6* 68.8* 68.0* 68.4*  PLT 432* 302 274 132*    Cardiac Enzymes:  Recent Labs Lab 11/12/14 2345 11/13/14 0230 11/13/14 0738 11/13/14 1431  TROPONINI 0.07* 0.07* 0.06* 0.07*    BNP: BNP (last 3 results)  Recent Labs  11/12/14 2212  BNP 455.8*    ProBNP (last 3 results) No results for input(s): PROBNP in the last 8760 hours.    Other results:  Imaging: Dg Chest Port 1 View  11/17/2014   CLINICAL DATA:  Chest congestion, smoker  EXAM: PORTABLE CHEST - 1 VIEW  COMPARISON:  11/12/2014  FINDINGS: Cardiomegaly.  There is right IJ Swan-Ganz catheter with tip in right lower lobe pulmonary artery. No pneumothorax. Stable right base medially atelectasis or scarring. No convincing pulmonary edema.  IMPRESSION: There is right IJ Swan-Ganz catheter with tip in right lower lobe pulmonary artery. No pneumothorax. Stable right base medially atelectasis or scarring. No convincing pulmonary edema.   Electronically Signed   By: Natasha Mead M.D.   On: 11/17/2014 08:23     Medications:     Scheduled Medications: . antiseptic oral rinse  7 mL Mouth Rinse BID  . DULoxetine  60 mg Oral Daily  . enoxaparin (LOVENOX) injection  50 mg Subcutaneous Q24H  . ferumoxytol  510 mg Intravenous Weekly  . furosemide  80 mg Intravenous BID  . hydrALAZINE  25 mg Oral 3 times per day  . insulin aspart  0-5 Units Subcutaneous QHS  . insulin aspart  0-9 Units Subcutaneous TID WC  . insulin glargine  30 Units Subcutaneous QHS  . insulin glargine  40 Units Subcutaneous Daily  . isosorbide mononitrate  30 mg Oral Daily  . lipase/protease/amylase  3 capsule Oral BID AC  . metolazone  5 mg Oral BID  . nicotine  14 mg Transdermal Daily  . olopatadine  1 drop Both Eyes BID  . pantoprazole  40 mg Oral Daily  . pregabalin  150 mg Oral BID  . simvastatin  10 mg Oral q1800  . sodium chloride  3 mL Intravenous Q12H  . sodium chloride  3 mL Intravenous Q12H  . sodium chloride  3 mL Intravenous Q12H  . sodium polystyrene  15 g Oral Once  . cyanocobalamin  1,000 mcg Oral Daily    Infusions: . milrinone 0.25 mcg/kg/min (11/18/14 0800)    PRN Medications: sodium chloride, sodium chloride, sodium chloride, acetaminophen, albuterol, benzonatate, guaiFENesin-dextromethorphan, ondansetron (ZOFRAN) IV, sodium chloride, sodium chloride, sodium chloride, tiZANidine, traMADol   Assessment:   1. Cardiogenic Shock 2. A/C Systolic Heart Failure  3. HTN 4. 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 DM II 6. Hypomagnesium  7.  Hyperkalemia 8. H/O Pancreatitis 9. Limited mobility 10. Anemia, iron-deficiency   Plan/Discussion:    RHC with cardiogenic shock. Improved on milrinone  Swan numbers now normal but still with LE edema and PND. Thermodilution CO seems high in proportion to co-ox.   Will pull Swan today. Place PICC. Switch IV lasix to po torsemide. Place TED hose. Continue to titrate hydralazine  and imdur. Will follow co-ox and try to wean milrinone slowly as tolerated. However, co-ox 57% makes me think it will be tough.  No BB with low output. Can consider ACE/ARB as renal function continues to improve.   CBGs improved after insulin adjusted. Hgb A1C 7.4 on 11/13/2014   The patient is critically ill with multiple organ systems failure and requires high complexity decision making for assessment and support, frequent evaluation and titration of therapies, application of advanced monitoring technologies and extensive interpretation of multiple databases.   Critical Care Time devoted to patient care services described in this note is 35 Minutes.   Length of Stay: 5 Bensimhon, Daniel MD 11/18/2014, 10:46 AM Advanced Heart Failure Team Pager [phone] (M-F; 7a - 4p)  Please contact [facility] for night-coverage after hours (4p -7a ) and weekends on [email or web address]

## 2014-11-19 LAB — BASIC METABOLIC PANEL
ANION GAP: 10 (ref 5–15)
BUN: 35 mg/dL — AB (ref 6–20)
CALCIUM: 8.9 mg/dL (ref 8.9–10.3)
CHLORIDE: 92 mmol/L — AB (ref 101–111)
CO2: 37 mmol/L — AB (ref 22–32)
Creatinine, Ser: 1.29 mg/dL — ABNORMAL HIGH (ref 0.61–1.24)
GFR calc Af Amer: >60 mL/min (ref >60)
GFR calc non Af Amer: >60 mL/min (ref >60)
GLUCOSE: 145 mg/dL — AB (ref 65–99)
Potassium: 3.7 mmol/L (ref 3.5–5.1)
Sodium: 139 mmol/L (ref 135–145)

## 2014-11-19 LAB — GLUCOSE, CAPILLARY
GLUCOSE-CAPILLARY: 254 mg/dL — AB (ref 65–99)
GLUCOSE-CAPILLARY: 78 mg/dL (ref 65–99)
Glucose-Capillary: 190 mg/dL — ABNORMAL HIGH (ref 65–99)
Glucose-Capillary: 140 mg/dL — ABNORMAL HIGH (ref 65–99)
Glucose-Capillary: 246 mg/dL — ABNORMAL HIGH (ref 65–99)

## 2014-11-19 LAB — CBC
HCT: 28.2 % — ABNORMAL LOW (ref 39.0–52.0)
Hemoglobin: 8.8 g/dL — ABNORMAL LOW (ref 13.0–17.0)
MCH: 21.2 pg — AB (ref 26.0–34.0)
MCHC: 31.2 g/dL (ref 30.0–36.0)
MCV: 67.8 fL — ABNORMAL LOW (ref 78.0–100.0)
PLATELETS: 183 10*3/uL (ref 150–400)
RBC: 4.16 MIL/uL — ABNORMAL LOW (ref 4.22–5.81)
RDW: 18.5 % — AB (ref 11.5–15.5)
WBC: 12.5 10*3/uL — AB (ref 4.0–10.5)

## 2014-11-19 LAB — CARBOXYHEMOGLOBIN
CARBOXYHEMOGLOBIN: 2.5 % — AB (ref 0.5–1.5)
METHEMOGLOBIN: 1.1 % (ref 0.0–1.5)
O2 Saturation: 69.1 %
Total hemoglobin: 9 g/dL — ABNORMAL LOW (ref 13.5–18.0)

## 2014-11-19 MED ORDER — SODIUM CHLORIDE 0.9 % IV SOLN
INTRAVENOUS | Status: DC
  Administered 2014-11-19: 250 mL via INTRAVENOUS

## 2014-11-19 MED ORDER — POTASSIUM CHLORIDE ER 10 MEQ PO TBCR
20.0000 meq | EXTENDED_RELEASE_TABLET | Freq: Two times a day (BID) | ORAL | Status: DC
  Administered 2014-11-19 (×2): 20 meq via ORAL
  Filled 2014-11-19 (×4): qty 2

## 2014-11-19 MED ORDER — FERUMOXYTOL INJECTION 510 MG/17 ML
510.0000 mg | INTRAVENOUS | Status: DC
  Filled 2014-11-19: qty 17

## 2014-11-19 MED ORDER — HYDRALAZINE HCL 50 MG PO TABS
50.0000 mg | ORAL_TABLET | Freq: Three times a day (TID) | ORAL | Status: DC
  Administered 2014-11-19 – 2014-11-20 (×3): 50 mg via ORAL
  Filled 2014-11-19 (×6): qty 1

## 2014-11-19 MED ORDER — ISOSORBIDE MONONITRATE ER 30 MG PO TB24
30.0000 mg | ORAL_TABLET | Freq: Two times a day (BID) | ORAL | Status: DC
  Administered 2014-11-19 – 2014-11-21 (×5): 30 mg via ORAL
  Filled 2014-11-19 (×7): qty 1

## 2014-11-19 NOTE — Progress Notes (Signed)
Advanced Heart Failure Rounding Note   Subjective:   54 yo male w/ hx nl EF 2015, remote pancreatitis, DM, ETOH abuse (quit > 5 year) admitted 05/09 w/ CHF, EF now 20-25%. Limited mobility uses crutches.   Transferred to [facility] from [facility] for HF input due to worsening renal function. Had RHC as noted below with cardiogenic shock, low output, and elevated filling pressures. Started on milrinone and IV lasix.   Events: - RHC 5/11 milrinone started - 5/13 received feraheme  Swan pulled yesterday. PICC placed. Switched to po torsemide. Hydralazine increased. Weight down another 9 pounds. Feels better. No further orthopnea. Co-ox 69%. Creatinine down to 1.29. Tsat ratio 6%.   RHC 11/15/2014.  RA = 29 RV = 51/21/30 PA = 48/29 (38) PCW = 30 Fick cardiac output/index = 3.8/1.7 Thermo CO/CI = 3.7/1.7 PVR = 2.9 WU SVR = 1,502  Ao sat = 100% PA sat = 45%, 45% Ao = 131/86 (100)    Objective:   Weight Range:  Vital Signs:   Temp:  [98.2 F (36.8 C)-99.7 F (37.6 C)] 98.2 F (36.8 C) (05/15 0727) Resp:  [13-25] 18 (05/15 0727) BP: (101-145)/(48-79) 124/61 mmHg (05/15 0727) SpO2:  [90 %-96 %] 94 % (05/15 0727) Weight:  [98.8 kg (217 lb 13 oz)] 98.8 kg (217 lb 13 oz) (05/15 0403) Last BM Date: 11/18/14  Weight change: Filed Weights   11/17/14 0300 11/18/14 0500 11/19/14 0403  Weight: 106.278 kg (234 lb 4.8 oz) 102.7 kg (226 lb 6.6 oz) 98.8 kg (217 lb 13 oz)    Intake/Output:   Intake/Output Summary (Last 24 hours) at 11/19/14 0827 Last data filed at 11/19/14 0600  Gross per 24 hour  Intake    864 ml  Output   4750 ml  Net  -3886 ml     Physical Exam:  General:  Sitting in chair. No resp difficulty HEENT: normal Neck: supple. JVP 10Carotids 2+ bilat; no bruits. No lymphadenopathy or thryomegaly appreciated. Cor: PMI laterally displaced. Tachy regular. 2/6 MR. + S3  Lungs: clear Abdomen: soft, nontender, nondistended. No hepatosplenomegaly. No bruits or masses. Good  bowel sounds. Extremities: no cyanosis, clubbing, rash,  RLE and LLE 1-2+ edema  Neuro: alert & orientedx3, cranial nerves grossly intact. moves all 4 extremities w/o difficulty. Affect pleasant  Telemetry: NSR 100-110  Labs: Basic Metabolic Panel:  Recent Labs Lab 11/15/14 0315 11/15/14 2010 11/16/14 0425 11/17/14 0305 11/18/14 0522 11/19/14 0417  NA 136  --  136 133* 134* 139  K 5.5*  --  4.5 4.1 3.8 3.7  CL 98*  --  100* 95* 94* 92*  CO2 24  --  27 30 33* 37*  GLUCOSE 228*  --  357* 316* 238* 145*  BUN 72*  --  73* 60* 42* 35*  CREATININE 3.22* 2.88* 2.57* 1.80* 1.35* 1.29*  CALCIUM 8.8*  --  8.4* 8.5* 8.6* 8.9  MG  --   --  1.5*  --  1.6*  --     Liver Function Tests:  Recent Labs Lab 11/12/14 2345  AST 40  ALT 32  ALKPHOS 390*  BILITOT 1.4*  PROT 7.3  ALBUMIN 3.5    Recent Labs Lab 11/12/14 2345  LIPASE 17*   No results for input(s): AMMONIA in the last 168 hours.  CBC:  Recent Labs Lab 11/12/14 2212 11/15/14 2010 11/16/14 0421 11/17/14 0830 11/19/14 0417  WBC 8.7 7.8 8.3 11.4* 12.5*  NEUTROABS 6.5  --   --   --   --  HGB 10.3* 8.9* 8.4* 8.8* 8.8*  HCT 33.2* 28.4* 26.8* 28.8* 28.2*  MCV 68.6* 68.8* 68.0* 68.4* 67.8*  PLT 432* 302 274 132* 183    Cardiac Enzymes:  Recent Labs Lab 11/12/14 2345 11/13/14 0230 11/13/14 0738 11/13/14 1431  TROPONINI 0.07* 0.07* 0.06* 0.07*    BNP: BNP (last 3 results)  Recent Labs  11/12/14 2212  BNP 455.8*    ProBNP (last 3 results) No results for input(s): PROBNP in the last 8760 hours.    Other results:  Imaging: No results found.   Medications:     Scheduled Medications: . antiseptic oral rinse  7 mL Mouth Rinse BID  . DULoxetine  60 mg Oral Daily  . enoxaparin (LOVENOX) injection  50 mg Subcutaneous Q24H  . ferumoxytol  510 mg Intravenous Weekly  . hydrALAZINE  37.5 mg Oral 3 times per day  . insulin aspart  0-5 Units Subcutaneous QHS  . insulin aspart  0-9 Units  Subcutaneous TID WC  . insulin glargine  30 Units Subcutaneous QHS  . insulin glargine  40 Units Subcutaneous Daily  . isosorbide mononitrate  30 mg Oral Daily  . lipase/protease/amylase  3 capsule Oral BID AC  . nicotine  14 mg Transdermal Daily  . olopatadine  1 drop Both Eyes BID  . pantoprazole  40 mg Oral Daily  . pregabalin  150 mg Oral BID  . simvastatin  10 mg Oral q1800  . sodium chloride  10-40 mL Intracatheter Q12H  . sodium chloride  3 mL Intravenous Q12H  . sodium chloride  3 mL Intravenous Q12H  . sodium chloride  3 mL Intravenous Q12H  . sodium polystyrene  15 g Oral Once  . torsemide  40 mg Oral BID  . cyanocobalamin  1,000 mcg Oral Daily    Infusions: . milrinone 0.25 mcg/kg/min (11/19/14 0049)    PRN Medications: sodium chloride, sodium chloride, sodium chloride, acetaminophen, albuterol, benzonatate, guaiFENesin-dextromethorphan, ondansetron (ZOFRAN) IV, sodium chloride, sodium chloride, sodium chloride, sodium chloride, tiZANidine, traMADol   Assessment:   1. Cardiogenic Shock 2. A/C Systolic Heart Failure  3. HTN 4. 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 DM II 6. Hypomagnesium  7. Hyperkalemia 8. H/O Pancreatitis 9. Limited mobility 10. Anemia, iron-deficiency   Plan/Discussion:    RHC with cardiogenic shock. Improving on milrinone though remains tachy. Co-ox continues to increase. Weight coming down and renal function now at baseline.  On po torsemide now. Will measure CVPs.   Continue to titrate hydralazine and imdur. Will follow co-ox and try to wean milrinone slowly as tolerated (go to 0.125 today) - though I suspect may need home inotropes. No BB with low output. Can consider ACE/ARB as renal function continues to improve.   CBGs improved after insulin adjusted. Hgb A1C 7.4 on 11/13/2014   Continue Feraheme x 2 doses.      Length of Stay: 6 Arvilla Meres MD 11/19/2014, 8:27 AM Advanced Heart Failure Team Pager [phone]  (M-F; 7a - 4p)  Please contact [facility] for night-coverage after hours (4p -7a ) and weekends on [email or web address]

## 2014-11-20 DIAGNOSIS — Z515 Encounter for palliative care: Secondary | ICD-10-CM

## 2014-11-20 DIAGNOSIS — R531 Weakness: Secondary | ICD-10-CM

## 2014-11-20 LAB — CBC
HCT: 29.5 % — ABNORMAL LOW (ref 39.0–52.0)
Hemoglobin: 9.3 g/dL — ABNORMAL LOW (ref 13.0–17.0)
MCH: 21.3 pg — AB (ref 26.0–34.0)
MCHC: 31.5 g/dL (ref 30.0–36.0)
MCV: 67.5 fL — ABNORMAL LOW (ref 78.0–100.0)
PLATELETS: 207 10*3/uL (ref 150–400)
RBC: 4.37 MIL/uL (ref 4.22–5.81)
RDW: 19 % — ABNORMAL HIGH (ref 11.5–15.5)
WBC: 12.4 10*3/uL — ABNORMAL HIGH (ref 4.0–10.5)

## 2014-11-20 LAB — GLUCOSE, CAPILLARY
GLUCOSE-CAPILLARY: 210 mg/dL — AB (ref 65–99)
GLUCOSE-CAPILLARY: 125 mg/dL — AB (ref 65–99)
Glucose-Capillary: 284 mg/dL — ABNORMAL HIGH (ref 65–99)
Glucose-Capillary: 241 mg/dL — ABNORMAL HIGH (ref 65–99)
Glucose-Capillary: 344 mg/dL — ABNORMAL HIGH (ref 65–99)

## 2014-11-20 LAB — BASIC METABOLIC PANEL
Anion gap: 10 (ref 5–15)
BUN: 29 mg/dL — ABNORMAL HIGH (ref 6–20)
CALCIUM: 9.2 mg/dL (ref 8.9–10.3)
CO2: 37 mmol/L — ABNORMAL HIGH (ref 22–32)
CREATININE: 1.44 mg/dL — AB (ref 0.61–1.24)
Chloride: 90 mmol/L — ABNORMAL LOW (ref 101–111)
GFR calc Af Amer: >60 mL/min (ref >60)
GFR calc non Af Amer: 54 mL/min — ABNORMAL LOW (ref >60)
GLUCOSE: 170 mg/dL — AB (ref 65–99)
Potassium: 3.6 mmol/L (ref 3.5–5.1)
Sodium: 137 mmol/L (ref 135–145)

## 2014-11-20 LAB — CARBOXYHEMOGLOBIN
Carboxyhemoglobin: 2.4 % — ABNORMAL HIGH (ref 0.5–1.5)
Carboxyhemoglobin: 2.2 % — ABNORMAL HIGH (ref 0.5–1.5)
METHEMOGLOBIN: 0.9 % (ref 0.0–1.5)
METHEMOGLOBIN: 0.8 % (ref 0.0–1.5)
O2 SAT: 46 %
O2 Saturation: 51.2 %
Total hemoglobin: 9.6 g/dL — ABNORMAL LOW (ref 13.5–18.0)
Total hemoglobin: 9.8 g/dL — ABNORMAL LOW (ref 13.5–18.0)

## 2014-11-20 MED ORDER — FUROSEMIDE 10 MG/ML IJ SOLN: 80.0000 mg | Freq: Two times a day (BID) | INTRAMUSCULAR | Status: DC

## 2014-11-20 MED ORDER — ENOXAPARIN SODIUM 40 MG/0.4ML ~~LOC~~ SOLN
40.0000 mg | SUBCUTANEOUS | Status: DC
  Administered 2014-11-20 – 2014-11-21 (×2): 40 mg via SUBCUTANEOUS
  Filled 2014-11-20 (×2): qty 0.4

## 2014-11-20 MED ORDER — SPIRONOLACTONE 12.5 MG HALF TABLET
12.5000 mg | ORAL_TABLET | Freq: Every day | ORAL | Status: DC
  Administered 2014-11-20 – 2014-11-21 (×2): 12.5 mg via ORAL
  Filled 2014-11-20 (×3): qty 1

## 2014-11-20 MED ORDER — POTASSIUM CHLORIDE ER 10 MEQ PO TBCR
40.0000 meq | EXTENDED_RELEASE_TABLET | Freq: Two times a day (BID) | ORAL | Status: DC
  Administered 2014-11-20: 40 meq via ORAL
  Filled 2014-11-20 (×4): qty 4

## 2014-11-20 MED ORDER — TORSEMIDE 20 MG PO TABS
20.0000 mg | ORAL_TABLET | Freq: Two times a day (BID) | ORAL | Status: DC
  Administered 2014-11-20 – 2014-11-21 (×2): 20 mg via ORAL
  Filled 2014-11-20 (×4): qty 1

## 2014-11-20 MED ORDER — POTASSIUM CHLORIDE ER 10 MEQ PO TBCR
20.0000 meq | EXTENDED_RELEASE_TABLET | Freq: Two times a day (BID) | ORAL | Status: DC
  Administered 2014-11-20 – 2014-11-21 (×2): 20 meq via ORAL
  Filled 2014-11-20 (×4): qty 2

## 2014-11-20 MED ORDER — MILRINONE IN DEXTROSE 20 MG/100ML IV SOLN
0.2500 ug/kg/min | INTRAVENOUS | Status: DC
  Administered 2014-11-20 (×2): 0.25 ug/kg/min via INTRAVENOUS
  Filled 2014-11-20 (×3): qty 100

## 2014-11-20 MED ORDER — TORSEMIDE 20 MG PO TABS
20.0000 mg | ORAL_TABLET | Freq: Two times a day (BID) | ORAL | Status: DC
  Administered 2014-11-20: 20 mg via ORAL
  Filled 2014-11-20 (×3): qty 1

## 2014-11-20 MED ORDER — HYDRALAZINE HCL 50 MG PO TABS
75.0000 mg | ORAL_TABLET | Freq: Three times a day (TID) | ORAL | Status: DC
  Administered 2014-11-20 – 2014-11-21 (×4): 75 mg via ORAL
  Filled 2014-11-20 (×6): qty 1

## 2014-11-20 MED ORDER — ZOLPIDEM TARTRATE 5 MG PO TABS
5.0000 mg | ORAL_TABLET | Freq: Every evening | ORAL | Status: DC | PRN
  Administered 2014-11-20: 5 mg via ORAL
  Filled 2014-11-20: qty 1

## 2014-11-20 NOTE — Progress Notes (Signed)
[facility]  Patient Status:   New patient for [facility] this admission  [facility] is providing the following services: [facility]N and [facility] for home Milrinone upon DC to home.  [facility] hospital team will follow pt and support transition home when ordered.   If patient discharges after hours, please call [phone].   Christian Wall 11/20/2014, 11:54 AM

## 2014-11-20 NOTE — Progress Notes (Signed)
Advanced Heart Failure Rounding Note   Subjective:   54 yo male w/ hx nl EF 2015, remote pancreatitis, DM, ETOH abuse (quit > 5 year) admitted 05/09 w/ CHF, EF now 20-25%. Limited mobility uses crutches.   Transferred to [facility] from [facility] for HF input due to worsening renal function. Had RHC as noted below with cardiogenic shock, low output, and elevated filling pressures. Started on milrinone and IV lasix.   Events: - RHC 5/11 milrinone started - 5/13 received feraheme  PICC placed. Yesterday milrinone dropped to 0.125 mcg. CO-OX 51% this am. Brisk diuresis.   Denies SOB.    Creatinine down to 1.29>1.44.   RHC 11/15/2014.  RA = 29 RV = 51/21/30 PA = 48/29 (38) PCW = 30 Fick cardiac output/index = 3.8/1.7 Thermo CO/CI = 3.7/1.7 PVR = 2.9 WU SVR = 1,502  Ao sat = 100% PA sat = 45%, 45% Ao = 131/86 (100)    Objective:   Weight Range:  Vital Signs:   Temp:  [98.1 F (36.7 C)-99.5 F (37.5 C)] 99.5 F (37.5 C) (05/16 0400) Resp:  [18-25] 25 (05/15 2249) BP: (101-138)/(61-74) 125/65 mmHg (05/16 0400) SpO2:  [94 %-98 %] 97 % (05/16 0400) Weight:  [207 lb 14.3 oz (94.3 kg)] 207 lb 14.3 oz (94.3 kg) (05/16 0400) Last BM Date: 11/18/14  Weight change: Filed Weights   11/18/14 0500 11/19/14 0403 11/20/14 0400  Weight: 226 lb 6.6 oz (102.7 kg) 217 lb 13 oz (98.8 kg) 207 lb 14.3 oz (94.3 kg)    Intake/Output:   Intake/Output Summary (Last 24 hours) at 11/20/14 0715 Last data filed at 11/20/14 0600  Gross per 24 hour  Intake 532.93 ml  Output   5290 ml  Net -4757.07 ml     Physical Exam: CVP 5-6 General:  Sitting in chair. No resp difficulty HEENT: normal Neck: supple. JVP flat. Carotids 2+ bilat; no bruits. No lymphadenopathy or thryomegaly appreciated. Cor: PMI laterally displaced. Tachy regular. 2/6 MR. + S3  Lungs: clear Abdomen: soft, nontender, nondistended. No hepatosplenomegaly. No bruits or masses. Good bowel sounds. Extremities: no cyanosis,  clubbing, rash,  RLE and LLE no edema.   Neuro: alert & orientedx3, cranial nerves grossly intact. moves all 4 extremities w/o difficulty. Affect pleasant  Telemetry: NSR 100-110  Labs: Basic Metabolic Panel:  Recent Labs Lab 11/16/14 0425 11/17/14 0305 11/18/14 0522 11/19/14 0417 11/20/14 0430  NA 136 133* 134* 139 137  K 4.5 4.1 3.8 3.7 3.6  CL 100* 95* 94* 92* 90*  CO2 27 30 33* 37* 37*  GLUCOSE 357* 316* 238* 145* 170*  BUN 73* 60* 42* 35* 29*  CREATININE 2.57* 1.80* 1.35* 1.29* 1.44*  CALCIUM 8.4* 8.5* 8.6* 8.9 9.2  MG 1.5*  --  1.6*  --   --     Liver Function Tests: No results for input(s): AST, ALT, ALKPHOS, BILITOT, PROT, ALBUMIN in the last 168 hours. No results for input(s): LIPASE, AMYLASE in the last 168 hours. No results for input(s): AMMONIA in the last 168 hours.  CBC:  Recent Labs Lab 11/15/14 2010 11/16/14 0421 11/17/14 0830 11/19/14 0417 11/20/14 0430  WBC 7.8 8.3 11.4* 12.5* 12.4*  HGB 8.9* 8.4* 8.8* 8.8* 9.3*  HCT 28.4* 26.8* 28.8* 28.2* 29.5*  MCV 68.8* 68.0* 68.4* 67.8* 67.5*  PLT 302 274 132* 183 207    Cardiac Enzymes:  Recent Labs Lab 11/13/14 0738 11/13/14 1431  TROPONINI 0.06* 0.07*    BNP: BNP (last 3  results)  Recent Labs  11/12/14 2212  BNP 455.8*    ProBNP (last 3 results) No results for input(s): PROBNP in the last 8760 hours.    Other results:  Imaging: No results found.   Medications:     Scheduled Medications: . antiseptic oral rinse  7 mL Mouth Rinse BID  . DULoxetine  60 mg Oral Daily  . enoxaparin (LOVENOX) injection  50 mg Subcutaneous Q24H  . ferumoxytol  510 mg Intravenous Weekly  . hydrALAZINE  50 mg Oral 3 times per day  . insulin aspart  0-5 Units Subcutaneous QHS  . insulin aspart  0-9 Units Subcutaneous TID WC  . insulin glargine  30 Units Subcutaneous QHS  . insulin glargine  40 Units Subcutaneous Daily  . isosorbide mononitrate  30 mg Oral BID  . lipase/protease/amylase  3  capsule Oral BID AC  . nicotine  14 mg Transdermal Daily  . olopatadine  1 drop Both Eyes BID  . pantoprazole  40 mg Oral Daily  . potassium chloride  20 mEq Oral BID  . pregabalin  150 mg Oral BID  . simvastatin  10 mg Oral q1800  . sodium chloride  10-40 mL Intracatheter Q12H  . sodium polystyrene  15 g Oral Once  . torsemide  40 mg Oral BID  . cyanocobalamin  1,000 mcg Oral Daily    Infusions: . sodium chloride 250 mL (11/19/14 2248)  . milrinone 0.125 mcg/kg/min (11/19/14 2000)    PRN Medications: acetaminophen, albuterol, benzonatate, guaiFENesin-dextromethorphan, ondansetron (ZOFRAN) IV, sodium chloride, tiZANidine, traMADol   Assessment:   1. Cardiogenic Shock 2. A/C Systolic Heart Failure  3. HTN 4. 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 DM II 6. Hypomagnesium  7. Hyperkalemia 8. H/O Pancreatitis 9. Limited mobility 10. Anemia, iron-deficiency   Plan/Discussion:    RHC with cardiogenic shock. Improving on milrinone though remains tachy.   Yesterday milrinone weaned to 0.125 mcg however CO-OX down to 51%. Will need to increase back to 0.25 mcg. Will need to go home on milrinone. Brisk diuresis noted.  CVP 5-6.Cut back torsemide to 20 mg twice daily.  Increase hydralazine to 75 mg tid and continue imdur 30 mg twice a day. No BB with low output. Can consider ACE/ARB as renal function continues to improve. May need to consider advanced therapies.   CBGs improved after insulin adjusted. Hgb A1C 7.4 on 11/13/2014   Continue Feraheme x 2 doses.   Consult palliative care for goals of care. Consult case management for home milrinone.    Length of Stay: 7 CLEGG,AMY NP-C  11/20/2014, 7:15 AM Advanced Heart Failure Team Pager [phone] (M-F; 7a - 4p)  Please contact [facility] for night-coverage after hours (4p -7a ) and weekends on [email or web address]  Patient seen with NP, agree with the above note.  Weight continues to fall, CVP 5-6.  Co-ox lower this morning  at 0.125 milrinone. Creatinine mildly higher but much better than initial.  - Torsemide to 20 mg bid.  - Continue hydralazine/Imdur - Add spironolactone 12.5 daily - Hold on ACEI for now, probably can add low dose soon.  - continue milrinone 0.125 for now, send co-ox again and if < 60% will increase back to 0.25.  - Home probably in the next couple days, most likely on milrinone gtt.  Will need ischemic evaluation, likely coronary angiography as long as creatinine remains stable.   Marca Ancona 11/20/2014 7:51 AM

## 2014-11-20 NOTE — Consult Note (Signed)
Consultation Note Date: 11/20/2014   Patient Name: Christian [place]  DOB: 03-06-1961  MRN: [id number]  Age / Sex: 54 y.o., male   PCP: Christian Ebbs, MD Referring Physician: Jolaine Artist, MD  Reason for Consultation: Establishing goals of care  Palliative Care Assessment and Plan Summary of Established Goals of Care and Medical Treatment Preferences    Palliative Care Discussion Held Today Contacts/Participants in Discussion: Primary Decision Maker: [place] and then his wife    I met today with Christian Wall and his wife at bedside. Very pleasant and lovely couple. Christian [place] tells me that he feels much better now as compared to when he was first hospitalized. He tells me that he is very optimistic with the regimen that he is on and he tells me that he has been near death in his past and has come out on top so he is not concerned about his heart failure. He is very motivated to change his diet and follow a plan to keep him as healthy as possible. I gently explained that we hope for the best but sometimes we have to plan for the worst and encouraged him and his wife to discuss their wishes and healthcare desires with each other. He acknowledges that he has not given much thought to this before. He also says that he knows we all die but he has "unfinished business" and has personal goals that he needs to achieve (he did not share these goals with me). At this time their hope is to return home and optimize his health as he enjoys being at home working in his office, sitting outside, and some light gardening. He also alludes to the fact that he had 9 siblings and there are only 2 others living and he hopes to outlive them all. I will continue to follow while hospitalized.    Code Status/Advance Care Planning:  FULL   Symptom Management:   Dyspnea/SOB/edema: Continue management by heart failure team. Anticipate home on milrinone which has seemed to greatly improve.   Weakness: He is anxious  to start walking and testing his limits so he will be better prepared for home. Consider therapy consult.   Psycho-social/Spiritual:   Support System: Wife is at bedside and she works part-time. They have no children and 1 dog - a dobson. [place] to be nearer her family. He used to be a Biomedical scientist and still enjoys cooking.   Prognosis: Unable to determine  Discharge Planning:  Home with Home Health       Chief Complaint/History of Present Illness: 54 yo male w/ hx nl EF 2015, remote pancreatitis, DM, ETOH abuse (quit > 5 year) admitted 05/09 w/ CHF, EF now 20-25%. Limited mobility uses crutches. Transfer[place] for HF input due to worsening renal function. Had [place] as noted below with cardiogenic shock, low output, and elevated filling pressures. Started on milrinone and IV lasix. Preparing to return home with IV milrinone. Continuing to monitor renal function.   Primary Diagnoses  Present on Admission:  . Acute systolic CHF (congestive heart failure), NYHA class 3 . Essential hypertension . Hypoglycemia . Chronic renal insufficiency . Diabetes mellitus type II, uncontrolled  Palliative Review of Systems: + edema, + shortness of breath, fatigue all improved. Denies pain, constipation. Moderate sleep disturbance but anticipating this will improve at home.   I have reviewed the medical record, interviewed the patient and family, and examined the patient. The following aspects  are pertinent.  Past Medical History  Diagnosis Date  . Diabetes mellitus   . Atrophy of calf muscles   . Depression   . GERD (gastroesophageal reflux disease)   . Peripheral neuropathy   . Hypercholesteremia   . Hx of tracheostomy    History   Social History  . Marital Status: Married    Spouse Name: N/A  . Number of Children: N/A  . Years of Education: N/A   Social History Main Topics  . Smoking status: Current Every Day Smoker -- 1.00 packs/day for 30 years     Types: Cigarettes  . Smokeless tobacco: Never Used  . Alcohol Use: No     Comment: Quit after his brother's death in 09-28-05 or 8   . Drug Use: No  . Sexual Activity: Yes   Other Topics Concern  . None   Social History Narrative   Family History  Problem Relation Age of Onset  . Diabetes Mellitus II Sister   . CAD Brother   . Cancer Mother   . Cancer Father    Scheduled Meds: . antiseptic oral rinse  7 mL Mouth Rinse BID  . DULoxetine  60 mg Oral Daily  . enoxaparin (LOVENOX) injection  40 mg Subcutaneous Q24H  . ferumoxytol  510 mg Intravenous Weekly  . hydrALAZINE  75 mg Oral 3 times per day  . insulin aspart  0-5 Units Subcutaneous QHS  . insulin aspart  0-9 Units Subcutaneous TID WC  . insulin glargine  30 Units Subcutaneous QHS  . insulin glargine  40 Units Subcutaneous Daily  . isosorbide mononitrate  30 mg Oral BID  . lipase/protease/amylase  3 capsule Oral BID AC  . nicotine  14 mg Transdermal Daily  . olopatadine  1 drop Both Eyes BID  . pantoprazole  40 mg Oral Daily  . potassium chloride  40 mEq Oral BID  . pregabalin  150 mg Oral BID  . simvastatin  10 mg Oral q1800  . sodium chloride  10-40 mL Intracatheter Q12H  . sodium polystyrene  15 g Oral Once  . spironolactone  12.5 mg Oral Daily  . torsemide  20 mg Oral BID  . cyanocobalamin  1,000 mcg Oral Daily   Continuous Infusions: . sodium chloride 250 mL (11/19/14 2248)  . milrinone 0.25 mcg/kg/min (11/20/14 0820)   PRN Meds:.acetaminophen, albuterol, benzonatate, guaiFENesin-dextromethorphan, ondansetron (ZOFRAN) IV, sodium chloride, tiZANidine, traMADol Medications Prior to Admission:  Prior to Admission medications   Medication Sig Start Date End Date Taking? Authorizing Provider  Amoxicillin-Pot Clavulanate (AUGMENTIN PO) Take by mouth 2 (two) times daily.    Historical Provider, MD  Blood Glucose Monitoring Suppl ([employer] BLOOD GLUCOSE METER) W/DEVICE KIT Check blood sugars 4 times daily, with meals and at  bedtime. 11/13/13   Maryann Mikhail, DO  [employer] VITAMIN B12 1000 MCG tablet Take 1,000 mcg by mouth daily. 10/15/14  Yes Historical Provider, MD  DULoxetine (CYMBALTA) 60 MG capsule Take 60 mg by mouth daily.   Yes Historical Provider, MD  esomeprazole (NEXIUM) 40 MG capsule Take 40 mg by mouth daily before breakfast.   Yes Historical Provider, MD  fluconazole (DIFLUCAN) 100 MG tablet Take 1 tablet (100 mg total) by mouth daily. Patient not taking: Reported on 11/12/2014 11/29/13   Jerene Bears, MD  furosemide (LASIX) 20 MG tablet Take 20 mg by mouth daily.   Yes Historical Provider, MD  glucose blood ([facility] BLOOD GLUCOSE TEST STRIPS) test strip Use as instructed 11/13/13  Maryann Mikhail, DO  insulin aspart (NOVOLOG) 100 UNIT/ML injection Inject 3 Units into the skin 3 (three) times daily with meals. Patient not taking: Reported on 11/12/2014 11/13/13   Maryann Mikhail, DO  insulin aspart (NOVOLOG) 100 UNIT/ML injection Inject 0-15 Units into the skin 3 (three) times daily with meals. CBG 70 - 120: 0 units CBG 121 - 150: 2 units CBG 151 - 200: 3 units CBG 201 - 250: 5 units CBG 251 - 300: 8 units CBG 301 - 350: 11 units CBG 351 - 400: 15 units 11/13/13  Yes Maryann Mikhail, DO  insulin glargine (LANTUS) 100 UNIT/ML injection Inject 0.7 mLs (70 Units total) into the skin daily. Patient taking differently: Inject 30-50 Units into the skin 2 (two) times daily. 50 units in the am and 20 units in the pm 11/13/13  Yes Maryann Mikhail, DO  lipase/protease/amylase (CREON-10/PANCREASE) 12000 UNITS CPEP Take 1-3 capsules by mouth 2 (two) times daily. 3 capsules before meal, and 1 capsule before snack   Yes Historical Provider, MD  lisinopril-hydrochlorothiazide (PRINZIDE,ZESTORETIC) 20-12.5 MG per tablet Take 1 tablet by mouth daily.   Yes Historical Provider, MD  metFORMIN (GLUCOPHAGE) 850 MG tablet Take 850 mg by mouth 2 (two) times daily with a meal.   Yes Historical Provider, MD  Olopatadine HCl (PATADAY) 0.2 %  SOLN Apply 1 drop to eye daily as needed. For allergies   Yes Historical Provider, MD  pioglitazone (ACTOS) 15 MG tablet Take 15 mg by mouth daily.   Yes Historical Provider, MD  pregabalin (LYRICA) 150 MG capsule Take 150 mg by mouth 2 (two) times daily.   Yes Historical Provider, MD  simvastatin (ZOCOR) 10 MG tablet Take 10 mg by mouth daily at 6 PM.   Yes Historical Provider, MD  thiamine 100 MG tablet Take 1 tablet (100 mg total) by mouth daily. Patient not taking: Reported on 11/12/2014 11/13/13   Maryann Mikhail, DO  tiZANidine (ZANAFLEX) 4 MG tablet Take 4 mg by mouth 2 (two) times daily as needed for muscle spasms.   Yes Historical Provider, MD  traMADol (ULTRAM) 50 MG tablet Take 50 mg by mouth every 6 (six) hours as needed for moderate pain.    Yes Historical Provider, MD   No Known Allergies CBC:    Component Value Date/Time   WBC 12.4* 11/20/2014 0430   HGB 9.3* 11/20/2014 0430   HCT 29.5* 11/20/2014 0430   PLT 207 11/20/2014 0430   MCV 67.5* 11/20/2014 0430   NEUTROABS 6.5 11/12/2014 2212   LYMPHSABS 1.1 11/12/2014 2212   MONOABS 0.9 11/12/2014 2212   EOSABS 0.2 11/12/2014 2212   BASOSABS 0.0 11/12/2014 2212   Comprehensive Metabolic Panel:    Component Value Date/Time   NA 137 11/20/2014 0430   K 3.6 11/20/2014 0430   CL 90* 11/20/2014 0430   CO2 37* 11/20/2014 0430   BUN 29* 11/20/2014 0430   CREATININE 1.44* 11/20/2014 0430   GLUCOSE 170* 11/20/2014 0430   CALCIUM 9.2 11/20/2014 0430   CALCIUM 12.5* 04/17/2008 0710   AST 40 11/12/2014 2345   ALT 32 11/12/2014 2345   ALKPHOS 390* 11/12/2014 2345   BILITOT 1.4* 11/12/2014 2345   PROT 7.3 11/12/2014 2345   ALBUMIN 3.5 11/12/2014 2345    Physical Exam: Vital Signs: BP 127/64 mmHg  Pulse 101  Temp(Src) 99 F (37.2 C) (Oral)  Resp 21  Ht 5' 7"  (1.702 m)  Wt 94.3 kg (207 lb 14.3 oz)  BMI 32.55 kg/m2  SpO2  95% SpO2: SpO2: 95 % O2 Device: O2 Device: Nasal Cannula O2 Flow Rate: O2 Flow Rate (L/min): 2  L/min Intake/output summary:  Intake/Output Summary (Last 24 hours) at 11/20/14 1126 Last data filed at 11/20/14 0900  Gross per 24 hour  Intake  554.6 ml  Output   3865 ml  Net -3310.4 ml   LBM:   Baseline Weight: Weight: 110.2 kg (242 lb 15.2 oz) Most recent weight: Weight: 94.3 kg (207 lb 14.3 oz)  Exam Findings:  Gen: NAD, sitting up on side of bed HEENT: [place]/AT, no JVD, moist mucous membranes CV: Tachycardic Pulm: No labored breathing, symmetric, nasal cannula Abd: Soft, NT, ND Extremities: MAE, BLE 1+ edema much improved Neuro: Awake, alert, oriented x 3, pleasant affect         Palliative Performance Scale: 60%               Additional Data Reviewed: Recent Labs     11/19/14  0417  11/20/14  0430  WBC  12.5*  12.4*  HGB  8.8*  9.3*  PLT  183  207  NA  139  137  BUN  35*  29*  CREATININE  1.29*  1.44*     Time In: 1020 Time Out: 1120 Time Total: 32mn  Greater than 50%  of this time was spent counseling and coordinating care related to the above assessment and plan.  Signed by: PPershing Proud NP  APershing Proud NP  58/37/5423 11:26 AM  Please contact Palliative Medicine Team phone at [phone] questions and concerns.

## 2014-11-20 NOTE — Care Management Note (Signed)
Spoke w pt and wife. Went over [employer] and no pref. Will use [facility] home[facility] . Pt may need home milrinone. Spoke w pam at [facility] and they will follow for home milrinone.

## 2014-11-21 DIAGNOSIS — R0600 Dyspnea, unspecified: Secondary | ICD-10-CM

## 2014-11-21 LAB — CBC
HCT: 29.9 % — ABNORMAL LOW (ref 39.0–52.0)
Hemoglobin: 9.2 g/dL — ABNORMAL LOW (ref 13.0–17.0)
MCH: 20.7 pg — ABNORMAL LOW (ref 26.0–34.0)
MCHC: 30.8 g/dL (ref 30.0–36.0)
MCV: 67.3 fL — ABNORMAL LOW (ref 78.0–100.0)
PLATELETS: 207 10*3/uL (ref 150–400)
RBC: 4.44 MIL/uL (ref 4.22–5.81)
RDW: 19.5 % — AB (ref 11.5–15.5)
WBC: 11.4 10*3/uL — ABNORMAL HIGH (ref 4.0–10.5)

## 2014-11-21 LAB — GLUCOSE, CAPILLARY
GLUCOSE-CAPILLARY: 227 mg/dL — AB (ref 65–99)
Glucose-Capillary: 215 mg/dL — ABNORMAL HIGH (ref 65–99)

## 2014-11-21 LAB — CARBOXYHEMOGLOBIN
CARBOXYHEMOGLOBIN: 2.3 % — AB (ref 0.5–1.5)
METHEMOGLOBIN: 0.9 % (ref 0.0–1.5)
O2 Saturation: 55.9 %
Total hemoglobin: 9.5 g/dL — ABNORMAL LOW (ref 13.5–18.0)

## 2014-11-21 LAB — BASIC METABOLIC PANEL
Anion gap: 10 (ref 5–15)
BUN: 29 mg/dL — AB (ref 6–20)
CO2: 34 mmol/L — ABNORMAL HIGH (ref 22–32)
CREATININE: 1.53 mg/dL — AB (ref 0.61–1.24)
Calcium: 8.9 mg/dL (ref 8.9–10.3)
Chloride: 89 mmol/L — ABNORMAL LOW (ref 101–111)
GFR calc Af Amer: 58 mL/min — ABNORMAL LOW (ref >60)
GFR calc non Af Amer: 50 mL/min — ABNORMAL LOW (ref >60)
GLUCOSE: 298 mg/dL — AB (ref 65–99)
POTASSIUM: 4.4 mmol/L (ref 3.5–5.1)
Sodium: 133 mmol/L — ABNORMAL LOW (ref 135–145)

## 2014-11-21 MED ORDER — DM-GUAIFENESIN ER 30-600 MG PO TB12
1.0000 | ORAL_TABLET | Freq: Two times a day (BID) | ORAL | Status: DC
  Administered 2014-11-21: 1 via ORAL
  Filled 2014-11-21 (×4): qty 1

## 2014-11-21 MED ORDER — DM-GUAIFENESIN ER 30-600 MG PO TB12: 1.0000 | ORAL_TABLET | Freq: Two times a day (BID) | ORAL | Status: DC

## 2014-11-21 MED ORDER — INSULIN GLARGINE 100 UNIT/ML ~~LOC~~ SOLN: 30.0000 [IU] | Freq: Two times a day (BID) | SUBCUTANEOUS | Status: DC

## 2014-11-21 MED ORDER — SPIRONOLACTONE 25 MG PO TABS: 12.5000 mg | ORAL_TABLET | Freq: Every day | ORAL | Status: DC

## 2014-11-21 MED ORDER — BENZONATATE 100 MG PO CAPS
200.0000 mg | ORAL_CAPSULE | Freq: Three times a day (TID) | ORAL | Status: DC
  Administered 2014-11-21: 200 mg via ORAL
  Filled 2014-11-21 (×2): qty 2

## 2014-11-21 MED ORDER — POTASSIUM CHLORIDE ER 20 MEQ PO TBCR: 20.0000 meq | EXTENDED_RELEASE_TABLET | Freq: Two times a day (BID) | ORAL | Status: DC

## 2014-11-21 MED ORDER — FUROSEMIDE 10 MG/ML IJ SOLN
80.0000 mg | Freq: Once | INTRAMUSCULAR | Status: AC
  Administered 2014-11-21: 80 mg via INTRAVENOUS
  Filled 2014-11-21: qty 8

## 2014-11-21 MED ORDER — NICOTINE 14 MG/24HR TD PT24: 14.0000 mg | MEDICATED_PATCH | Freq: Every day | TRANSDERMAL | Status: DC

## 2014-11-21 MED ORDER — MILRINONE IN DEXTROSE 20 MG/100ML IV SOLN: 0.2500 ug/kg/min | INTRAVENOUS | Status: DC

## 2014-11-21 MED ORDER — BENZONATATE 200 MG PO CAPS: 200.0000 mg | ORAL_CAPSULE | Freq: Three times a day (TID) | ORAL | Status: DC

## 2014-11-21 MED ORDER — ISOSORBIDE MONONITRATE ER 30 MG PO TB24: 30.0000 mg | ORAL_TABLET | Freq: Two times a day (BID) | ORAL | Status: DC

## 2014-11-21 MED ORDER — ACETAMINOPHEN 500 MG PO TABS: 1000.0000 mg | ORAL_TABLET | Freq: Four times a day (QID) | ORAL | Status: DC | PRN

## 2014-11-21 MED ORDER — TORSEMIDE 20 MG PO TABS: 20.0000 mg | ORAL_TABLET | Freq: Two times a day (BID) | ORAL | Status: DC

## 2014-11-21 MED ORDER — HYDRALAZINE HCL 25 MG PO TABS: 75.0000 mg | ORAL_TABLET | Freq: Three times a day (TID) | ORAL | Status: DC

## 2014-11-21 NOTE — Progress Notes (Signed)
Advanced Heart Failure Rounding Note   Subjective:   54 yo male w/ hx nl EF 2015, remote pancreatitis, DM, ETOH abuse (quit > 5 year) admitted 05/09 w/ CHF, EF now 20-25%. Limited mobility uses crutches.   Transferred to [facility] from [facility] for HF input due to worsening renal function. Had [facility] as noted below with cardiogenic shock, low output, and elevated filling pressures. Started on milrinone and IV lasix.   Events: - RHC 5/11 milrinone started - 5/13 received feraheme  PICC placed. Yesterday milrinone dropped to 0.125 mcg but co-ox dropped to 46% Now back on 0.25. CO-OX 56% this am. Creatinine slightly worse. Torsemide dropped to 20 bid. Weight stable. CVP 8-9. + hacking cough. HR remains 115-120   RHC 11/15/2014.  RA = 29 RV = 51/21/30 PA = 48/29 (38) PCW = 30 Fick cardiac output/index = 3.8/1.7 Thermo CO/CI = 3.7/1.7 PVR = 2.9 WU SVR = 1,502  Ao sat = 100% PA sat = 45%, 45% Ao = 131/86 (100)    Objective:   Weight Range:  Vital Signs:   Temp:  [98.6 F (37 C)-99.3 F (37.4 C)] 98.9 F (37.2 C) (05/17 0430) Resp:  [18-29] 21 (05/17 0800) BP: (94-130)/(55-71) 130/65 mmHg (05/17 0800) SpO2:  [96 %-97 %] 96 % (05/17 0800) Weight:  [93.9 kg (207 lb 0.2 oz)] 93.9 kg (207 lb 0.2 oz) (05/17 0424) Last BM Date: 11/20/14  Weight change: Filed Weights   11/19/14 0403 11/20/14 0400 11/21/14 0424  Weight: 98.8 kg (217 lb 13 oz) 94.3 kg (207 lb 14.3 oz) 93.9 kg (207 lb 0.2 oz)    Intake/Output:   Intake/Output Summary (Last 24 hours) at 11/21/14 0835 Last data filed at 11/21/14 0800  Gross per 24 hour  Intake  870.4 ml  Output   3050 ml  Net -2179.6 ml     Physical Exam: CVP 8-9 General:  Sitting in chair. Hacking cough.  No resp difficulty HEENT: normal Neck: supple. JVP flat. Carotids 2+ bilat; no bruits. No lymphadenopathy or thryomegaly appreciated. Cor: PMI laterally displaced. Tachy regular. 2/6 MR. + S3  Lungs: clear Abdomen: soft, nontender,  nondistended. No hepatosplenomegaly. No bruits or masses. Good bowel sounds. Extremities: no cyanosis, clubbing, rash,  RLE and LLE no edema.   Neuro: alert & orientedx3, cranial nerves grossly intact. moves all 4 extremities w/o difficulty. Affect pleasant  Telemetry: NSR 110-120  Labs: Basic Metabolic Panel:  Recent Labs Lab 11/16/14 0425 11/17/14 0305 11/18/14 0522 11/19/14 0417 11/20/14 0430 11/21/14 0420  NA 136 133* 134* 139 137 133*  K 4.5 4.1 3.8 3.7 3.6 4.4  CL 100* 95* 94* 92* 90* 89*  CO2 27 30 33* 37* 37* 34*  GLUCOSE 357* 316* 238* 145* 170* 298*  BUN 73* 60* 42* 35* 29* 29*  CREATININE 2.57* 1.80* 1.35* 1.29* 1.44* 1.53*  CALCIUM 8.4* 8.5* 8.6* 8.9 9.2 8.9  MG 1.5*  --  1.6*  --   --   --     Liver Function Tests: No results for input(s): AST, ALT, ALKPHOS, BILITOT, PROT, ALBUMIN in the last 168 hours. No results for input(s): LIPASE, AMYLASE in the last 168 hours. No results for input(s): AMMONIA in the last 168 hours.  CBC:  Recent Labs Lab 11/16/14 0421 11/17/14 0830 11/19/14 0417 11/20/14 0430 11/21/14 0420  WBC 8.3 11.4* 12.5* 12.4* 11.4*  HGB 8.4* 8.8* 8.8* 9.3* 9.2*  HCT 26.8* 28.8* 28.2* 29.5* 29.9*  MCV 68.0* 68.4* 67.8* 67.5* 67.3*  PLT 274 132* 183 207 207    Cardiac Enzymes: No results for input(s): CKTOTAL, CKMB, CKMBINDEX, TROPONINI in the last 168 hours.  BNP: BNP (last 3 results)  Recent Labs  11/12/14 2212  BNP 455.8*    ProBNP (last 3 results) No results for input(s): PROBNP in the last 8760 hours.    Other results:  Imaging: No results found.   Medications:     Scheduled Medications: . antiseptic oral rinse  7 mL Mouth Rinse BID  . DULoxetine  60 mg Oral Daily  . enoxaparin (LOVENOX) injection  40 mg Subcutaneous Q24H  . ferumoxytol  510 mg Intravenous Weekly  . hydrALAZINE  75 mg Oral 3 times per day  . insulin aspart  0-5 Units Subcutaneous QHS  . insulin aspart  0-9 Units Subcutaneous TID WC  .  insulin glargine  30 Units Subcutaneous QHS  . insulin glargine  40 Units Subcutaneous Daily  . isosorbide mononitrate  30 mg Oral BID  . lipase/protease/amylase  3 capsule Oral BID AC  . nicotine  14 mg Transdermal Daily  . olopatadine  1 drop Both Eyes BID  . pantoprazole  40 mg Oral Daily  . potassium chloride  20 mEq Oral BID  . pregabalin  150 mg Oral BID  . simvastatin  10 mg Oral q1800  . sodium chloride  10-40 mL Intracatheter Q12H  . spironolactone  12.5 mg Oral Daily  . torsemide  20 mg Oral BID  . cyanocobalamin  1,000 mcg Oral Daily    Infusions: . sodium chloride 3 mL/hr at 11/20/14 1945  . milrinone 0.25 mcg/kg/min (11/21/14 0800)    PRN Medications: acetaminophen, albuterol, benzonatate, guaiFENesin-dextromethorphan, ondansetron (ZOFRAN) IV, sodium chloride, tiZANidine, traMADol, zolpidem   Assessment:   1. Cardiogenic Shock 2. A/C Systolic Heart Failure  3. HTN 4. 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 DM II 6. Hypomagnesium  7. Hyperkalemia 8. H/O Pancreatitis 9. Limited mobility 10. Anemia, iron-deficiency   Plan/Discussion:    RHC with cardiogenic shock. Improving on milrinone though remains tachy.   Can go home today on milrinone 0.25. Will; have [facility] team see prior to d/c. Long talk about use of sliding scale diuretics, Will start with torsemide 20 bid. Consider adding ACE as outpatient if renal function stable.   Check swallow study prior to d/c.   Length of Stay: 8 Kathaleen Dudziak MD 11/21/2014, 8:35 AM Advanced Heart Failure Team Pager [phone] (M-F; 7a - 4p)  Please contact [facility] for night-coverage after hours (4p -7a ) and weekends on [email or web address]

## 2014-11-21 NOTE — Discharge Summary (Signed)
Patient ID: Christian Wall MRN: [id number] DOB/AGE: [birth year] 54 y.o.  Admit date: 11/12/2014 Discharge date: 11/21/2014  Admission Diagnoses: SOB  Discharge Diagnoses:  Principal Problem:   Acute systolic CHF (congestive heart failure), NYHA class 3 - discharge weight 207 pounds (242 pounds on admission) Active Problems:   CHF (congestive heart failure)   Essential hypertension, now with hypotension at times, therefore no beta blocker   Hypoglycemia   Chronic renal insufficiency   Diabetes mellitus type II, uncontrolled   Peripheral neuropathy   Acute renal failure - improved on current medications, reason for no Ace or ARB   Palliative care encounter   Weakness   Discharged Condition: good  Hospital Course: 54 yo male w/ hx nl EF 2015, remote pancreatitis, DM, ETOH abuse (quit > 5 year) admitted 05/09 w/ CHF. Limited mobility uses crutches.   2-D echocardiogram was performed and showed an EF of 20-25 percent. He was initially diuresed but developed cardiorenal syndrome and increasing creatinine (peak BUN/creatinine 72/3.22). He was initially at [facility], but was transferred to [facility] for further evaluation and treatment.  At [facility], he was managed by the CHF team. He had a right heart catheterization on 11/14/2013, results below. Swan Numbers RA 16 PAP 39/20 PCWP 18 CO/CI 5.7/2.6  RHC 11/15/2014.  RA = 29 RV = 51/21/30 PA = 48/29 (38) PCW = 30 Fick cardiac output/index = 3.8/1.7 Thermo CO/CI = 3.7/1.7 PVR = 2.9 WU SVR = 1,502  Ao sat = 100% PA sat = 45%, 45% Ao = 131/86 (100)   He was in cardiogenic shock with low output and elevated filling pressures. He was started on milrinone and diuresed with IV Lasix. His urine output increased and his CO-OX improved. His magnesium was low at 1.5 and was supplemented. His discharge weight is 207 pounds. At home, because he is unable to stand without assistance, he will weigh himself on his home scale with his braces. He  is to track his weight and report gains. The importance of a low sodium diabetic diet was reinforced. The importance of tobacco cessation was reinforced and his wife agrees to quit as well.   His diabetes medications were adjusted for better blood sugar control. Because of renal insufficiency, the metformin and Actos were discontinued. His Lantus doses were changed and knee is encouraged to follow-up with his primary care physician.  He was noted to be anemic, with a hemoglobin at approximately the same level it was when he left the hospital in May 2015. He was given one infusion of Feraheme and is to follow-up with primary care for this. Discharge CBC is below.  His renal function improved on the milrinone as did his oxygenation. The dose was decreased, but his oxygenation decreased so he is going home on 0.25 mcg/kg/m. The home health agency will manage this as an outpatient. Because of the renal insufficiency, an ARB or ACE inhibitor will not be prescribed. Additionally, his blood pressure is low at times and because of this, he is not on a beta blocker.  A palliative care consult was called to establish goals of care. He is encouraged to discuss his wishes and healthcare desires with his wife but currently is a full code.  A speech pathology consult was called because of a cough that he has had problems with for months. It is productive at times. He is on Robitussin DM for this and was also started on [employer] Speech pathology and not find any aspiration or  other significant abnormality. Because of his GERD and history of esophageal candidiasis, he is encouraged to follow-up with Dr. Hilarie Fredrickson with GI.   On 11/21/2014, he was seen by Dr. Haroldine Laws and all data were reviewed. His respiratory status is felt to be at baseline. He was seen by the VAD coordinator and given information on this procedure. He will have early follow-up in the office and is encouraged to refrain from smoking, stick to a  low sodium diabetic diet, and do daily weights in addition to medication compliance. No further inpatient workup was indicated and he is considered stable for discharge, to follow-up as an outpatient.  Consults: Palliative care, speech pathology  Significant Diagnostic Studies:  Dg Chest 2 View 11/12/2014   CLINICAL DATA:  Dyspnea, onset today. Lower extremity swelling for 1 week.  EXAM: CHEST  2 VIEW  COMPARISON:  11/09/2013  FINDINGS: There is moderate cardiomegaly and pulmonary hyperinflation. There is mild interstitial fluid or thickening. There is no confluent airspace opacity. There is no pleural effusion.  IMPRESSION: Cardiomegaly and hyperinflation. Mild interstitial thickening in the basilar periphery could represent mild congestive failure.   Electronically Signed   By: Andreas Newport M.D.   On: 11/12/2014 23:20   US Renal 11/14/2014   CLINICAL DATA:  Acute renal failure.  EXAM: RENAL / URINARY TRACT ULTRASOUND COMPLETE  COMPARISON:  CT of 11/09/2013  FINDINGS: Right Kidney:  Length: 12.3 cm. No hydronephrosis. Normal renal cortical thickness. Mildly increased echogenicity.  Left Kidney:  Length: 11.6 cm. Normal renal cortical thickness and echogenicity. No hydronephrosis. 2 lower pole left renal lesions. Most consistent with cysts or minimally complex cysts. These measure maximally 3.8 and 3.2 cm respectively.  Bladder:  Collapsed around a Foley catheter.  IMPRESSION: 1.  No hydronephrosis. 2. Mildly increased right renal echogenicity, suggesting medical renal disease.   Electronically Signed   By: Abigail Miyamoto M.D.   On: 11/14/2014 21:11   Dg Chest Port 1 View 11/17/2014   CLINICAL DATA:  Chest congestion, smoker  EXAM: PORTABLE CHEST - 1 VIEW  COMPARISON:  11/12/2014  FINDINGS: Cardiomegaly. There is right IJ Swan-Ganz catheter with tip in right lower lobe pulmonary artery. No pneumothorax. Stable right base medially atelectasis or scarring. No convincing pulmonary edema.  IMPRESSION: There  is right IJ Swan-Ganz catheter with tip in right lower lobe pulmonary artery. No pneumothorax. Stable right base medially atelectasis or scarring. No convincing pulmonary edema.   Electronically Signed   By: Lahoma Crocker M.D.   On: 11/17/2014 08:23   Dg Knee Complete 4 Views Right 11/12/2014   CLINICAL DATA:  Abdominal swelling. Right knee pain and swelling for 1 week. Also shortness of breath today.  EXAM: RIGHT KNEE - COMPLETE 4+ VIEW  COMPARISON:  None.  FINDINGS: Deformity and heterotopic ossification around the medial right femoral condyle, possibly representing old fracture deformity. Mild degenerative changes in the right knee. Osseous fragments in the anterior joint on the lateral view may represent foreign bodies. No significant effusion. No evidence of acute fracture or dislocation.  IMPRESSION: Deformity and heterotopic calcification at the medial femoral condyle and metaphysis probably representing old fracture deformity. Mild degenerative changes. No acute bony abnormalities.   Electronically Signed   By: Lucienne Capers M.D.   On: 11/12/2014 23:18   Lab Results  Component Value Date   WBC 11.4* 11/21/2014   HGB 9.2* 11/21/2014   HCT 29.9* 11/21/2014   MCV 67.3* 11/21/2014   PLT 207 11/21/2014  Recent Labs Lab 11/21/14 0420  NA 133*  K 4.4  CL 89*  CO2 34*  BUN 29*  CREATININE 1.53*  CALCIUM 8.9  GLUCOSE 298*   Lab Results  Component Value Date   ALT 32 11/12/2014   AST 40 11/12/2014   ALKPHOS 390* 11/12/2014   BILITOT 1.4* 11/12/2014   MAGNESIUM  Date Value Ref Range Status  11/18/2014 1.6* 1.7 - 2.4 mg/dL Final   Lab Results  Component Value Date   HGBA1C 7.4* 11/13/2014    Treatments/Procedures:  Right heart cath  PICC line placement Renal ultrasound Knee and chest x-rays  Discharge Exam: Blood pressure 130/65, pulse 101, temperature 98.3 F (36.8 C), temperature source Oral, resp. rate 21, height 5' 7"  (1.702 m), weight 207 lb 0.2 oz (93.9 kg), SpO2  96 %. Physical Exam: CVP 7  05/16 General: Sitting in chair. No resp difficulty once coughing paroxysm is over HEENT: normal Neck: supple. JVP flat. Carotids 2+ bilat; no bruits. No lymphadenopathy or thryomegaly appreciated. Cor: PMI laterally displaced. Tachy regular. 2/6 MR. + S3  Lungs: clear Abdomen: soft, nontender, nondistended. No hepatosplenomegaly. No bruits or masses. Good bowel sounds. Extremities: no cyanosis, clubbing, rash, RLE and LLE no edema.  Neuro: alert & orientedx3, cranial nerves grossly intact. moves all 4 extremities w/o difficulty. Affect pleasant  Disposition: 01-Home or Self Care      Discharge Instructions    Avoid straining    Complete by:  As directed      Contraindication to ACEI at discharge    Complete by:  As directed      Diet - low sodium heart healthy    Complete by:  As directed   2000 milligrams of sodium daily     Diet Carb Modified    Complete by:  As directed      Heart Failure patients record your daily weight using the same scale at the same time of day    Complete by:  As directed      Increase activity slowly    Complete by:  As directed      Referral to Smoking Cessation Program    Complete by:  As directed      STOP any activity that causes chest pain, shortness of breath, dizziness, sweating, or exessive weakness    Complete by:  As directed             Medication List    STOP taking these medications        AUGMENTIN PO     fluconazole 100 MG tablet  Commonly known as:  DIFLUCAN     furosemide 20 MG tablet  Commonly known as:  LASIX     lisinopril-hydrochlorothiazide 20-12.5 MG per tablet  Commonly known as:  PRINZIDE,ZESTORETIC     metFORMIN 850 MG tablet  Commonly known as:  GLUCOPHAGE     pioglitazone 15 MG tablet  Commonly known as:  ACTOS     thiamine 100 MG tablet      TAKE these medications        acetaminophen 500 MG tablet  Commonly known as:  TYLENOL  Take 2 tablets (1,000 mg total) by  mouth every 6 (six) hours as needed for mild pain or headache.     benzonatate 200 MG capsule  Commonly known as:  TESSALON  Take 1 capsule (200 mg total) by mouth 3 (three) times daily.     [employer]S BLOOD GLUCOSE METER W/DEVICE Kit  Check blood sugars  4 times daily, with meals and at bedtime.     [employer]S VITAMIN B12 1000 MCG tablet  Generic drug:  cyanocobalamin  Take 1,000 mcg by mouth daily.     dextromethorphan-guaiFENesin 30-600 MG per 12 hr tablet  Commonly known as:  MUCINEX DM  Take 1 tablet by mouth 2 (two) times daily.     DULoxetine 60 MG capsule  Commonly known as:  CYMBALTA  Take 60 mg by mouth daily.     esomeprazole 40 MG capsule  Commonly known as:  NEXIUM  Take 40 mg by mouth daily before breakfast.     glucose blood test strip  Commonly known as:  CVS BLOOD GLUCOSE TEST STRIPS  Use as instructed     hydrALAZINE 25 MG tablet  Commonly known as:  APRESOLINE  Take 3 tablets (75 mg total) by mouth every 8 (eight) hours.     insulin aspart 100 UNIT/ML injection  Commonly known as:  novoLOG  - Inject 0-15 Units into the skin 3 (three) times daily with meals. CBG 70 - 120: 0 units  - CBG 121 - 150: 2 units  - CBG 151 - 200: 3 units  - CBG 201 - 250: 5 units  - CBG 251 - 300: 8 units  - CBG 301 - 350: 11 units  - CBG 351 - 400: 15 units     insulin glargine 100 UNIT/ML injection  Commonly known as:  LANTUS  Inject 0.3-0.4 mLs (30-40 Units total) into the skin 2 (two) times daily. 40 U am, 30 U pm     isosorbide mononitrate 30 MG 24 hr tablet  Commonly known as:  IMDUR  Take 1 tablet (30 mg total) by mouth 2 (two) times daily.     lipase/protease/amylase 12000 UNITS Cpep capsule  Commonly known as:  CREON  Take 1-3 capsules by mouth 2 (two) times daily. 3 capsules before meal, and 1 capsule before snack     milrinone 20 MG/100ML Soln infusion  Commonly known as:  PRIMACOR  Inject 23.475 mcg/min into the vein continuous.     nicotine 14 mg/24hr patch    Commonly known as:  NICODERM CQ - dosed in mg/24 hours  Place 1 patch (14 mg total) onto the skin daily.     PATADAY 0.2 % Soln  Generic drug:  Olopatadine HCl  Apply 1 drop to eye daily as needed. For allergies     Potassium Chloride ER 20 MEQ Tbcr  Take 20 mEq by mouth 2 (two) times daily.     pregabalin 150 MG capsule  Commonly known as:  LYRICA  Take 150 mg by mouth 2 (two) times daily.     simvastatin 10 MG tablet  Commonly known as:  ZOCOR  Take 10 mg by mouth daily at 6 PM.     spironolactone 25 MG tablet  Commonly known as:  ALDACTONE  Take 0.5 tablets (12.5 mg total) by mouth daily.     tiZANidine 4 MG tablet  Commonly known as:  ZANAFLEX  Take 4 mg by mouth 2 (two) times daily as needed for muscle spasms.     torsemide 20 MG tablet  Commonly known as:  DEMADEX  Take 1 tablet (20 mg total) by mouth 2 (two) times daily.     traMADol 50 MG tablet  Commonly known as:  ULTRAM  Take 50 mg by mouth every 6 (six) hours as needed for moderate pain.       Follow-up Information  Follow up with Glori Bickers, MD On 11/28/2014.   Specialty:  Cardiology   Why:  See provider at 10:30 am, please arrive 15 minutes early for paperwork. Parking garage code is [id number].    Contact information:   [place] [address] [phone]       Signed: Rosaria Ferries 11/21/2014, 11:33 AM   Patient seen and examined with Rosaria Ferries, PA-C. We discussed all aspects of the encounter. I agree with the assessment and plan as stated above. He is improved. D/c home today on milrinone. Follow closely in HF clinic.   Shantale Holtmeyer,MD 11:43 PM

## 2014-11-21 NOTE — Evaluation (Signed)
Clinical/Bedside Swallow Evaluation Patient Details  Name: Christian Wall MRN: [id number] Date of Birth: 07-31-1960  Today's Date: 11/21/2014 Time: SLP Start Time (ACUTE ONLY): 1100 SLP Stop Time (ACUTE ONLY): 1114 SLP Time Calculation (min) (ACUTE ONLY): 14 min  Past Medical History:  Past Medical History  Diagnosis Date  . Diabetes mellitus   . Atrophy of calf muscles   . Depression   . GERD (gastroesophageal reflux disease)   . Peripheral neuropathy   . Hypercholesteremia   . Hx of tracheostomy    Past Surgical History:  Past Surgical History  Procedure Laterality Date  . Pancreas surgery  2001    hx pancreatitis  . Anterior cervical decomp/discectomy fusion  04/13/2012    Procedure: ANTERIOR CERVICAL DECOMPRESSION/DISCECTOMY FUSION 1 LEVEL;  Surgeon: Temple Pacini, MD;  Location: [facility] NEURO ORS;  Service: Neurosurgery;  Laterality: N/A;  Cervcial Five-Six Anterior Cervical Decompression and Fusion with Allograft and Plating  . Cardiac catheterization N/A 11/15/2014    Procedure: Right Heart Cath;  Surgeon: Dolores Patty, MD;  Location: [facility] INVASIVE CV LAB;  Service: Cardiovascular;  Laterality: N/A;   HPI:  54 yo male w/ hx nl EF 2015, remote pancreatitis, DM, ETOH abuse (quit > 5 year) admitted 05/09 w/ CHF, EF now 20-25%. Limited mobility uses crutches. Transferred to [facility] from [facility] for HF input due to worsening renal function. Had RHC as noted below with cardiogenic shock, low output, and elevated filling pressures. Started on milrinone and IV lasix. Preparing to return home with IV milrinone. MD notes SLP should check swallow, but no history of dysphagia found in chart.   Assessment / Plan / Recommendation Clinical Impression  Pt demonstrates no signs of aspriation with subjective assessment other than cough, which was present before and long after any POs given. Swallow timely and strong and no wet vocal quality or immediate coughing following intake. Pt also has not had any  acute changes that would have suddenly affected swallow function. Would suspect aspiration low on the differential in regards to cause of cough. Pt does report chronic GERD and was diagnosed with esophageal candidiasis one year ago via endoscopy. If coughing persists, would consider esophageal assessment or f/u with GI.     Aspiration Risk  Mild    Diet Recommendation  (regular/thin)   Medication Administration: Whole meds with liquid    Other  Recommendations Recommended Consults: Consider GI evaluation;Consider esophageal assessment Oral Care Recommendations: Patient independent with oral care   Follow Up Recommendations       Frequency and Duration        Pertinent Vitals/Pain NA    SLP Swallow Goals     Swallow Study Prior Functional Status       General Other Pertinent Information: 54 yo male w/ hx nl EF 2015, remote pancreatitis, DM, ETOH abuse (quit > 5 year) admitted 05/09 w/ CHF, EF now 20-25%. Limited mobility uses crutches. Transferred to [facility] from [facility] for HF input due to worsening renal function. Had RHC as noted below with cardiogenic shock, low output, and elevated filling pressures. Started on milrinone and IV lasix. Preparing to return home with IV milrinone. MD notes SLP should check swallow, but no history of dysphagia found in chart. Type of Study: Bedside swallow evaluation Previous Swallow Assessment: Endoscopy 5/15 - esophageal candidiasis Diet Prior to this Study: Regular;Thin liquids Temperature Spikes Noted: No Respiratory Status: Room air History of Recent Intubation: No Behavior/Cognition: Alert;Cooperative;Pleasant mood Oral Cavity - Dentition: Adequate natural dentition/normal for age  Self-Feeding Abilities: Able to feed self Patient Positioning: Upright in chair/Tumbleform Baseline Vocal Quality: Normal Volitional Cough: Strong;Other (Comment) (coughing very frequently before PO intake) Volitional Swallow: Able to elicit    Oral/Motor/Sensory  Function Overall Oral Motor/Sensory Function: Appears within functional limits for tasks assessed   Ice Chips     Thin Liquid Thin Liquid: Within functional limits Presentation: Cup;Straw;Self Fed    Nectar Thick Nectar Thick Liquid: Not tested   Honey Thick Honey Thick Liquid: Not tested   Puree Puree: Within functional limits (coughing after)   Solid   GO    Solid: Within functional limits (coughin during with food in mouth)      [name], MA CCC-SLP [phone]  [name], Riley Nearing 11/21/2014,11:21 AM

## 2014-11-21 NOTE — Progress Notes (Signed)
Daily Progress Note   Patient Name: Christian Wall       Date: 11/21/2014 DOB: [birth year]  Age: 54 y.o. MRN#: [id number] Attending Physician: Dolores Patty, MD Primary Care Physician: Dorrene German, MD Admit Date: 11/12/2014  Reason for Consultation/Follow-up: VAD  Subjective: Christian Wall is very open and has been initially educated on VAD. We discussed this today and he appears to be open to this option but also admits he has been through a lot in his life and isn't sure that he has it in him to go through VAD procedure. Encouraged him to investigate and consider further. His hesitancy seems healthy in the sense that he wants to be sure before he has all the information and feels this is a good fit for him before jumping right in. He is interested in learning more and going home and reading/researching. He does also tell me that he is not afraid to die and that he knows "his day will come." He also expresses that he would not want a feeding tube or to be on long term ventilation.    Length of Stay: 8 days  Current Medications: Scheduled Meds:  . antiseptic oral rinse  7 mL Mouth Rinse BID  . benzonatate  200 mg Oral TID  . dextromethorphan-guaiFENesin  1 tablet Oral BID  . DULoxetine  60 mg Oral Daily  . enoxaparin (LOVENOX) injection  40 mg Subcutaneous Q24H  . ferumoxytol  510 mg Intravenous Weekly  . hydrALAZINE  75 mg Oral 3 times per day  . insulin aspart  0-5 Units Subcutaneous QHS  . insulin aspart  0-9 Units Subcutaneous TID WC  . insulin glargine  30 Units Subcutaneous QHS  . insulin glargine  40 Units Subcutaneous Daily  . isosorbide mononitrate  30 mg Oral BID  . lipase/protease/amylase  3 capsule Oral BID AC  . nicotine  14 mg Transdermal Daily  . olopatadine  1 drop Both Eyes BID  . pantoprazole  40 mg Oral Daily  . potassium chloride  20 mEq Oral BID  . pregabalin  150 mg Oral BID  . simvastatin  10 mg Oral q1800  . sodium chloride  10-40 mL Intracatheter  Q12H  . spironolactone  12.5 mg Oral Daily  . torsemide  20 mg Oral BID  . cyanocobalamin  1,000 mcg Oral Daily    Continuous Infusions: . sodium chloride 3 mL/hr at 11/20/14 1945  . milrinone 0.25 mcg/kg/min (11/21/14 0800)    PRN Meds: acetaminophen, albuterol, guaiFENesin-dextromethorphan, ondansetron (ZOFRAN) IV, sodium chloride, tiZANidine, traMADol, zolpidem  Palliative Performance Scale: 60%     Vital Signs: BP 123/65 mmHg  Pulse 101  Temp(Src) 98.1 F (36.7 C) (Oral)  Resp 17  Ht  (1.702 m)  Wt 93.9 kg (207 lb 0.2 oz)  BMI 32.42 kg/m2  SpO2 96% SpO2: SpO2: 96 % O2 Device: O2 Device: Not Delivered O2 Flow Rate: O2 Flow Rate (L/min): 2 L/min  Intake/output summary:  Intake/Output Summary (Last 24 hours) at 11/21/14 1552 Last data filed at 11/21/14 1200  Gross per 24 hour  Intake  812.1 ml  Output   3150 ml  Net -2337.9 ml   Baseline Weight: Weight: 110.2 kg (242 lb 15.2 oz) Most recent weight: Weight: 93.9 kg (207 lb 0.2 oz)  Physical Exam: Gen: NAD, sitting up on side of bed HEENT: /AT, no JVD, moist mucous membranes CV: Tachycardic Pulm: No labored breathing, symmetric, nasal cannula Abd: Soft, NT, ND Extremities: MAE, BLE 1+  edema much improved Neuro: Awake, alert, oriented x 3, pleasant affect               Additional Data Reviewed: Recent Labs     11/20/14  0430  11/21/14  0420  WBC  12.4*  11.4*  HGB  9.3*  9.2*  PLT  207  207  NA  137  133*  BUN  29*  29*  CREATININE  1.44*  1.53*     Problem List:  Patient Active Problem List   Diagnosis Date Noted  . Palliative care encounter 11/20/2014  . Weakness 11/20/2014  . Acute renal failure   . CHF (congestive heart failure) 11/13/2014  . Acute systolic CHF (congestive heart failure), NYHA class 3 11/13/2014  . Essential hypertension 11/13/2014  . Hypoglycemia 11/13/2014  . Chronic renal insufficiency 11/13/2014  . Diabetes mellitus type II, uncontrolled 11/13/2014  .  Peripheral neuropathy 11/13/2014  . Dehydration 11/10/2013  . Non-adherence to medical treatment 11/10/2013  . Poor venous access 11/10/2013  . Anemia 11/10/2013  . Abnormal EKG 11/10/2013  . Acute Metabolic encephalopathy 11/10/2013  . Alcoholism 11/10/2013  . Esophageal thickening 11/10/2013  . Gastric wall thickening 11/10/2013  . Left Renal lesion 11/10/2013  . DKA (diabetic ketoacidoses) 11/09/2013  . Hyperosmolar non-ketotic state in patient with type 2 diabetes mellitus 11/09/2013  . ARF (acute renal failure) 11/09/2013  . Chest pain 11/09/2013  . Herniation of cervical intervertebral disc with radiculopathy 04/13/2012     Palliative Care Assessment & Plan    Code Status:  Full code  Goals of Care:  Improvement  Desire for further Chaplaincy support: no  3. Symptom Management:  Dyspnea/SOB/edema: Continue management by heart failure team. Home on milrinone today which has seemed to greatly improve symptoms.   Weakness: Consider home PT consult.   5. Prognosis: Unable to determine  5. Discharge Planning: Home with Home Health   Thank you for allowing the Palliative Medicine Team to assist in the care of this patient.   Time In: 1530 Time Out: 1550 Total Time Prolonged Time Billed  no     Greater than 50%  of this time was spent counseling and coordinating care related to the above assessment and plan.   Ulice Bold, NP  11/21/2014, 3:52 PM  Please contact Palliative Medicine Team phone at [phone] for questions and concerns.

## 2014-11-21 NOTE — Discharge Instructions (Signed)
NO TOBACCO. °

## 2014-11-21 NOTE — Care Management Note (Signed)
Case Management Note  Patient Details  Name: Christian Wall MRN: [id number] Date of Birth: Jun 23, 1961  Subjective/Objective:                    Action/Plan:   Expected Discharge Date:  11/15/14               Expected Discharge Plan:  Home w Home Health Services  In-House Referral:     Discharge planning Services  CM Consult  Post Acute Care Choice:    Choice offered to:     DME Arranged:  IV pump/equipment DME Agency:  [facility]  HH Arranged:  RN [facility] Agency:  [facility]  Status of Service:  Completed, signed off  Medicare Important Message Given:  Yes Date Medicare IM Given:  11/16/14 Medicare IM give by:  debbie Nahum Sherrer rn,bsn Date Additional Medicare IM Given:  11/20/14 Additional Medicare Important Message give by:  debbie Caroly Purewal rn,bsn  If discussed at Long Length of Stay Meetings, dates discussed:    Additional Comments:pt for dc home today 5/17, have alerted donna w [facility] of disch for today. For home iv milrinone  Hanley Hays, RN 11/21/2014, 11:51 AM

## 2014-11-23 NOTE — Progress Notes (Signed)
VAD Coordinator Note:   Christian Wall is a 54 y.o. with  has a past medical history of Diabetes mellitus; Atrophy of calf muscles; Depression; GERD (gastroesophageal reflux disease); Peripheral neuropathy; Hypercholesteremia; and tracheostomy. He also had a history of ETOH abuse 5 years ago. Was admitted recently with worsening renal function from [facility] hospital and found to be in cardiogenic shock with low output CHF; EF 20%. He was started inpatient on lasix and Milrinone; will be discharged home on Milrinone. Asked by Dr. Gala Romney to meet with [name] and discuss advanced therapies which include Left Ventricular Assist Device implantation and evaluation process.     Have had lengthy discussion with patient (his wife/caregiver was not present) about the indications for VAD therapy, caregiver role, complications, life style changes, follow up care, surgery details, and driveline management care. The patient was provided with educational resources to take home and read more about the device offered and discuss with his wife.   He did not consent to go through with evaluation at this time--I explained to him that the evaluation consent is NOT for the procedure, it is only intended to allow [place] to perform the necessary tests for the evaluation and that while he is inpatient it is easier to get the testing done more timely. He stated that he was not ready to make the decision to consider this therapy at this time since "everything has hit him fast and hard regarding these changes to his health." He requested more time to look at the information and discuss with his wife. He stated that "if this is the only option for me I will do it, but I hope the medicine works for me." I agreed that I hope it will work well for him but the team wanted to give him an idea at an alternative that could potentially offer him a longer term benefit if he was properly evaluated and deemed a good candidate for therapy.    I have  provided the patient with my contact information in order for them to call back if they have any questions and we will continue to follow along with him in the outpatient setting.   Rexene Alberts, RN VAD Coordinator   Office: [phone]  24/7 VAD Pager: [phone]

## 2014-11-28 ENCOUNTER — Telehealth (HOSPITAL_COMMUNITY): Payer: Self-pay | Admitting: *Deleted

## 2014-11-28 ENCOUNTER — Ambulatory Visit (HOSPITAL_COMMUNITY)
Admission: RE | Admit: 2014-11-28 | Discharge: 2014-11-28 | Disposition: A | Discharge status: Home / Self Care | Payer: Medicare Other | Source: Ambulatory Visit | Attending: Cardiology | Admitting: Cardiology

## 2014-11-28 ENCOUNTER — Encounter (HOSPITAL_COMMUNITY): Payer: Self-pay

## 2014-11-28 ENCOUNTER — Telehealth (HOSPITAL_COMMUNITY): Payer: Self-pay

## 2014-11-28 VITALS — BP 109/54 | HR 115 | Resp 18 | Wt 204.5 lb

## 2014-11-28 DIAGNOSIS — E1142 Type 2 diabetes mellitus with diabetic polyneuropathy: Secondary | ICD-10-CM | POA: Insufficient documentation

## 2014-11-28 DIAGNOSIS — R059 Cough, unspecified: Secondary | ICD-10-CM

## 2014-11-28 DIAGNOSIS — K219 Gastro-esophageal reflux disease without esophagitis: Secondary | ICD-10-CM | POA: Insufficient documentation

## 2014-11-28 DIAGNOSIS — R531 Weakness: Secondary | ICD-10-CM | POA: Insufficient documentation

## 2014-11-28 DIAGNOSIS — R05 Cough: Secondary | ICD-10-CM | POA: Insufficient documentation

## 2014-11-28 DIAGNOSIS — Z79899 Other long term (current) drug therapy: Secondary | ICD-10-CM | POA: Diagnosis not present

## 2014-11-28 DIAGNOSIS — E780 Pure hypercholesterolemia: Secondary | ICD-10-CM | POA: Insufficient documentation

## 2014-11-28 DIAGNOSIS — F17210 Nicotine dependence, cigarettes, uncomplicated: Secondary | ICD-10-CM | POA: Diagnosis not present

## 2014-11-28 DIAGNOSIS — R0600 Dyspnea, unspecified: Secondary | ICD-10-CM

## 2014-11-28 DIAGNOSIS — I5021 Acute systolic (congestive) heart failure: Secondary | ICD-10-CM

## 2014-11-28 DIAGNOSIS — I5020 Unspecified systolic (congestive) heart failure: Secondary | ICD-10-CM | POA: Insufficient documentation

## 2014-11-28 DIAGNOSIS — N183 Chronic kidney disease, stage 3 unspecified: Secondary | ICD-10-CM

## 2014-11-28 DIAGNOSIS — Z833 Family history of diabetes mellitus: Secondary | ICD-10-CM | POA: Diagnosis not present

## 2014-11-28 DIAGNOSIS — D649 Anemia, unspecified: Secondary | ICD-10-CM | POA: Diagnosis not present

## 2014-11-28 DIAGNOSIS — E1122 Type 2 diabetes mellitus with diabetic chronic kidney disease: Secondary | ICD-10-CM | POA: Insufficient documentation

## 2014-11-28 DIAGNOSIS — Z794 Long term (current) use of insulin: Secondary | ICD-10-CM | POA: Insufficient documentation

## 2014-11-28 DIAGNOSIS — Z720 Tobacco use: Secondary | ICD-10-CM | POA: Diagnosis not present

## 2014-11-28 LAB — CBC
HEMATOCRIT: 30.4 % — AB (ref 39.0–52.0)
Hemoglobin: 9.6 g/dL — ABNORMAL LOW (ref 13.0–17.0)
MCH: 21.7 pg — ABNORMAL LOW (ref 26.0–34.0)
MCHC: 31.6 g/dL (ref 30.0–36.0)
MCV: 68.8 fL — ABNORMAL LOW (ref 78.0–100.0)
Platelets: 325 10*3/uL (ref 150–400)
RBC: 4.42 MIL/uL (ref 4.22–5.81)
RDW: 21.8 % — AB (ref 11.5–15.5)
WBC: 11.5 10*3/uL — ABNORMAL HIGH (ref 4.0–10.5)

## 2014-11-28 LAB — CARBOXYHEMOGLOBIN
Carboxyhemoglobin: 3.3 % — ABNORMAL HIGH (ref 0.5–1.5)
Methemoglobin: 0.7 % (ref 0.0–1.5)
O2 Saturation: 69.9 %
TOTAL HEMOGLOBIN: 9.7 g/dL — AB (ref 13.5–18.0)

## 2014-11-28 LAB — BASIC METABOLIC PANEL
ANION GAP: 14 (ref 5–15)
BUN: 75 mg/dL — ABNORMAL HIGH (ref 6–20)
CO2: 21 mmol/L — ABNORMAL LOW (ref 22–32)
CREATININE: 3.18 mg/dL — AB (ref 0.61–1.24)
Calcium: 8.5 mg/dL — ABNORMAL LOW (ref 8.9–10.3)
Chloride: 95 mmol/L — ABNORMAL LOW (ref 101–111)
GFR calc Af Amer: 24 mL/min — ABNORMAL LOW (ref >60)
GFR, EST NON AFRICAN AMERICAN: 21 mL/min — AB (ref >60)
GLUCOSE: 107 mg/dL — AB (ref 65–99)
POTASSIUM: 6.1 mmol/L — AB (ref 3.5–5.1)
SODIUM: 130 mmol/L — AB (ref 135–145)

## 2014-11-28 LAB — BRAIN NATRIURETIC PEPTIDE: B NATRIURETIC PEPTIDE 5: 146.5 pg/mL — AB (ref 0.0–100.0)

## 2014-11-28 MED ORDER — DIGOXIN 125 MCG PO TABS
0.1250 mg | ORAL_TABLET | Freq: Every day | ORAL | Status: DC
Start: 2014-11-28 — End: 2015-02-04

## 2014-11-28 NOTE — Progress Notes (Signed)
Patient ID: Christian Wall, male   DOB: 16-Sep-1960, 54 y.o.   MRN: [id number] PCP: Primary Cardiologist:  HPI:  Christian Wall is a 54 yo male with h/o of polysubstance abuse, DM2, remote pancreatitis c/b prolonged hospitalization resulting in leg atrophy several years ago. Admitted in 5/16 with acute HF, cardiogenic shock and renal failure.   In 5/16, 2-D echocardiogram was performed and showed an EF of 20-25 percent. He was initially diuresed but developed cardiorenal syndrome and increasing creatinine (peak BUN/creatinine 72/3.22). Underwent RHC (LHC not done due to renal failure) which showed elevated filling pressures and cardiogenic shock. Started on milrinone and diuresed. Attempted to wean milrinone in hospital but this was unsuccessful. D/c weight 203.   Here for post-hospital f/u with his wife. Remains on milrinone 0.25. Has HHRN coming out. Says he feels much better since prior to admit. Unable to weigh due to his weak legs. HHRN measuring legs and abdomen and they are stable.  Has fallen twice due to weak legs though. C/o R leg pain and asking for pain meds. No dizziness. + chronic cough. No edema, orthopnea or PND.  Has chronic cough. HHRN unable to draw blood from PICC today  [place] 11/13/14 RA = 29 RV = 51/21/30 PA = 48/29 (38) PCW = 30 Fick cardiac output/index = 3.8/1.7 Thermo CO/CI = 3.7/1.7 PVR = 2.9 WU SVR = 1,502  Ao sat = 100% PA sat = 45%, 45% Ao = 131/86 (100)  Labs:  11/21/14 K 4.4 Creatinine 1.53  ROS: All systems negative except as listed in HPI, PMH and Problem List.  SH:  History   Social History  . Marital Status: Married    Spouse Name: N/A  . Number of Children: N/A  . Years of Education: N/A   Occupational History  . Not on file.   Social History Main Topics  . Smoking status: Current Every Day Smoker -- 1.00 packs/day for 30 years    Types: Cigarettes  . Smokeless tobacco: Never Used  . Alcohol Use: No     Comment: Quit after his brother's death in  09/16/2005 or 8   . Drug Use: No  . Sexual Activity: Yes   Other Topics Concern  . Not on file   Social History Narrative    FH:  Family History  Problem Relation Age of Onset  . Diabetes Mellitus II Sister   . CAD Brother   . Cancer Mother   . Cancer Father     Past Medical History  Diagnosis Date  . Diabetes mellitus   . Atrophy of calf muscles   . Depression   . GERD (gastroesophageal reflux disease)   . Peripheral neuropathy   . Hypercholesteremia   . Hx of tracheostomy     Current Outpatient Prescriptions  Medication Sig Dispense Refill  . acetaminophen (TYLENOL) 500 MG tablet Take 2 tablets (1,000 mg total) by mouth every 6 (six) hours as needed for mild pain or headache. 30 tablet 0  . benzonatate (TESSALON) 200 MG capsule Take 1 capsule (200 mg total) by mouth 3 (three) times daily. 30 capsule 0  . Blood Glucose Monitoring Suppl (CVS BLOOD GLUCOSE METER) W/DEVICE KIT Check blood sugars 4 times daily, with meals and at bedtime. 1 kit 0  . CVS VITAMIN B12 1000 MCG tablet Take 1,000 mcg by mouth daily.  5  . dextromethorphan-guaiFENesin (MUCINEX DM) 30-600 MG per 12 hr tablet Take 1 tablet by mouth 2 (two) times daily.    . DULoxetine (CYMBALTA)  60 MG capsule Take 60 mg by mouth daily.    Marland Kitchen esomeprazole (NEXIUM) 40 MG capsule Take 40 mg by mouth daily before breakfast.    . glucose blood (CVS BLOOD GLUCOSE TEST STRIPS) test strip Use as instructed 100 each 12  . hydrALAZINE (APRESOLINE) 25 MG tablet Take 3 tablets (75 mg total) by mouth every 8 (eight) hours. 270 tablet 11  . insulin aspart (NOVOLOG) 100 UNIT/ML injection Inject 0-15 Units into the skin 3 (three) times daily with meals. CBG 70 - 120: 0 units CBG 121 - 150: 2 units CBG 151 - 200: 3 units CBG 201 - 250: 5 units CBG 251 - 300: 8 units CBG 301 - 350: 11 units CBG 351 - 400: 15 units 10 mL 11  . insulin glargine (LANTUS) 100 UNIT/ML injection Inject 0.3-0.4 mLs (30-40 Units total) into the skin 2 (two) times  daily. 40 U am, 30 U pm 10 mL 11  . isosorbide mononitrate (IMDUR) 30 MG 24 hr tablet Take 1 tablet (30 mg total) by mouth 2 (two) times daily. 60 tablet 11  . lipase/protease/amylase (CREON-10/PANCREASE) 12000 UNITS CPEP Take 1-3 capsules by mouth 2 (two) times daily. 3 capsules before meal, and 1 capsule before snack    . milrinone (PRIMACOR) 20 MG/100ML SOLN infusion Inject 23.475 mcg/min into the vein continuous. 250 mL 12  . nicotine (NICODERM CQ - DOSED IN MG/24 HOURS) 14 mg/24hr patch Place 1 patch (14 mg total) onto the skin daily. 28 patch 0  . Olopatadine HCl (PATADAY) 0.2 % SOLN Apply 1 drop to eye daily as needed. For allergies    . potassium chloride 20 MEQ TBCR Take 20 mEq by mouth 2 (two) times daily. 60 tablet 11  . pregabalin (LYRICA) 150 MG capsule Take 150 mg by mouth 2 (two) times daily.    . simvastatin (ZOCOR) 10 MG tablet Take 10 mg by mouth daily at 6 PM.    . spironolactone (ALDACTONE) 25 MG tablet Take 0.5 tablets (12.5 mg total) by mouth daily. 30 tablet 11  . tiZANidine (ZANAFLEX) 4 MG tablet Take 4 mg by mouth 2 (two) times daily as needed for muscle spasms.    Marland Kitchen torsemide (DEMADEX) 20 MG tablet Take 1 tablet (20 mg total) by mouth 2 (two) times daily. 60 tablet 11  . traMADol (ULTRAM) 50 MG tablet Take 50 mg by mouth every 6 (six) hours as needed for moderate pain.      No current facility-administered medications for this encounter.    Filed Vitals:   11/28/14 1026  BP: 109/54  Pulse: 115  Resp: 18  Weight: 204 lb 8 oz (92.761 kg)  SpO2: 98%    PHYSICAL EXAM:  General:  Well appearing. No resp difficulty HEENT: normal Neck: supple. JVP flat. Carotids 2+ bilaterally; no bruits. No lymphadenopathy or thryomegaly appreciated. Cor: PMI laterally displaced. Regular tachy + s3  2/6 MR Lungs: clear Abdomen: soft, obese nontender, nondistended. No hepatosplenomegaly. No bruits or masses. Good bowel sounds. Extremities: no cyanosis, clubbing, rash, tr-1+ edema  R>L RUE PICC Neuro: alert & orientedx3, cranial nerves grossly intact. Moves all 4 extremities w/o difficulty. Weak in RLE  Affect pleasant.   ASSESSMENT & PLAN: 1. Chonic Systolic Heart Failure  --NYHA III on milrinone --Volume status looks good. But remains tachycardic with s3. Will add digoxin 0.125 daily --Check labs today including co-ox --Likely not candidate for VAD or Tx due to limited mobility with legs 2. CKD, stage 3 - recheck  BMET today 3. Chronic lower extremity weakness 4.  DM II 5. Anemia, iron-deficiency 6. Tobacco use -smoking just a few cigarettes. Encouraged to quit completely 7. Cough - likely has COPD. Refer to Pulmoanry   Will need to follow closesly. Check labs today and RTC in several weeks.   Total time spent 45 minutes. Over half that time spent discussing above.   Zionna Homewood,MD 5:35 PM

## 2014-11-28 NOTE — Telephone Encounter (Signed)
Patient's serum K today 6.1, per Shirlee Latch instructed patient to HOLD potassium and spironolactone until a BMET is redrawn this Thursday in our clinic.  Patient aware and agreeable.  Ave Filter

## 2014-11-28 NOTE — Patient Instructions (Signed)
Start Digoxin 0.125 mg daily  Labs today  You have been referred to Pulmonary  Your physician recommends that you schedule a follow-up appointment in: 3-4 weeks

## 2014-11-28 NOTE — Telephone Encounter (Signed)
Nurse with [facility] called asked for a copy of pts labs also wants to know if patient was taken off of lisinopril bc he has a "terrible cough" Labs faxed will call her tomorrow after i speak with Dr. Shirlee Latch about lisinopril

## 2014-11-30 ENCOUNTER — Ambulatory Visit (HOSPITAL_COMMUNITY)
Admission: RE | Admit: 2014-11-30 | Discharge: 2014-11-30 | Disposition: A | Discharge status: Home / Self Care | Payer: Medicare Other | Source: Ambulatory Visit | Attending: Cardiology | Admitting: Cardiology

## 2014-11-30 ENCOUNTER — Encounter (HOSPITAL_COMMUNITY): Payer: Self-pay

## 2014-11-30 VITALS — BP 82/48 | Wt 202.1 lb

## 2014-11-30 DIAGNOSIS — I5020 Unspecified systolic (congestive) heart failure: Secondary | ICD-10-CM | POA: Insufficient documentation

## 2014-11-30 DIAGNOSIS — I5022 Chronic systolic (congestive) heart failure: Secondary | ICD-10-CM

## 2014-11-30 LAB — BRAIN NATRIURETIC PEPTIDE: B Natriuretic Peptide: 129.1 pg/mL — ABNORMAL HIGH (ref 0.0–100.0)

## 2014-11-30 MED ORDER — TORSEMIDE 20 MG PO TABS: 20.0000 mg | ORAL_TABLET | Freq: Every day | ORAL | Status: DC

## 2014-11-30 NOTE — Patient Instructions (Addendum)
HOLD Torsemide tomorrow.  On Saturday, begin taking Torsemide 20mg  (1 tablet) once daily in the morning. If you begin having swelling/edema, you may take 1 extra tablet in the afternoon.  Continue to HOLD Potassium and Spironolactone. We will call you to further instruct you this afternoon based on your lab results today.  Do the following things EVERYDAY: 1) Weigh yourself in the morning before breakfast. Write it down and keep it in a log. 2) Take your medicines as prescribed 3) Eat low salt foods-Limit salt (sodium) to 2000 mg per day.  4) Stay as active as you can everyday 5) Limit all fluids for the day to less than 2 liters

## 2014-11-30 NOTE — Progress Notes (Signed)
Patient seen for critical labs to repeat BMET.  Noted that he had been feeling sluggish, his manual BP was 82/48, and weight was down 2.5 lbs since his visit with [place] two days ago.  Dr. Gala Romney in clinic made aware who examined patient and noted that he appeared "dry", neck veins down.  Orders made to hold toresmide x 1 day, then reduce to 20mg  once daily.  Patient also advised to continue holding sprironolactone and potassium until today's labs were resulted.  Aware and agreeable to plan.  Advised to call us if he continues to feel bad and/or if his BP readings at home do not improve.  Ave Filter

## 2014-12-05 ENCOUNTER — Telehealth (HOSPITAL_COMMUNITY): Payer: Self-pay | Admitting: *Deleted

## 2014-12-05 MED ORDER — SODIUM POLYSTYRENE SULFONATE 15 GM/60ML PO SUSP: 30.0000 g | Freq: Once | ORAL | Status: DC

## 2014-12-05 NOTE — Telephone Encounter (Signed)
Received labs drawn this AM and K is 6.6, bun 75, cr 2.20, discuss all with Dr Shirlee Latch, he would like pt to stop Cleda Daub and Lis/hctz as advised earlier and for pt to also take Kayexalate 30 grams today and recheck bmet in AM, called Corrie Dandy, RN and advised her of orders, rx sent in, she will advise pt to get and take today and will recheck bmet tomorrow

## 2014-12-05 NOTE — Telephone Encounter (Signed)
Mary, RN called she is seeing pt and helping with meds today and has found some med errors.  She states pt is still taking his Lis/hctz 20/12.5 mg daily and has continued taking his Cleda Daub as well.  BP is low today at 92 systolic.  Labs we drew last week were hemolyzed and order was cancelled, she did get labs and is going to have them run stat today to check kidney function and K level.  She has removed Lis/hctz and Cleda Daub from meds and is going to put them in another location.  She ask that we call her for med changes so she can make sure they are done, will call her back today with lab results

## 2014-12-08 ENCOUNTER — Telehealth (HOSPITAL_COMMUNITY): Payer: Self-pay | Admitting: Cardiology

## 2014-12-08 DIAGNOSIS — I5022 Chronic systolic (congestive) heart failure: Secondary | ICD-10-CM

## 2014-12-08 MED ORDER — MAGNESIUM OXIDE 400 (241.3 MG) MG PO TABS: 200.0000 mg | ORAL_TABLET | Freq: Two times a day (BID) | ORAL | Status: DC

## 2014-12-08 NOTE — Telephone Encounter (Signed)
Abnormal labs drawn 12/05/14 with [facility] Mag- 0.8 Per Amy Clegg, NP-C Pt should add MagOx 200 mg, one twice a day  Pt aware and voiced understanding

## 2014-12-12 ENCOUNTER — Institutional Professional Consult (permissible substitution): Payer: Medicare Other | Admitting: Emergency Medicine

## 2014-12-15 ENCOUNTER — Telehealth (HOSPITAL_COMMUNITY): Payer: Self-pay | Admitting: *Deleted

## 2014-12-15 NOTE — Telephone Encounter (Signed)
Received labs from [facility] 6/7 bun 33 cr 1.28 K 5.1 mag 1.0, per [name], NP verify if pt started mag-ox 6/3 if so increase to 400 mg Twice daily.  Called and spoke w/pt's [facility] RN Corrie Dandy, she states when she saw pt on 6/7 he had not yet started the mag-ox but wife was going to pick it up on Wed so pt should be on med now and she is going to see him again on Mon and repeat labs at that time, if pt is not on med she will let us know

## 2014-12-18 ENCOUNTER — Encounter: Payer: Self-pay | Admitting: Internal Medicine

## 2014-12-18 ENCOUNTER — Ambulatory Visit (INDEPENDENT_AMBULATORY_CARE_PROVIDER_SITE_OTHER): Payer: Medicare Other | Admitting: Internal Medicine

## 2014-12-18 VITALS — BP 120/56 | HR 114 | Ht 67.0 in | Wt 210.0 lb

## 2014-12-18 DIAGNOSIS — F17210 Nicotine dependence, cigarettes, uncomplicated: Secondary | ICD-10-CM

## 2014-12-18 DIAGNOSIS — R059 Cough, unspecified: Secondary | ICD-10-CM

## 2014-12-18 DIAGNOSIS — R05 Cough: Secondary | ICD-10-CM | POA: Diagnosis not present

## 2014-12-18 DIAGNOSIS — Z720 Tobacco use: Secondary | ICD-10-CM

## 2014-12-18 DIAGNOSIS — R058 Other specified cough: Secondary | ICD-10-CM

## 2014-12-18 MED ORDER — ACETAMINOPHEN-CODEINE #3 300-30 MG PO TABS: ORAL_TABLET | ORAL | Status: DC

## 2014-12-18 MED ORDER — METHYLPREDNISOLONE ACETATE 80 MG/ML IJ SUSP
120.0000 mg | Freq: Once | INTRAMUSCULAR | Status: AC
  Administered 2014-12-18: 120 mg via INTRAMUSCULAR

## 2014-12-18 NOTE — Progress Notes (Signed)
Subjective:    Patient ID: Christian Wall, male    DOB: [birth year],   MRN: [id number]  HPI  56 yobm active smoker with severe cough started around early May 2016 (though had chronic cough x months per hosp record eval by speech therapy) and acei stopped around Nov 28 2014 due to worsening renal failure and  referred to pulmonary clinic 12/18/2014 by Drs McClean/ Bensimon  Admit date: 11/12/2014 Discharge date: 11/21/2014  Admission Diagnoses: SOB  Discharge Diagnoses:  Principal Problem:  Acute systolic CHF (congestive heart failure), NYHA class 3 - discharge weight 207 pounds (242 pounds on admission) Active Problems:  CHF (congestive heart failure)  Essential hypertension, now with hypotension at times, therefore no beta blocker  Hypoglycemia  Chronic renal insufficiency  Diabetes mellitus type II, uncontrolled  Peripheral neuropathy  Acute renal failure - improved on current medications, reason for no Ace or ARB  Palliative care encounter  Weakness   Discharged Condition: good  Hospital Course: 54 yo male w/ hx nl EF 2015, remote pancreatitis, DM, ETOH abuse (quit > 5 year) admitted 05/09 w/ CHF. Limited mobility uses crutches.   2-D echocardiogram was performed and showed an EF of 20-25 percent. He was initially diuresed but developed cardiorenal syndrome and increasing creatinine (peak BUN/creatinine 72/3.22). He was initially at [facility], but was transferred to [facility] for further evaluation and treatment.  At [facility], he was managed by the CHF team. He had a right heart catheterization on 11/14/2013, results below. Swan Numbers RA 16 PAP 39/20 PCWP 18 CO/CI 5.7/2.6  RHC 11/15/2014.  RA = 29 RV = 51/21/30 PA = 48/29 (38) PCW = 30 Fick cardiac output/index = 3.8/1.7 Thermo CO/CI = 3.7/1.7 PVR = 2.9 WU SVR = 1,502  Ao sat = 100% PA sat = 45%, 45% Ao = 131/86 (100)  He was in cardiogenic shock with low output and elevated filling pressures. He was  started on milrinone and diuresed with IV Lasix. His urine output increased and his CO-OX improved. His magnesium was low at 1.5 and was supplemented. His discharge weight is 207 pounds. At home, because he is unable to stand without assistance, he will weigh himself on his home scale with his braces. He is to track his weight and report gains. The importance of a low sodium diabetic diet was reinforced. The importance of tobacco cessation was reinforced and his wife agrees to quit as well.   His diabetes medications were adjusted for better blood sugar control. Because of renal insufficiency, the metformin and Actos were discontinued. His Lantus doses were changed and knee is encouraged to follow-up with his primary care physician.  He was noted to be anemic, with a hemoglobin at approximately the same level it was when he left the hospital in May 2015. He was given one infusion of Feraheme and is to follow-up with primary care for this. Discharge CBC is below.  His renal function improved on the milrinone as did his oxygenation. The dose was decreased, but his oxygenation decreased so he is going home on 0.25 mcg/kg/m. The home health agency will manage this as an outpatient. Because of the renal insufficiency, an ARB or ACE inhibitor will not be prescribed. Additionally, his blood pressure is low at times and because of this, he is not on a beta blocker.  A palliative care consult was called to establish goals of care. He is encouraged to discuss his wishes and healthcare desires with his wife but currently is a full  code.  A speech pathology consult was called because of a cough that he has had problems with for months. It is productive at times. He is on Robitussin DM for this and was also started on [employer]. Speech pathology and not find any aspiration or other significant abnormality. Because of his GERD and history of esophageal candidiasis, he is encouraged to follow-up with Dr. Rhea Belton  with GI.   On 11/21/2014, he was seen by Dr. Gala Romney and all data were reviewed. His respiratory status is felt to be at baseline. He was seen by the VAD coordinator and given information on this procedure. He will have early follow-up in the office and is encouraged to refrain from smoking, stick to a low sodium diabetic diet, and do daily weights in addition to medication compliance. No further inpatient workup was indicated and he is considered stable for discharge, to follow-up as an outpatient.  12/18/2014 1st [facility] office visit/ Christian Wall   Chief Complaint  Patient presents with  . Pulmonary Consult    Referred by Dr. Shirlee Latch. Pt c/o cough for the past 3 wks. Cough is occ prod only in the am with white sputum. Cough is esp worse at night when he lies down. ACE stopped approx 10 days ago.   states definitely better since acei d/c'd (due to renal failure)   But still very hoarse/ wheezy esp at hs - takes ppi inconsistently c overt HB, has not tried inhalers   No obvious day to day or daytime variabilty or assoc  cp or chest tightness,   overt sinus    symptoms. No unusual exp hx or h/o childhood pna/ asthma or knowledge of premature birth.  . Also denies any obvious fluctuation of symptoms with weather or environmental changes or other aggravating or alleviating factors except as outlined above   Current Medications, Allergies, Complete Past Medical History, Past Surgical History, Family History, and Social History were reviewed in [employer] record.      Review of Systems  Constitutional: Negative for fever, chills, activity change, appetite change and unexpected weight change.  HENT: Negative for congestion, dental problem, postnasal drip, rhinorrhea, sneezing, sore throat, trouble swallowing and voice change.   Eyes: Negative for visual disturbance.  Respiratory: Positive for cough. Negative for choking and shortness of breath.   Cardiovascular: Negative  for chest pain and leg swelling.  Gastrointestinal: Negative for nausea, vomiting and abdominal pain.  Genitourinary: Negative for difficulty urinating.       Acid heartburn  Musculoskeletal: Negative for arthralgias.  Skin: Negative for rash.  Psychiatric/Behavioral: Negative for behavioral problems and confusion.       Objective:   Physical Exam   W/c bound very hoarse  bm nad  Wt Readings from Last 3 Encounters:  12/18/14 210 lb (95.255 kg)  11/30/14 202 lb 2 oz (91.683 kg)  11/28/14 204 lb 8 oz (92.761 kg)    Vital signs reviewed    HEENT: nl dentition, turbinates, and orophanx. Nl external ear canals without cough reflex   NECK :  without JVD/Nodes/TM/ nl carotid upstrokes bilaterally   LUNGS: no acc muscle use,  Difficult exam with lots of upper airway noise / pseudoweeze    CV:  RRR  no s3 or murmur or increase in P2, 1+ bilateral lower ext edema   ABD:  soft and nontender with nl excursion in the supine position. No bruits or organomegaly, bowel sounds nl  MS:  warm without deformities, calf tenderness, cyanosis  or clubbing  SKIN: warm and dry without lesions    NEURO:  alert, approp, no deficits    I personally reviewed images and agree with radiology impression as follows:  CXR:  11/12/14 Cardiomegaly and hyperinflation. Mild interstitial thickening in the basilar periphery could represent mild congestive failure.     Lab Results  Component Value Date   CREATININE 3.18* 11/28/2014   CREATININE 1.53* 11/21/2014   CREATININE 1.44* 11/20/2014        Assessment & Plan:

## 2014-12-18 NOTE — Patient Instructions (Addendum)
Nexium 40 mg Take 30-60 min before first meal of the day and pepcid ac 20 mg at bedtime   Depomedrol 120 mg IM in office  GERD (REFLUX)  is an extremely common cause of respiratory symptoms just like yours , many times with no obvious heartburn at all.    It can be treated with medication, but also with lifestyle changes including avoidance of late meals, elevation of the head of your bed (ideally with 6 inch  bed blocks) excessive alcohol, smoking cessation, and avoid fatty foods, chocolate, peppermint, colas, red wine, and acidic juices such as orange juice.  NO MINT OR MENTHOL PRODUCTS SO NO COUGH DROPS  USE SUGARLESS CANDY INSTEAD (Jolley ranchers or Stover's or Life Savers) or even ice chips will also do - the key is to swallow to prevent all throat clearing. NO OIL BASED VITAMINS - use powdered substitutes.   For cough try tessalon pearls up to every 4 hours and ok to supplement with tylenol #3 one every 4 hours if needs   If not 100% better in 4 weeks we need you back for further evaluation - if all better tell your friends  Late add : due to smoking status rec pfts at 4 weeks

## 2014-12-19 ENCOUNTER — Other Ambulatory Visit: Payer: Self-pay | Admitting: Internal Medicine

## 2014-12-19 ENCOUNTER — Encounter: Payer: Self-pay | Admitting: Internal Medicine

## 2014-12-19 ENCOUNTER — Telehealth (HOSPITAL_COMMUNITY): Payer: Self-pay

## 2014-12-19 DIAGNOSIS — F17210 Nicotine dependence, cigarettes, uncomplicated: Secondary | ICD-10-CM | POA: Insufficient documentation

## 2014-12-19 NOTE — Telephone Encounter (Signed)
Rep with [facility] contacted HF clinic to ask for copy of most recent labs sent in on patient by [facility] on 12/12/14.  Faxed to provided fax # [phone]  Ave Filter

## 2014-12-19 NOTE — Assessment & Plan Note (Addendum)
The most common causes of chronic cough in immunocompetent adults include the following: upper airway cough syndrome (UACS), previously referred to as postnasal drip syndrome (PNDS), which is caused by variety of rhinosinus conditions; (2) asthma; (3) GERD; (4) chronic bronchitis from cigarette smoking or other inhaled environmental irritants; (5) nonasthmatic eosinophilic bronchitis; and (6) bronchiectasis.   These conditions, singly or in combination, have accounted for up to 94% of the causes of chronic cough in prospective studies.   Other conditions have constituted no >6% of the causes in prospective studies These have included bronchogenic carcinoma, chronic interstitial pneumonia, sarcoidosis, left ventricular failure, ACEI-induced cough, and aspiration from a condition associated with pharyngeal dysfunction.    Chronic cough is often simultaneously caused by more than one condition. A single cause has been found from 38 to 82% of the time, multiple causes from 18 to 62%. Multiply caused cough has been the result of three diseases up to 42% of the time.       Based on hx and exam, this is most likely:  Classic Upper airway cough syndrome, so named because it's frequently impossible to sort out how much is  CR/sinusitis with freq throat clearing (which can be related to primary GERD)   vs  causing  secondary (" extra esophageal")  GERD from wide swings in gastric pressure that occur with throat clearing, often  promoting self use of mint and menthol lozenges that reduce the lower esophageal sphincter tone and exacerbate the problem further in a cyclical fashion.   These are the same pts (now being labeled as having "irritable larynx syndrome" by some cough centers) who not infrequently have a history of having failed to tolerate ace inhibitors,  dry powder inhalers or biphosphonates or report having atypical reflux symptoms that don't respond to standard doses of PPI , and are easily confused as  having aecopd or asthma flares by even experienced allergists/ pulmonologists.   The first step is to maximize acid suppression and eliminate  cyclical coughing then regroup with pft due to smoking hx as could also have cough variant asthma/ copd   See instructions for specific recommendations which were reviewed directly with the patient who was given a copy with highlighter outlining the key components.    Time spent 35 m counseling including cig issue

## 2014-12-19 NOTE — Assessment & Plan Note (Signed)
>   3 min discussion  I emphasized that although we never turn away smokers from the pulmonary clinic, we do ask that they understand that the recommendations that we make  won't work nearly as well in the presence of continued cigarette exposure.  In fact, we may very well  reach a point where we can't promise to help the patient if he/she can't quit smoking. (We can and will promise to try to help, we just can't promise what we recommend will really work)   Needs pfts once the "smoke also clears" from the chronic acei effect (another 4 weeks)

## 2014-12-21 ENCOUNTER — Telehealth (HOSPITAL_COMMUNITY): Payer: Self-pay | Admitting: Cardiology

## 2014-12-21 ENCOUNTER — Telehealth: Payer: Self-pay | Admitting: Internal Medicine

## 2014-12-21 MED ORDER — BENZONATATE 200 MG PO CAPS: ORAL_CAPSULE | ORAL | Status: DC

## 2014-12-21 MED ORDER — PREDNISONE 10 MG PO TABS: ORAL_TABLET | ORAL | Status: DC

## 2014-12-21 NOTE — Telephone Encounter (Signed)
Spoke with pt and advised of Dr Wert's recommendations.  Rx sent to pharmacy. 

## 2014-12-21 NOTE — Telephone Encounter (Signed)
Pt states that he is still coughing and neck is hurting worse.  He is taking Nexium in am and pepcid in pm and is taking tylenol #3 as directed but still coughing.  He states the never received rx for Tessalon from pharmacy.  Please advise.

## 2014-12-21 NOTE — Telephone Encounter (Signed)
Prednisone 10 mg take  4 each am x 2 days,   2 each am x 2 days,  1 each am x 2 days and stop Tessalon 200 qid #40 Should take up to 2 tyenol #3 every 4 hours if needed and see me first of week if not better with all meds in hand

## 2014-12-21 NOTE — Telephone Encounter (Signed)
ABNORMAL LABS LABS DRAWN 12/19/14 PER AMY CLEGG, NP PT SHOULD INCREASE MAG OX TO 400MG  BID AND CONFIRM IF PT IS TAKING MAG OX

## 2014-12-22 NOTE — Telephone Encounter (Signed)
Spoke with pt and gave instructions to increase mag ox to 400mg  bid (per amy) pt verbally stated that he understood medication change.

## 2014-12-22 NOTE — Telephone Encounter (Signed)
Attempted to call pt's [facility] at [phone] and left her a mess to call us back regarding pt's mag-ox

## 2014-12-25 ENCOUNTER — Other Ambulatory Visit (HOSPITAL_COMMUNITY): Payer: Self-pay | Admitting: *Deleted

## 2014-12-25 MED ORDER — MAGNESIUM OXIDE 400 MG PO TABS: 400.0000 mg | ORAL_TABLET | Freq: Two times a day (BID) | ORAL | Status: DC

## 2014-12-26 ENCOUNTER — Encounter (HOSPITAL_COMMUNITY): Payer: Self-pay

## 2014-12-26 ENCOUNTER — Telehealth: Payer: Self-pay | Admitting: Internal Medicine

## 2014-12-26 ENCOUNTER — Ambulatory Visit (HOSPITAL_COMMUNITY)
Admission: RE | Admit: 2014-12-26 | Discharge: 2014-12-26 | Disposition: A | Discharge status: Home / Self Care | Payer: Medicare Other | Source: Ambulatory Visit | Attending: Internal Medicine | Admitting: Internal Medicine

## 2014-12-26 ENCOUNTER — Telehealth (HOSPITAL_COMMUNITY): Payer: Self-pay | Admitting: *Deleted

## 2014-12-26 ENCOUNTER — Telehealth: Payer: Self-pay | Admitting: Physician Assistant

## 2014-12-26 VITALS — BP 124/76 | HR 92 | Wt 215.2 lb

## 2014-12-26 DIAGNOSIS — D509 Iron deficiency anemia, unspecified: Secondary | ICD-10-CM | POA: Insufficient documentation

## 2014-12-26 DIAGNOSIS — R05 Cough: Secondary | ICD-10-CM | POA: Insufficient documentation

## 2014-12-26 DIAGNOSIS — N183 Chronic kidney disease, stage 3 (moderate): Secondary | ICD-10-CM | POA: Diagnosis not present

## 2014-12-26 DIAGNOSIS — E1165 Type 2 diabetes mellitus with hyperglycemia: Secondary | ICD-10-CM

## 2014-12-26 DIAGNOSIS — I5020 Unspecified systolic (congestive) heart failure: Secondary | ICD-10-CM | POA: Diagnosis not present

## 2014-12-26 DIAGNOSIS — Z720 Tobacco use: Secondary | ICD-10-CM | POA: Insufficient documentation

## 2014-12-26 DIAGNOSIS — I5022 Chronic systolic (congestive) heart failure: Secondary | ICD-10-CM | POA: Diagnosis present

## 2014-12-26 DIAGNOSIS — R531 Weakness: Secondary | ICD-10-CM | POA: Insufficient documentation

## 2014-12-26 DIAGNOSIS — IMO0002 Reserved for concepts with insufficient information to code with codable children: Secondary | ICD-10-CM

## 2014-12-26 DIAGNOSIS — E119 Type 2 diabetes mellitus without complications: Secondary | ICD-10-CM | POA: Diagnosis not present

## 2014-12-26 DIAGNOSIS — F17210 Nicotine dependence, cigarettes, uncomplicated: Secondary | ICD-10-CM

## 2014-12-26 LAB — BRAIN NATRIURETIC PEPTIDE: B NATRIURETIC PEPTIDE 5: 1110.4 pg/mL — AB (ref 0.0–100.0)

## 2014-12-26 LAB — CARBOXYHEMOGLOBIN
CARBOXYHEMOGLOBIN: 4.5 % — AB (ref 0.5–1.5)
Methemoglobin: 0.8 % (ref 0.0–1.5)
O2 SAT: 70.1 %
Total hemoglobin: 11.4 g/dL — ABNORMAL LOW (ref 13.5–18.0)

## 2014-12-26 NOTE — Telephone Encounter (Signed)
This cough should be gone by July 1 but takes that long to improve after stopping the ACEi > options are to wait it out or return to clinic for stronger cough med (can't do over the phone)  If does come in before July 1: needs See Tammy NP  with all your medications, even over the counter meds, separated in two separate bags, the ones you take no matter what vs the ones you stop once you feel better and take only as needed when you feel you need them.   Tammy  will generate for you a new user friendly medication calendar that will put [place] all on the same page re: your medication use.     Without this process, it simply isn't possible to assure that we are providing  your outpatient care  with  the attention to detail we feel you deserve.   If we cannot assure that you're getting that kind of care,  then we cannot manage your problem effectively from this clinic.  Once you have seen Tammy and we are sure that we're all on the same page with your medication use she will arrange follow up with me.

## 2014-12-26 NOTE — Progress Notes (Signed)
Advanced Heart Failure Medication Review by a Pharmacist  Does the patient  feel that his/her medications are working for him/her?  yes  Has the patient been experiencing any side effects to the medications prescribed?  no  Does the patient measure his/her own blood pressure or blood glucose at home?  no   Does the patient have any problems obtaining medications due to transportation or finances?   no  Understanding of regimen: fair Understanding of indications: fair Potential of compliance: good    Pharmacist comments: Patient presents to HF clinic with his wife and mother and medications were reviewed with a pharmacist. He recently completed a prednisone taper and is s/p 1 dose of kayexalate last month for K of 6.1. Patient brings in refilled medications from [facility] and states that he wasn't sure what he should be taking. Informed him that he should not take his potassium since his K was high. He reported that he never started taking his Imdur or digoxin because he wasn't sure if he needed to - informed pt that he should be taking both of these. Also discussed smoking cessation.   Christian Wall E. Artia Singley, Pharm.D Clinical Pharmacy Resident Pager: [phone] 12/26/2014 10:52 AM

## 2014-12-26 NOTE — Progress Notes (Signed)
Patient ID: Christian Wall, male   DOB: 1961/01/24, 54 y.o.   MRN: [id number] PCP: Primary Cardiologist:Dr Bensimhon  HPI: Christian Wall is a 54 yo male with h/o of polysubstance abuse, DM2, remote pancreatitis c/b prolonged hospitalization resulting in leg atrophy several years ago. Admitted in 5/16 with acute HF, cardiogenic shock and renal failure.   In 5/16, 2-D echocardiogram was performed and showed an EF of 20-25 percent. He was initially diuresed but developed cardiorenal syndrome and increasing creatinine (peak BUN/creatinine 72/3.22). Underwent RHC (LHC not done due to renal failure) which showed elevated filling pressures and cardiogenic shock. Started on milrinone and diuresed. Attempted to wean milrinone in hospital but this was unsuccessful. D/c weight 203.   He returns for HF follow up. Last visit  potassium was high so lisinopril and spiro. He saw Dr Christian Wall on 6/14 and he was given prednisone for upper air way irritation. Has ongoing cough and wheeze.  Weight at home 210 pounds. Appetite ok and limits fluid intake. SOB with exertion. Taking all medications except digoxin and imdur.   [place] 11/13/14 RA = 29 RV = 51/21/30 PA = 48/29 (38) PCW = 30 Fick cardiac output/index = 3.8/1.7 Thermo CO/CI = 3.7/1.7 PVR = 2.9 WU SVR = 1,502  Ao sat = 100% PA sat = 45%, 45% Ao = 131/86 (100)  Labs:  11/21/14 K 4.4 Creatinine 1.53 12/19/2014: K 4.5 Creatinine 1.21 Mag 1.2   ROS: All systems negative except as listed in HPI, PMH and Problem List.  SH:  History   Social History  . Marital Status: Married    Spouse Name: N/A  . Number of Children: N/A  . Years of Education: N/A   Occupational History  . disabled    Social History Main Topics  . Smoking status: Current Every Day Smoker -- 1.00 packs/day for 30 years    Types: Cigarettes  . Smokeless tobacco: Never Used  . Alcohol Use: No     Comment: Quit after his brother's death in 2005-09-18 or 8   . Drug Use: No  . Sexual Activity: Yes    Other Topics Concern  . Not on file   Social History Narrative    FH:  Family History  Problem Relation Age of Onset  . Diabetes Mellitus II Sister   . CAD Brother   . Cancer Mother     ? type   . Cancer Father     ? type    Past Medical History  Diagnosis Date  . Diabetes mellitus   . Atrophy of calf muscles   . Depression   . GERD (gastroesophageal reflux disease)   . Peripheral neuropathy   . Hypercholesteremia   . Hx of tracheostomy     Current Outpatient Prescriptions  Medication Sig Dispense Refill  . acetaminophen (TYLENOL) 500 MG tablet Take 2 tablets (1,000 mg total) by mouth every 6 (six) hours as needed for mild pain or headache. 30 tablet 0  . acetaminophen-codeine (TYLENOL #3) 300-30 MG per tablet One every 4 hours as needed for cough 40 tablet 0  . benzonatate (TESSALON) 200 MG capsule Take 1 every 4 hours as needed for cough 40 capsule 0  . Blood Glucose Monitoring Suppl ([facility] BLOOD GLUCOSE METER) W/DEVICE KIT Check blood sugars 4 times daily, with meals and at bedtime. 1 kit 0  . [facility] VITAMIN B12 1000 MCG tablet Take 1,000 mcg by mouth daily.  5  . DULoxetine (CYMBALTA) 60 MG capsule Take 60 mg by mouth  daily.    . esomeprazole (NEXIUM) 40 MG capsule Take 40 mg by mouth daily before breakfast.    . glucose blood ([facility] BLOOD GLUCOSE TEST STRIPS) test strip Use as instructed 100 each 12  . hydrALAZINE (APRESOLINE) 25 MG tablet Take 3 tablets (75 mg total) by mouth every 8 (eight) hours. 270 tablet 11  . insulin aspart (NOVOLOG) 100 UNIT/ML injection Inject 0-15 Units into the skin 3 (three) times daily with meals. CBG 70 - 120: 0 units CBG 121 - 150: 2 units CBG 151 - 200: 3 units CBG 201 - 250: 5 units CBG 251 - 300: 8 units CBG 301 - 350: 11 units CBG 351 - 400: 15 units 10 mL 11  . insulin glargine (LANTUS) 100 UNIT/ML injection Inject 0.3-0.4 mLs (30-40 Units total) into the skin 2 (two) times daily. 40 U am, 30 U pm (Patient taking differently: Inject  30-40 Units into the skin 2 (two) times daily. 30 U am, 50 U pm) 10 mL 11  . lipase/protease/amylase (CREON-10/PANCREASE) 12000 UNITS CPEP Take 1-3 capsules by mouth 2 (two) times daily. 3 capsules before meal, and 1 capsule before snack    . magnesium oxide (MAG-OX) 400 MG tablet Take 1 tablet (400 mg total) by mouth 2 (two) times daily. 60 tablet 3  . milrinone (PRIMACOR) 20 MG/100ML SOLN infusion Inject 23.475 mcg/min into the vein continuous. 250 mL 12  . Olopatadine HCl (PATADAY) 0.2 % SOLN Apply 1 drop to eye daily as needed. For allergies    . pregabalin (LYRICA) 150 MG capsule Take 150 mg by mouth 2 (two) times daily.    . simvastatin (ZOCOR) 10 MG tablet Take 10 mg by mouth daily at 6 PM.    . tiZANidine (ZANAFLEX) 4 MG tablet Take 4 mg by mouth 2 (two) times daily as needed for muscle spasms.    Marland Kitchen torsemide (DEMADEX) 20 MG tablet Take 1 tablet (20 mg total) by mouth daily. May take 1 extra tablet in pm as needed for edema/swelling. 90 tablet 11  . traMADol (ULTRAM) 50 MG tablet Take 50 mg by mouth every 6 (six) hours as needed for moderate pain.     Marland Kitchen digoxin (LANOXIN) 0.125 MG tablet Take 1 tablet (0.125 mg total) by mouth daily. (Patient not taking: Reported on 12/26/2014) 30 tablet 3  . isosorbide mononitrate (IMDUR) 30 MG 24 hr tablet Take 1 tablet (30 mg total) by mouth 2 (two) times daily. (Patient not taking: Reported on 12/26/2014) 60 tablet 11  . nicotine (NICODERM CQ - DOSED IN MG/24 HOURS) 14 mg/24hr patch Place 1 patch (14 mg total) onto the skin daily. (Patient not taking: Reported on 12/26/2014) 28 patch 0  . predniSONE (DELTASONE) 10 MG tablet Take 4 tablets x 2 days, 2 tablets x 2 days, 1 tablet x 2 days then stop. (Patient not taking: Reported on 12/26/2014) 16 tablet 0  . sodium polystyrene (KAYEXALATE) 15 GM/60ML suspension Take 120 mLs (30 g total) by mouth once. (Patient not taking: Reported on 12/26/2014) 120 mL 0   No current facility-administered medications for this  encounter.    Filed Vitals:   12/26/14 1039  BP: 124/76  Pulse: 92  Weight: 215 lb 4 oz (97.637 kg)  SpO2: 95%    PHYSICAL EXAM:  General:  Well appearing. No resp difficulty. Arrived in wheel chair.  HEENT: normal Neck: supple. JVP ~10. Carotids 2+ bilaterally; no bruits. No lymphadenopathy or thryomegaly appreciated. Cor: PMI laterally displaced. Regular tachy +  s3  2/6 MR Lungs: clear Abdomen: soft, obese nontender, nondistended. No hepatosplenomegaly. No bruits or masses. Good bowel sounds. Extremities: no cyanosis, clubbing, rash, R and LLE 3+ edema.  RUE PICC Neuro: alert & orientedx3, cranial nerves grossly intact. Moves all 4 extremities w/o difficulty. Weak in RLE  Affect pleasant.   ASSESSMENT & PLAN: 1. Chonic Systolic Heart Failure  --NYHA III on milrinone 0.25 mcg via RUE PICC. CO-OX today 70%. Will not wean milrinone with volume on board.  --Volume status elevated. HH to give 80 mg IV lasix. Check BMET on Thursday.  -Will add digoxin 0.125 daily - Not on ace or spiro due to hyperkalemia  - Continue hydralazine 75 mg tid/imdur 30 mg bid.  --Likely not candidate for VAD or Tx due to limited mobility with legs 2. CKD, stage 3 - recheck BMET Thursday 3.  Chronic lower extremity weakness 4.  DM II 5. Anemia, iron-deficiency 6. Tobacco use -smoking just a few cigarettes. Encouraged to quit completely 7. Cough - likely has COPD. Refer to Pulmoanry   Will need to follow closely. Follow up next week to reassess volume status.    CLEGG,AMY, NP-C  11:01 AM

## 2014-12-26 NOTE — Telephone Encounter (Signed)
Called pt. He is aware of below and voiced understanding. He is going to wait and see how his coughs does. If not any better then will call us next week. Nothing further needed

## 2014-12-26 NOTE — Addendum Note (Signed)
Encounter addended by: Modesta Messing, CMA on: 12/26/2014  4:21 PM<BR>     Documentation filed: [employer], Orders

## 2014-12-26 NOTE — Telephone Encounter (Signed)
Pt reports he took prednisone as directed.  He reports he is still coughing (unsure color), some wheezing, SOB unchanged. He also reports the prednisone caused him gain weight and caused his BS to run high. Please advise MW thanks

## 2014-12-26 NOTE — Patient Instructions (Signed)
PLEASE START Digoxin 0.125mg  (1 tablet) daily.  START Imdur 30mg  (1 tablet) twice a day.  [facility] will give 80mg  of IV Lasix today and draw a bmet Thursday (12/28/14)  FOLLOW UP in 1 WEEK.

## 2014-12-26 NOTE — Telephone Encounter (Signed)
Home health called answering service to verify if patient definitely needed IV Lasix today as it won't be able to be administered until this evening. They said they received order at 4pm, delaying things a bit. They were wondering if they could go out with it instead tomorrow AM. Based on clinical description from today's office note and how complex patient is (on home milrinone), it sounds like this needed to be given today rather than waiting until tomorrow. HHRN verbalized understanding. Bridgit Eynon PA-C

## 2014-12-29 ENCOUNTER — Encounter: Payer: Self-pay | Admitting: Cardiology

## 2014-12-29 ENCOUNTER — Encounter: Payer: Self-pay | Admitting: Internal Medicine

## 2015-01-02 ENCOUNTER — Ambulatory Visit (HOSPITAL_COMMUNITY)
Admission: RE | Admit: 2015-01-02 | Discharge: 2015-01-02 | Disposition: A | Discharge status: Home / Self Care | Payer: Medicare Other | Source: Ambulatory Visit | Attending: Cardiology | Admitting: Cardiology

## 2015-01-02 ENCOUNTER — Encounter (HOSPITAL_COMMUNITY): Payer: Self-pay

## 2015-01-02 VITALS — BP 122/64 | HR 104 | Wt 205.8 lb

## 2015-01-02 DIAGNOSIS — Z79899 Other long term (current) drug therapy: Secondary | ICD-10-CM | POA: Insufficient documentation

## 2015-01-02 DIAGNOSIS — F329 Major depressive disorder, single episode, unspecified: Secondary | ICD-10-CM | POA: Diagnosis not present

## 2015-01-02 DIAGNOSIS — I5022 Chronic systolic (congestive) heart failure: Secondary | ICD-10-CM | POA: Insufficient documentation

## 2015-01-02 DIAGNOSIS — D509 Iron deficiency anemia, unspecified: Secondary | ICD-10-CM | POA: Diagnosis not present

## 2015-01-02 DIAGNOSIS — Z7952 Long term (current) use of systemic steroids: Secondary | ICD-10-CM | POA: Diagnosis not present

## 2015-01-02 DIAGNOSIS — Z720 Tobacco use: Secondary | ICD-10-CM

## 2015-01-02 DIAGNOSIS — F17210 Nicotine dependence, cigarettes, uncomplicated: Secondary | ICD-10-CM | POA: Insufficient documentation

## 2015-01-02 DIAGNOSIS — E119 Type 2 diabetes mellitus without complications: Secondary | ICD-10-CM | POA: Insufficient documentation

## 2015-01-02 DIAGNOSIS — Z794 Long term (current) use of insulin: Secondary | ICD-10-CM | POA: Diagnosis not present

## 2015-01-02 DIAGNOSIS — R531 Weakness: Secondary | ICD-10-CM | POA: Diagnosis not present

## 2015-01-02 DIAGNOSIS — R05 Cough: Secondary | ICD-10-CM | POA: Diagnosis not present

## 2015-01-02 DIAGNOSIS — N183 Chronic kidney disease, stage 3 unspecified: Secondary | ICD-10-CM

## 2015-01-02 DIAGNOSIS — E780 Pure hypercholesterolemia: Secondary | ICD-10-CM | POA: Diagnosis not present

## 2015-01-02 DIAGNOSIS — K219 Gastro-esophageal reflux disease without esophagitis: Secondary | ICD-10-CM | POA: Diagnosis not present

## 2015-01-02 LAB — CARBOXYHEMOGLOBIN
CARBOXYHEMOGLOBIN: 5.6 % — AB (ref 0.5–1.5)
Methemoglobin: 1.5 % (ref 0.0–1.5)
O2 Saturation: 57.3 %
Total hemoglobin: 10.4 g/dL — ABNORMAL LOW (ref 13.5–18.0)

## 2015-01-02 LAB — DIGOXIN LEVEL: Digoxin Level: 1.1 ng/mL (ref 0.8–2.0)

## 2015-01-02 MED ORDER — BUPROPION HCL ER (SR) 150 MG PO TB12: 150.0000 mg | ORAL_TABLET | Freq: Two times a day (BID) | ORAL | Status: DC

## 2015-01-02 MED ORDER — TORSEMIDE 20 MG PO TABS: ORAL_TABLET | ORAL | Status: DC

## 2015-01-02 NOTE — Patient Instructions (Signed)
START Wellbutrin 150mg  (1tablet) by mouth daily for 3 days.  Then TAKE 150 mg (1 tablet) twice a day for SMOKING CESSATION.  INCREASE Torsemide to 40mg  daily alternating with 20mg  daily.   FOLLOW UP in 10 days.

## 2015-01-02 NOTE — Progress Notes (Signed)
Patient ID: Christian Wall, male   DOB: 04-22-1961, 54 y.o.   MRN: [id number] PCP: Dr Jeanie Cooks Primary Cardiologist: Dr Haroldine Laws  HPI: Christian Wall is a 54 yo male with h/o of polysubstance abuse, DM2, remote pancreatitis c/b prolonged hospitalization resulting in leg atrophy (?critical care myopathy) several years ago. Admitted in 5/16 with acute HF, cardiogenic shock and renal failure.   In 5/16, 2-D echocardiogram was performed and showed an EF of 20-25 percent. He was initially diuresed but developed cardiorenal syndrome and increasing creatinine (peak BUN/creatinine 72/3.22). Underwent RHC (LHC not done due to renal failure) which showed elevated filling pressures and cardiogenic shock. Started on milrinone and diuresed. Attempted to wean milrinone in hospital but this was unsuccessful. D/c weight 203.   He returns for HF follow up. Last visit he was volume overloaded so home health gave 80 mg IV lasix. He is down 10 lbs from his last visit. Taking 20 mg of torsemide daily. He saw Dr Melvyn Novas on 6/14 and he was given prednisone for upper airway irritation. He still has ongoing cough and wheeze causing him discomfort. He is going back to see Dr Melvyn Novas. Weight at home 210 pounds. Appetite ok and limits fluid intake. Dyspnea with walking but better than before.  Very limited by weak legs/atrophy, uses leg braces. Still smoking 1/2 ppd.  [place] 11/13/14 RA = 29 RV = 51/21/30 PA = 48/29 (38) PCW = 30 Fick cardiac output/index = 3.8/1.7 Thermo CO/CI = 3.7/1.7 PVR = 2.9 WU SVR = 1,502  Ao sat = 100% PA sat = 45%, 45% Ao = 131/86 (100)  Labs:  11/21/14 K 4.4 Creatinine 1.53 12/19/2014: K 4.5 Creatinine 1.21 Mag 1.2  12/26/14 Co ox 70.1, BNP 1110.4 12/28/14 K 4.6, Creatinine 1.58, Mg 1.5 01/02/15 co-ox 57%  ECG: sinus tachy IVCD (156 msec)  ROS: All systems negative except as listed in HPI, PMH and Problem List.  SH:  History   Social History  . Marital Status: Married    Spouse Name: N/A  . Number of  Children: N/A  . Years of Education: N/A   Occupational History  . disabled    Social History Main Topics  . Smoking status: Current Every Day Smoker -- 1.00 packs/day for 30 years    Types: Cigarettes  . Smokeless tobacco: Never Used  . Alcohol Use: No     Comment: Quit after his brother's death in 2005-10-01 or 8   . Drug Use: No  . Sexual Activity: Yes   Other Topics Concern  . Not on file   Social History Narrative    FH:  Family History  Problem Relation Age of Onset  . Diabetes Mellitus II Sister   . CAD Brother   . Cancer Mother     ? type   . Cancer Father     ? type    Past Medical History  Diagnosis Date  . Diabetes mellitus   . Atrophy of calf muscles   . Depression   . GERD (gastroesophageal reflux disease)   . Peripheral neuropathy   . Hypercholesteremia   . Hx of tracheostomy     Current Outpatient Prescriptions  Medication Sig Dispense Refill  . acetaminophen (TYLENOL) 500 MG tablet Take 2 tablets (1,000 mg total) by mouth every 6 (six) hours as needed for mild pain or headache. 30 tablet 0  . acetaminophen-codeine (TYLENOL #3) 300-30 MG per tablet One every 4 hours as needed for cough 40 tablet 0  . benzonatate (TESSALON) 200  MG capsule Take 1 every 4 hours as needed for cough 40 capsule 0  . Blood Glucose Monitoring Suppl ([employer]S BLOOD GLUCOSE METER) W/DEVICE KIT Check blood sugars 4 times daily, with meals and at bedtime. 1 kit 0  . CVS VITAMIN B12 1000 MCG tablet Take 1,000 mcg by mouth daily.  5  . digoxin (LANOXIN) 0.125 MG tablet Take 1 tablet (0.125 mg total) by mouth daily. (Patient not taking: Reported on 12/26/2014) 30 tablet 3  . DULoxetine (CYMBALTA) 60 MG capsule Take 60 mg by mouth daily.    Marland Kitchen esomeprazole (NEXIUM) 40 MG capsule Take 40 mg by mouth daily before breakfast.    . glucose blood (CVS BLOOD GLUCOSE TEST STRIPS) test strip Use as instructed 100 each 12  . hydrALAZINE (APRESOLINE) 25 MG tablet Take 3 tablets (75 mg total) by mouth  every 8 (eight) hours. 270 tablet 11  . insulin aspart (NOVOLOG) 100 UNIT/ML injection Inject 0-15 Units into the skin 3 (three) times daily with meals. CBG 70 - 120: 0 units CBG 121 - 150: 2 units CBG 151 - 200: 3 units CBG 201 - 250: 5 units CBG 251 - 300: 8 units CBG 301 - 350: 11 units CBG 351 - 400: 15 units 10 mL 11  . insulin glargine (LANTUS) 100 UNIT/ML injection Inject 0.3-0.4 mLs (30-40 Units total) into the skin 2 (two) times daily. 40 U am, 30 U pm (Patient taking differently: Inject 30-40 Units into the skin 2 (two) times daily. 30 U am, 50 U pm) 10 mL 11  . isosorbide mononitrate (IMDUR) 30 MG 24 hr tablet Take 1 tablet (30 mg total) by mouth 2 (two) times daily. (Patient not taking: Reported on 12/26/2014) 60 tablet 11  . lipase/protease/amylase (CREON-10/PANCREASE) 12000 UNITS CPEP Take 1-3 capsules by mouth 2 (two) times daily. 3 capsules before meal, and 1 capsule before snack    . magnesium oxide (MAG-OX) 400 MG tablet Take 1 tablet (400 mg total) by mouth 2 (two) times daily. 60 tablet 3  . milrinone (PRIMACOR) 20 MG/100ML SOLN infusion Inject 23.475 mcg/min into the vein continuous. 250 mL 12  . nicotine (NICODERM CQ - DOSED IN MG/24 HOURS) 14 mg/24hr patch Place 1 patch (14 mg total) onto the skin daily. (Patient not taking: Reported on 12/26/2014) 28 patch 0  . Olopatadine HCl (PATADAY) 0.2 % SOLN Apply 1 drop to eye daily as needed. For allergies    . predniSONE (DELTASONE) 10 MG tablet Take 4 tablets x 2 days, 2 tablets x 2 days, 1 tablet x 2 days then stop. (Patient not taking: Reported on 12/26/2014) 16 tablet 0  . pregabalin (LYRICA) 150 MG capsule Take 150 mg by mouth 2 (two) times daily.    . simvastatin (ZOCOR) 10 MG tablet Take 10 mg by mouth daily at 6 PM.    . sodium polystyrene (KAYEXALATE) 15 GM/60ML suspension Take 120 mLs (30 g total) by mouth once. (Patient not taking: Reported on 12/26/2014) 120 mL 0  . tiZANidine (ZANAFLEX) 4 MG tablet Take 4 mg by mouth 2  (two) times daily as needed for muscle spasms.    Marland Kitchen torsemide (DEMADEX) 20 MG tablet Take 1 tablet (20 mg total) by mouth daily. May take 1 extra tablet in pm as needed for edema/swelling. 90 tablet 11  . traMADol (ULTRAM) 50 MG tablet Take 50 mg by mouth every 6 (six) hours as needed for moderate pain.      No current facility-administered medications for this  encounter.   BP 122/64 mmHg  Pulse 104  Wt 205 lb 12 oz (93.328 kg)  SpO2 93%  PHYSICAL EXAM:  General:  Well appearing. No resp difficulty. Arrived in wheel chair.  HEENT: normal Neck: supple. JVP 8 cm with hepatojugular reflux. Carotids 2+ bilaterally; no bruits. No lymphadenopathy or thryomegaly appreciated. Cor: PMI laterally displaced. Regular, mildly tachycardic, no S3/S4, 2/6 MR Lungs: clear Abdomen: soft, obese nontender, nondistended. No hepatosplenomegaly. No bruits or masses. Good bowel sounds. Extremities: no cyanosis, clubbing, rash, R and LLE 1-2+ edema 1/3 to knees bilaterally.  RUE PICC Neuro: alert & orientedx3, cranial nerves grossly intact. Moves all 4 extremities w/o difficulty. Weak in bilateral legs.  Affect pleasant.  ASSESSMENT & PLAN: 1. 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  NYHA III on milrinone 0.25 mcg via RUE PICC. EF 20-25% by echo.  Volume and symptoms improved compared to last visit but still some volume overload.  - Increase torsemide to 40 daily alternating with 20 daily.  BMET in 10 days.  - Co-ox was checked today and was found to be 57%.  I will not titrate down milrinone today.  Followup in 2 wks and can repeat co-ox at that time.  - Continue digoxin 0.125 daily, check level.  - Not on ACEI or spironolactone due to hyperkalemia  - Continue hydralazine 75 mg tid/imdur 30 mg bid.  - Creatinine seems to be improving.  Would consider coronary angiography in the near future as we have never ruled out CAD/ischemic cardiomyopathy.  - Will need repeat echo in 3 months.   At that time, will need to decide on CRT-D versus ICD (QRS 156 msec but IVCD and not true LBBB).  - Likely not candidate for VAD or transplant due to limited mobility with legs 2. CKD, stage 3: Weekly BMETs for now while on milrinone.  3.  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  4.  DM II 5. Anemia, iron-deficiency 6. Tobacco use: smoking 1/2 ppd. Encouraged to quit completely. - Start Wellbutrin 150 mg x 3 days, then 150 mg BID.  Spoke with in house pharmacist and potential for reactivity with Duloxetine low.  Will follow up closely along with clinic visits in the coming weeks. 7. Cough: likely has COPD. Being seen now by pulmonary.   Loralie Champagne 01/02/2015

## 2015-01-03 ENCOUNTER — Other Ambulatory Visit: Payer: Self-pay | Admitting: Internal Medicine

## 2015-01-05 ENCOUNTER — Telehealth (HOSPITAL_COMMUNITY): Payer: Self-pay

## 2015-01-05 ENCOUNTER — Encounter (HOSPITAL_COMMUNITY): Payer: Self-pay

## 2015-01-05 ENCOUNTER — Ambulatory Visit (HOSPITAL_COMMUNITY)
Admission: RE | Admit: 2015-01-05 | Discharge: 2015-01-05 | Disposition: A | Discharge status: Home / Self Care | Payer: Medicare Other | Source: Ambulatory Visit | Attending: Internal Medicine | Admitting: Internal Medicine

## 2015-01-05 VITALS — Wt 199.2 lb

## 2015-01-05 DIAGNOSIS — I5020 Unspecified systolic (congestive) heart failure: Secondary | ICD-10-CM | POA: Insufficient documentation

## 2015-01-05 DIAGNOSIS — I5022 Chronic systolic (congestive) heart failure: Secondary | ICD-10-CM | POA: Diagnosis not present

## 2015-01-05 LAB — CARBOXYHEMOGLOBIN
Carboxyhemoglobin: 7.5 % (ref 0.5–1.5)
Methemoglobin: 1 % (ref 0.0–1.5)
O2 Saturation: 70.2 %
TOTAL HEMOGLOBIN: 10.7 g/dL — AB (ref 13.5–18.0)

## 2015-01-05 NOTE — Telephone Encounter (Signed)
Results of co-oc reviewed with patient.  Advised that carboxyhgb was elevated and had slowly trended up and to get a carbon monoxide detector for the home to see if this is where the source could be coming from.  May explain cough he has had for a few weeks now.  Patient aware and agreeable.  Patient also mentioned being weaned down on the milrinone drip to be eventually discontinued, will forward this to provider to see if this is appropriate.  If so, will forward to advanced home care and have coox rechecked in 1 week.  Ave Filter

## 2015-01-10 ENCOUNTER — Telehealth (HOSPITAL_COMMUNITY): Payer: Self-pay | Admitting: *Deleted

## 2015-01-10 ENCOUNTER — Other Ambulatory Visit: Payer: Self-pay | Admitting: Internal Medicine

## 2015-01-10 ENCOUNTER — Ambulatory Visit (HOSPITAL_COMMUNITY)
Admission: RE | Admit: 2015-01-10 | Discharge: 2015-01-10 | Disposition: A | Discharge status: Home / Self Care | Payer: Medicare Other | Source: Ambulatory Visit | Attending: Cardiology | Admitting: Cardiology

## 2015-01-10 ENCOUNTER — Encounter (HOSPITAL_COMMUNITY): Payer: Self-pay

## 2015-01-10 VITALS — BP 118/72 | HR 101 | Wt 192.2 lb

## 2015-01-10 DIAGNOSIS — K219 Gastro-esophageal reflux disease without esophagitis: Secondary | ICD-10-CM | POA: Insufficient documentation

## 2015-01-10 DIAGNOSIS — I5022 Chronic systolic (congestive) heart failure: Secondary | ICD-10-CM | POA: Diagnosis not present

## 2015-01-10 DIAGNOSIS — D649 Anemia, unspecified: Secondary | ICD-10-CM | POA: Insufficient documentation

## 2015-01-10 DIAGNOSIS — E780 Pure hypercholesterolemia: Secondary | ICD-10-CM | POA: Insufficient documentation

## 2015-01-10 DIAGNOSIS — Z79899 Other long term (current) drug therapy: Secondary | ICD-10-CM | POA: Diagnosis not present

## 2015-01-10 DIAGNOSIS — E1142 Type 2 diabetes mellitus with diabetic polyneuropathy: Secondary | ICD-10-CM | POA: Insufficient documentation

## 2015-01-10 DIAGNOSIS — Z833 Family history of diabetes mellitus: Secondary | ICD-10-CM | POA: Diagnosis not present

## 2015-01-10 DIAGNOSIS — Z794 Long term (current) use of insulin: Secondary | ICD-10-CM | POA: Insufficient documentation

## 2015-01-10 DIAGNOSIS — N183 Chronic kidney disease, stage 3 (moderate): Secondary | ICD-10-CM | POA: Insufficient documentation

## 2015-01-10 DIAGNOSIS — Z8249 Family history of ischemic heart disease and other diseases of the circulatory system: Secondary | ICD-10-CM | POA: Diagnosis not present

## 2015-01-10 DIAGNOSIS — R531 Weakness: Secondary | ICD-10-CM | POA: Insufficient documentation

## 2015-01-10 DIAGNOSIS — F17210 Nicotine dependence, cigarettes, uncomplicated: Secondary | ICD-10-CM | POA: Insufficient documentation

## 2015-01-10 DIAGNOSIS — R05 Cough: Secondary | ICD-10-CM | POA: Insufficient documentation

## 2015-01-10 DIAGNOSIS — E1122 Type 2 diabetes mellitus with diabetic chronic kidney disease: Secondary | ICD-10-CM | POA: Diagnosis not present

## 2015-01-10 LAB — CARBOXYHEMOGLOBIN
Carboxyhemoglobin: 6.6 % (ref 0.5–1.5)
METHEMOGLOBIN: 1.4 % (ref 0.0–1.5)
O2 Saturation: 75.2 %
TOTAL HEMOGLOBIN: 10.9 g/dL — AB (ref 13.5–18.0)

## 2015-01-10 MED ORDER — MILRINONE IN DEXTROSE 20 MG/100ML IV SOLN: 0.1250 ug/kg/min | INTRAVENOUS | Status: DC

## 2015-01-10 NOTE — Telephone Encounter (Signed)
Notes Recorded by Noralee Space, RN on 01/10/2015 at 5:00 PM Per [name], NP decrease Milrinone to 0.125 mcg/kg/min, order faxed to [facility] to decrease, pt is sch for repeat co-ox 7/13

## 2015-01-10 NOTE — Progress Notes (Signed)
Patient ID: Christian Wall, male   DOB: 07-Feb-1961, 54 y.o.   MRN: [id number] PCP: Dr Jeanie Cooks Primary Cardiologist: Dr Haroldine Laws  HPI: Christian Wall is a 54 yo male with h/o of polysubstance abuse, DM2, remote pancreatitis c/b prolonged hospitalization resulting in leg atrophy (?critical care myopathy) several years ago. Admitted in 5/16 with acute HF, cardiogenic shock and renal failure.   In 5/16, 2-D echocardiogram was performed and showed an EF of 20-25 percent. He was initially diuresed but developed cardiorenal syndrome and increasing creatinine (peak BUN/creatinine 72/3.22). Underwent RHC (LHC not done due to renal failure) which showed elevated filling pressures and cardiogenic shock. Started on milrinone and diuresed. Attempted to wean milrinone in hospital but this was unsuccessful. D/C weight 203.   He returns for HF follow up. Last visit torsemide was increased 40 mg and alternate with 20 mg . Persistent cough. Unable to weigh due to balance.  Very limited by weak legs/atrophy, uses leg braces. Still smoking 1/2 ppd. Taking all medications. [facility] following for home milrinone.   [place] 11/13/14 RA = 29 RV = 51/21/30 PA = 48/29 (38) PCW = 30 Fick cardiac output/index = 3.8/1.7 Thermo CO/CI = 3.7/1.7 PVR = 2.9 WU SVR = 1,502  Ao sat = 100% PA sat = 45%, 45% Ao = 131/86 (100)  Labs:  11/21/14 K 4.4 Creatinine 1.53 12/19/2014: K 4.5 Creatinine 1.21 Mag 1.2  12/26/14 Co ox 70.1, BNP 1110.4 12/28/14 K 4.6, Creatinine 1.58, Mg 1.5 01/02/15 co-ox 57% Mag 1.1 Creatinine 1.47  01/2015: K 3.8 Creatinine 1.68 mag 1.1.  Co-ox 75%.   ROS: All systems negative except as listed in HPI, PMH and Problem List.  SH:  History   Social History  . Marital Status: Married    Spouse Name: N/A  . Number of Children: N/A  . Years of Education: N/A   Occupational History  . disabled    Social History Main Topics  . Smoking status: Current Every Day Smoker -- 1.00 packs/day for 30 years    Types: Cigarettes   . Smokeless tobacco: Never Used  . Alcohol Use: No     Comment: Quit after his brother's death in 09-12-2005 or 8   . Drug Use: No  . Sexual Activity: Yes   Other Topics Concern  . Not on file   Social History Narrative    FH:  Family History  Problem Relation Age of Onset  . Diabetes Mellitus II Sister   . CAD Brother   . Cancer Mother     ? type   . Cancer Father     ? type    Past Medical History  Diagnosis Date  . Diabetes mellitus   . Atrophy of calf muscles   . Depression   . GERD (gastroesophageal reflux disease)   . Peripheral neuropathy   . Hypercholesteremia   . Hx of tracheostomy     Current Outpatient Prescriptions  Medication Sig Dispense Refill  . acetaminophen (TYLENOL) 500 MG tablet Take 2 tablets (1,000 mg total) by mouth every 6 (six) hours as needed for mild pain or headache. 30 tablet 0  . acetaminophen-codeine (TYLENOL #3) 300-30 MG per tablet One every 4 hours as needed for cough 40 tablet 0  . benzonatate (TESSALON) 200 MG capsule TAKE 1 EVERY 4 HOURS AS NEEDED FOR COUGH 40 capsule 0  . benzonatate (TESSALON) 200 MG capsule TAKE 1 EVERY 4 HOURS AS NEEDED FOR COUGH 40 capsule 0  . Blood Glucose Monitoring Suppl (CVS BLOOD  GLUCOSE METER) W/DEVICE KIT Check blood sugars 4 times daily, with meals and at bedtime. 1 kit 0  . buPROPion (WELLBUTRIN SR) 150 MG 12 hr tablet Take 1 tablet (150 mg total) by mouth 2 (two) times daily. 60 tablet 3  . CVS VITAMIN B12 1000 MCG tablet Take 1,000 mcg by mouth daily.  5  . digoxin (LANOXIN) 0.125 MG tablet Take 1 tablet (0.125 mg total) by mouth daily. 30 tablet 3  . DULoxetine (CYMBALTA) 60 MG capsule Take 60 mg by mouth daily.    Marland Kitchen esomeprazole (NEXIUM) 40 MG capsule Take 40 mg by mouth daily before breakfast.    . glucose blood (CVS BLOOD GLUCOSE TEST STRIPS) test strip Use as instructed 100 each 12  . hydrALAZINE (APRESOLINE) 25 MG tablet Take 3 tablets (75 mg total) by mouth every 8 (eight) hours. 270 tablet 11  .  insulin aspart (NOVOLOG) 100 UNIT/ML injection Inject 0-15 Units into the skin 3 (three) times daily with meals. CBG 70 - 120: 0 units CBG 121 - 150: 2 units CBG 151 - 200: 3 units CBG 201 - 250: 5 units CBG 251 - 300: 8 units CBG 301 - 350: 11 units CBG 351 - 400: 15 units 10 mL 11  . insulin glargine (LANTUS) 100 UNIT/ML injection Inject 0.3-0.4 mLs (30-40 Units total) into the skin 2 (two) times daily. 40 U am, 30 U pm (Patient taking differently: Inject 30-40 Units into the skin 2 (two) times daily. 30 U am, 50 U pm) 10 mL 11  . isosorbide mononitrate (IMDUR) 30 MG 24 hr tablet Take 1 tablet (30 mg total) by mouth 2 (two) times daily. 60 tablet 11  . lipase/protease/amylase (CREON-10/PANCREASE) 12000 UNITS CPEP Take 1-3 capsules by mouth 2 (two) times daily. 3 capsules before meal, and 1 capsule before snack    . magnesium oxide (MAG-OX) 400 MG tablet Take 1 tablet (400 mg total) by mouth 2 (two) times daily. 60 tablet 3  . milrinone (PRIMACOR) 20 MG/100ML SOLN infusion Inject 23.475 mcg/min into the vein continuous. 250 mL 12  . Olopatadine HCl (PATADAY) 0.2 % SOLN Apply 1 drop to eye daily as needed. For allergies    . pregabalin (LYRICA) 150 MG capsule Take 150 mg by mouth 2 (two) times daily.    . simvastatin (ZOCOR) 10 MG tablet Take 10 mg by mouth daily at 6 PM.    . tiZANidine (ZANAFLEX) 4 MG tablet Take 4 mg by mouth 2 (two) times daily as needed for muscle spasms.    Marland Kitchen torsemide (DEMADEX) 20 MG tablet Take 31m daily alternating with 234mdaily. 90 tablet 11  . traMADol (ULTRAM) 50 MG tablet Take 50 mg by mouth every 6 (six) hours as needed for moderate pain.      No current facility-administered medications for this encounter.   BP 118/72 mmHg  Pulse 101  Wt 192 lb 4 oz (87.204 kg)  SpO2 94%  PHYSICAL EXAM:  General:  Well appearing. No resp difficulty. Arrived in wheel chair.  HEENT: normal Neck: supple. JVP 6-7 cm with hepatojugular reflux. Carotids 2+ bilaterally; no  bruits. No lymphadenopathy or thryomegaly appreciated. Cor: PMI laterally displaced. Regular, mildly tachycardic, no S3/S4, 2/6 MR Lungs: Rhonchi bilaterally Abdomen: soft, obese nontender, nondistended. No hepatosplenomegaly. No bruits or masses. Good bowel sounds. Extremities: no cyanosis, clubbing, rash, R and LLE  Trace edema.  RUE PICC Neuro: alert & orientedx3, cranial nerves grossly intact. Moves all 4 extremities w/o difficulty. Weak in  bilateral legs.  Affect pleasant.  ASSESSMENT & PLAN: 1. 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  NYHA III on milrinone 0.25 mcg/kg/min via RUE PICC. EF 20-25% by echo.  Volume status stable.  - Continue torsemide to 40 daily alternating with 20 daily.   - Co-ox 75%, decrease milrinone to 0.125 mcg/kg/min and repeat co-ox in 1 week. If we are able to wean milrinone will consider bisoprolol   - Continue digoxin 0.125 daily, check level.  - Not on ACEI or spironolactone due to hyperkalemia  - Continue hydralazine 75 mg tid/imdur 30 mg bid.  -  Would consider coronary angiography in the near future as we have never ruled out CAD/ischemic cardiomyopathy.  - Will need repeat echo in 3 months.  At that time, will need to decide on CRT-D versus ICD (QRS 156 msec but IVCD and not true LBBB).  - Likely not candidate for VAD or transplant due to limited mobility with legs 2. CKD, stage 3: Weekly BMETs for now while on milrinone.  3. 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  4.  DM II 5. Anemia, iron-deficiency 6. Tobacco use: Smoked over 30 years. Continues to smoke 1/2 ppd. Encouraged to quit completely. - Plan to start wellbutrin but has not started. I have asked him to start taking again. Wellbutrin 150 mg BID.   7. Cough: likely has COPD, extensive rhonchi on exam.  Needs PFTs. Being seen now by pulmonary.  8. Hypomagnesium: Mag level 1.1 I have asked him to take mag  ox 400 mg twice a day.   Follow up 2 weeks for CO-OX   CLEGG,AMY NP-C  01/10/2015   Patient seen with NP, agree with the above note.  Main complaint is chronic cough.  He is already on PPI without relief.  Volume status looks ok so doubt it is due to a marked fluid excess.  He is seeing pulmonary, cough may be related to COPD.  He has rhonchi on lung exam.  He will need PFTs and suspect treatment with inhalers.  He can try Robitussin DM.   Co-ox 75%, will decrease milrinone to 0.125 mcg/kg/min. BMET today.   Loralie Champagne 01/10/2015

## 2015-01-10 NOTE — Patient Instructions (Addendum)
Can take Robitussin DM for cough  PLEASE MAKE SURE YOU ARE TAKING MAG-OX 400 MG Twice daily   Labs in 2 weeks  Your physician recommends that you schedule a follow-up appointment in: 1 month

## 2015-01-17 ENCOUNTER — Encounter: Payer: Self-pay | Admitting: Internal Medicine

## 2015-01-22 ENCOUNTER — Ambulatory Visit (INDEPENDENT_AMBULATORY_CARE_PROVIDER_SITE_OTHER): Payer: Medicare Other | Admitting: Internal Medicine

## 2015-01-22 ENCOUNTER — Encounter: Payer: Self-pay | Admitting: Internal Medicine

## 2015-01-22 ENCOUNTER — Telehealth (HOSPITAL_COMMUNITY): Payer: Self-pay

## 2015-01-22 VITALS — BP 106/60 | HR 98 | Ht 67.0 in | Wt 189.0 lb

## 2015-01-22 DIAGNOSIS — R05 Cough: Secondary | ICD-10-CM

## 2015-01-22 DIAGNOSIS — F17210 Nicotine dependence, cigarettes, uncomplicated: Secondary | ICD-10-CM

## 2015-01-22 DIAGNOSIS — Z720 Tobacco use: Secondary | ICD-10-CM

## 2015-01-22 DIAGNOSIS — R058 Other specified cough: Secondary | ICD-10-CM

## 2015-01-22 LAB — PULMONARY FUNCTION TEST
DL/VA % PRED: 85 %
DL/VA: 3.78 ml/min/mmHg/L
DLCO UNC % PRED: 56 %
DLCO UNC: 15.98 ml/min/mmHg
FEF 25-75 POST: 0.79 L/s
FEF 25-75 Pre: 1.65 L/sec
FEF2575-%Change-Post: -51 %
FEF2575-%PRED-POST: 27 %
FEF2575-%PRED-PRE: 56 %
FEV1-%CHANGE-POST: -13 %
FEV1-%Pred-Post: 62 %
FEV1-%Pred-Pre: 71 %
FEV1-PRE: 2.1 L
FEV1-Post: 1.83 L
FEV1FVC-%CHANGE-POST: -4 %
FEV1FVC-%Pred-Pre: 96 %
FEV6-%Change-Post: -9 %
FEV6-%PRED-PRE: 75 %
FEV6-%Pred-Post: 68 %
FEV6-Post: 2.44 L
FEV6-Pre: 2.71 L
FEV6FVC-%CHANGE-POST: -1 %
FEV6FVC-%PRED-POST: 100 %
FEV6FVC-%Pred-Pre: 101 %
FVC-%Change-Post: -8 %
FVC-%PRED-POST: 67 %
FVC-%PRED-PRE: 74 %
FVC-Post: 2.51 L
FVC-Pre: 2.75 L
POST FEV6/FVC RATIO: 97 %
Post FEV1/FVC ratio: 73 %
Pre FEV1/FVC ratio: 76 %
Pre FEV6/FVC Ratio: 98 %
RV % PRED: 31 %
RV: 0.62 L
TLC % PRED: 65 %
TLC: 4.16 L

## 2015-01-22 NOTE — Progress Notes (Signed)
Subjective:    Patient ID: Christian Wall, male    DOB: September 24, 1960,   MRN: [id number]    Brief patient profile:  80 yobm active smoker with severe cough started around early May 2016 (though had chronic cough x months per hosp record eval by speech therapy) and acei stopped around Nov 28 2014 due to worsening renal failure and  referred to pulmonary clinic 12/18/2014 by Drs McClean/ Bensimon  Admit date: 11/12/2014 Discharge date: 11/21/2014  Admission Diagnoses: SOB  Discharge Diagnoses:  Principal Problem:  Acute systolic CHF (congestive heart failure), NYHA class 3 - discharge weight 207 pounds (242 pounds on admission) Active Problems:  CHF (congestive heart failure)  Essential hypertension, now with hypotension at times, therefore no beta blocker  Hypoglycemia  Chronic renal insufficiency  Diabetes mellitus type II, uncontrolled  Peripheral neuropathy  Acute renal failure - improved on current medications, reason for no Ace or ARB  Palliative care encounter  Weakness   Discharged Condition: good  Hospital Course: 54 yo male w/ hx nl EF 2015, remote pancreatitis, DM, ETOH abuse (quit > 5 year) admitted 11/12/13 w/ CHF. Limited mobility uses crutches.   2-D echocardiogram was performed and showed an EF of 20-25 percent. He was initially diuresed but developed cardiorenal syndrome and increasing creatinine (peak BUN/creatinine 72/3.22). He was initially at [facility], but was transferred to [facility] for further evaluation and treatment.  At [facility], he was managed by the CHF team. He had a right heart catheterization on 11/14/2013, results below. Swan Numbers RA 16 PAP 39/20 PCWP 18 CO/CI 5.7/2.6  RHC 11/15/2014.  RA = 29 RV = 51/21/30 PA = 48/29 (38) PCW = 30 Fick cardiac output/index = 3.8/1.7 Thermo CO/CI = 3.7/1.7 PVR = 2.9 WU SVR = 1,502  Ao sat = 100% PA sat = 45%, 45% Ao = 131/86 (100)  He was in cardiogenic shock with low output and elevated  filling pressures. He was started on milrinone and diuresed with IV Lasix. His urine output increased and his CO-OX improved. His magnesium was low at 1.5 and was supplemented. His discharge weight is 207 pounds. At home, because he is unable to stand without assistance, he will weigh himself on his home scale with his braces. He is to track his weight and report gains. The importance of a low sodium diabetic diet was reinforced. The importance of tobacco cessation was reinforced and his wife agrees to quit as well.   His diabetes medications were adjusted for better blood sugar control. Because of renal insufficiency, the metformin and Actos were discontinued. His Lantus doses were changed and knee is encouraged to follow-up with his primary care physician.  He was noted to be anemic, with a hemoglobin at approximately the same level it was when he left the hospital in May 2015. He was given one infusion of Feraheme and is to follow-up with primary care for this. Discharge CBC is below.  His renal function improved on the milrinone as did his oxygenation. The dose was decreased, but his oxygenation decreased so he is going home on 0.25 mcg/kg/m. The home health agency will manage this as an outpatient. Because of the renal insufficiency, an ARB or ACE inhibitor will not be prescribed. Additionally, his blood pressure is low at times and because of this, he is not on a beta blocker.  A palliative care consult was called to establish goals of care. He is encouraged to discuss his wishes and healthcare desires with his wife but  currently is a full code.  A speech pathology consult was called because of a cough that he has had problems with for months. It is productive at times. He is on Robitussin DM for this and was also started on [employer]. Speech pathology and not find any aspiration or other significant abnormality. Because of his GERD and history of esophageal candidiasis, he is encouraged to  follow-up with Dr. Rhea Belton with GI.   On 11/21/2014, he was seen by Dr. Gala Romney and all data were reviewed. His respiratory status is felt to be at baseline. He was seen by the VAD coordinator and given information on this procedure. He will have early follow-up in the office and is encouraged to refrain from smoking, stick to a low sodium diabetic diet, and do daily weights in addition to medication compliance. No further inpatient workup was indicated and he is considered stable for discharge, to follow-up as an outpatient.   History of Present Illness  12/18/2014 1st [facility] office visit/ [name]   Chief Complaint  Patient presents with  . Pulmonary Consult    Referred by Dr. Shirlee Latch. Pt c/o cough for the past 3 wks. Cough is occ prod only in the am with white sputum. Cough is esp worse at night when he lies down. ACE stopped approx 10 days ago.   states definitely better since acei d/c'd (due to renal failure)   But still very hoarse/ wheezy esp at hs - takes ppi inconsistently c overt HB, has not tried inhalers  rec Nexium 40 mg Take 30-60 min before first meal of the day and pepcid ac 20 mg at bedtime  Depomedrol 120 mg IM in office GERD (REFLUX)  Diet  For cough try tessalon pearls up to every 4 hours and ok to supplement with tylenol #3 one every 4 hours if needed "didn't do a thing"    01/22/2015 f/u ov/[name] re: cough better off acei since 2 m prior to OV / still smoking   Chief Complaint  Patient presents with  . Follow-up    PFT done today. Pt states cough has slightly improved since the last visit. No new co's today.     Dry cough nightly when head hits pillow ever since last admit  and wakes him up -  not elevating hob/ did not bring meds as requested but does appear to be taking ppi qam and h2hs   Not limited by breathing from desired activities  - doing much better on IV milrinone   No obvious day to day or daytime variability or assoc excess or purulent mucus  or cp  or chest tightness, subjective wheeze or overt sinus or hb symptoms. No unusual exp hx or h/o childhood pna/ asthma or knowledge of premature birth.  Also denies any obvious fluctuation of symptoms with weather or environmental changes or other aggravating or alleviating factors except as outlined above   Current Medications, Allergies, Complete Past Medical History, Past Surgical History, Family History, and Social History were reviewed in [employer] record.  ROS  The following are not active complaints unless bolded sore throat, dysphagia, dental problems, itching, sneezing,  nasal congestion or excess/ purulent secretions, ear ache,   fever, chills, sweats, unintended wt loss, classically pleuritic or exertional cp, hemoptysis,  orthopnea pnd or leg swelling, presyncope, palpitations, abdominal pain, anorexia, nausea, vomiting, diarrhea  or change in bowel or bladder habits, change in stools or urine, dysuria,hematuria,  rash, arthralgias, visual complaints, headache, numbness, weakness  or ataxia or problems with walking or coordination,  change in mood/affect or memory.             Objective:   Physical Exam   W/c bound bm nad   01/22/2015       210  Wt Readings from Last 3 Encounters:  12/18/14 210 lb (95.255 kg)  11/30/14 202 lb 2 oz (91.683 kg)  11/28/14 204 lb 8 oz (92.761 kg)    Vital signs reviewed    HEENT: nl dentition, turbinates, and orophanx. Nl external ear canals without cough reflex   NECK :  without JVD/Nodes/TM/ nl carotid upstrokes bilaterally   LUNGS: no acc muscle use,  Difficult exam with lots of upper airway noise / pseudoweeze    CV:  RRR  Classic S3  II/VI sem/ no  increase in P2,  Trace  bilateral lower ext edema   ABD:  soft and nontender with nl excursion in the supine position. No bruits or organomegaly, bowel sounds nl  MS:  warm without deformities, calf tenderness, cyanosis or clubbing  SKIN: warm and dry without  lesions    NEURO:  alert, approp, no deficits    I personally reviewed images and agree with radiology impression as follows:  CXR:   01/22/2015 Refused cxr      Lab Results  Component Value Date   CREATININE 3.18* 11/28/2014   CREATININE 1.53* 11/21/2014   CREATININE 1.44* 11/20/2014       Assessment & Plan:

## 2015-01-22 NOTE — Assessment & Plan Note (Signed)
Off ace around Nov 28 2014 > some better 01/22/2015   Still having mostly noct cough c/w    Upper airway cough syndrome, so named because it's frequently impossible to sort out how much is  CR/sinusitis with freq throat clearing (which can be related to primary GERD)   vs  causing  secondary (" extra esophageal")  GERD from wide swings in gastric pressure that occur with throat clearing, often  promoting self use of mint and menthol lozenges that reduce the lower esophageal sphincter tone and exacerbate the problem further in a cyclical fashion.   These are the same pts (now being labeled as having "irritable larynx syndrome" by some cough centers) who not infrequently have a history of having failed to tolerate ace inhibitors,  dry powder inhalers or biphosphonates or report having atypical reflux symptoms that don't necessarily respond to standard doses of PPI , and are easily confused as having aecopd or asthma flares by even experienced allergists/ pulmonologists.    I had an extended discussion with the patient reviewing all relevant studies completed to date and  lasting 15 to 20 minutes of a 25 minute visit    1) The standardized cough guidelines published in Chest by [place] in 2006 are still the best available and consist of a multiple step process (up to 12!) , not a single office visit,  and are intended  to address this problem logically,  with an alogrithm dependent on response to empiric treatment at  each progressive step  to determine a specific diagnosis with  minimal addtional testing needed. Therefore if adherence is an issue or can't be accurately verified,  it's very unlikely the standard evaluation and treatment will be successful here.    Furthermore, response to therapy (other than acute cough suppression, which should only be used short term with avoidance of narcotic containing cough syrups if possible), can be a gradual process for which the patient may perceive immediate  benefit.  Unlike going to an eye doctor where the best perscription is almost always the first one and is immediately effective, this is almost never the case in the management of chronic cough syndromes. Therefore the patient needs to commit up front to consistently adhere to recommendations  for up to 6 weeks of therapy directed at the likely underlying problem(s) before the response can be reasonably evaluated.   2) rec try h1 hs and return for med reconciliation using a trust but verify approach   3)  Each maintenance medication was reviewed in detail including most importantly the difference between maintenance and prns and under what circumstances the prns are to be triggered using an action plan format that is not reflected in the computer generated alphabetically organized AVS.    Please see instructions for details which were reviewed in writing and the patient given a copy highlighting the part that I personally wrote and discussed at today's ov.

## 2015-01-22 NOTE — Telephone Encounter (Signed)
Patient called to report no dressing changes or materials were delivered or done for his PICC line last week.  States usually this is done on Friday, and has not heard anything from his [facility] nurse.  Will message Henderson Newcomer to follow up.  Ave Filter

## 2015-01-22 NOTE — Progress Notes (Signed)
PFT done today. 

## 2015-01-22 NOTE — Patient Instructions (Addendum)
For drainage take chlortrimeton (chlorpheniramine) 4 mg every 4 hours available over the counter (may cause drowsiness - take 1-2 at bedtime   Please remember to go to the x-ray department downstairs for your tests - we will call you with the results when they are available.    The key is to stop smoking completely before smoking completely stops you  - it is not too late  GERD (REFLUX)  is an extremely common cause of respiratory symptoms just like yours , many times with no obvious heartburn at all.    It can be treated with medication, but also with lifestyle changes including elevation of the head of your bed (ideally with 6 inch  bed blocks),  Smoking cessation, avoidance of late meals, excessive alcohol, and avoid fatty foods, chocolate, peppermint, colas, red wine, and acidic juices such as orange juice.  NO MINT OR MENTHOL PRODUCTS SO NO COUGH DROPS  USE SUGARLESS CANDY INSTEAD (Jolley ranchers or Stover's or Life Savers) or even ice chips will also do - the key is to swallow to prevent all throat clearing. NO OIL BASED VITAMINS - use powdered substitutes.    If not satisfied the next step  >  See Tammy NP  with all your medications, even over the counter meds, separated in two separate bags, the ones you take no matter what vs the ones you stop once you feel better and take only as needed when you feel you need them.   Tammy  will generate for you a new user friendly medication calendar that will put [place] all on the same page re: your medication use.     Without this process, it simply isn't possible to assure that we are providing  your outpatient care  with  the attention to detail we feel you deserve.   If we cannot assure that you're getting that kind of care,  then we cannot manage your problem effectively from this clinic.  Once you have seen Tammy and we are sure that we're all on the same page with your medication use she will arrange follow up with me.

## 2015-01-22 NOTE — Assessment & Plan Note (Signed)

## 2015-01-24 ENCOUNTER — Other Ambulatory Visit (HOSPITAL_COMMUNITY): Payer: Medicare Other

## 2015-01-25 ENCOUNTER — Other Ambulatory Visit (HOSPITAL_COMMUNITY): Payer: Self-pay

## 2015-01-25 ENCOUNTER — Ambulatory Visit (HOSPITAL_COMMUNITY)
Admission: RE | Admit: 2015-01-25 | Discharge: 2015-01-25 | Disposition: A | Discharge status: Home / Self Care | Payer: Medicare Other | Source: Ambulatory Visit | Attending: Internal Medicine | Admitting: Internal Medicine

## 2015-01-25 ENCOUNTER — Telehealth (HOSPITAL_COMMUNITY): Payer: Self-pay

## 2015-01-25 DIAGNOSIS — I5022 Chronic systolic (congestive) heart failure: Secondary | ICD-10-CM

## 2015-01-25 LAB — CARBOXYHEMOGLOBIN
CARBOXYHEMOGLOBIN: 9.1 % — AB (ref 0.5–1.5)
METHEMOGLOBIN: 1.4 % (ref 0.0–1.5)
O2 SAT: 71.4 %
TOTAL HEMOGLOBIN: 11.8 g/dL — AB (ref 13.5–18.0)

## 2015-01-25 NOTE — Telephone Encounter (Signed)
LVMOM for patient to return call.  Coox sat remains stable at 71.4, per Amy Clegg NP-C ok to have [facility] turn milrinone off and recheck coox next week.  Info left on VM, asked to call us back to schedule next weeks lab draw.  Will fax order to DC milrinone drip and hep lock PICC line.  Ave Filter

## 2015-01-25 NOTE — Progress Notes (Signed)
Critical Lab value reported from [facility] main lab for Coox Carboxyhemoglobin: 9.1  Per Amy, Sat stable at 71.4, okay to DC milrinone drip and recheck coox in 1 week.  Patient has Hx of heavy smoking and thinks this may be contributing to high carboxyhgb level. Will report to patient.  Ave Filter

## 2015-01-26 ENCOUNTER — Telehealth: Payer: Self-pay | Admitting: *Deleted

## 2015-01-26 MED ORDER — MAGNESIUM OXIDE 400 MG PO TABS: 400.0000 mg | ORAL_TABLET | Freq: Three times a day (TID) | ORAL | Status: DC

## 2015-01-26 NOTE — Telephone Encounter (Signed)
Received labs from [facility] from earlier this week, mag still low at 1.2, pt states he is taking mag-ox 400 mg Twice daily, per Otilio Saber, PA increase to 400 mg Three times a day, pt aware and agreeable.  01/23/15 Labs: Mag 1.2  Bun 34 Cr 1.71  K 3.6

## 2015-01-26 NOTE — Addendum Note (Signed)
Addended by: Noralee Space on: 01/26/2015 02:52 PM   Modules accepted: Orders

## 2015-01-29 ENCOUNTER — Encounter: Payer: Self-pay | Admitting: Internal Medicine

## 2015-01-30 ENCOUNTER — Inpatient Hospital Stay (HOSPITAL_COMMUNITY): Payer: Medicare Other

## 2015-01-30 ENCOUNTER — Emergency Department (HOSPITAL_COMMUNITY): Payer: Medicare Other

## 2015-01-30 ENCOUNTER — Inpatient Hospital Stay (HOSPITAL_COMMUNITY)
Admission: EM | Admit: 2015-01-30 | Discharge: 2015-02-04 | DRG: 682 | Disposition: A | Discharge status: Home / Self Care | Payer: Medicare Other | Attending: Internal Medicine | Admitting: Internal Medicine

## 2015-01-30 ENCOUNTER — Encounter (HOSPITAL_COMMUNITY): Payer: Self-pay | Admitting: Emergency Medicine

## 2015-01-30 DIAGNOSIS — E871 Hypo-osmolality and hyponatremia: Secondary | ICD-10-CM | POA: Diagnosis present

## 2015-01-30 DIAGNOSIS — Z981 Arthrodesis status: Secondary | ICD-10-CM

## 2015-01-30 DIAGNOSIS — E785 Hyperlipidemia, unspecified: Secondary | ICD-10-CM | POA: Diagnosis present

## 2015-01-30 DIAGNOSIS — Z794 Long term (current) use of insulin: Secondary | ICD-10-CM | POA: Diagnosis not present

## 2015-01-30 DIAGNOSIS — D696 Thrombocytopenia, unspecified: Secondary | ICD-10-CM | POA: Diagnosis present

## 2015-01-30 DIAGNOSIS — F329 Major depressive disorder, single episode, unspecified: Secondary | ICD-10-CM | POA: Diagnosis present

## 2015-01-30 DIAGNOSIS — N179 Acute kidney failure, unspecified: Secondary | ICD-10-CM | POA: Diagnosis not present

## 2015-01-30 DIAGNOSIS — K219 Gastro-esophageal reflux disease without esophagitis: Secondary | ICD-10-CM | POA: Diagnosis present

## 2015-01-30 DIAGNOSIS — T82524A Displacement of infusion catheter, initial encounter: Secondary | ICD-10-CM | POA: Diagnosis present

## 2015-01-30 DIAGNOSIS — F17210 Nicotine dependence, cigarettes, uncomplicated: Secondary | ICD-10-CM | POA: Diagnosis present

## 2015-01-30 DIAGNOSIS — Y848 Other medical procedures as the cause of abnormal reaction of the patient, or of later complication, without mention of misadventure at the time of the procedure: Secondary | ICD-10-CM | POA: Diagnosis present

## 2015-01-30 DIAGNOSIS — E1165 Type 2 diabetes mellitus with hyperglycemia: Secondary | ICD-10-CM | POA: Diagnosis present

## 2015-01-30 DIAGNOSIS — E875 Hyperkalemia: Secondary | ICD-10-CM | POA: Diagnosis present

## 2015-01-30 DIAGNOSIS — I509 Heart failure, unspecified: Secondary | ICD-10-CM

## 2015-01-30 DIAGNOSIS — E872 Acidosis: Secondary | ICD-10-CM | POA: Diagnosis present

## 2015-01-30 DIAGNOSIS — I429 Cardiomyopathy, unspecified: Secondary | ICD-10-CM | POA: Diagnosis present

## 2015-01-30 DIAGNOSIS — F32A Depression, unspecified: Secondary | ICD-10-CM | POA: Diagnosis present

## 2015-01-30 DIAGNOSIS — R570 Cardiogenic shock: Secondary | ICD-10-CM | POA: Diagnosis present

## 2015-01-30 DIAGNOSIS — N183 Chronic kidney disease, stage 3 unspecified: Secondary | ICD-10-CM | POA: Diagnosis present

## 2015-01-30 DIAGNOSIS — Z79899 Other long term (current) drug therapy: Secondary | ICD-10-CM

## 2015-01-30 DIAGNOSIS — I130 Hypertensive heart and chronic kidney disease with heart failure and stage 1 through stage 4 chronic kidney disease, or unspecified chronic kidney disease: Secondary | ICD-10-CM | POA: Diagnosis present

## 2015-01-30 DIAGNOSIS — R531 Weakness: Secondary | ICD-10-CM

## 2015-01-30 DIAGNOSIS — Z720 Tobacco use: Secondary | ICD-10-CM | POA: Diagnosis present

## 2015-01-30 DIAGNOSIS — I5023 Acute on chronic systolic (congestive) heart failure: Secondary | ICD-10-CM | POA: Diagnosis present

## 2015-01-30 DIAGNOSIS — N170 Acute kidney failure with tubular necrosis: Principal | ICD-10-CM | POA: Diagnosis present

## 2015-01-30 DIAGNOSIS — I5022 Chronic systolic (congestive) heart failure: Secondary | ICD-10-CM | POA: Diagnosis not present

## 2015-01-30 DIAGNOSIS — E1142 Type 2 diabetes mellitus with diabetic polyneuropathy: Secondary | ICD-10-CM | POA: Diagnosis present

## 2015-01-30 HISTORY — DX: Chronic systolic (congestive) heart failure: I50.22

## 2015-01-30 LAB — BASIC METABOLIC PANEL
ANION GAP: 16 — AB (ref 5–15)
Anion gap: 22 — ABNORMAL HIGH (ref 5–15)
Anion gap: 16 — ABNORMAL HIGH (ref 5–15)
BUN: 102 mg/dL — AB (ref 6–20)
BUN: 101 mg/dL — AB (ref 6–20)
BUN: 106 mg/dL — ABNORMAL HIGH (ref 6–20)
CHLORIDE: 96 mmol/L — AB (ref 101–111)
CHLORIDE: 95 mmol/L — AB (ref 101–111)
CO2: 16 mmol/L — AB (ref 22–32)
CO2: 22 mmol/L (ref 22–32)
CO2: 22 mmol/L (ref 22–32)
Calcium: 9.3 mg/dL (ref 8.9–10.3)
Calcium: 9.5 mg/dL (ref 8.9–10.3)
Calcium: 9.1 mg/dL (ref 8.9–10.3)
Chloride: 97 mmol/L — ABNORMAL LOW (ref 101–111)
Creatinine, Ser: 4.13 mg/dL — ABNORMAL HIGH (ref 0.61–1.24)
Creatinine, Ser: 4.04 mg/dL — ABNORMAL HIGH (ref 0.61–1.24)
Creatinine, Ser: 3.72 mg/dL — ABNORMAL HIGH (ref 0.61–1.24)
GFR calc Af Amer: 18 mL/min — ABNORMAL LOW (ref >60)
GFR calc Af Amer: 18 mL/min — ABNORMAL LOW (ref >60)
GFR calc Af Amer: 20 mL/min — ABNORMAL LOW (ref >60)
GFR calc non Af Amer: 17 mL/min — ABNORMAL LOW (ref >60)
GFR, EST NON AFRICAN AMERICAN: 15 mL/min — AB (ref >60)
GFR, EST NON AFRICAN AMERICAN: 16 mL/min — AB (ref >60)
GLUCOSE: 168 mg/dL — AB (ref 65–99)
Glucose, Bld: 84 mg/dL (ref 65–99)
Glucose, Bld: 152 mg/dL — ABNORMAL HIGH (ref 65–99)
Potassium: 6.4 mmol/L (ref 3.5–5.1)
Potassium: 5.9 mmol/L — ABNORMAL HIGH (ref 3.5–5.1)
SODIUM: 134 mmol/L — AB (ref 135–145)
Sodium: 135 mmol/L (ref 135–145)
Sodium: 133 mmol/L — ABNORMAL LOW (ref 135–145)

## 2015-01-30 LAB — I-STAT CHEM 8, ED
BUN: 89 mg/dL — AB (ref 6–20)
CALCIUM ION: 1.06 mmol/L — AB (ref 1.12–1.23)
Chloride: 99 mmol/L — ABNORMAL LOW (ref 101–111)
Creatinine, Ser: 4.3 mg/dL — ABNORMAL HIGH (ref 0.61–1.24)
GLUCOSE: 82 mg/dL (ref 65–99)
HCT: 42 % (ref 39.0–52.0)
HEMOGLOBIN: 14.3 g/dL (ref 13.0–17.0)
POTASSIUM: 7.5 mmol/L — AB (ref 3.5–5.1)
SODIUM: 133 mmol/L — AB (ref 135–145)
TCO2: 18 mmol/L (ref 0–100)

## 2015-01-30 LAB — CBC WITH DIFFERENTIAL/PLATELET
BASOS ABS: 0 10*3/uL (ref 0.0–0.1)
Basophils Relative: 0 % (ref 0–1)
Eosinophils Absolute: 0 10*3/uL (ref 0.0–0.7)
Eosinophils Relative: 0 % (ref 0–5)
HCT: 38.3 % — ABNORMAL LOW (ref 39.0–52.0)
Hemoglobin: 11.8 g/dL — ABNORMAL LOW (ref 13.0–17.0)
LYMPHS ABS: 1.4 10*3/uL (ref 0.7–4.0)
Lymphocytes Relative: 10 % — ABNORMAL LOW (ref 12–46)
MCH: 23 pg — AB (ref 26.0–34.0)
MCHC: 30.8 g/dL (ref 30.0–36.0)
MCV: 74.7 fL — AB (ref 78.0–100.0)
MONOS PCT: 12 % (ref 3–12)
Monocytes Absolute: 1.6 10*3/uL — ABNORMAL HIGH (ref 0.1–1.0)
Neutro Abs: 10.5 10*3/uL — ABNORMAL HIGH (ref 1.7–7.7)
Neutrophils Relative %: 78 % — ABNORMAL HIGH (ref 43–77)
PLATELETS: 104 10*3/uL — AB (ref 150–400)
RBC: 5.13 MIL/uL (ref 4.22–5.81)
RDW: 19.7 % — AB (ref 11.5–15.5)
WBC: 13.5 10*3/uL — ABNORMAL HIGH (ref 4.0–10.5)

## 2015-01-30 LAB — CARBOXYHEMOGLOBIN
CARBOXYHEMOGLOBIN: 3.1 % — AB (ref 0.5–1.5)
Methemoglobin: 1.6 % — ABNORMAL HIGH (ref 0.0–1.5)
O2 Saturation: 75.4 %
Total hemoglobin: 11.2 g/dL — ABNORMAL LOW (ref 13.5–18.0)

## 2015-01-30 LAB — CBG MONITORING, ED: Glucose-Capillary: 147 mg/dL — ABNORMAL HIGH (ref 65–99)

## 2015-01-30 LAB — MRSA PCR SCREENING: MRSA by PCR: NEGATIVE

## 2015-01-30 LAB — URINALYSIS, ROUTINE W REFLEX MICROSCOPIC
GLUCOSE, UA: NEGATIVE mg/dL
Ketones, ur: 15 mg/dL — AB
NITRITE: NEGATIVE
Protein, ur: NEGATIVE mg/dL
Specific Gravity, Urine: 1.012 (ref 1.005–1.030)
Urobilinogen, UA: 1 mg/dL (ref 0.0–1.0)
pH: 5 (ref 5.0–8.0)

## 2015-01-30 LAB — GLUCOSE, CAPILLARY
GLUCOSE-CAPILLARY: 166 mg/dL — AB (ref 65–99)
Glucose-Capillary: 163 mg/dL — ABNORMAL HIGH (ref 65–99)

## 2015-01-30 LAB — CREATININE, URINE, RANDOM: CREATININE, URINE: 73.67 mg/dL

## 2015-01-30 LAB — URINE MICROSCOPIC-ADD ON

## 2015-01-30 LAB — LACTIC ACID, PLASMA: Lactic Acid, Venous: 2.5 mmol/L (ref 0.5–2.0)

## 2015-01-30 LAB — I-STAT TROPONIN, ED: TROPONIN I, POC: 0.12 ng/mL — AB (ref 0.00–0.08)

## 2015-01-30 LAB — BRAIN NATRIURETIC PEPTIDE
B NATRIURETIC PEPTIDE 5: 2790.6 pg/mL — AB (ref 0.0–100.0)
B Natriuretic Peptide: 3105 pg/mL — ABNORMAL HIGH (ref 0.0–100.0)

## 2015-01-30 LAB — DIGOXIN LEVEL: Digoxin Level: 1.8 ng/mL (ref 0.8–2.0)

## 2015-01-30 LAB — SODIUM, URINE, RANDOM: Sodium, Ur: 45 mmol/L

## 2015-01-30 MED ORDER — HYDRALAZINE HCL 50 MG PO TABS
75.0000 mg | ORAL_TABLET | Freq: Three times a day (TID) | ORAL | Status: DC
  Administered 2015-01-31 – 2015-02-04 (×14): 75 mg via ORAL
  Filled 2015-01-30 (×19): qty 1

## 2015-01-30 MED ORDER — INSULIN ASPART 100 UNIT/ML ~~LOC~~ SOLN
10.0000 [IU] | Freq: Once | SUBCUTANEOUS | Status: AC
  Administered 2015-01-30: 10 [IU] via INTRAVENOUS
  Filled 2015-01-30: qty 1

## 2015-01-30 MED ORDER — INSULIN ASPART 100 UNIT/ML ~~LOC~~ SOLN
0.0000 [IU] | SUBCUTANEOUS | Status: DC
  Administered 2015-01-30 (×2): 2 [IU] via SUBCUTANEOUS
  Administered 2015-01-31: 1 [IU] via SUBCUTANEOUS
  Administered 2015-01-31 (×2): 2 [IU] via SUBCUTANEOUS
  Administered 2015-02-01: 3 [IU] via SUBCUTANEOUS
  Administered 2015-02-01: 2 [IU] via SUBCUTANEOUS
  Administered 2015-02-01: 1 [IU] via SUBCUTANEOUS
  Administered 2015-02-01: 5 [IU] via SUBCUTANEOUS
  Administered 2015-02-01: 2 [IU] via SUBCUTANEOUS
  Administered 2015-02-01: 1 [IU] via SUBCUTANEOUS
  Administered 2015-02-02: 2 [IU] via SUBCUTANEOUS
  Administered 2015-02-02: 3 [IU] via SUBCUTANEOUS
  Administered 2015-02-02: 1 [IU] via SUBCUTANEOUS
  Administered 2015-02-02 (×3): 5 [IU] via SUBCUTANEOUS
  Administered 2015-02-03: 3 [IU] via SUBCUTANEOUS
  Administered 2015-02-03: 5 [IU] via SUBCUTANEOUS
  Administered 2015-02-03: 7 [IU] via SUBCUTANEOUS
  Administered 2015-02-03: 5 [IU] via SUBCUTANEOUS
  Administered 2015-02-03 – 2015-02-04 (×4): 3 [IU] via SUBCUTANEOUS
  Administered 2015-02-04: 2 [IU] via SUBCUTANEOUS
  Administered 2015-02-04: 3 [IU] via SUBCUTANEOUS

## 2015-01-30 MED ORDER — SODIUM BICARBONATE 8.4 % IV SOLN
INTRAVENOUS | Status: DC
  Administered 2015-01-30 – 2015-01-31 (×2): via INTRAVENOUS
  Filled 2015-01-30 (×3): qty 150

## 2015-01-30 MED ORDER — DEXTROSE 50 % IV SOLN
50.0000 mL | Freq: Once | INTRAVENOUS | Status: AC
  Administered 2015-01-30: 50 mL via INTRAVENOUS
  Filled 2015-01-30: qty 50

## 2015-01-30 MED ORDER — SODIUM POLYSTYRENE SULFONATE 15 GM/60ML PO SUSP
30.0000 g | Freq: Once | ORAL | Status: AC
Start: 2015-01-30 — End: 2015-01-30
  Administered 2015-01-30: 30 g via RECTAL
  Filled 2015-01-30: qty 120

## 2015-01-30 MED ORDER — CALCIUM GLUCONATE 10 % IV SOLN
1.0000 g | Freq: Once | INTRAVENOUS | Status: AC
  Administered 2015-01-30: 1 g via INTRAVENOUS
  Filled 2015-01-30: qty 10

## 2015-01-30 MED ORDER — ALBUTEROL SULFATE (2.5 MG/3ML) 0.083% IN NEBU
5.0000 mg | INHALATION_SOLUTION | Freq: Once | RESPIRATORY_TRACT | Status: AC
  Administered 2015-01-30: 5 mg via RESPIRATORY_TRACT
  Filled 2015-01-30: qty 6

## 2015-01-30 MED ORDER — SIMVASTATIN 10 MG PO TABS
10.0000 mg | ORAL_TABLET | Freq: Every day | ORAL | Status: DC
  Administered 2015-01-31 – 2015-02-03 (×4): 10 mg via ORAL
  Filled 2015-01-30 (×4): qty 1

## 2015-01-30 MED ORDER — SODIUM CHLORIDE 0.9 % IV SOLN
1.0000 g | Freq: Once | INTRAVENOUS | Status: AC
  Administered 2015-01-30: 1 g via INTRAVENOUS
  Filled 2015-01-30: qty 10

## 2015-01-30 MED ORDER — DIGOXIN 125 MCG PO TABS
0.1250 mg | ORAL_TABLET | Freq: Every day | ORAL | Status: DC
  Administered 2015-01-30 – 2015-02-02 (×4): 0.125 mg via ORAL
  Filled 2015-01-30 (×4): qty 1

## 2015-01-30 MED ORDER — CETYLPYRIDINIUM CHLORIDE 0.05 % MT LIQD
7.0000 mL | Freq: Two times a day (BID) | OROMUCOSAL | Status: DC
  Administered 2015-01-30 – 2015-02-04 (×10): 7 mL via OROMUCOSAL

## 2015-01-30 MED ORDER — SODIUM CHLORIDE 0.9 % IV SOLN: 250.0000 mL | INTRAVENOUS | Status: DC | PRN

## 2015-01-30 NOTE — ED Notes (Signed)
Pt hooked up to zoll monitor

## 2015-01-30 NOTE — ED Notes (Signed)
Pt portable CXR showed that PICC line is not in proper place. EDP was informed and IJ was placed in the left side of neck d/t multiple unsuccessful attempts for peripheral IV.

## 2015-01-30 NOTE — Progress Notes (Signed)
eLink Physician-Brief Progress Note Patient Name: Christian Wall DOB: [birth year] MRN: [id number]   Date of Service  01/30/2015  HPI/Events of Note  K+ = 6.4.  eICU Interventions  Will order: 1. Kayexalate enema 30 gm PR now.  2. Repeat BMP at 11 PM.     Intervention Category Major Interventions: Electrolyte abnormality - evaluation and management  [name] 01/30/2015, 6:23 PM

## 2015-01-30 NOTE — ED Notes (Signed)
Pt arrived by [facility] from home with wife. Pt has heart hx and has been on Milrinone drip for atleast 2.5 months and was told that his numbers were so good that they stopped the drip this past Friday. Pt denies any pain. Saturday pt started having N/V/D and took Imodium that evening and has not had any N/V/D since and denies any fevers. Pt has PICC line in right upper arm. Pt wife at bedside stated that he was recently hospitalized for heart issues and since they stopped the Milrinone drip Friday he has not been right since with weakness and decreased appetite.

## 2015-01-30 NOTE — Consult Note (Signed)
Reason for Consult: Acute Renal Failure Referring Physician: ED Physician  HPI: Christian Wall is a 54 yo male with PMHx of CKD, GERD, HLD, G8ZM, chronic systolic CHF previously on home milrinone gtt (d/c 7/21) and HLD who presented to the ED with complaint of nausea, vomiting, diarrhea (that occurred 3 days ago on 7/23) and weakness and decreased appetite. His wife states he hasn't felt right since the milrinone was discontinued on 7/21. Patient was found to be hyperkalemic at 7.5 and in acute renal failure with BUN/Cr of 102/4.13. Baseline appears to be 1.5-2.5. Patient does not follow with a Nephrologist. Patient was also found to have an anion gapped metabolic acidosis. Patient was given albuterol nebulizer, insulin/D50 x 2, calcium gluconate x 2, and started on a sodium bicarbonate 150 mEq in D5W infusion for the above diagnoses. Nephrology has been consulted for evaluation and management of acute kidney injury.    Past Medical History  Diagnosis Date  . Diabetes mellitus   . Atrophy of calf muscles   . Depression   . GERD (gastroesophageal reflux disease)   . Peripheral neuropathy   . Hypercholesteremia   . Hx of tracheostomy   . Chronic systolic CHF (congestive heart failure), NYHA class 3     Past Surgical History  Procedure Laterality Date  . Pancreas surgery  2001    hx pancreatitis  . Anterior cervical decomp/discectomy fusion  04/13/2012    Procedure: ANTERIOR CERVICAL DECOMPRESSION/DISCECTOMY FUSION 1 LEVEL;  Surgeon: Charlie Pitter, MD;  Location: [facility];  Service: Neurosurgery;  Laterality: N/A;  Cervcial Five-Six Anterior Cervical Decompression and Fusion with Allograft and Plating  . Cardiac catheterization N/A 11/15/2014    Procedure: Right Heart Cath;  Surgeon: Jolaine Artist, MD;  Location: [facility];  Service: Cardiovascular;  Laterality: N/A;    Family History  Problem Relation Age of Onset  . Diabetes Mellitus II Sister   . CAD Brother   . Cancer  Mother     ? type   . Cancer Father     ? type    Social History:  reports that he has been smoking Cigarettes.  He has a 30 pack-year smoking history. He has never used smokeless tobacco. He reports that he does not drink alcohol or use illicit drugs.  Allergies: No Known Allergies  Medications:  I have reviewed the patient's current medications. Prior to Admission:  (Not in a hospital admission) Scheduled: . insulin aspart  0-9 Units Subcutaneous 6 times per day   Continuous: .  sodium bicarbonate  infusion 1000 mL 75 mL/hr at 01/30/15 1536   PRN:  Results for orders placed or performed during the hospital encounter of 01/30/15 (from the past 48 hour(s))  I-stat troponin, ED     Status: Abnormal   Collection Time: 01/30/15 12:04 PM  Result Value Ref Range   Troponin i, poc 0.12 (HH) 0.00 - 0.08 ng/mL   Comment NOTIFIED PHYSICIAN    Comment 3            Comment: Due to the release kinetics of cTnI, a negative result within the first hours of the onset of symptoms does not rule out myocardial infarction with certainty. If myocardial infarction is still suspected, repeat the test at appropriate intervals.   CBC with Differential/Platelet     Status: Abnormal   Collection Time: 01/30/15 12:05 PM  Result Value Ref Range   WBC 13.5 (H) 4.0 - 10.5 K/uL   RBC 5.13 4.22 -  5.81 MIL/uL   Hemoglobin 11.8 (L) 13.0 - 17.0 g/dL   HCT 38.3 (L) 39.0 - 52.0 %   MCV 74.7 (L) 78.0 - 100.0 fL   MCH 23.0 (L) 26.0 - 34.0 pg   MCHC 30.8 30.0 - 36.0 g/dL   RDW 19.7 (H) 11.5 - 15.5 %   Platelets 104 (L) 150 - 400 K/uL    Comment: PLATELET COUNT CONFIRMED BY SMEAR   Neutrophils Relative % 78 (H) 43 - 77 %   Lymphocytes Relative 10 (L) 12 - 46 %   Monocytes Relative 12 3 - 12 %   Eosinophils Relative 0 0 - 5 %   Basophils Relative 0 0 - 1 %   Neutro Abs 10.5 (H) 1.7 - 7.7 K/uL   Lymphs Abs 1.4 0.7 - 4.0 K/uL   Monocytes Absolute 1.6 (H) 0.1 - 1.0 K/uL   Eosinophils Absolute 0.0 0.0 -  0.7 K/uL   Basophils Absolute 0.0 0.0 - 0.1 K/uL   RBC Morphology RARE NRBCs     Comment: POLYCHROMASIA PRESENT ELLIPTOCYTES TARGET CELLS   Basic metabolic panel     Status: Abnormal   Collection Time: 01/30/15 12:05 PM  Result Value Ref Range   Sodium 134 (L) 135 - 145 mmol/L   Potassium >7.5 (HH) 3.5 - 5.1 mmol/L    Comment: CRITICAL RESULT CALLED TO, READ BACK BY AND VERIFIED WITH: BRADDOCK E RN 01/30/15 1248 COSTELLO B REPEATED TO VERIFY    Chloride 96 (L) 101 - 111 mmol/L   CO2 16 (L) 22 - 32 mmol/L   Glucose, Bld 84 65 - 99 mg/dL   BUN 102 (H) 6 - 20 mg/dL   Creatinine, Ser 4.13 (H) 0.61 - 1.24 mg/dL   Calcium 9.3 8.9 - 10.3 mg/dL   GFR calc non Af Amer 15 (L) >60 mL/min   GFR calc Af Amer 18 (L) >60 mL/min    Comment: (NOTE) The eGFR has been calculated using the CKD EPI equation. This calculation has not been validated in all clinical situations. eGFR's persistently <60 mL/min signify possible Chronic Kidney Disease.    Anion gap 22 (H) 5 - 15  Digoxin level     Status: None   Collection Time: 01/30/15 12:05 PM  Result Value Ref Range   Digoxin Level 1.8 0.8 - 2.0 ng/mL  Brain natriuretic peptide     Status: Abnormal   Collection Time: 01/30/15 12:05 PM  Result Value Ref Range   B Natriuretic Peptide 3105.0 (H) 0.0 - 100.0 pg/mL  I-stat chem 8, ed     Status: Abnormal   Collection Time: 01/30/15 12:06 PM  Result Value Ref Range   Sodium 133 (L) 135 - 145 mmol/L   Potassium 7.5 (HH) 3.5 - 5.1 mmol/L   Chloride 99 (L) 101 - 111 mmol/L   BUN 89 (H) 6 - 20 mg/dL   Creatinine, Ser 4.30 (H) 0.61 - 1.24 mg/dL   Glucose, Bld 82 65 - 99 mg/dL   Calcium, Ion 1.06 (L) 1.12 - 1.23 mmol/L   TCO2 18 0 - 100 mmol/L   Hemoglobin 14.3 13.0 - 17.0 g/dL   HCT 42.0 39.0 - 52.0 %   Comment NOTIFIED PHYSICIAN   CBG monitoring, ED     Status: Abnormal   Collection Time: 01/30/15  3:40 PM  Result Value Ref Range   Glucose-Capillary 147 (H) 65 - 99 mg/dL   Comment 1 Notify RN     Comment 2 Document in Chart  Dg Chest Port 1 View  01/30/2015   CLINICAL DATA:  Shortness of breath and cough for 2 weeks.  EXAM: PORTABLE CHEST - 1 VIEW  COMPARISON:  11/17/2014 prior radiographs  FINDINGS: Cardiomegaly again noted.  A right PICC line is present coiled over the lower right neck.  Defibrillator pads overlying the left chest identified.  Mild right basilar atelectasis has decreased.  There is no evidence of focal airspace disease, pulmonary edema, suspicious pulmonary nodule/mass, pleural effusion, or pneumothorax. No acute bony abnormalities are identified.  IMPRESSION: Right PICC line coiled overlying the lower right neck.  Cardiomegaly without evidence of acute cardiopulmonary disease.  Decrease mild right basilar atelectasis.   Electronically Signed   By: Margarette Canada M.D.   On: 01/30/2015 12:15   ROS General: Admits to fatigue, decreased appetite and diaphoresis. Denies fever, chills Respiratory: Admits to cough. Denies SOB, chest tightness, and wheezing.   Cardiovascular: Denies chest pain and palpitations.  Gastrointestinal: Denies nausea, vomiting, abdominal pain, diarrhea, constipation Genitourinary: Denies dysuria, urgency, frequency, hematuria, suprapubic pain and flank pain. Skin: Denies pallor, rash and wounds.  Neurological: Admits to weakness. Denies dizziness, headaches, lightheadedness   Physical Exam Filed Vitals:   01/30/15 1500 01/30/15 1515 01/30/15 1530 01/30/15 1545  BP: 115/85 141/76 138/74 132/70  Pulse: 88 84 79 76  Temp:      TempSrc:      Resp:  _0 SpO2:  97% 96% 96%   General: Vital signs reviewed.  Patient is somnolent, well-developed and well-nourished, in no acute distress and cooperative with exam.  Cardiovascular: RRR, S1 normal, S2 normal, no murmurs, gallops, or rubs. Pulmonary/Chest: Diffuse rhonchi, coarse breath sounds, bilateral crackles in lower lung fields.  Abdominal: Soft, non-tender, non-distended, BS + Extremities:  No lower extremity edema bilaterally, pulses symmetric and intact bilaterally.  Neurological: A&O x3 Skin: Diaphoretic. Psychiatric: Normal mood and affect. speech and behavior is normal. Cognition and memory are normal.   Assessment/Plan:  Acute on Chronic Kidney Disease: BUN/Cr of 102/4.13. Unclear baseline, appears to be 1.5-2.5. Patient was admitted in May 2015 for DKA and Acute Renal Failure secondary to hypovolemia. Discharge creatinine 0.98 after IVF. Patient also admitted May 2016 with ARF secondary to cardiogenic shock. Renal function improved on milrinone and ACEI/ARB was held. Etiologies could include hypovolemia in setting of N/V/D or decreased cardiac contractility in setting of CHF. Less likely ACEI induced as this was discontinued in June. Doubt post-renal/obstruction as patient denies any history of urinary retention or difficult urination. DDx diagnosis is broad; we will proceed with work up.  -[facility] with strict I/Os -Check UA -Check Urine Sodium and Urine Creatinine -Renal US  -Repeat renal function panel tomorrow am  Hyperkalemia: Likely secondary to acute on chronic kidney disease and concomitant use of spironolactone 12.5 mg daily. Patient is not on potassium at home. No evidence of peaked TWs on EKG. Patient was given an albuterol nebulizer, insulin/D50, calcium gluconate, and started on a sodium bicarbonate 150 mEq in D5W infusion. Repeat BMET pending. No indication for HD emergently, but we will see what repeat BMET shows.  -Repeat renal function panel tomorrow am -Repeat BMET pending for this afternoon -Hold spironolactone -Sodium Bicarb in D5W at 75 cc/hr  Acute Decompensated Congestive Heart Failure: Likely secondary to recent d/c of milrinone. -Management per PCCM and Advance HF Team  Osa Craver, DO PGY-2 Internal Medicine Resident Pager # [phone] 01/30/2015 4:13 PM

## 2015-01-30 NOTE — Consult Note (Signed)
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   Admitted in 11/2014 with acute HF, cardiogenic shock and renal failure.  2-D echocardiogram was performed and showed an EF of 20-25 percent. He was initially diuresed but developed cardiorenal syndrome and increasing creatinine (peak BUN/creatinine 72/3.22). Underwent RHC (LHC not done due to renal failure) which showed elevated filling pressures and cardiogenic shock. Started on milrinone and diuresed. Attempted to wean milrinone in hospital but this was unsuccessful. D/C weight 207.   Has been followed closely in HF Clinic. Weight initially  215 pounds in June but had been decreasing down to 189 pounds on 07/18. At his request milrinone being weaned in HF Clinic On 7/21, co-ox was 71% so milrinone stopped. .   On 7/22 and 23, he had problems with nausea, vomiting and diarrhea. His symptoms improved with Imodium, but his by mouth intake has remained poor since then. He was taking his medications. He was not able to weigh himself. He became weaker and weaker. He was having no pain. His cough had gotten better on the Robitussin, but started to get worse again. Today, he came to the emergency room because he was so weak and feeling so poorly. In the emergency room, his potassium was 7.5 and his creatinine was greater than 4. CCM has been contacted for admission and he has been seen by Nephrology. Co-ox today 75%  Christian Wall feels that his respiratory status is close to baseline. He is happy that he is not in any pain. He feels very weak and doesn't feel like doing anything. He denies orthopnea or PND. He is coughing a bit more in the cough is productive of a white/clear sputum  at times, but not very much. He denies fevers or chills. He has no other ongoing issues.  [place] 11/13/14 RA = 29 RV = 51/21/30 PA = 48/29 (38) PCW = 30 Fick cardiac output/index = 3.8/1.7 Thermo CO/CI = 3.7/1.7 PVR = 2.9 WU SVR = 1,502  Ao sat = 100% PA sat = 45%, 45% Ao = 131/86 (100)  Labs:  11/21/14 K 4.4 Creatinine 1.53 12/19/2014: K 4.5 Creatinine 1.21 Mag 1.2  12/26/14 Co ox 70.1, BNP 1110.4 12/28/14 K 4.6, Creatinine 1.58, Mg 1.5 01/02/15 co-ox 57% Mag 1.1 Creatinine 1.47  01/10/2015: K 3.8 Creatinine 1.68 mag 1.1. Co-ox 75%. 01/25/2015 co-ox 71.4% 01/30/2015 K > 7.5, creatinine 4.13, co-ox pending    Review of Systems:     Cardiac Review of Systems: {Y] = yes [ ]  = no  Chest Pain [    ]  Resting SOB [   ] Exertional SOB  [ y ]  Orthopnea [  ]   Pedal Edema [   ]    Palpitations [  ] Syncope  [  ]   Presyncope [   ]  General Review of Systems: [Y] = yes [  ]=no Constitional: recent weight change [  ]; anorexia [ y ]; fatigue [  ]; nausea [  ]; night sweats [  ]; fever [  ]; or chills [  ];  Dental: poor dentition[  ];    Eye : blurred vision [  ]; diplopia [   ]; vision changes [  ];  Amaurosis fugax[  ]; Resp: cough [ y ];  wheezing[  ];  hemoptysis[  ]; shortness of breath[  ]; paroxysmal nocturnal dyspnea[  ]; dyspnea on exertion[  ]; or orthopnea[  ];  GI:  gallstones[  ], vomiting[ y ];  dysphagia[  ]; melena[  ];  hematochezia [  ]; heartburn[  ];   Hx of  Colonoscopy[  ]; GU: kidney stones [  ]; hematuria[  ];   dysuria [  ];  nocturia[  ];  history of     obstruction [  ];                 Skin: rash, swelling[  ];, hair loss[  ];  peripheral edema[  ];  or itching[  ]; Musculosketetal: myalgias[  ];  joint swelling[  ];  joint erythema[  ];  joint pain[  ];  back pain[  ];  Heme/Lymph: bruising[  ];  bleeding[  ];  anemia[  ];   Neuro: TIA[  ];  headaches[  ];  stroke[  ];  vertigo[  ];  seizures[  ];   paresthesias[  ];  difficulty walking[  ];  Psych:depression[  ]; anxiety[  ];  Endocrine: diabetes[  ];  thyroid dysfunction[  ];  Immunizations: Flu [  ]; Pneumococcal[  ];  Other:  Past Medical History  Diagnosis Date  . Diabetes mellitus   . Atrophy of calf muscles   . Depression   . GERD (gastroesophageal reflux disease)   . Peripheral neuropathy   . Hypercholesteremia   . Hx of tracheostomy   . Chronic systolic CHF (congestive heart failure), NYHA class 3    Past Surgical History  Procedure Laterality Date  . Pancreas surgery  2001    hx pancreatitis  . Anterior cervical decomp/discectomy fusion  04/13/2012    Procedure: ANTERIOR CERVICAL DECOMPRESSION/DISCECTOMY FUSION 1 LEVEL;  Surgeon: Charlie Pitter, MD;  Location: [facility];  Service: Neurosurgery;  Laterality: N/A;  Cervcial Five-Six Anterior Cervical Decompression and Fusion with Allograft and Plating  . Cardiac catheterization N/A 11/15/2014    Procedure: Right Heart Cath;  Surgeon: Jolaine Artist, MD;  Location: [facility];  Service: Cardiovascular;  Laterality: N/A;     Current Medications: . insulin aspart  0-9 Units Subcutaneous 6 times per day   Infusions:  .  sodium bicarbonate  infusion 1000 mL 75 mL/hr at 01/30/15 1536   Medication Sig  buPROPion (WELLBUTRIN SR) 150 MG 12 hr tablet Take 1 tablet (150 mg total) by mouth 2 (two) times daily.  CVS VITAMIN B12 1000 MCG tablet Take 1,000 mcg by mouth daily.  digoxin (LANOXIN) 0.125 MG tablet Take 1 tablet (0.125 mg total) by mouth daily.  DULoxetine (CYMBALTA) 60 MG capsule Take 60 mg by mouth daily.  esomeprazole (NEXIUM) 40 MG capsule Take 40 mg by mouth daily before breakfast.  hydrALAZINE (APRESOLINE) 25 MG tablet Take 3 tablets (75 mg total) by mouth every 8 (eight) hours.  insulin aspart (NOVOLOG) 100 UNIT/ML injection Inject 0-15 Units into the skin 3 (three) times  daily with meals. CBG 70 - 120: 0 units CBG 121 - 150: 2 units CBG 151 - 200: 3 units CBG 201 - 250: 5 units CBG 251 - 300: 8 units CBG 301 - 350: 11 units CBG 351 -  400: 15 units  insulin glargine (LANTUS) 100 UNIT/ML injection Inject 0.3-0.4 mLs (30-40 Units total) into the skin 2 (two) times daily. 40 U am, 30 U pm Patient taking differently: Inject 30-40 Units into the skin 2 (two) times daily. 30 U am, 50 U pm  isosorbide mononitrate (IMDUR) 30 MG 24 hr tablet Take 1 tablet (30 mg total) by mouth 2 (two) times daily.  lipase/protease/amylase (CREON-10/PANCREASE) 12000 UNITS CPEP Take 1-3 capsules by mouth 2 (two) times daily. 3 capsules before meal, and 1 capsule before snack  magnesium oxide (MAG-OX) 400 MG tablet Take 1 tablet (400 mg total) by mouth 3 (three) times daily.  metFORMIN (GLUCOPHAGE) 850 MG tablet Take 850 mg by mouth 2 (two) times daily.  Olopatadine HCl (PATADAY) 0.2 % SOLN Apply 1 drop to eye daily as needed. For allergies  pioglitazone (ACTOS) 15 MG tablet Take 15 mg by mouth daily.  pregabalin (LYRICA) 150 MG capsule Take 150 mg by mouth 2 (two) times daily.  simvastatin (ZOCOR) 10 MG tablet Take 10 mg by mouth daily at 6 PM.  spironolactone (ALDACTONE) 25 MG tablet Take 12.5 mg by mouth daily.  tiZANidine (ZANAFLEX) 4 MG tablet Take 4 mg by mouth 2 (two) times daily as needed for muscle spasms.  torsemide (DEMADEX) 20 MG tablet Take 1m daily alternating with 231mdaily.  traMADol (ULTRAM) 50 MG tablet Take 50 mg by mouth every 6 (six) hours as needed for moderate pain.   acetaminophen (TYLENOL) 500 MG tablet Take 2 tablets (1,000 mg total) by mouth every 6 (six) hours as needed for mild pain or headache.  Blood Glucose Monitoring Suppl (CVS BLOOD GLUCOSE METER) W/DEVICE KIT Check blood sugars 4 times daily, with meals and at bedtime.  glucose blood (CVS BLOOD GLUCOSE TEST STRIPS) test strip Use as instructed  milrinone (PRIMACOR) 20 MG/100ML SOLN infusion Inject  11.7375 mcg/min into the vein continuous.  D/c'd 01/25/2015    No Known Allergies  History   Social History  . Marital Status: Married    Spouse Name: N/A  . Number of Children: N/A  . Years of Education: N/A   Occupational History  . disabled    Social History Main Topics  . Smoking status: Current Every Day Smoker -- 1.00 packs/day for 30 years    Types: Cigarettes  . Smokeless tobacco: Never Used  . Alcohol Use: No     Comment: Quit after his brother's death in [id number] 8   . Drug Use: No  . Sexual Activity: Yes   Other Topics Concern  . Not on file   Social History Narrative   [place], [place] crutches or wheelchair to get around   Family History  Problem Relation Age of Onset  . Diabetes Mellitus II Sister   . CAD Brother   . Cancer Mother     ? type   . Cancer Father     ? type   Family Status  Relation Status Death Age  . Mother Deceased   . Father Deceased     PHYSICAL EXAM: Filed Vitals:   01/30/15 1545  BP: 132/70  Pulse: 76  Temp:   Resp: 17    Intake/Output Summary (Last 24 hours) at 01/30/15 1633 Last data filed at 01/30/15 1550  Gross per 24 hour  Intake      0 ml  Output    200 ml  Net   -200 ml    General:  Chronically ill appearing.  Lethargic but responsive No respiratory difficulty HEENT: normal Neck: supple. JVD 7-8 centimeters. Carotids 2+ bilat; no bruits. No lymphadenopathy or thryomegaly appreciated. Cor: PMI laterally displaced. Regular rate & rhythm.  Distant + s3 Lungs: Rales and some rhonchi, improves with cough and throat clearing Abdomen: soft, nontender, nondistended. No hepatosplenomegaly. No bruits or masses. Good bowel sounds. Extremities: no cyanosis, clubbing, rash, edema; pulses in lower extremities were not palpable and extremities are cool to touch distally Neuro: alert & oriented x 3, cranial nerves grossly intact. moves all 4 extremities w/o difficulty. Affect pleasant.  ECG: Accelerated  rhythm 76 QRS 177m  Results for orders placed or performed during the hospital encounter of 01/30/15 (from the past 24 hour(s))  I-stat troponin, ED     Status: Abnormal   Collection Time: 01/30/15 12:04 PM  Result Value Ref Range   Troponin i, poc 0.12 (HH) 0.00 - 0.08 ng/mL   Comment NOTIFIED PHYSICIAN    Comment 3          CBC with Differential/Platelet     Status: Abnormal   Collection Time: 01/30/15 12:05 PM  Result Value Ref Range   WBC 13.5 (H) 4.0 - 10.5 K/uL   RBC 5.13 4.22 - 5.81 MIL/uL   Hemoglobin 11.8 (L) 13.0 - 17.0 g/dL   HCT 38.3 (L) 39.0 - 52.0 %   MCV 74.7 (L) 78.0 - 100.0 fL   MCH 23.0 (L) 26.0 - 34.0 pg   MCHC 30.8 30.0 - 36.0 g/dL   RDW 19.7 (H) 11.5 - 15.5 %   Platelets 104 (L) 150 - 400 K/uL   Neutrophils Relative % 78 (H) 43 - 77 %   Lymphocytes Relative 10 (L) 12 - 46 %   Monocytes Relative 12 3 - 12 %   Eosinophils Relative 0 0 - 5 %   Basophils Relative 0 0 - 1 %   Neutro Abs 10.5 (H) 1.7 - 7.7 K/uL   Lymphs Abs 1.4 0.7 - 4.0 K/uL   Monocytes Absolute 1.6 (H) 0.1 - 1.0 K/uL   Eosinophils Absolute 0.0 0.0 - 0.7 K/uL   Basophils Absolute 0.0 0.0 - 0.1 K/uL   RBC Morphology RARE NRBCs   Basic metabolic panel     Status: Abnormal   Collection Time: 01/30/15 12:05 PM  Result Value Ref Range   Sodium 134 (L) 135 - 145 mmol/L   Potassium >7.5 (HH) 3.5 - 5.1 mmol/L   Chloride 96 (L) 101 - 111 mmol/L   CO2 16 (L) 22 - 32 mmol/L   Glucose, Bld 84 65 - 99 mg/dL   BUN 102 (H) 6 - 20 mg/dL   Creatinine, Ser 4.13 (H) 0.61 - 1.24 mg/dL   Calcium 9.3 8.9 - 10.3 mg/dL   GFR calc non Af Amer 15 (L) >60 mL/min   GFR calc Af Amer 18 (L) >60 mL/min   Anion gap 22 (H) 5 - 15  Digoxin level     Status: None   Collection Time: 01/30/15 12:05 PM  Result Value Ref Range   Digoxin Level 1.8 0.8 - 2.0 ng/mL  Brain natriuretic peptide     Status: Abnormal   Collection Time: 01/30/15 12:05 PM  Result Value Ref Range   B Natriuretic Peptide 3105.0 (H) 0.0 - 100.0  pg/mL  I-stat chem 8, ed     Status: Abnormal   Collection Time: 01/30/15 12:06 PM  Result Value Ref Range   Sodium 133 (L) 135 - 145 mmol/L   Potassium  7.5 (HH) 3.5 - 5.1 mmol/L   Chloride 99 (L) 101 - 111 mmol/L   BUN 89 (H) 6 - 20 mg/dL   Creatinine, Ser 4.30 (H) 0.61 - 1.24 mg/dL   Glucose, Bld 82 65 - 99 mg/dL   Calcium, Ion 1.06 (L) 1.12 - 1.23 mmol/L   TCO2 18 0 - 100 mmol/L   Hemoglobin 14.3 13.0 - 17.0 g/dL   HCT 42.0 39.0 - 52.0 %   Comment NOTIFIED PHYSICIAN   CBG monitoring, ED     Status: Abnormal   Collection Time: 01/30/15  3:40 PM  Result Value Ref Range   Glucose-Capillary 147 (H) 65 - 99 mg/dL   Comment 1 Notify RN    Comment 2 Document in Chart    Radiology:  Dg Chest Port 1 View 01/30/2015   CLINICAL DATA:  Shortness of breath and cough for 2 weeks.  EXAM: PORTABLE CHEST - 1 VIEW  COMPARISON:  11/17/2014 prior radiographs  FINDINGS: Cardiomegaly again noted.  A right PICC line is present coiled over the lower right neck.  Defibrillator pads overlying the left chest identified.  Mild right basilar atelectasis has decreased.  There is no evidence of focal airspace disease, pulmonary edema, suspicious pulmonary nodule/mass, pleural effusion, or pneumothorax. No acute bony abnormalities are identified.  IMPRESSION: Right PICC line coiled overlying the lower right neck.  Cardiomegaly without evidence of acute cardiopulmonary disease.  Decrease mild right basilar atelectasis.   Electronically Signed   By: Margarette Canada M.D.   On: 01/30/2015 12:15    ASSESSMENT: Active Problems:   Acute chronic systolic CHF (congestive heart failure) due to NICM   Acute renal failure (ARF)   AKI (acute kidney injury)   Hyperkalemia   Diabetes  PLAN/DISCUSSION: Christian Wall has a history of cardiomyopathy and cardiorenal syndrome, milrinone recently discontinued. After the milrinone was discontinued, he had problems with nausea and vomiting as well as diarrhea. By mouth intake was poor.  He is being admitted today with acute renal failure. M.D. advise on resuming milrinone at this time. Since the Renal team is seeing him, will leave volume management to them. We will continue to follow.  Christian Wall 01/30/2015 4:33 PM Beeper [phone]  Patient seen and examined with Rosaria Ferries, PA-C. We discussed all aspects of the encounter. I agree with the assessment and plan as stated above.   I agree with dr. Lake Bells that presentation very suggestive of cardiogenic shock with cardiorenal syndrome in setting of recently stopping milrinone. However, co-ox today suggests adequate perfusion and doesn't look markedly fluid overloaded on exam. Will hold off on starting milrinone for now. Agree with gentle hydration with sodium bicarb as the Renal team is doing. Once in ICU can follow CVP and co-ox.    Will likely need RHC/PA catheter tomorrow to further evaluate. I suspect he will need resumption of home milrinone. Appreciate CCM and Renal care.  Bensimhon, Daniel,MD 5:04 PM

## 2015-01-30 NOTE — Progress Notes (Signed)
I spoke with Cicero Duck, RN regarding PICC exchange order.   Suggested they get Interventional Radiology to reposition the PICC due to unavailability of a PICC nurse.   Cicero Duck, RN spoke with EDP who will have  "Interventional rewire it into position".

## 2015-01-30 NOTE — Progress Notes (Signed)
Spoke with Cicero Duck, RN in ED regarding x-ray showing PICC is looped in the IJ.   She said the EDP was aware and was going to put in an EJ so they could get meds started.  I suggested the PICC be removed or see if Interventional Radiology could reposition it if PICC still needed.

## 2015-01-30 NOTE — ED Notes (Signed)
Attempted report. Told to hold pt down in ED per house coverage and 67M.

## 2015-01-30 NOTE — Progress Notes (Signed)
CRITICAL VALUE ALERT  Critical value received: Lactic Acid 2.5  Date of notification:  01/30/15  Time of notification:  2135  Critical value read back:Yes Nurse who received alert:  Aquilla Solian RN/Seryna Marek Zenaida Deed, RN  MD notified (1st page): Dr. Wende Mott  Time of first page:  [phone]

## 2015-01-30 NOTE — ED Notes (Signed)
Admitted told RN to draw blood after medications administered. Medications were done at 1536.

## 2015-01-30 NOTE — ED Notes (Signed)
Attempted report 

## 2015-01-30 NOTE — H&P (Signed)
PULMONARY / CRITICAL CARE MEDICINE   Name: Christian Wall MRN: [id number] DOB: 1961/01/07    ADMISSION DATE:  54/26/2016 CONSULTATION DATE:  54/26/16  REFERRING MD :  EDP   CHIEF COMPLAINT:  N/V, weakness   INITIAL PRESENTATION: 54 y/o M, heavy smoker, with PMH of chronic systolic CHF with recent d/c of home Milrinone gtt (stopped 7/21) who presented to [facility] on 7/26 with complaints of worsening weakness, fatigue, decreased appetite, N/V and diarrhea (one episode of n/v/d on 7/23).  PCCM called for admission.    STUDIES:    SIGNIFICANT EVENTS: 7/26  Admit with weakness, fatigue, decreased appetite, N/V/D x1.  Recent d/c of Milrinone gtt on 7/21.     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    The patient reports Milrinone gtt was discontinued on 7/21 and since that time he has felt more fatigued.  He reported on 7/23, he had one episode of nausea / vomiting and diarrhea.  Since that time, he has had progressive fatigue, decreased appetite.  He denies fevers, chills, cough, chest pain, pain with inspiration.    ER work up was notable for Na 133, K >7.5, cl 99, sr Cr 4.30 (up from baseline of 1.2-1.4), BNP 3105, troponin 0.12, hgb 11.8, hct 38.3, platelets 104 (down from 325 in 11/2014).  CXR assessment showed a PICC line that appeared to be looped back on itself in the neck, cardiomegaly without overt  failure and mild R basilar atelectasis.    PCCM called for ICU admission.    PAST MEDICAL HISTORY :   has a past medical history of Diabetes mellitus; Atrophy of calf muscles; Depression; GERD (gastroesophageal reflux disease); Peripheral neuropathy; Hypercholesteremia; tracheostomy; and Chronic systolic CHF (congestive heart failure), NYHA class 3.  has past surgical history that includes Pancreas surgery (2001); Anterior cervical decomp/discectomy fusion (04/13/2012); and Cardiac catheterization (N/A, 11/15/2014).   Prior to Admission medications   Medication Sig Start Date End Date Taking? Authorizing Provider  buPROPion (WELLBUTRIN SR) 150 MG 12 hr tablet Take 1 tablet (150 mg total) by mouth 2 (two) times daily. 01/02/15  Yes Larey Dresser, MD  [facility] VITAMIN B12 1000 MCG tablet Take 1,000 mcg by mouth daily. 10/15/14  Yes Historical Provider, MD  digoxin (LANOXIN) 0.125 MG tablet Take 1 tablet (0.125 mg total) by mouth daily. 11/28/14  Yes Jolaine Artist, MD  DULoxetine (CYMBALTA) 60 MG capsule Take 60 mg by mouth daily.   Yes Historical Provider, MD  esomeprazole (NEXIUM) 40 MG capsule Take 40 mg by mouth daily before breakfast.   Yes Historical Provider, MD  hydrALAZINE (APRESOLINE) 25 MG tablet Take 3 tablets (75 mg total) by mouth every 8 (eight) hours. 11/21/14  Yes Rhonda G Barrett, PA-C  insulin aspart (NOVOLOG) 100 UNIT/ML injection Inject 0-15 Units into the skin 3 (three) times daily with meals. CBG 70 - 120: 0 units CBG 121 - 150: 2 units CBG 151 - 200: 3 units CBG 201 - 250: 5 units  CBG 251 - 300: 8 units CBG 301 - 350: 11 units CBG 351 - 400: 15 units 11/13/13  Yes Maryann Mikhail, DO  insulin glargine (LANTUS) 100 UNIT/ML injection Inject 0.3-0.4 mLs (30-40 Units total) into the skin 2 (two) times daily. 40 U am, 30 U pm Patient taking differently: Inject 30-40 Units into the skin 2 (two) times daily. 30 U am, 50 U pm 11/21/14  Yes Rhonda G Barrett, PA-C  isosorbide  mononitrate (IMDUR) 30 MG 24 hr tablet Take 1 tablet (30 mg total) by mouth 2 (two) times daily. 11/21/14  Yes Rhonda G Barrett, PA-C  lipase/protease/amylase (CREON-10/PANCREASE) 12000 UNITS CPEP Take 1-3 capsules by mouth 2 (two) times daily. 3 capsules before meal, and 1 capsule before snack   Yes Historical Provider, MD  magnesium oxide (MAG-OX) 400 MG tablet Take 1 tablet (400 mg total) by mouth 3 (three) times daily. 01/26/15  Yes Jolaine Artist, MD  metFORMIN (GLUCOPHAGE) 850 MG tablet Take 850 mg by mouth 2 (two) times daily. 01/16/15  Yes Historical Provider, MD  Olopatadine HCl (PATADAY) 0.2 % SOLN Apply 1 drop to eye daily as needed. For allergies   Yes Historical Provider, MD  pioglitazone (ACTOS) 15 MG tablet Take 15 mg by mouth daily. 01/16/15  Yes Historical Provider, MD  pregabalin (LYRICA) 150 MG capsule Take 150 mg by mouth 2 (two) times daily.   Yes Historical Provider, MD  simvastatin (ZOCOR) 10 MG tablet Take 10 mg by mouth daily at 6 PM.   Yes Historical Provider, MD  spironolactone (ALDACTONE) 25 MG tablet Take 12.5 mg by mouth daily. 01/16/15  Yes Historical Provider, MD  tiZANidine (ZANAFLEX) 4 MG tablet Take 4 mg by mouth 2 (two) times daily as needed for muscle spasms.   Yes Historical Provider, MD  torsemide (DEMADEX) 20 MG tablet Take 54m daily alternating with 244mdaily. 01/02/15  Yes DaLarey DresserMD  traMADol (ULTRAM) 50 MG tablet Take 50 mg by mouth every 6 (six) hours as needed for moderate pain.    Yes Historical Provider, MD  acetaminophen (TYLENOL) 500 MG tablet Take 2 tablets (1,000 mg total) by mouth every 6 (six) hours as needed for mild pain or headache. 11/21/14   RhEvelene Croonarrett, PA-C  Blood Glucose Monitoring Suppl ([facility]S BLOOD GLUCOSE METER) W/DEVICE KIT Check blood sugars 4 times daily, with meals and at bedtime. 11/13/13   Maryann Mikhail, DO  glucose blood ([facility]S BLOOD GLUCOSE TEST STRIPS) test strip Use as instructed 11/13/13   Maryann Mikhail, DO   milrinone (PRIMACOR) 20 MG/100ML SOLN infusion Inject 11.7375 mcg/min into the vein continuous. 01/10/15   Amy D Estrella DeedsNP   No Known Allergies  FAMILY HISTORY:  has no family status information on file.    SOCIAL HISTORY:  reports that he has been smoking Cigarettes.  He has a 30 pack-year smoking history. He has never used smokeless tobacco. He reports that he does not drink alcohol or use illicit drugs.  REVIEW OF SYSTEMS:  Gen: Denies fever, chills, weight change, night sweats.  Reports fatigue / malaise  HEENT: Denies blurred vision, double vision, hearing loss, tinnitus, sinus congestion, rhinorrhea, sore throat, neck stiffness, dysphagia PULM: Denies shortness of breath, cough, sputum production, hemoptysis, wheezing CV: Denies chest pain, edema, orthopnea, paroxysmal nocturnal dyspnea, palpitations GI: Denies abdominal pain,hematochezia, melena, constipation, change in bowel habits. Reports one isolated episode of  nausea, vomiting, diarrhea on 7/23, none further GU: Denies dysuria, hematuria, polyuria, oliguria, urethral discharge Endocrine:  Denies hot or cold intolerance, polyuria, polyphagia or appetite change Derm: Denies rash, dry skin, scaling or peeling skin change Heme: Denies easy bruising, bleeding, bleeding gums Neuro: Denies headache, numbness, weakness, slurred speech, loss of memory or consciousness   SUBJECTIVE:   VITAL SIGNS: Temp:  [97.9 F (36.6 C)] 97.9 F (36.6 C) (07/26 1114) Pulse Rate:  [74-100] 78 (07/26 1432) Resp:  [19-24] 24 (07/26 1432) BP: (113-138)/(72-88) 113/74 mmHg (07/26 1432) SpO2:  [92 %-100 %] 94 % (07/26 1432)   HEMODYNAMICS:     VENTILATOR SETTINGS:     INTAKE / OUTPUT: No intake or output data in the 24 hours ending 01/30/15 1507  PHYSICAL EXAMINATION: General:  Obese adult male in NAD Neuro:  AAOx4, speech clear, MAE HEENT:  MM pink/moist Cardiovascular:  s1s2 rrr, no m/r/g Lungs:  Even/non-labored, rhonchi bilaterally   Abdomen:  NTND, bsx4 active  Musculoskeletal: no acute deformities  Skin:  Warm/dry, no edema   LABS:  CBC  Recent Labs Lab 01/30/15 1205 01/30/15 1206  WBC 13.5*  --   HGB 11.8* 14.3  HCT 38.3* 42.0  PLT 104*  --    Coag's No results for input(s): APTT, INR in the last 168 hours.   BMET  Recent Labs Lab 01/30/15 1205 01/30/15 1206  NA 134* 133*  K >7.5* 7.5*  CL 96* 99*  CO2 16*  --   BUN 102* 89*  CREATININE 4.13* 4.30*  GLUCOSE 84 82   Electrolytes  Recent Labs Lab 01/30/15 1205  CALCIUM 9.3   Sepsis Markers No results for input(s): LATICACIDVEN, PROCALCITON, O2SATVEN in the last 168 hours.   ABG No results for input(s): PHART, PCO2ART, PO2ART in the last 168 hours.   Liver Enzymes No results for input(s): AST, ALT, ALKPHOS, BILITOT, ALBUMIN in the last 168 hours.   Cardiac Enzymes No results for input(s): TROPONINI, PROBNP in the last 168 hours.   Glucose No results for input(s): GLUCAP in the last 168 hours.  Imaging Dg Chest Port 1 View  01/30/2015   CLINICAL DATA:  Shortness of breath and cough for 2 weeks.  EXAM: PORTABLE CHEST - 1 VIEW  COMPARISON:  11/17/2014 prior radiographs  FINDINGS: Cardiomegaly again noted.  A right PICC line is present coiled over the lower right neck.  Defibrillator pads overlying the left chest identified.  Mild right basilar atelectasis has decreased.  There is no evidence of focal airspace disease, pulmonary edema, suspicious pulmonary nodule/mass, pleural effusion, or pneumothorax. No acute bony abnormalities are identified.  IMPRESSION: Right PICC line coiled overlying the lower right neck.  Cardiomegaly without evidence of acute cardiopulmonary disease.  Decrease mild right basilar atelectasis.   Electronically Signed   By: Margarette Canada M.D.   On: 01/30/2015 12:15     ASSESSMENT / PLAN:  PULMONARY OETT A: Tobacco Abuse  Hx Tracheostomy P:   Smoking cessation counseling  Oxygen as needed to ensure  saturation > 90% Pulmonary hygiene Intermittent CXR  CARDIOVASCULAR RUE PICC 5/14 >>  A:  Chronic Systolic CHF (NICM), NYHA III - previously on Milrinone, d/c'd 7/21 P:  Assess STAT co-ox  Trend BNP, troponin  CHF SVC consulted  Daily weights Continue digoxin (level wnl on admit) Continue hydralazine, zocor Hold home imdur, aldactone, demadex Have asked IR to replace PICC line  RENAL A:   CKD III Hyperkalemia - CHF notes reflect no spironolactone but is on home med list.  ACE-I stopped 12/18/14 per Dr. Melvyn Novas Hypomagnesemia  Hyponatremia P:  Hold home spironolactone  D5 with 3 amps bicarb at 27m/hr, may need to slow rate down if suspected CHF exacerbation  Assess urine sodium, creatinine Renal [place] Nephrology consult  Assess lactic acid  Strict I/O's Trend BMP / UOP  Replace electrolytes as indicated   GASTROINTESTINAL A:   GERD P:   Renal diet as tolerated   HEMATOLOGIC A:   IDA  Thrombocytopenia  P:  DVT: SCD's  Monitor CBC  INFECTIOUS A:   No acute infectious process P:   Monitor fever curve / leukocytosis   ENDOCRINE A:   DM II   P:   Hold home metformin  SSI sensitive scale for now  NEUROLOGIC A:   Depression Peripheral Neuropathy   P:   RASS goal: n/a Supportive care Hold home wellbutrin   FAMILY  - Updates: wife updated at bedside by Dr. MMoreen Fowler NP-C [place]Pgr: [phone] or if no answer [phone]/26/2016, 3:07 PM

## 2015-01-30 NOTE — ED Notes (Signed)
CBG 147 

## 2015-01-31 DIAGNOSIS — I5023 Acute on chronic systolic (congestive) heart failure: Secondary | ICD-10-CM | POA: Diagnosis present

## 2015-01-31 LAB — GLUCOSE, CAPILLARY
GLUCOSE-CAPILLARY: 125 mg/dL — AB (ref 65–99)
GLUCOSE-CAPILLARY: 144 mg/dL — AB (ref 65–99)
Glucose-Capillary: 153 mg/dL — ABNORMAL HIGH (ref 65–99)
Glucose-Capillary: 152 mg/dL — ABNORMAL HIGH (ref 65–99)
Glucose-Capillary: 161 mg/dL — ABNORMAL HIGH (ref 65–99)
Glucose-Capillary: 117 mg/dL — ABNORMAL HIGH (ref 65–99)

## 2015-01-31 LAB — RENAL FUNCTION PANEL
ALBUMIN: 3.2 g/dL — AB (ref 3.5–5.0)
ANION GAP: 12 (ref 5–15)
BUN: 100 mg/dL — ABNORMAL HIGH (ref 6–20)
CALCIUM: 8.9 mg/dL (ref 8.9–10.3)
CO2: 28 mmol/L (ref 22–32)
Chloride: 95 mmol/L — ABNORMAL LOW (ref 101–111)
Creatinine, Ser: 3.17 mg/dL — ABNORMAL HIGH (ref 0.61–1.24)
GFR calc non Af Amer: 21 mL/min — ABNORMAL LOW (ref >60)
GFR, EST AFRICAN AMERICAN: 24 mL/min — AB (ref >60)
Glucose, Bld: 157 mg/dL — ABNORMAL HIGH (ref 65–99)
Phosphorus: 3 mg/dL (ref 2.5–4.6)
Potassium: 4.7 mmol/L (ref 3.5–5.1)
Sodium: 135 mmol/L (ref 135–145)

## 2015-01-31 LAB — CBC
HCT: 32.9 % — ABNORMAL LOW (ref 39.0–52.0)
HEMOGLOBIN: 10.3 g/dL — AB (ref 13.0–17.0)
MCH: 22.9 pg — ABNORMAL LOW (ref 26.0–34.0)
MCHC: 31.3 g/dL (ref 30.0–36.0)
MCV: 73.1 fL — ABNORMAL LOW (ref 78.0–100.0)
Platelets: 102 10*3/uL — ABNORMAL LOW (ref 150–400)
RBC: 4.5 MIL/uL (ref 4.22–5.81)
RDW: 19.7 % — ABNORMAL HIGH (ref 11.5–15.5)
WBC: 11.1 10*3/uL — ABNORMAL HIGH (ref 4.0–10.5)

## 2015-01-31 LAB — MAGNESIUM: Magnesium: 1.8 mg/dL (ref 1.7–2.4)

## 2015-01-31 NOTE — Progress Notes (Signed)
PULMONARY / CRITICAL CARE MEDICINE   Name: Christian Wall MRN: [id number] DOB: 01-09-1961    ADMISSION DATE:  01/30/2015 CONSULTATION DATE:  01/31/2015  ADMISSION DATE: 01/30/2015 CONSULTATION DATE: 01/30/15  REFERRING MD : EDP   CHIEF COMPLAINT: N/V, weakness   INITIAL PRESENTATION: 54 y/o M, heavy smoker, with PMH of chronic systolic CHF with recent d/c of home Milrinone gtt (stopped 7/21) who presented to [facility] on 7/26 with complaints of worsening weakness, fatigue, decreased appetite, N/V and diarrhea (one episode of n/v/d on 7/23). PCCM called for admission.   STUDIES:  US renal 7/26>> Negative for hydronephrosis. Mildly increased cortical echogenicity suggestive of medical renal disease. Small volume of perihepatic ascites. Debris within the urinary bladder.  SIGNIFICANT EVENTS: 7/26 Admit with weakness, fatigue, decreased appetite, N/V/D x1. Recent d/c of Milrinone gtt on 7/21.   Subjective:  NAE overnight  VITAL SIGNS: Temp:  [97.5 F (36.4 C)-98.4 F (36.9 C)] 97.5 F (36.4 C) (07/27 0300) Pulse Rate:  [71-100] 85 (07/27 0800) Resp:  [14-24] 19 (07/27 0800) BP: (104-145)/(53-102) 120/102 mmHg (07/27 0800) SpO2:  [87 %-100 %] 97 % (07/27 0800) Weight:  [187 lb 13.3 oz (85.2 kg)-191 lb 12.8 oz (87 kg)] 187 lb 13.3 oz (85.2 kg) (07/27 0341) HEMODYNAMICS:   VENTILATOR SETTINGS:   INTAKE / OUTPUT:  Intake/Output Summary (Last 24 hours) at 01/31/15 0838 Last data filed at 01/31/15 0800  Gross per 24 hour  Intake    900 ml  Output   2335 ml  Net  -1435 ml    PHYSICAL EXAMINATION: General:  NAD Neuro:  AOx3 HEENT:  NCAT Cardiovascular:  RRR Lungs:  coarse breath sounds bilaterally Abdomen:  Soft, non tender Musculoskeletal:  No deformities Extremities:no LE edema Skin:  No lesions  LABS:  CBC  Recent Labs Lab 01/30/15 1205 01/30/15 1206 01/31/15 0236  WBC 13.5*  --  11.1*  HGB 11.8* 14.3 10.3*  HCT 38.3* 42.0 32.9*  PLT 104*  --   102*   Coag's No results for input(s): APTT, INR in the last 168 hours. BMET  Recent Labs Lab 01/30/15 1620 01/30/15 2020 01/31/15 0236  NA 135 133* 135  K 6.4* 5.9* 4.7  CL 97* 95* 95*  CO2 BUN 101* 106* 100*  CREATININE 4.04* 3.72* 3.17*  GLUCOSE 152* 168* 157*   Electrolytes  Recent Labs Lab 01/30/15 1620 01/30/15 2020 01/31/15 0236  CALCIUM 9.5 9.1 8.9  MG  --   --  1.8  PHOS  --   --  3.0   Sepsis Markers  Recent Labs Lab 01/30/15 2020  LATICACIDVEN 2.5*   ABG No results for input(s): PHART, PCO2ART, PO2ART in the last 168 hours. Liver Enzymes  Recent Labs Lab 01/31/15 0236  ALBUMIN 3.2*   Cardiac Enzymes No results for input(s): TROPONINI, PROBNP in the last 168 hours. Glucose  Recent Labs Lab 01/30/15 1540 01/30/15 1804 01/30/15 2024 01/30/15 2327 01/31/15 0306  GLUCAP 147* 166* 163* 153* 152*    Imaging US Renal  01/30/2015   CLINICAL DATA:  Acute renal failure.  EXAM: RENAL / URINARY TRACT ULTRASOUND COMPLETE  COMPARISON:  Abdomen and pelvis CT scan 11/11/2013. Renal ultrasound 11/14/2014.  FINDINGS: Right Kidney:  Length: 12.3 cm. Cortical echogenicity is mildly increased. No stone or hydronephrosis. Perihepatic ascites is noted.  Left Kidney:  Length: 11.6 cm. No hydronephrosis. Cortical echogenicity is mildly increased. Two simple lower pole cysts are identified measuring 4.1 and 3.1 cm in diameter.  Bladder:  There is some debris within urinary bladder.  IMPRESSION: Negative for hydronephrosis.  Mildly increased cortical echogenicity suggestive of medical renal disease.  Small volume of perihepatic ascites.  Debris within the urinary bladder.   Electronically Signed   By: Drusilla Kanner M.D.   On: 01/30/2015 20:31   Dg Chest Port 1 View  01/30/2015   CLINICAL DATA:  Shortness of breath and cough for 2 weeks.  EXAM: PORTABLE CHEST - 1 VIEW  COMPARISON:  11/17/2014 prior radiographs  FINDINGS: Cardiomegaly again noted.  A right  PICC line is present coiled over the lower right neck.  Defibrillator pads overlying the left chest identified.  Mild right basilar atelectasis has decreased.  There is no evidence of focal airspace disease, pulmonary edema, suspicious pulmonary nodule/mass, pleural effusion, or pneumothorax. No acute bony abnormalities are identified.  IMPRESSION: Right PICC line coiled overlying the lower right neck.  Cardiomegaly without evidence of acute cardiopulmonary disease.  Decrease mild right basilar atelectasis.   Electronically Signed   By: Harmon Pier M.D.   On: 01/30/2015 12:15     ASSESSMENT / PLAN:  PULMONARY OETT A: Tobacco Abuse  Hx Tracheostomy Not overloaded P:  Smoking cessation counseling  Oxygen as needed to ensure saturation > 90% Pulmonary hygiene Intermittent CXR  CARDIOVASCULAR RUE PICC 5/14 >>  A:  Chronic Systolic CHF (NICM), NYHA III - previously on Milrinone, d/c'd 7/21 P:   CHF SVC consulted , appreciate recs Daily weights Continue digoxin (level wnl on admit) Continue hydralazine, zocor Hold home imdur, aldactone, demadex IR to replace PICC today  RENAL A:  AKI on CKD III- improving Hyperkalemia - CHF notes reflect no spironolactone but is on home med list. ACE-I stopped 12/18/14 per Dr. Sherene Sires Hypomagnesemia - resolved Hyponatremia - resolved Elevated Lactate P:  Hold home spironolactone per Renal S/p bicarb drip - dc Nephrology consult , appreciate recs Strict I/O's Trend BMP / UOP  Replace electrolytes as indicated  Neg balance goals  GASTROINTESTINAL A:  GERD P:  NPO  HEMATOLOGIC A:  IDA  Thrombocytopenia - stable P:  DVT: SCD's  Monitor CBC  INFECTIOUS A:  No acute infectious process P:  Monitor fever curve / leukocytosis   ENDOCRINE A:  DM II  P:  Hold home metformin  SSI sensitive scale for now  NEUROLOGIC A:  Depression Peripheral Neuropathy  P:  RASS goal: n/a Supportive  care Hold home wellbutrin  FAMILY  - Updates:   Alyssa A. Kennon Rounds MD, MS Family Medicine Resident PGY-2 Pager [phone]  01/31/2015, 8:38 AM  STAFF NOTE: I, Rory Percy, MD FACP have personally reviewed patient's available data, including medical history, events of note, physical examination and test results as part of my evaluation. I have discussed with resident/NP and other care providers such as pharmacist, RN and RRT. In addition, I personally evaluated patient and elicited key findings of: no distress, limited crackles, no pressors, lasix neg balance improved crt and K , recheck K in am , no milrinone restart noted, to tele triad  Mcarthur Rossetti. Tyson Alias, MD, FACP Pgr: [phone] [facility] 01/31/2015 12:12 PM  \

## 2015-01-31 NOTE — Progress Notes (Signed)
PICC inaccessible, unable to obtain CVP.  MD notified

## 2015-01-31 NOTE — Progress Notes (Signed)
Pt has increased urine output, informed Dr. Wende Mott.

## 2015-01-31 NOTE — Progress Notes (Signed)
Pharmacist Heart Failure Core Measure Documentation  Assessment: Christian Wall has an EF documented as 20-25% on 11/13/14 by ECHO.  Rationale: Heart failure patients with left ventricular systolic dysfunction (LVSD) and an EF < 40% should be prescribed an angiotensin converting enzyme inhibitor (ACEI) or angiotensin receptor blocker (ARB) at discharge unless a contraindication is documented in the medical record.  This patient is not currently on an ACEI or ARB for HF.  This note is being placed in the record in order to provide documentation that a contraindication to the use of these agents is present for this encounter.  ACE Inhibitor or Angiotensin Receptor Blocker is contraindicated (specify all that apply)  []   ACEI allergy AND ARB allergy []   Angioedema []   Moderate or severe aortic stenosis []   Hyperkalemia []   Hypotension []   Renal artery stenosis [x]   Worsening renal function, preexisting renal disease or dysfunction   Vinnie Level, PharmD., BCPS Clinical Pharmacist Pager [phone]

## 2015-01-31 NOTE — Progress Notes (Signed)
Subjective: Christian Wall is a 54 yo male admitted with acute renal failure. Patient was seen and examined this morning. Patient is somnolent, but denies complaints.   Objective: Filed Vitals:   01/31/15 0900 01/31/15 0902 01/31/15 1000 01/31/15 1100  BP: 109/90  127/66 135/64  Pulse: 86  87 88  Temp:  99 F (37.2 C)    TempSrc:  Oral    Resp: 16  14 18   Weight:      SpO2: 97%  96% 95%   General: Vital signs reviewed. Patient is somnolent, well-developed and well-nourished, in no acute distress and cooperative with exam.  Cardiovascular: RRR, S1 normal, S2 normal, no murmurs, gallops, or rubs. Pulmonary/Chest: Diffuse rhonchi, coarse breath sounds, bilateral crackles in lower lung fields.  Abdominal: Soft, non-tender, non-distended, BS + Extremities: No lower extremity edema bilaterally, pulses symmetric and intact bilaterally.  Neurological: A&O x3 Psychiatric: Lethargic  Intake/Output from previous day: 07/26 0701 - 07/27 0700 In: 975 [I.V.:975] Out: 2035 [Urine:2035] Intake/Output this shift: Total I/O In: -  Out: 700 [Urine:700]  Lab Results:  Recent Labs  01/30/15 1205 01/30/15 1206 01/31/15 0236  WBC 13.5*  --  11.1*  HGB 11.8* 14.3 10.3*  HCT 38.3* 42.0 32.9*  PLT 104*  --  102*   BMET:   Recent Labs  01/30/15 2020 01/31/15 0236  NA 133* 135  K 5.9* 4.7  CL 95* 95*  CO2 22 28  GLUCOSE 168* 157*  BUN 106* 100*  CREATININE 3.72* 3.17*  CALCIUM 9.1 8.9   No results for input(s): PTH in the last 72 hours. Iron Studies: No results for input(s): IRON, TIBC, TRANSFERRIN, FERRITIN in the last 72 hours. CBG (last 3)   Recent Labs  01/30/15 2327 01/31/15 0306 01/31/15 0901  GLUCAP 153* 152* 161*   Studies/Results: US Renal  01/30/2015   CLINICAL DATA:  Acute renal failure.  EXAM: RENAL / URINARY TRACT ULTRASOUND COMPLETE  COMPARISON:  Abdomen and pelvis CT scan 11/11/2013. Renal ultrasound 11/14/2014.  FINDINGS: Right Kidney:  Length: 12.3 cm.  Cortical echogenicity is mildly increased. No stone or hydronephrosis. Perihepatic ascites is noted.  Left Kidney:  Length: 11.6 cm. No hydronephrosis. Cortical echogenicity is mildly increased. Two simple lower pole cysts are identified measuring 4.1 and 3.1 cm in diameter.  Bladder:  There is some debris within urinary bladder.  IMPRESSION: Negative for hydronephrosis.  Mildly increased cortical echogenicity suggestive of medical renal disease.  Small volume of perihepatic ascites.  Debris within the urinary bladder.   Electronically Signed   By: Inge Rise M.D.   On: 01/30/2015 20:31   Dg Chest Port 1 View  01/30/2015   CLINICAL DATA:  Shortness of breath and cough for 2 weeks.  EXAM: PORTABLE CHEST - 1 VIEW  COMPARISON:  11/17/2014 prior radiographs  FINDINGS: Cardiomegaly again noted.  A right PICC line is present coiled over the lower right neck.  Defibrillator pads overlying the left chest identified.  Mild right basilar atelectasis has decreased.  There is no evidence of focal airspace disease, pulmonary edema, suspicious pulmonary nodule/mass, pleural effusion, or pneumothorax. No acute bony abnormalities are identified.  IMPRESSION: Right PICC line coiled overlying the lower right neck.  Cardiomegaly without evidence of acute cardiopulmonary disease.  Decrease mild right basilar atelectasis.   Electronically Signed   By: Margarette Canada M.D.   On: 01/30/2015 12:15    I have reviewed the patient's current medications. Prior to Admission:  Prescriptions prior to admission  Medication Sig  Dispense Refill Last Dose  . buPROPion (WELLBUTRIN SR) 150 MG 12 hr tablet Take 1 tablet (150 mg total) by mouth 2 (two) times daily. 60 tablet 3 Past Month at Unknown time  . CVS VITAMIN B12 1000 MCG tablet Take 1,000 mcg by mouth daily.  5 01/29/2015 at Unknown time  . digoxin (LANOXIN) 0.125 MG tablet Take 1 tablet (0.125 mg total) by mouth daily. 30 tablet 3 01/29/2015 at Unknown time  . DULoxetine  (CYMBALTA) 60 MG capsule Take 60 mg by mouth daily.   01/29/2015 at Unknown time  . esomeprazole (NEXIUM) 40 MG capsule Take 40 mg by mouth daily before breakfast.   01/29/2015 at Unknown time  . hydrALAZINE (APRESOLINE) 25 MG tablet Take 3 tablets (75 mg total) by mouth every 8 (eight) hours. 270 tablet 11 01/29/2015 at Unknown time  . insulin aspart (NOVOLOG) 100 UNIT/ML injection Inject 0-15 Units into the skin 3 (three) times daily with meals. CBG 70 - 120: 0 units CBG 121 - 150: 2 units CBG 151 - 200: 3 units CBG 201 - 250: 5 units CBG 251 - 300: 8 units CBG 301 - 350: 11 units CBG 351 - 400: 15 units 10 mL 11 Past Week at Unknown time  . insulin glargine (LANTUS) 100 UNIT/ML injection Inject 0.3-0.4 mLs (30-40 Units total) into the skin 2 (two) times daily. 40 U am, 30 U pm (Patient taking differently: Inject 30-40 Units into the skin 2 (two) times daily. 30 U am, 50 U pm) 10 mL 11 01/29/2015 at Unknown time  . isosorbide mononitrate (IMDUR) 30 MG 24 hr tablet Take 1 tablet (30 mg total) by mouth 2 (two) times daily. 60 tablet 11 01/29/2015 at Unknown time  . lipase/protease/amylase (CREON-10/PANCREASE) 12000 UNITS CPEP Take 1-3 capsules by mouth 2 (two) times daily. 3 capsules before meal, and 1 capsule before snack   01/29/2015 at Unknown time  . magnesium oxide (MAG-OX) 400 MG tablet Take 1 tablet (400 mg total) by mouth 3 (three) times daily. 90 tablet 3 01/29/2015 at Unknown time  . metFORMIN (GLUCOPHAGE) 850 MG tablet Take 850 mg by mouth 2 (two) times daily.  5 01/29/2015 at Unknown time  . Olopatadine HCl (PATADAY) 0.2 % SOLN Apply 1 drop to eye daily as needed. For allergies   Past Week at Unknown time  . pioglitazone (ACTOS) 15 MG tablet Take 15 mg by mouth daily.  5 01/29/2015 at Unknown time  . pregabalin (LYRICA) 150 MG capsule Take 150 mg by mouth 2 (two) times daily.   01/29/2015 at Unknown time  . simvastatin (ZOCOR) 10 MG tablet Take 10 mg by mouth daily at 6 PM.   01/29/2015 at Unknown  time  . spironolactone (ALDACTONE) 25 MG tablet Take 12.5 mg by mouth daily.  11 01/29/2015 at Unknown time  . tiZANidine (ZANAFLEX) 4 MG tablet Take 4 mg by mouth 2 (two) times daily as needed for muscle spasms.   Past Month at Unknown time  . torsemide (DEMADEX) 20 MG tablet Take 32m daily alternating with 250mdaily. 90 tablet 11 01/29/2015 at Unknown time  . traMADol (ULTRAM) 50 MG tablet Take 50 mg by mouth every 6 (six) hours as needed for moderate pain.    Unk  . acetaminophen (TYLENOL) 500 MG tablet Take 2 tablets (1,000 mg total) by mouth every 6 (six) hours as needed for mild pain or headache. 30 tablet 0 Unk  . Blood Glucose Monitoring Suppl (CVS BLOOD GLUCOSE METER) W/DEVICE KIT  Check blood sugars 4 times daily, with meals and at bedtime. 1 kit 0 Taking  . glucose blood (CVS BLOOD GLUCOSE TEST STRIPS) test strip Use as instructed 100 each 12 Taking  . milrinone (PRIMACOR) 20 MG/100ML SOLN infusion Inject 11.7375 mcg/min into the vein continuous. 250 mL 12 01/26/2015   Scheduled: . antiseptic oral rinse  7 mL Mouth Rinse BID  . digoxin  0.125 mg Oral Daily  . hydrALAZINE  75 mg Oral 3 times per day  . insulin aspart  0-9 Units Subcutaneous 6 times per day  . simvastatin  10 mg Oral q1800   Continuous:   BXI:DHWYSH chloride  Assessment/Plan:  Acute on Chronic Kidney Disease: Improving, Cr has trended 4.13>3.17 this morning. Unclear baseline, appears to be 1.5-2.5. US showed increased cortical echogenicity of possible renal disease. No hydronephrosis. FENa 1.8% indicating intrinsic renal disease. UOP -2035 yesterday. UA remarkable for moderate leuks, trace hgb.  -Monitor UOP with strict I/Os -Repeat renal function panel tomorrow am -Can d/c bicarb gtt  Hyperkalemia: Likely secondary to acute on chronic kidney disease and concomitant use of spironolactone 12.5 mg daily. Improved to 4.7 this morning.  -Repeat renal function panel tomorrow am -Hold spironolactone  Chronic  Congestive Heart Failure: Patient recently taken off of milrinone on 01/25/15. Cardiology favors holding off on milrinone at co-ox level was normal.  -Management per PCCM and Advance HF Team   LOS: 1 day   Osa Craver, DO PGY-2 Internal Medicine Resident Pager # [phone] 01/31/2015 11:30 AM

## 2015-01-31 NOTE — Progress Notes (Signed)
Spoke with primary RN regarding malpositioned PICC needing to be resolved.   He is going to speak with the MD regarding having Interventional Radiology reposition the PICC.

## 2015-01-31 NOTE — ED Provider Notes (Signed)
CSN: [id number]     Arrival date & time 01/30/15  1106 History   First MD Initiated Contact with Patient 01/30/15 [id number]     Chief Complaint  Patient presents with  . Weakness     (Consider location/radiation/quality/duration/timing/severity/associated sxs/prior Treatment) HPI Comments: 54 year old male with extensive past medical history including chronic systolic CHF, IDDM, chronic kidney disease who presents with weakness. History obtained partially from the patient's wife and limited due to the patient's critical condition and need for resuscitation. Wife states that the patient has been at home with a PICC line receiving IV Lasix and milrinone. His milrinone was stopped approximately 3 days ago. The following day, he began feeling generally weak. The patient states that his weakness is global and he denies any focal arm or leg weakness. He denies any chest pain or shortness of breath. No fevers, vomiting, or abdominal pain. He did have a brief episode of diarrhea which improved with Imodium. He has been taking all medications as prescribed with the exception of today's morning medications.  Patient is a 54 y.o. male presenting with weakness. The history is provided by the patient and the spouse. The history is limited by the condition of the patient.  Weakness    Past Medical History  Diagnosis Date  . Diabetes mellitus   . Atrophy of calf muscles   . Depression   . GERD (gastroesophageal reflux disease)   . Peripheral neuropathy   . Hypercholesteremia   . Hx of tracheostomy   . Chronic systolic CHF (congestive heart failure), NYHA class 3    Past Surgical History  Procedure Laterality Date  . Pancreas surgery  09/09/99    hx pancreatitis  . Anterior cervical decomp/discectomy fusion  04/13/2012    Procedure: ANTERIOR CERVICAL DECOMPRESSION/DISCECTOMY FUSION 1 LEVEL;  Surgeon: Charlie Pitter, MD;  Location: [facility];  Service: Neurosurgery;  Laterality: N/A;  Cervcial Five-Six  Anterior Cervical Decompression and Fusion with Allograft and Plating  . Cardiac catheterization N/A 11/15/2014    Procedure: Right Heart Cath;  Surgeon: Jolaine Artist, MD;  Location: [facility];  Service: Cardiovascular;  Laterality: N/A;   Family History  Problem Relation Age of Onset  . Diabetes Mellitus II Sister   . CAD Brother   . Cancer Mother     ? type   . Cancer Father     ? type   History  Substance Use Topics  . Smoking status: Current Every Day Smoker -- 1.00 packs/day for 30 years    Types: Cigarettes  . Smokeless tobacco: Never Used  . Alcohol Use: No     Comment: Quit after his brother's death in 08-Sep-2005 or 8     Review of Systems  Neurological: Positive for weakness.  10 Systems reviewed and are negative for acute change except as noted in the HPI.     Allergies  Review of patient's allergies indicates no known allergies.  Home Medications   Prior to Admission medications   Medication Sig Start Date End Date Taking? Authorizing Provider  buPROPion (WELLBUTRIN SR) 150 MG 12 hr tablet Take 1 tablet (150 mg total) by mouth 2 (two) times daily. 01/02/15  Yes Larey Dresser, MD  CVS VITAMIN B12 1000 MCG tablet Take 1,000 mcg by mouth daily. 10/15/14  Yes Historical Provider, MD  digoxin (LANOXIN) 0.125 MG tablet Take 1 tablet (0.125 mg total) by mouth daily. 11/28/14  Yes Jolaine Artist, MD  DULoxetine (CYMBALTA) 60 MG capsule Take  60 mg by mouth daily.   Yes Historical Provider, MD  esomeprazole (NEXIUM) 40 MG capsule Take 40 mg by mouth daily before breakfast.   Yes Historical Provider, MD  hydrALAZINE (APRESOLINE) 25 MG tablet Take 3 tablets (75 mg total) by mouth every 8 (eight) hours. 11/21/14  Yes Rhonda G Barrett, PA-C  insulin aspart (NOVOLOG) 100 UNIT/ML injection Inject 0-15 Units into the skin 3 (three) times daily with meals. CBG 70 - 120: 0 units CBG 121 - 150: 2 units CBG 151 - 200: 3 units CBG 201 - 250: 5 units CBG 251 - 300: 8  units CBG 301 - 350: 11 units CBG 351 - 400: 15 units 11/13/13  Yes Maryann Mikhail, DO  insulin glargine (LANTUS) 100 UNIT/ML injection Inject 0.3-0.4 mLs (30-40 Units total) into the skin 2 (two) times daily. 40 U am, 30 U pm Patient taking differently: Inject 30-40 Units into the skin 2 (two) times daily. 30 U am, 50 U pm 11/21/14  Yes Rhonda G Barrett, PA-C  isosorbide mononitrate (IMDUR) 30 MG 24 hr tablet Take 1 tablet (30 mg total) by mouth 2 (two) times daily. 11/21/14  Yes Rhonda G Barrett, PA-C  lipase/protease/amylase (CREON-10/PANCREASE) 12000 UNITS CPEP Take 1-3 capsules by mouth 2 (two) times daily. 3 capsules before meal, and 1 capsule before snack   Yes Historical Provider, MD  magnesium oxide (MAG-OX) 400 MG tablet Take 1 tablet (400 mg total) by mouth 3 (three) times daily. 01/26/15  Yes Jolaine Artist, MD  metFORMIN (GLUCOPHAGE) 850 MG tablet Take 850 mg by mouth 2 (two) times daily. 01/16/15  Yes Historical Provider, MD  Olopatadine HCl (PATADAY) 0.2 % SOLN Apply 1 drop to eye daily as needed. For allergies   Yes Historical Provider, MD  pioglitazone (ACTOS) 15 MG tablet Take 15 mg by mouth daily. 01/16/15  Yes Historical Provider, MD  pregabalin (LYRICA) 150 MG capsule Take 150 mg by mouth 2 (two) times daily.   Yes Historical Provider, MD  simvastatin (ZOCOR) 10 MG tablet Take 10 mg by mouth daily at 6 PM.   Yes Historical Provider, MD  spironolactone (ALDACTONE) 25 MG tablet Take 12.5 mg by mouth daily. 01/16/15  Yes Historical Provider, MD  tiZANidine (ZANAFLEX) 4 MG tablet Take 4 mg by mouth 2 (two) times daily as needed for muscle spasms.   Yes Historical Provider, MD  torsemide (DEMADEX) 20 MG tablet Take 76m daily alternating with 275mdaily. 01/02/15  Yes DaLarey DresserMD  traMADol (ULTRAM) 50 MG tablet Take 50 mg by mouth every 6 (six) hours as needed for moderate pain.    Yes Historical Provider, MD  acetaminophen (TYLENOL) 500 MG tablet Take 2 tablets (1,000 mg total)  by mouth every 6 (six) hours as needed for mild pain or headache. 11/21/14   RhEvelene Croonarrett, PA-C  Blood Glucose Monitoring Suppl (CVS BLOOD GLUCOSE METER) W/DEVICE KIT Check blood sugars 4 times daily, with meals and at bedtime. 11/13/13   Maryann Mikhail, DO  glucose blood (CVS BLOOD GLUCOSE TEST STRIPS) test strip Use as instructed 11/13/13   Maryann Mikhail, DO  milrinone (PRIMACOR) 20 MG/100ML SOLN infusion Inject 11.7375 mcg/min into the vein continuous. 01/10/15   Amy D Clegg, NP   BP 135/64 mmHg  Pulse 88  Temp(Src) 99 F (37.2 C) (Oral)  Resp 18  Wt 187 lb 13.3 oz (85.2 kg)  SpO2 95% Physical Exam  Constitutional: He is oriented to person, place, and time. He appears well-developed  and well-nourished. No distress.  Quiet, eyes closed but responds appropriately  HENT:  Head: Normocephalic and atraumatic.  Moist mucous membranes  Eyes: Conjunctivae are normal. Pupils are equal, round, and reactive to light.  Neck: Neck supple.  Cardiovascular: Regular rhythm and normal heart sounds.   Mild tachycardia, 3/6 systolic murmur  Pulmonary/Chest: Effort normal. No respiratory distress.  Crackles and diminished breath sounds bilaterally  Abdominal: Soft. Bowel sounds are normal. He exhibits no distension. There is no tenderness.  Musculoskeletal:  Trace b/l LE edema  Neurological: He is alert and oriented to person, place, and time. He exhibits normal muscle tone.  Fluent speech  Skin: Skin is warm and dry.  Psychiatric: He has a normal mood and affect. Judgment normal.  Nursing note and vitals reviewed.   ED Course  Procedures (including critical care time)   Labs Review Labs Reviewed  CBC WITH DIFFERENTIAL/PLATELET - Abnormal; Notable for the following:    WBC 13.5 (*)    Hemoglobin 11.8 (*)    HCT 38.3 (*)    MCV 74.7 (*)    MCH 23.0 (*)    RDW 19.7 (*)    Platelets 104 (*)    Neutrophils Relative % 78 (*)    Lymphocytes Relative 10 (*)    Neutro Abs 10.5 (*)     Monocytes Absolute 1.6 (*)    All other components within normal limits  BASIC METABOLIC PANEL - Abnormal; Notable for the following:    Sodium 134 (*)    Potassium >7.5 (*)    Chloride 96 (*)    CO2 16 (*)    BUN 102 (*)    Creatinine, Ser 4.13 (*)    GFR calc non Af Amer 15 (*)    GFR calc Af Amer 18 (*)    Anion gap 22 (*)    All other components within normal limits  BRAIN NATRIURETIC PEPTIDE - Abnormal; Notable for the following:    B Natriuretic Peptide 3105.0 (*)    All other components within normal limits  CARBOXYHEMOGLOBIN - Abnormal; Notable for the following:    Total hemoglobin 11.2 (*)    Carboxyhemoglobin 3.1 (*)    Methemoglobin 1.6 (*)    All other components within normal limits  BASIC METABOLIC PANEL - Abnormal; Notable for the following:    Potassium 6.4 (*)    Chloride 97 (*)    Glucose, Bld 152 (*)    BUN 101 (*)    Creatinine, Ser 4.04 (*)    GFR calc non Af Amer 16 (*)    GFR calc Af Amer 18 (*)    Anion gap 16 (*)    All other components within normal limits  URINALYSIS, ROUTINE W REFLEX MICROSCOPIC (NOT AT [facility]) - Abnormal; Notable for the following:    Color, Urine AMBER (*)    APPearance CLOUDY (*)    Hgb urine dipstick TRACE (*)    Bilirubin Urine SMALL (*)    Ketones, ur 15 (*)    Leukocytes, UA MODERATE (*)    All other components within normal limits  RENAL FUNCTION PANEL - Abnormal; Notable for the following:    Chloride 95 (*)    Glucose, Bld 157 (*)    BUN 100 (*)    Creatinine, Ser 3.17 (*)    Albumin 3.2 (*)    GFR calc non Af Amer 21 (*)    GFR calc Af Amer 24 (*)    All other components within normal limits  GLUCOSE, CAPILLARY - Abnormal; Notable for the following:    Glucose-Capillary 166 (*)    All other components within normal limits  BASIC METABOLIC PANEL - Abnormal; Notable for the following:    Sodium 133 (*)    Potassium 5.9 (*)    Chloride 95 (*)    Glucose, Bld 168 (*)    BUN 106 (*)    Creatinine, Ser 3.72 (*)     GFR calc non Af Amer 17 (*)    GFR calc Af Amer 20 (*)    Anion gap 16 (*)    All other components within normal limits  URINE MICROSCOPIC-ADD ON - Abnormal; Notable for the following:    Bacteria, UA FEW (*)    All other components within normal limits  LACTIC ACID, PLASMA - Abnormal; Notable for the following:    Lactic Acid, Venous 2.5 (*)    All other components within normal limits  BRAIN NATRIURETIC PEPTIDE - Abnormal; Notable for the following:    B Natriuretic Peptide 2790.6 (*)    All other components within normal limits  GLUCOSE, CAPILLARY - Abnormal; Notable for the following:    Glucose-Capillary 163 (*)    All other components within normal limits  CBC - Abnormal; Notable for the following:    WBC 11.1 (*)    Hemoglobin 10.3 (*)    HCT 32.9 (*)    MCV 73.1 (*)    MCH 22.9 (*)    RDW 19.7 (*)    Platelets 102 (*)    All other components within normal limits  GLUCOSE, CAPILLARY - Abnormal; Notable for the following:    Glucose-Capillary 153 (*)    All other components within normal limits  GLUCOSE, CAPILLARY - Abnormal; Notable for the following:    Glucose-Capillary 152 (*)    All other components within normal limits  GLUCOSE, CAPILLARY - Abnormal; Notable for the following:    Glucose-Capillary 161 (*)    All other components within normal limits  I-STAT TROPOININ, ED - Abnormal; Notable for the following:    Troponin i, poc 0.12 (*)    All other components within normal limits  I-STAT CHEM 8, ED - Abnormal; Notable for the following:    Sodium 133 (*)    Potassium 7.5 (*)    Chloride 99 (*)    BUN 89 (*)    Creatinine, Ser 4.30 (*)    Calcium, Ion 1.06 (*)    All other components within normal limits  CBG MONITORING, ED - Abnormal; Notable for the following:    Glucose-Capillary 147 (*)    All other components within normal limits  MRSA PCR SCREENING  DIGOXIN LEVEL  SODIUM, URINE, RANDOM  CREATININE, URINE, RANDOM  MAGNESIUM  CARBOXYHEMOGLOBIN     Imaging Review    EKG Interpretation   Date/Time:  Tuesday January 30 2015 13:46:58 EDT Ventricular Rate:  76 PR Interval:  64 QRS Duration: 174 QT Interval:  454 QTC Calculation: 510 R Axis:   156 Text Interpretation:  Accelerated junctional rhythm Right bundle branch  block Lateral infarct, recent Probable anteroseptal infarct, recent ED  PHYSICIAN INTERPRETATION AVAILABLE IN [facility] Confirmed by TEST,  Record ([id number]) on 01/31/2015 8:00:21 AM        MDM   Final diagnoses:  Hyperkalemia  Acute renal failure, unspecified acute renal failure type  Acute on chronic systolic CHF (congestive heart failure)   53 year old male with extensive cardiac history including recent discontinuation of milrinone who presents with generalized  weakness for several days. Patient quiet but awake and oriented on exam. Vital signs stable. EKG on arrival showed widened QRS, regular rhythm, peaked T waves concerning for hyperkalemia. Immediately placed pacer pads on patient. Immediately sent off labs including i-STAT troponin and chemistry. Portable x-rays showed PICC line looped in neck, therefore I placed an EJ for emergent IV access. Gave the patient calcium gluconate, insulin, glucose, and albuterol to treat hyperkalemia. I-STAT showed potassium 7.5 and creatinine of 4. Labs suggest acute on chronic renal failure which is likely the cause of the patient's hyperkalemia. Troponin slightly elevated, likely due to heart strain from volume overload as the patient's BNP is 3000. I suspect that the patient is having an acute exacerbation of his heart failure due to recent medication changes although the cause of his acute renal failure is unclear. Repeat EKG after receiving medications showed a slight improvement in the wide QRS, however I repeated all of these therapies to treat the hyperkalemia. I spoke with the intensivist, who recommended talking to cardiology regarding a CCU admission given pt's  underlying heart disease. The cardiologist on-call recommended admission to pulm critical care due to patient's acute renal failure and possible need for dialysis. The patient was admitted to the ICU for further care.  CRITICAL CARE Performed by: Wenda Overland Cameron Katayama   Total critical care time: 45 minutes  Critical care time was exclusive of separately billable procedures and treating other patients.  Critical care was necessary to treat or prevent imminent or life-threatening deterioration.  Critical care was time spent personally by me on the following activities: development of treatment plan with patient and/or surrogate as well as nursing, discussions with consultants, evaluation of patient's response to treatment, examination of patient, obtaining history from patient or surrogate, ordering and performing treatments and interventions, ordering and review of laboratory studies, ordering and review of radiographic studies, pulse oximetry and re-evaluation of patient's condition.   Sharlett Iles, MD 01/31/15 1200

## 2015-01-31 NOTE — Progress Notes (Signed)
Pt very lethargic, wife states that pt has been lethargic for a couple of days. Informed Dr. Wende Mott no new orders given at this time.

## 2015-02-01 ENCOUNTER — Inpatient Hospital Stay (HOSPITAL_COMMUNITY): Payer: Medicare Other

## 2015-02-01 DIAGNOSIS — G629 Polyneuropathy, unspecified: Secondary | ICD-10-CM

## 2015-02-01 DIAGNOSIS — E1165 Type 2 diabetes mellitus with hyperglycemia: Secondary | ICD-10-CM

## 2015-02-01 DIAGNOSIS — N179 Acute kidney failure, unspecified: Secondary | ICD-10-CM

## 2015-02-01 DIAGNOSIS — F32A Depression, unspecified: Secondary | ICD-10-CM | POA: Diagnosis present

## 2015-02-01 DIAGNOSIS — N183 Chronic kidney disease, stage 3 unspecified: Secondary | ICD-10-CM | POA: Diagnosis present

## 2015-02-01 DIAGNOSIS — D696 Thrombocytopenia, unspecified: Secondary | ICD-10-CM

## 2015-02-01 DIAGNOSIS — I5022 Chronic systolic (congestive) heart failure: Secondary | ICD-10-CM

## 2015-02-01 DIAGNOSIS — F329 Major depressive disorder, single episode, unspecified: Secondary | ICD-10-CM | POA: Diagnosis present

## 2015-02-01 DIAGNOSIS — Z720 Tobacco use: Secondary | ICD-10-CM | POA: Diagnosis present

## 2015-02-01 LAB — RENAL FUNCTION PANEL
ALBUMIN: 2.9 g/dL — AB (ref 3.5–5.0)
ANION GAP: 11 (ref 5–15)
BUN: 70 mg/dL — AB (ref 6–20)
CALCIUM: 9 mg/dL (ref 8.9–10.3)
CO2: 30 mmol/L (ref 22–32)
Chloride: 98 mmol/L — ABNORMAL LOW (ref 101–111)
Creatinine, Ser: 1.83 mg/dL — ABNORMAL HIGH (ref 0.61–1.24)
GFR, EST AFRICAN AMERICAN: 47 mL/min — AB (ref >60)
GFR, EST NON AFRICAN AMERICAN: 40 mL/min — AB (ref >60)
Glucose, Bld: 139 mg/dL — ABNORMAL HIGH (ref 65–99)
PHOSPHORUS: 2 mg/dL — AB (ref 2.5–4.6)
Potassium: 3.8 mmol/L (ref 3.5–5.1)
Sodium: 139 mmol/L (ref 135–145)

## 2015-02-01 LAB — CBC
HEMATOCRIT: 33.3 % — AB (ref 39.0–52.0)
HEMOGLOBIN: 10.9 g/dL — AB (ref 13.0–17.0)
MCH: 23.5 pg — ABNORMAL LOW (ref 26.0–34.0)
MCHC: 32.7 g/dL (ref 30.0–36.0)
MCV: 71.9 fL — ABNORMAL LOW (ref 78.0–100.0)
Platelets: 97 10*3/uL — ABNORMAL LOW (ref 150–400)
RBC: 4.63 MIL/uL (ref 4.22–5.81)
RDW: 19.9 % — ABNORMAL HIGH (ref 11.5–15.5)
WBC: 11.9 10*3/uL — ABNORMAL HIGH (ref 4.0–10.5)

## 2015-02-01 LAB — GLUCOSE, CAPILLARY
GLUCOSE-CAPILLARY: 263 mg/dL — AB (ref 65–99)
Glucose-Capillary: 121 mg/dL — ABNORMAL HIGH (ref 65–99)
Glucose-Capillary: 144 mg/dL — ABNORMAL HIGH (ref 65–99)
Glucose-Capillary: 163 mg/dL — ABNORMAL HIGH (ref 65–99)
Glucose-Capillary: 169 mg/dL — ABNORMAL HIGH (ref 65–99)
Glucose-Capillary: 233 mg/dL — ABNORMAL HIGH (ref 65–99)

## 2015-02-01 LAB — CARBOXYHEMOGLOBIN
Carboxyhemoglobin: 3.5 % — ABNORMAL HIGH (ref 0.5–1.5)
Methemoglobin: 1.1 % (ref 0.0–1.5)
O2 Saturation: 64.1 %
TOTAL HEMOGLOBIN: 10.9 g/dL — AB (ref 13.5–18.0)

## 2015-02-01 LAB — MAGNESIUM: Magnesium: 1.8 mg/dL (ref 1.7–2.4)

## 2015-02-01 MED ORDER — SODIUM PHOSPHATE 3 MMOLE/ML IV SOLN
20.0000 mmol | Freq: Once | INTRAVENOUS | Status: AC
  Administered 2015-02-01: 20 mmol via INTRAVENOUS
  Filled 2015-02-01: qty 6.67

## 2015-02-01 MED ORDER — CHLORHEXIDINE GLUCONATE 4 % EX LIQD
CUTANEOUS | Status: AC
  Filled 2015-02-01: qty 15

## 2015-02-01 MED ORDER — LIDOCAINE HCL 1 % IJ SOLN
INTRAMUSCULAR | Status: AC
  Filled 2015-02-01: qty 20

## 2015-02-01 NOTE — Progress Notes (Signed)
Subjective: Christian Wall is a 54 yo male admitted with acute renal failure. Patient was seen and examined this morning. Patient is somnolent but denies any complaints.   Objective: Filed Vitals:   02/01/15 0700 02/01/15 0800 02/01/15 1000 02/01/15 1015  BP: 128/67 105/84 137/68   Pulse: 88 87 92   Temp:    97.8 F (36.6 C)  TempSrc:    Oral  Resp: 18 17 18    Weight:      SpO2: 96% 96% 96%    General: Vital signs reviewed. Patient is somnolent, well-developed and well-nourished, in no acute distress and cooperative with exam.  Cardiovascular: RRR, S1 normal, S2 normal, no murmurs, gallops, or rubs. Pulmonary/Chest: Diffuse rhonchi, coarse breath sounds.  Abdominal: Soft, non-tender, non-distended, BS + Extremities: No lower extremity edema bilaterally, pulses symmetric and intact bilaterally.   Intake/Output from previous day: 07/27 0701 - 07/28 0700 In: 1200 [P.O.:1200] Out: 3510 [Urine:3510] Intake/Output this shift: Total I/O In: 120 [P.O.:120] Out: 575 [Urine:575]  Lab Results:  Recent Labs  01/31/15 0236 02/01/15 0208  WBC 11.1* 11.9*  HGB 10.3* 10.9*  HCT 32.9* 33.3*  PLT 102* 97*   BMET:   Recent Labs  01/31/15 0236 02/01/15 0208  NA 135 139  K 4.7 3.8  CL 95* 98*  CO2 28 30  GLUCOSE 157* 139*  BUN 100* 70*  CREATININE 3.17* 1.83*  CALCIUM 8.9 9.0   CBG (last 3)   Recent Labs  02/01/15 0022 02/01/15 0409 02/01/15 0841  GLUCAP 163* 121* 144*   Studies/Results: US Renal  01/30/2015   CLINICAL DATA:  Acute renal failure.  EXAM: RENAL / URINARY TRACT ULTRASOUND COMPLETE  COMPARISON:  Abdomen and pelvis CT scan 11/11/2013. Renal ultrasound 11/14/2014.  FINDINGS: Right Kidney:  Length: 12.3 cm. Cortical echogenicity is mildly increased. No stone or hydronephrosis. Perihepatic ascites is noted.  Left Kidney:  Length: 11.6 cm. No hydronephrosis. Cortical echogenicity is mildly increased. Two simple lower pole cysts are identified measuring 4.1 and  3.1 cm in diameter.  Bladder:  There is some debris within urinary bladder.  IMPRESSION: Negative for hydronephrosis.  Mildly increased cortical echogenicity suggestive of medical renal disease.  Small volume of perihepatic ascites.  Debris within the urinary bladder.   Electronically Signed   By: Inge Rise M.D.   On: 01/30/2015 20:31   Dg Chest Port 1 View  01/30/2015   CLINICAL DATA:  Shortness of breath and cough for 2 weeks.  EXAM: PORTABLE CHEST - 1 VIEW  COMPARISON:  11/17/2014 prior radiographs  FINDINGS: Cardiomegaly again noted.  A right PICC line is present coiled over the lower right neck.  Defibrillator pads overlying the left chest identified.  Mild right basilar atelectasis has decreased.  There is no evidence of focal airspace disease, pulmonary edema, suspicious pulmonary nodule/mass, pleural effusion, or pneumothorax. No acute bony abnormalities are identified.  IMPRESSION: Right PICC line coiled overlying the lower right neck.  Cardiomegaly without evidence of acute cardiopulmonary disease.  Decrease mild right basilar atelectasis.   Electronically Signed   By: Margarette Canada M.D.   On: 01/30/2015 12:15    I have reviewed the patient's current medications. Prior to Admission:  Prescriptions prior to admission  Medication Sig Dispense Refill Last Dose  . buPROPion (WELLBUTRIN SR) 150 MG 12 hr tablet Take 1 tablet (150 mg total) by mouth 2 (two) times daily. 60 tablet 3 Past Month at Unknown time  . CVS VITAMIN B12 1000 MCG tablet Take 1,000 mcg  by mouth daily.  5 01/29/2015 at Unknown time  . digoxin (LANOXIN) 0.125 MG tablet Take 1 tablet (0.125 mg total) by mouth daily. 30 tablet 3 01/29/2015 at Unknown time  . DULoxetine (CYMBALTA) 60 MG capsule Take 60 mg by mouth daily.   01/29/2015 at Unknown time  . esomeprazole (NEXIUM) 40 MG capsule Take 40 mg by mouth daily before breakfast.   01/29/2015 at Unknown time  . hydrALAZINE (APRESOLINE) 25 MG tablet Take 3 tablets (75 mg total) by  mouth every 8 (eight) hours. 270 tablet 11 01/29/2015 at Unknown time  . insulin aspart (NOVOLOG) 100 UNIT/ML injection Inject 0-15 Units into the skin 3 (three) times daily with meals. CBG 70 - 120: 0 units CBG 121 - 150: 2 units CBG 151 - 200: 3 units CBG 201 - 250: 5 units CBG 251 - 300: 8 units CBG 301 - 350: 11 units CBG 351 - 400: 15 units 10 mL 11 Past Week at Unknown time  . insulin glargine (LANTUS) 100 UNIT/ML injection Inject 0.3-0.4 mLs (30-40 Units total) into the skin 2 (two) times daily. 40 U am, 30 U pm (Patient taking differently: Inject 30-40 Units into the skin 2 (two) times daily. 30 U am, 50 U pm) 10 mL 11 01/29/2015 at Unknown time  . isosorbide mononitrate (IMDUR) 30 MG 24 hr tablet Take 1 tablet (30 mg total) by mouth 2 (two) times daily. 60 tablet 11 01/29/2015 at Unknown time  . lipase/protease/amylase (CREON-10/PANCREASE) 12000 UNITS CPEP Take 1-3 capsules by mouth 2 (two) times daily. 3 capsules before meal, and 1 capsule before snack   01/29/2015 at Unknown time  . magnesium oxide (MAG-OX) 400 MG tablet Take 1 tablet (400 mg total) by mouth 3 (three) times daily. 90 tablet 3 01/29/2015 at Unknown time  . metFORMIN (GLUCOPHAGE) 850 MG tablet Take 850 mg by mouth 2 (two) times daily.  5 01/29/2015 at Unknown time  . Olopatadine HCl (PATADAY) 0.2 % SOLN Apply 1 drop to eye daily as needed. For allergies   Past Week at Unknown time  . pioglitazone (ACTOS) 15 MG tablet Take 15 mg by mouth daily.  5 01/29/2015 at Unknown time  . pregabalin (LYRICA) 150 MG capsule Take 150 mg by mouth 2 (two) times daily.   01/29/2015 at Unknown time  . simvastatin (ZOCOR) 10 MG tablet Take 10 mg by mouth daily at 6 PM.   01/29/2015 at Unknown time  . spironolactone (ALDACTONE) 25 MG tablet Take 12.5 mg by mouth daily.  11 01/29/2015 at Unknown time  . tiZANidine (ZANAFLEX) 4 MG tablet Take 4 mg by mouth 2 (two) times daily as needed for muscle spasms.   Past Month at Unknown time  . torsemide  (DEMADEX) 20 MG tablet Take 31m daily alternating with 23mdaily. 90 tablet 11 01/29/2015 at Unknown time  . traMADol (ULTRAM) 50 MG tablet Take 50 mg by mouth every 6 (six) hours as needed for moderate pain.    Unk  . acetaminophen (TYLENOL) 500 MG tablet Take 2 tablets (1,000 mg total) by mouth every 6 (six) hours as needed for mild pain or headache. 30 tablet 0 Unk  . Blood Glucose Monitoring Suppl (CVS BLOOD GLUCOSE METER) W/DEVICE KIT Check blood sugars 4 times daily, with meals and at bedtime. 1 kit 0 Taking  . glucose blood (CVS BLOOD GLUCOSE TEST STRIPS) test strip Use as instructed 100 each 12 Taking  . milrinone (PRIMACOR) 20 MG/100ML SOLN infusion Inject 11.7375 mcg/min into  the vein continuous. 250 mL 12 01/26/2015   Scheduled: . antiseptic oral rinse  7 mL Mouth Rinse BID  . chlorhexidine      . digoxin  0.125 mg Oral Daily  . hydrALAZINE  75 mg Oral 3 times per day  . insulin aspart  0-9 Units Subcutaneous 6 times per day  . lidocaine      . simvastatin  10 mg Oral q1800  . sodium phosphate  Dextrose 5% IVPB  20 mmol Intravenous Once   Continuous:   BFM:ZUAUEB chloride  Assessment/Plan:  Acute on Chronic Kidney Disease: Improving, Cr has trended 4.13>3.17>1.83 this morning. Unclear baseline, appears to be 1.5-2.5. FENa 1.8% indicating intrinsic renal disease of unclear etiology. UOP -3510 yesterday. Patient is improving and seems to about at his baseline. Nephrology will sign off.  -Monitor UOP with strict I/Os -Repeat renal function panel tomorrow am  Hyperkalemia: Likely secondary to acute on chronic kidney disease and concomitant use of spironolactone 12.5 mg daily. Resolved, 3.8 this morning.  -Repeat renal function panel tomorrow am -Spironolactone should be removed from medication list as patient was not supposed to be on this per previous notes.   Hypophosphatemia: Phosphorous 2.0 this morning. -Replace with Sodium phos 20 mmol  Chronic Congestive Heart  Failure: Patient recently taken off of milrinone on 01/25/15. Cardiology favors holding off on milrinone at co-ox level was normal.  -Management per PCCM and Advance HF Team  Nephrology will sign off. Thank you for this consult.    LOS: 2 days   Osa Craver, DO PGY-2 Internal Medicine Resident Pager # [phone] 02/01/2015 11:31 AM

## 2015-02-01 NOTE — Procedures (Signed)
RUE PICC exchange 41 cm SVC RA No comp 

## 2015-02-01 NOTE — Progress Notes (Signed)
Transferred patient and all patient belongings, including cell phone to 2C08.

## 2015-02-01 NOTE — Progress Notes (Signed)
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 Sherene Sires, extensive work up in the past to include swallowing evaluation, GI evaluation and cessation of ACE-I on 12/18/14)   Presented to [facility] on 01/30/15 with complaints of weakness, fatigue, N/V and diarrhea.   The patient reports Milrinone gtt was discontinued on 7/21 and since that time he has felt more fatigued. He reported on 7/23, he had one episode of nausea / vomiting and diarrhea. Since that time, he has had progressive fatigue, decreased appetite. He denies fevers, chills, cough, chest pain, pain with inspiration.   ER work up was notable for Na 133, K >7.5, cl 99, sr Cr 4.30 (up from baseline of 1.2-1.4), BNP 3105, troponin 0.12, hgb 11.8, hct 38.3, platelets 104 (down from 325 in 11/2014). CXR assessment showed a PICC line that appeared to be looped back on itself in the neck, cardiomegaly without overt failure and mild R basilar atelectasis. Marland Kitchen   HPI/Subjective: 7/28 A/O 4, states negative CP, negative SOB, negative N/V. States unable to measure daily weights secondary to leg atrophy and therefore unable to obtain reliable standing weight.  Assessment/Plan: Chronic Systolic CHF (NICM), NYHA III  - On home Milrinone, d/c'd 7/21, after which patient became acutely ill and had to be admitted. -Euvolemic; 7/28 CVP= 7-8. -CHF team will determine if patient needs to return to home Milrinone prior to discharge. -Strict in  and out since admission; -4.7 L -Daily weight; admission weight= 87 kg      7/28 bed weight= 83.3 kg -Continue digoxin 0.125 mg daily -Continue hydralazine 75 mg TID  Acute on chronic kidney failure stage III (baseline Cr 1.29-3.22) -Cr now within patient's normal range -Given patient's renal impairment would not restart patient on metformin.  Thrombocytopenia  -In May patient's platelets were WNL, does not appear patient received heparin products. Most likely secondary to acute illness -Ensure no heparin products used   DM Type IIuncontrolled -Over the last year patient's hemoglobin A1c has gone from 15.2--> 7.4 showing significant improvement. - CBGs fairly well controlled continue with sensitive SSI -Lipid panel pending  Peripheral neuropathy -Currently not complaining of symptoms, hold adding any medication -See depression  Depression -CrCl= 54.7 -If patient renal function continues to improve overnight would restart his Cymbalta at a lower dose (30 mg daily)  Tobacco Abuse  -Patient states stopped smoking ~2 months ago;encouraged to continue to not smoke. Smoking cessation      Code Status: FULL Family Communication: family present at time of exam Disposition Plan: Per cardiology    Consultants: Dr.Daniel R Bensimhon (heart failure team)    Procedure/Significant Events: 7/26 US renal >> Negative for hydronephrosis.-Mildly increased cortical echogenicity suggestive of medical renal disease.-Small volume of perihepatic ascites.-Debris within the urinary bladder. 7/28IR FLUORO GUIDE CV LINE RIGHT placement  Culture 7/26 MRSA by PCR negative  Antibiotics: NA  DVT prophylaxis: SCD   Devices    LINES / TUBES:  RUE PICC 7/28 >>     Continuous Infusions:   Objective: VITAL SIGNS: Temp: 98 F (36.7 C) (07/28 1645) Temp Source: Oral (07/28  1645) BP: 141/78 mmHg (07/28 1800) Pulse Rate: 100 (07/28 1800) SPO2; FIO2:   Intake/Output Summary  (Last 24 hours) at 02/01/15 1841 Last data filed at 02/01/15 1700  Gross per 24 hour  Intake   1012 ml  Output   2835 ml  Net  -1823 ml     Exam: General: A/O 4, NAD, No acute respiratory distress Eyes: Negative headache, eye pain, double vision, negative scleral hemorrhage ENT: Negative Runny nose, negative ear pain, negative tinnitus, negative gingival bleeding Neck:  Negative scars, masses, torticollis, lymphadenopathy, JVD Lungs: Clear to auscultation bilaterally without wheezes or crackles Cardiovascular: Regular rate and rhythm without murmur gallop or rub normal S1 and S2, positive S3 Abdomen:negative abdominal pain, negative dysphagia, Nontender, nondistended, soft, bowel sounds positive, no rebound, no ascites, no appreciable mass Extremities: No significant cyanosis, clubbing, or edema bilateral lower extremities Psychiatric:  Negative depression, negative anxiety, negative fatigue, negative mania  Neurologic:  Cranial nerves II through XII intact, tongue/uvula midline, all extremities muscle strength 5/5, sensation intact throughout,  negative dysarthria, negative expressive aphasia, negative receptive aphasia.    Data Reviewed: Basic Metabolic Panel:  Recent Labs Lab 01/30/15 1205 01/30/15 1206 01/30/15 1620 01/30/15 2020 01/31/15 0236 02/01/15 0208  NA 134* 133* 135 133* 135 139  K >7.5* 7.5* 6.4* 5.9* 4.7 3.8  CL 96* 99* 97* 95* 95* 98*  CO2 16*  --  22 22 28 30   GLUCOSE 84 82 152* 168* 157* 139*  BUN 102* 89* 101* 106* 100* 70*  CREATININE 4.13* 4.30* 4.04* 3.72* 3.17* 1.83*  CALCIUM 9.3  --  9.5 9.1 8.9 9.0  MG  --   --   --   --  1.8 1.8  PHOS  --   --   --   --  3.0 2.0*   Liver Function Tests:  Recent Labs Lab 01/31/15 0236 02/01/15 0208  ALBUMIN 3.2* 2.9*   No results for input(s): LIPASE, AMYLASE in the last 168 hours. No results for input(s): AMMONIA in the last 168 hours. CBC:  Recent Labs Lab 01/30/15 1205 01/30/15 1206  01/31/15 0236 02/01/15 0208  WBC 13.5*  --  11.1* 11.9*  NEUTROABS 10.5*  --   --   --   HGB 11.8* 14.3 10.3* 10.9*  HCT 38.3* 42.0 32.9* 33.3*  MCV 74.7*  --  73.1* 71.9*  PLT 104*  --  102* 97*   Cardiac Enzymes: No results for input(s): CKTOTAL, CKMB, CKMBINDEX, TROPONINI in the last 168 hours. BNP (last 3 results)  Recent Labs  12/26/14 1130 01/30/15 1205 01/30/15 2020  BNP 1110.4* 3105.0* 2790.6*    ProBNP (last 3 results) No results for input(s): PROBNP in the last 8760 hours.  CBG:  Recent Labs Lab 02/01/15 0022 02/01/15 0409 02/01/15 0841 02/01/15 1220 02/01/15 1558  GLUCAP 163* 121* 144* 169* 263*    Recent Results (from the past 240 hour(s))  MRSA PCR Screening     Status: None   Collection Time: 01/30/15  6:06 PM  Result Value Ref Range Status   MRSA by PCR NEGATIVE NEGATIVE Final    Comment:        The GeneXpert MRSA Assay (FDA approved for NASAL specimens only), is one component of a comprehensive MRSA colonization surveillance program. It is not intended to diagnose MRSA infection nor to guide or monitor treatment for MRSA infections.      Studies:  Recent x-ray studies have been reviewed in detail by the Attending Physician  Scheduled Meds:  Scheduled Meds: . antiseptic oral rinse  7 mL Mouth Rinse BID  . chlorhexidine      . digoxin  0.125 mg Oral Daily  . hydrALAZINE  75 mg Oral 3 times per day  . insulin aspart  0-9 Units Subcutaneous 6 times per day  . lidocaine      . simvastatin  10 mg Oral q1800    Time spent on care of this patient: 40 mins   WOODS, Roselind Messier , MD  [facility] Office  [phone] Pager [phone]  On-Call/Text Page:      [email or web address]      password [email or web address]  If 7PM-7AM, please contact night-coverage [email or web address] Password [facility] 02/01/2015, 6:41 PM   LOS: 2 days   Care during the described time interval was provided by me .  I have reviewed this patient's available data, including  medical history, events of note, physical examination, and all test results as part of my evaluation. I have personally reviewed and interpreted all radiology studies.   Carolyne Littles, MD [phone] Pager

## 2015-02-01 NOTE — Progress Notes (Signed)
Advanced Heart Failure Rounding Note   Subjective:    Much more alert. Renal function normalizing. Wants to go home. CVP 8. Co-ox 64%. Denies dyspnea   Objective:   Weight Range:  Vital Signs:   Temp:  [97 F (36.1 C)-98.8 F (37.1 C)] 98.8 F (37.1 C) (07/28 2300) Pulse Rate:  [87-100] 98 (07/28 2300) Resp:  [17-23] 20 (07/28 2300) BP: (105-141)/(64-103) 126/72 mmHg (07/28 2300) SpO2:  [90 %-98 %] 96 % (07/28 2300) Weight:  [83.3 kg (183 lb 10.3 oz)-97.6 kg (215 lb 2.7 oz)] 97.6 kg (215 lb 2.7 oz) (07/28 2300) Last BM Date: 01/30/15  Weight change: Filed Weights   01/31/15 0341 02/01/15 0500 02/01/15 2300  Weight: 85.2 kg (187 lb 13.3 oz) 83.3 kg (183 lb 10.3 oz) 97.6 kg (215 lb 2.7 oz)    Intake/Output:   Intake/Output Summary (Last 24 hours) at 02/01/15 2345 Last data filed at 02/01/15 2000  Gross per 24 hour  Intake    772 ml  Output   2660 ml  Net  -1888 ml     Physical Exam: General: Alert and conversant  No resp difficulty HEENT: normal Neck: supple. JVP 7-8 . Carotids 2+ bilat; no bruits. No lymphadenopathy or thryomegaly appreciated. Cor: PMI laterally displaced. Regular rate & rhythm. +s3 Lungs: clear Abdomen: soft, nontender, nondistended. No hepatosplenomegaly. No bruits or masses. Good bowel sounds. Extremities: no cyanosis, clubbing, rash, edema Neuro: alert and oriented cranial nerves grossly intact. moves all 4 extremities w/o difficulty.   Telemetry: SR  Labs: Basic Metabolic Panel:  Recent Labs Lab 01/30/15 1205 01/30/15 1206 01/30/15 1620 01/30/15 2020 01/31/15 0236 02/01/15 0208  NA 134* 133* 135 133* 135 139  K >7.5* 7.5* 6.4* 5.9* 4.7 3.8  CL 96* 99* 97* 95* 95* 98*  CO2 16*  --  22 22 28 30   GLUCOSE 84 82 152* 168* 157* 139*  BUN 102* 89* 101* 106* 100* 70*  CREATININE 4.13* 4.30* 4.04* 3.72* 3.17* 1.83*  CALCIUM 9.3  --  9.5 9.1 8.9 9.0  MG  --   --   --   --  1.8 1.8  PHOS  --   --   --   --  3.0 2.0*    Liver Function  Tests:  Recent Labs Lab 01/31/15 0236 02/01/15 0208  ALBUMIN 3.2* 2.9*   No results for input(s): LIPASE, AMYLASE in the last 168 hours. No results for input(s): AMMONIA in the last 168 hours.  CBC:  Recent Labs Lab 01/30/15 1205 01/30/15 1206 01/31/15 0236 02/01/15 0208  WBC 13.5*  --  11.1* 11.9*  NEUTROABS 10.5*  --   --   --   HGB 11.8* 14.3 10.3* 10.9*  HCT 38.3* 42.0 32.9* 33.3*  MCV 74.7*  --  73.1* 71.9*  PLT 104*  --  102* 97*    Cardiac Enzymes: No results for input(s): CKTOTAL, CKMB, CKMBINDEX, TROPONINI in the last 168 hours.  BNP: BNP (last 3 results)  Recent Labs  12/26/14 1130 01/30/15 1205 01/30/15 2020  BNP 1110.4* 3105.0* 2790.6*    ProBNP (last 3 results) No results for input(s): PROBNP in the last 8760 hours.    Other results:  Imaging: Ir Fluoro Guide Cv Line Right  02/01/2015   CLINICAL DATA:  Malpositioned existing right PICC  EXAM: IR RIGHT FLOURO GUIDE CV LINE  FLUOROSCOPY TIME:  12 seconds  MEDICATIONS AND MEDICAL HISTORY: None  ANESTHESIA/SEDATION: None  CONTRAST:  None  PROCEDURE: The procedure, risks, benefits,  and alternatives were explained to the patient. Questions regarding the procedure were encouraged and answered. The patient understands and consents to the procedure.  The right arm was prepped with Betadine in a sterile fashion, and a sterile drape was applied covering the operative field. A sterile gown and sterile gloves were used for the procedure.  The existing PICC was cut and exchanged over a 018 wire for a new 41 cm double lumen PICC. This was advanced to the cavoatrial junction. It was flushed and sewn in place.  FINDINGS: The new PICC tip is at the cavoatrial junction.  COMPLICATIONS: None  IMPRESSION: Successful right upper extremity PICC exchange. The tip of the PICC is now at the cavoatrial junction.   Electronically Signed   By: Jolaine Click M.D.   On: 02/01/2015 15:47     Medications:     Scheduled  Medications: . antiseptic oral rinse  7 mL Mouth Rinse BID  . digoxin  0.125 mg Oral Daily  . hydrALAZINE  75 mg Oral 3 times per day  . insulin aspart  0-9 Units Subcutaneous 6 times per day  . simvastatin  10 mg Oral q1800    Infusions:    PRN Medications: sodium chloride   Assessment:   1) A/c systolic HF due to NICM EF 20-25% 2) Cardiogenic shock 3) Acute kidney injury 4) Hyperkalemia   Plan/Discussion:    Much improved,.Mole alert. Renal function improved. CVP and co-ox look good. Suspect he had an episode of hypotension at home leading to ATN. Now improved. Currently no need to resume milrinone but will continue to follow closely for 24-48 hours to continue to assess and prevent recurrent episode. Consult PT. Can got to SDU.    Length of Stay: 2   Arvilla Meres MD 02/01/2015, 11:45 PM  Advanced Heart Failure Team Pager [phone] (M-F; 7a - 4p)  Please contact [facility] for night-coverage after hours (4p -7a ) and weekends on [email or web address]

## 2015-02-01 NOTE — Progress Notes (Signed)
Advanced Heart Failure Rounding Note   Subjective:    Remains lethargic. But will have some conversation. Denies dyspnea. Renal function improving with hydration.   PICC coiled up in RIJ. Pending IR to fix.     Objective:   Weight Range:  Vital Signs:   Temp:  [97 F (36.1 C)-98.8 F (37.1 C)] 98.8 F (37.1 C) (07/28 2300) Pulse Rate:  [87-100] 98 (07/28 2300) Resp:  [17-23] 20 (07/28 2300) BP: (105-141)/(64-103) 126/72 mmHg (07/28 2300) SpO2:  [90 %-98 %] 96 % (07/28 2300) Weight:  [83.3 kg (183 lb 10.3 oz)-97.6 kg (215 lb 2.7 oz)] 97.6 kg (215 lb 2.7 oz) (07/28 2300) Last BM Date: 01/30/15  Weight change: Filed Weights   01/31/15 0341 02/01/15 0500 02/01/15 2300  Weight: 85.2 kg (187 lb 13.3 oz) 83.3 kg (183 lb 10.3 oz) 97.6 kg (215 lb 2.7 oz)    Intake/Output:   Intake/Output Summary (Last 24 hours) at 02/01/15 2341 Last data filed at 02/01/15 2000  Gross per 24 hour  Intake    772 ml  Output   2660 ml  Net  -1888 ml     Physical Exam: General: Lethargic  No resp difficulty HEENT: normal Neck: supple. JVP 7-8 . Carotids 2+ bilat; no bruits. No lymphadenopathy or thryomegaly appreciated. Cor: PMI laterally displaced. Regular rate & rhythm. +s3 Lungs: clear Abdomen: soft, nontender, nondistended. No hepatosplenomegaly. No bruits or masses. Good bowel sounds. Extremities: no cyanosis, clubbing, rash, edema Neuro: arousable but letharigic cranial nerves grossly intact. moves all 4 extremities w/o difficulty.   Telemetry: SR  Labs: Basic Metabolic Panel:  Recent Labs Lab 01/30/15 1205 01/30/15 1206 01/30/15 1620 01/30/15 2020 01/31/15 0236 02/01/15 0208  NA 134* 133* 135 133* 135 139  K >7.5* 7.5* 6.4* 5.9* 4.7 3.8  CL 96* 99* 97* 95* 95* 98*  CO2 16*  --  22 22 28 30   GLUCOSE 84 82 152* 168* 157* 139*  BUN 102* 89* 101* 106* 100* 70*  CREATININE 4.13* 4.30* 4.04* 3.72* 3.17* 1.83*  CALCIUM 9.3  --  9.5 9.1 8.9 9.0  MG  --   --   --   --  1.8 1.8   PHOS  --   --   --   --  3.0 2.0*    Liver Function Tests:  Recent Labs Lab 01/31/15 0236 02/01/15 0208  ALBUMIN 3.2* 2.9*   No results for input(s): LIPASE, AMYLASE in the last 168 hours. No results for input(s): AMMONIA in the last 168 hours.  CBC:  Recent Labs Lab 01/30/15 1205 01/30/15 1206 01/31/15 0236 02/01/15 0208  WBC 13.5*  --  11.1* 11.9*  NEUTROABS 10.5*  --   --   --   HGB 11.8* 14.3 10.3* 10.9*  HCT 38.3* 42.0 32.9* 33.3*  MCV 74.7*  --  73.1* 71.9*  PLT 104*  --  102* 97*    Cardiac Enzymes: No results for input(s): CKTOTAL, CKMB, CKMBINDEX, TROPONINI in the last 168 hours.  BNP: BNP (last 3 results)  Recent Labs  12/26/14 1130 01/30/15 1205 01/30/15 2020  BNP 1110.4* 3105.0* 2790.6*    ProBNP (last 3 results) No results for input(s): PROBNP in the last 8760 hours.    Other results:  Imaging: Ir Fluoro Guide Cv Line Right  02/01/2015   CLINICAL DATA:  Malpositioned existing right PICC  EXAM: IR RIGHT FLOURO GUIDE CV LINE  FLUOROSCOPY TIME:  12 seconds  MEDICATIONS AND MEDICAL HISTORY: None  ANESTHESIA/SEDATION: None  CONTRAST:  None  PROCEDURE: The procedure, risks, benefits, and alternatives were explained to the patient. Questions regarding the procedure were encouraged and answered. The patient understands and consents to the procedure.  The right arm was prepped with Betadine in a sterile fashion, and a sterile drape was applied covering the operative field. A sterile gown and sterile gloves were used for the procedure.  The existing PICC was cut and exchanged over a 018 wire for a new 41 cm double lumen PICC. This was advanced to the cavoatrial junction. It was flushed and sewn in place.  FINDINGS: The new PICC tip is at the cavoatrial junction.  COMPLICATIONS: None  IMPRESSION: Successful right upper extremity PICC exchange. The tip of the PICC is now at the cavoatrial junction.   Electronically Signed   By: Jolaine Click M.D.   On:  02/01/2015 15:47      Medications:     Scheduled Medications: . antiseptic oral rinse  7 mL Mouth Rinse BID  . digoxin  0.125 mg Oral Daily  . hydrALAZINE  75 mg Oral 3 times per day  . insulin aspart  0-9 Units Subcutaneous 6 times per day  . simvastatin  10 mg Oral q1800     Infusions:     PRN Medications:  sodium chloride   Assessment:   1) A/c systolic HF due to NICM 2) Cardiogenic shock 3) Acute kidney injury 4) Hyperkalemia   Plan/Discussion:    He is improving with hydration but remains lethargic. PICC line coiled in IJ so unable to get CVPs and co-ox yet. Initial co-ox suggestive of adequate cardiac output however. Continue supportive care. Would not restart milrinone just yet.    Length of Stay: 2   Arvilla Meres MD 02/01/2015, 11:41 PM  Advanced Heart Failure Team Pager [phone] (M-F; 7a - 4p)  Please contact [facility] for night-coverage after hours (4p -7a ) and weekends on [email or web address]

## 2015-02-02 LAB — POCT I-STAT 3, VENOUS BLOOD GAS (G3P V)
ACID-BASE EXCESS: 9 mmol/L — AB (ref 0.0–2.0)
BICARBONATE: 34.3 meq/L — AB (ref 20.0–24.0)
O2 Saturation: 70 %
PCO2 VEN: 54.9 mmHg — AB (ref 45.0–50.0)
TCO2: 36 mmol/L (ref 0–100)
pH, Ven: 7.403 — ABNORMAL HIGH (ref 7.250–7.300)
pO2, Ven: 38 mmHg (ref 30.0–45.0)

## 2015-02-02 LAB — CARBOXYHEMOGLOBIN
Carboxyhemoglobin: 3.8 % — ABNORMAL HIGH (ref 0.5–1.5)
METHEMOGLOBIN: 0.9 % (ref 0.0–1.5)
O2 Saturation: 66.3 %
TOTAL HEMOGLOBIN: 11.1 g/dL — AB (ref 13.5–18.0)

## 2015-02-02 LAB — URINALYSIS, ROUTINE W REFLEX MICROSCOPIC
Glucose, UA: 100 mg/dL — AB
Hgb urine dipstick: NEGATIVE
Ketones, ur: 15 mg/dL — AB
NITRITE: NEGATIVE
PROTEIN: 100 mg/dL — AB
Specific Gravity, Urine: 1.018 (ref 1.005–1.030)
Urobilinogen, UA: >8 mg/dL — ABNORMAL HIGH (ref 0.0–1.0)
pH: 5.5 (ref 5.0–8.0)

## 2015-02-02 LAB — GLUCOSE, CAPILLARY
GLUCOSE-CAPILLARY: 193 mg/dL — AB (ref 65–99)
GLUCOSE-CAPILLARY: 266 mg/dL — AB (ref 65–99)
Glucose-Capillary: 211 mg/dL — ABNORMAL HIGH (ref 65–99)
Glucose-Capillary: 285 mg/dL — ABNORMAL HIGH (ref 65–99)
Glucose-Capillary: 269 mg/dL — ABNORMAL HIGH (ref 65–99)

## 2015-02-02 LAB — CBC
HCT: 35.1 % — ABNORMAL LOW (ref 39.0–52.0)
Hemoglobin: 11 g/dL — ABNORMAL LOW (ref 13.0–17.0)
MCH: 23.3 pg — AB (ref 26.0–34.0)
MCHC: 31.3 g/dL (ref 30.0–36.0)
MCV: 74.2 fL — ABNORMAL LOW (ref 78.0–100.0)
Platelets: 87 10*3/uL — ABNORMAL LOW (ref 150–400)
RBC: 4.73 MIL/uL (ref 4.22–5.81)
RDW: 21.4 % — AB (ref 11.5–15.5)
WBC: 10.4 10*3/uL (ref 4.0–10.5)

## 2015-02-02 LAB — RENAL FUNCTION PANEL
ALBUMIN: 2.9 g/dL — AB (ref 3.5–5.0)
ANION GAP: 9 (ref 5–15)
BUN: 43 mg/dL — AB (ref 6–20)
CO2: 30 mmol/L (ref 22–32)
Calcium: 9 mg/dL (ref 8.9–10.3)
Chloride: 98 mmol/L — ABNORMAL LOW (ref 101–111)
Creatinine, Ser: 1.26 mg/dL — ABNORMAL HIGH (ref 0.61–1.24)
GFR calc non Af Amer: >60 mL/min (ref >60)
Glucose, Bld: 219 mg/dL — ABNORMAL HIGH (ref 65–99)
Phosphorus: 3 mg/dL (ref 2.5–4.6)
Potassium: 3.7 mmol/L (ref 3.5–5.1)
SODIUM: 137 mmol/L (ref 135–145)

## 2015-02-02 LAB — LIPID PANEL
Cholesterol: 185 mg/dL (ref 0–200)
HDL: 10 mg/dL — ABNORMAL LOW (ref >40)
LDL Cholesterol: 132 mg/dL — ABNORMAL HIGH (ref 0–99)
Total CHOL/HDL Ratio: 18.5 RATIO
Triglycerides: 213 mg/dL — ABNORMAL HIGH (ref <150)
VLDL: 43 mg/dL — ABNORMAL HIGH (ref 0–40)

## 2015-02-02 LAB — URINE MICROSCOPIC-ADD ON

## 2015-02-02 MED ORDER — ISOSORBIDE MONONITRATE ER 30 MG PO TB24
30.0000 mg | ORAL_TABLET | Freq: Every day | ORAL | Status: DC
  Administered 2015-02-02 – 2015-02-04 (×3): 30 mg via ORAL
  Filled 2015-02-02 (×3): qty 1

## 2015-02-02 MED ORDER — TRAMADOL HCL 50 MG PO TABS
50.0000 mg | ORAL_TABLET | Freq: Four times a day (QID) | ORAL | Status: DC | PRN
  Administered 2015-02-02: 50 mg via ORAL
  Filled 2015-02-02: qty 1

## 2015-02-02 MED ORDER — MAGNESIUM SULFATE 2 GM/50ML IV SOLN
2.0000 g | Freq: Once | INTRAVENOUS | Status: AC
Start: 2015-02-02 — End: 2015-02-02
  Administered 2015-02-02: 2 g via INTRAVENOUS
  Filled 2015-02-02: qty 50

## 2015-02-02 NOTE — Progress Notes (Signed)
Advanced Heart Failure Rounding Note   Subjective:    Renal function continues to improve.   Awake and alert. Wants to go home. Denies SOB.   Co-ox 66%. CVP 4   Objective:   Weight Range:  Vital Signs:   Temp:  [97 F (36.1 C)-98.8 F (37.1 C)] 98.6 F (37 C) (07/29 0400) Pulse Rate:  [91-100] 94 (07/29 0853) Resp:  [17-23] 22 (07/29 0756) BP: (121-141)/(54-103) 137/76 mmHg (07/29 0756) SpO2:  [90 %-99 %] 95 % (07/29 0756) Weight:  [210 lb 15.7 oz (95.7 kg)-215 lb 2.7 oz (97.6 kg)] 210 lb 15.7 oz (95.7 kg) (07/29 0500) Last BM Date: 01/30/15  Weight change: Filed Weights   02/01/15 0500 02/01/15 2300 02/02/15 0500  Weight: 183 lb 10.3 oz (83.3 kg) 215 lb 2.7 oz (97.6 kg) 210 lb 15.7 oz (95.7 kg)    Intake/Output:   Intake/Output Summary (Last 24 hours) at 02/02/15 1121 Last data filed at 02/02/15 1015  Gross per 24 hour  Intake    609 ml  Output   1810 ml  Net  -1201 ml     Physical Exam: General: Alert and conversant  No resp difficulty HEENT: normal Neck: supple. JVP flat . Carotids 2+ bilat; no bruits. No lymphadenopathy or thryomegaly appreciated. Cor: PMI laterally displaced. Regular rate & rhythm. +s3 Lungs: clear Abdomen: soft, nontender, nondistended. No hepatosplenomegaly. No bruits or masses. Good bowel sounds. Extremities: no cyanosis, clubbing, rash, edema RUE PICC  Neuro: alert and oriented cranial nerves grossly intact. moves all 4 extremities w/o difficulty.  GU: Condom Cath   Telemetry: SR  Labs: Basic Metabolic Panel:  Recent Labs Lab 01/30/15 1620 01/30/15 2020 01/31/15 0236 02/01/15 0208 02/02/15 0515  NA 135 133* 135 139 137  K 6.4* 5.9* 4.7 3.8 3.7  CL 97* 95* 95* 98* 98*  CO2 22 22 28 30 30   GLUCOSE 152* 168* 157* 139* 219*  BUN 101* 106* 100* 70* 43*  CREATININE 4.04* 3.72* 3.17* 1.83* 1.26*  CALCIUM 9.5 9.1 8.9 9.0 9.0  MG  --   --  1.8 1.8  --   PHOS  --   --  3.0 2.0* 3.0    Liver Function Tests:  Recent  Labs Lab 01/31/15 0236 02/01/15 0208 02/02/15 0515  ALBUMIN 3.2* 2.9* 2.9*   No results for input(s): LIPASE, AMYLASE in the last 168 hours. No results for input(s): AMMONIA in the last 168 hours.  CBC:  Recent Labs Lab 01/30/15 1205 01/30/15 1206 01/31/15 0236 02/01/15 0208 02/02/15 0500  WBC 13.5*  --  11.1* 11.9* 10.4  NEUTROABS 10.5*  --   --   --   --   HGB 11.8* 14.3 10.3* 10.9* 11.0*  HCT 38.3* 42.0 32.9* 33.3* 35.1*  MCV 74.7*  --  73.1* 71.9* 74.2*  PLT 104*  --  102* 97* 87*    Cardiac Enzymes: No results for input(s): CKTOTAL, CKMB, CKMBINDEX, TROPONINI in the last 168 hours.  BNP: BNP (last 3 results)  Recent Labs  12/26/14 1130 01/30/15 1205 01/30/15 2020  BNP 1110.4* 3105.0* 2790.6*    ProBNP (last 3 results) No results for input(s): PROBNP in the last 8760 hours.    Other results:  Imaging: Ir Fluoro Guide Cv Line Right  02/01/2015   CLINICAL DATA:  Malpositioned existing right PICC  EXAM: IR RIGHT FLOURO GUIDE CV LINE  FLUOROSCOPY TIME:  12 seconds  MEDICATIONS AND MEDICAL HISTORY: None  ANESTHESIA/SEDATION: None  CONTRAST:  None  PROCEDURE:  The procedure, risks, benefits, and alternatives were explained to the patient. Questions regarding the procedure were encouraged and answered. The patient understands and consents to the procedure.  The right arm was prepped with Betadine in a sterile fashion, and a sterile drape was applied covering the operative field. A sterile gown and sterile gloves were used for the procedure.  The existing PICC was cut and exchanged over a 018 wire for a new 41 cm double lumen PICC. This was advanced to the cavoatrial junction. It was flushed and sewn in place.  FINDINGS: The new PICC tip is at the cavoatrial junction.  COMPLICATIONS: None  IMPRESSION: Successful right upper extremity PICC exchange. The tip of the PICC is now at the cavoatrial junction.   Electronically Signed   By: Jolaine Click M.D.   On: 02/01/2015 15:47      Medications:     Scheduled Medications: . antiseptic oral rinse  7 mL Mouth Rinse BID  . digoxin  0.125 mg Oral Daily  . hydrALAZINE  75 mg Oral 3 times per day  . insulin aspart  0-9 Units Subcutaneous 6 times per day  . simvastatin  10 mg Oral q1800    Infusions:    PRN Medications: sodium chloride   Assessment:   1) A/c systolic HF due to NICM EF 20-25% 2) Cardiogenic shock 3) Acute kidney injury 4) Hyperkalemia   Plan/Discussion:    Renal function improved. CO-OX stable. CVP 4. Hold diuretics today. Consider restarting Restart torsemide 20 mg tomorrow. Continue hydralazine 75 tid and add imdur 30 daily. No bb for now.   Hold off on spiro. Dig level 1.8 on admit. Stop dig for now.    Length of Stay: 3   CLEGG,AMY NP-C  02/02/2015, 11:21 AM  Advanced Heart Failure Team Pager [phone] (M-F; 7a - 4p)  Please contact [facility] for night-coverage after hours (4p -7a ) and weekends on [email or web address]   Patient seen and examined with [name], NP. We discussed all aspects of the encounter. I agree with the assessment and plan as stated above.   Much improved,.Mole alert. Renal function improved. CVP and co-ox look good. Suspect he had an episode of hypotension at home leading to ATN. Now improved. Currently no need to resume milrinone but need to watch closely for recurrent episode. Agree with med recs as above. Would use torsemide 20 daily, hydral/imdur and spiro at discharge.   Bensimhon, Daniel,MD 5:35 PM

## 2015-02-02 NOTE — Progress Notes (Signed)
[facility]  Patient Status: Active (receiving services up to time of hospitalization)  [facility] is providing the following services: RN, PT and MSW  If patient discharges after hours, please call [phone].   Christian Wall 02/02/2015, 9:43 AM

## 2015-02-02 NOTE — Care Management Important Message (Signed)
Important Message  Patient Details  Name: Christian Wall MRN: [id number] Date of Birth: 08/21/1960   Medicare Important Message Given:  Yes-second notification given    Yvonna Alanis 02/02/2015, 12:24 PM

## 2015-02-02 NOTE — Progress Notes (Signed)
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 Sherene Sires, extensive work up in the past to include swallowing evaluation, GI evaluation and cessation of ACE-I on 12/18/14)   Presented to [facility] on 01/30/15 with complaints of weakness, fatigue, N/V and diarrhea.   The patient reports Milrinone gtt was discontinued on 7/21 and since that time he has felt more fatigued. He reported on 7/23, he had one episode of nausea / vomiting and diarrhea. Since that time, he has had progressive fatigue, decreased appetite. He denies fevers, chills, cough, chest pain, pain with inspiration.   ER work up was notable for Na 133, K >7.5, cl 99, sr Cr 4.30 (up from baseline of 1.2-1.4), BNP 3105, troponin 0.12, hgb 11.8, hct 38.3, platelets 104 (down from 325 in 11/2014). CXR assessment showed a PICC line that appeared to be looped back on itself in the neck, cardiomegaly without overt failure and mild R basilar atelectasis. Marland Kitchen   HPI/Subjective: 7/29 A/O 4, states negative CP, negative SOB, negative N/V. Unable to measure daily weights secondary to leg atrophy and therefore unable to obtain reliable standing weight.  Assessment/Plan: Chronic Systolic CHF (NICM), NYHA III  - On home Milrinone, d/c'd 7/21, after which patient became acutely ill and had to be admitted. -7/29 CVP= 10 -CHF team will determine if patient needs to return to home Milrinone prior to discharge. -Strict in and out since  admission; -4.9 L -Daily weight; admission weight= 87 kg      7/29 bed weight= 95.7 kg (new bed) -Continue digoxin 0.125 mg daily -Continue hydralazine 75 mg TID  Acute on chronic kidney failure stage III (baseline Cr 1.29-3.22) -Cr now within patient's normal range -Given patient's renal impairment would not restart patient on metformin.  Thrombocytopenia  -In May patient's platelets were WNL, does not appear patient received heparin products. Most likely secondary to acute illness -Ensure no heparin products used  -HIT panel pending  DM Type IIuncontrolled -Over the last year patient's hemoglobin A1c has gone from 15.2--> 7.4 showing significant improvement. - CBGs fairly well controlled continue with sensitive SSI -Lipid panel pending  Peripheral neuropathy -Currently not complaining of symptoms, hold adding any medication -See depression  Depression -CrCl= 54.7 -If patient renal function continues to improve overnight would restart his Cymbalta at a lower dose (30 mg daily)  Tobacco Abuse  -Patient states stopped smoking ~2 months ago;encouraged to continue to not smoke. Smoking cessation      Code Status: FULL Family Communication: family present at time of exam Disposition Plan: Per cardiology    Consultants: Dr.Daniel R Bensimhon (heart failure team)    Procedure/Significant Events: 5/9 echocardiogram;- Left ventricle: The cavity size was mildly dilated. LVEF= 20% to 25%. Diffuse hypokinesis. Akinesis of basal-mid inferoseptal and apical septal myocardium. Akinesis of the entireinferior myocardium.-Right atrium: moderately dilated.-Tricuspid valve: moderate regurgitation. 7/26 US renal >> Negative for hydronephrosis.-Mildly increased cortical echogenicity suggestive of medical renal disease.-Small volume of perihepatic ascites.-Debris within the urinary bladder. 7/28IR FLUORO GUIDE CV LINE RIGHT placement  Culture 7/26 MRSA by PCR  negative  Antibiotics: NA  DVT prophylaxis: SCD   Devices    LINES / TUBES:  RUE PICC 7/28 >>     Continuous Infusions:   Objective: VITAL SIGNS: Temp: 98.6 F (37 C) (07/29 0400) Temp Source: Axillary (07/29 0400) BP: 135/71 mmHg (07/29 0900) Pulse Rate: 94 (07/29 0853) SPO2; FIO2:   Intake/Output Summary (Last 24 hours) at 02/02/15 1406 Last data filed at 02/02/15 1100  Gross per 24 hour  Intake    290 ml  Output   1350 ml  Net  -1060 ml     Exam: General: A/O 4, NAD, No acute respiratory distress Eyes: Negative headache, eye pain, double vision, negative scleral hemorrhage ENT: Negative Runny nose, negative ear pain, negative tinnitus, negative gingival bleeding Neck:  Negative scars, masses, torticollis, lymphadenopathy, JVD Lungs: Clear to auscultation bilaterally without wheezes or crackles Cardiovascular: Regular rate and rhythm without murmur gallop or rub normal S1 and S2, positive S3 Abdomen:negative abdominal pain, negative dysphagia, Nontender, nondistended, soft, bowel sounds positive, no rebound, no ascites, no appreciable mass Extremities: No significant cyanosis, clubbing, or edema bilateral lower extremities Psychiatric:  Negative depression, negative anxiety, negative fatigue, negative mania  Neurologic:  Cranial nerves II through XII intact, tongue/uvula midline, all extremities muscle strength 5/5, sensation intact throughout,  negative dysarthria, negative expressive aphasia, negative receptive aphasia.    Data Reviewed: Basic Metabolic Panel:  Recent Labs Lab 01/30/15 1620 01/30/15 2020 01/31/15 0236 02/01/15 0208 02/02/15 0515  NA 135 133* 135 139 137  K 6.4* 5.9* 4.7 3.8 3.7  CL 97* 95* 95* 98* 98*  CO2 22 22 28 30 30   GLUCOSE 152* 168* 157* 139* 219*  BUN 101* 106* 100* 70* 43*  CREATININE 4.04* 3.72* 3.17* 1.83* 1.26*  CALCIUM 9.5 9.1 8.9 9.0 9.0  MG  --   --  1.8 1.8  --   PHOS  --   --  3.0 2.0* 3.0   Liver Function  Tests:  Recent Labs Lab 01/31/15 0236 02/01/15 0208 02/02/15 0515  ALBUMIN 3.2* 2.9* 2.9*   No results for input(s): LIPASE, AMYLASE in the last 168 hours. No results for input(s): AMMONIA in the last 168 hours. CBC:  Recent Labs Lab 01/30/15 1205 01/30/15 1206 01/31/15 0236 02/01/15 0208 02/02/15 0500  WBC 13.5*  --  11.1* 11.9* 10.4  NEUTROABS 10.5*  --   --   --   --   HGB 11.8* 14.3 10.3* 10.9* 11.0*  HCT 38.3* 42.0 32.9* 33.3* 35.1*  MCV 74.7*  --  73.1* 71.9* 74.2*  PLT 104*  --  102* 97* 87*   Cardiac Enzymes: No results for input(s): CKTOTAL, CKMB, CKMBINDEX, TROPONINI in the last 168 hours. BNP (last 3 results)  Recent Labs  12/26/14 1130 01/30/15 1205 01/30/15 2020  BNP 1110.4* 3105.0* 2790.6*    ProBNP (last 3 results) No results for input(s): PROBNP in the last 8760 hours.  CBG:  Recent Labs Lab 02/01/15 1220 02/01/15 1558 02/01/15 2040 02/02/15 0527 02/02/15 0838  GLUCAP 169* 263* 233* 193* 211*    Recent Results (from the past 240 hour(s))  MRSA PCR Screening     Status: None   Collection Time: 01/30/15  6:06 PM  Result Value Ref Range Status   MRSA by PCR NEGATIVE NEGATIVE Final    Comment:        The GeneXpert MRSA Assay (FDA approved for NASAL specimens only), is one component of a comprehensive MRSA colonization surveillance program. It is not intended to diagnose MRSA infection nor  to guide or monitor treatment for MRSA infections.      Studies:  Recent x-ray studies have been reviewed in detail by the Attending Physician  Scheduled Meds:  Scheduled Meds: . antiseptic oral rinse  7 mL Mouth Rinse BID  . hydrALAZINE  75 mg Oral 3 times per day  . insulin aspart  0-9 Units Subcutaneous 6 times per day  . isosorbide mononitrate  30 mg Oral Daily  . magnesium sulfate 1 - 4 g bolus IVPB  2 g Intravenous Once  . simvastatin  10 mg Oral q1800    Time spent on care of this patient: 40 mins   WOODS, Roselind Messier ,  MD  [facility] Office  [phone] Pager [phone]  On-Call/Text Page:      [email or web address]      password [email or web address]  If 7PM-7AM, please contact night-coverage [email or web address] Password [email or web address] 02/02/2015, 2:06 PM   LOS: 3 days   Care during the described time interval was provided by me .  I have reviewed this patient's available data, including medical history, events of note, physical examination, and all test results as part of my evaluation. I have personally reviewed and interpreted all radiology studies.   Carolyne Littles, MD [phone] Pager

## 2015-02-02 NOTE — Progress Notes (Signed)
Pt was able to void into clean urinal for UA and UC. In and out not needed.

## 2015-02-03 DIAGNOSIS — N189 Chronic kidney disease, unspecified: Secondary | ICD-10-CM

## 2015-02-03 DIAGNOSIS — E785 Hyperlipidemia, unspecified: Secondary | ICD-10-CM | POA: Diagnosis present

## 2015-02-03 LAB — BASIC METABOLIC PANEL
Anion gap: 8 (ref 5–15)
BUN: 38 mg/dL — AB (ref 6–20)
CHLORIDE: 100 mmol/L — AB (ref 101–111)
CO2: 28 mmol/L (ref 22–32)
CREATININE: 1.23 mg/dL (ref 0.61–1.24)
Calcium: 8.8 mg/dL — ABNORMAL LOW (ref 8.9–10.3)
GFR calc non Af Amer: >60 mL/min (ref >60)
GLUCOSE: 230 mg/dL — AB (ref 65–99)
Potassium: 3.6 mmol/L (ref 3.5–5.1)
Sodium: 136 mmol/L (ref 135–145)

## 2015-02-03 LAB — CARBOXYHEMOGLOBIN
Carboxyhemoglobin: 3.4 % — ABNORMAL HIGH (ref 0.5–1.5)
Methemoglobin: 1.1 % (ref 0.0–1.5)
O2 Saturation: 77 %
TOTAL HEMOGLOBIN: 10.9 g/dL — AB (ref 13.5–18.0)

## 2015-02-03 LAB — CBC
HCT: 33.3 % — ABNORMAL LOW (ref 39.0–52.0)
HEMOGLOBIN: 10.5 g/dL — AB (ref 13.0–17.0)
MCH: 23.3 pg — ABNORMAL LOW (ref 26.0–34.0)
MCHC: 31.5 g/dL (ref 30.0–36.0)
MCV: 73.8 fL — AB (ref 78.0–100.0)
Platelets: 81 10*3/uL — ABNORMAL LOW (ref 150–400)
RBC: 4.51 MIL/uL (ref 4.22–5.81)
RDW: 22.1 % — AB (ref 11.5–15.5)
WBC: 9.2 10*3/uL (ref 4.0–10.5)

## 2015-02-03 LAB — GLUCOSE, CAPILLARY
GLUCOSE-CAPILLARY: 224 mg/dL — AB (ref 65–99)
GLUCOSE-CAPILLARY: 203 mg/dL — AB (ref 65–99)
GLUCOSE-CAPILLARY: 256 mg/dL — AB (ref 65–99)
Glucose-Capillary: 216 mg/dL — ABNORMAL HIGH (ref 65–99)
Glucose-Capillary: 222 mg/dL — ABNORMAL HIGH (ref 65–99)
Glucose-Capillary: 268 mg/dL — ABNORMAL HIGH (ref 65–99)
Glucose-Capillary: 316 mg/dL — ABNORMAL HIGH (ref 65–99)

## 2015-02-03 MED ORDER — TORSEMIDE 20 MG PO TABS
20.0000 mg | ORAL_TABLET | Freq: Every day | ORAL | Status: DC
Start: 2015-02-03 — End: 2015-02-04
  Administered 2015-02-03 – 2015-02-04 (×2): 20 mg via ORAL
  Filled 2015-02-03 (×2): qty 1

## 2015-02-03 MED ORDER — ATORVASTATIN CALCIUM 40 MG PO TABS
40.0000 mg | ORAL_TABLET | Freq: Every day | ORAL | Status: DC
  Filled 2015-02-03: qty 1

## 2015-02-03 NOTE — Progress Notes (Signed)
Patient ID: Christian Wall, male   DOB: 1961-01-03, 54 y.o.   MRN: [id number]   Subjective:    Renal function continues to improve.   Awake and alert. Wants to go home. Denies SOB.   Co-ox 77%  .    Objective:   Weight Range:  Vital Signs:   Temp:  [97.3 F (36.3 C)-98.4 F (36.9 C)] 98.4 F (36.9 C) (07/30 0400) Pulse Rate:  [81-99] 95 (07/30 0600) Resp:  [15-20] 20 (07/30 0600) BP: (124-139)/(72-96) 131/77 mmHg (07/30 0600) SpO2:  [94 %-97 %] 97 % (07/30 0600) Weight:  [85.8 kg (189 lb 2.5 oz)] 85.8 kg (189 lb 2.5 oz) (07/30 0422) Last BM Date: 01/30/15  Weight change: Filed Weights   02/01/15 2300 02/02/15 0500 02/03/15 0422  Weight: 97.6 kg (215 lb 2.7 oz) 95.7 kg (210 lb 15.7 oz) 85.8 kg (189 lb 2.5 oz)    Intake/Output:   Intake/Output Summary (Last 24 hours) at 02/03/15 1107 Last data filed at 02/03/15 9798  Gross per 24 hour  Intake      0 ml  Output    625 ml  Net   -625 ml     Physical Exam: General: Alert and conversant  No resp difficulty HEENT: normal Neck: supple. JVP flat . Carotids 2+ bilat; no bruits. No lymphadenopathy or thryomegaly appreciated. Cor: PMI laterally displaced. Regular rate & rhythm. +s3 Lungs: clear Abdomen: soft, nontender, nondistended. No hepatosplenomegaly. No bruits or masses. Good bowel sounds. Extremities: no cyanosis, clubbing, rash, edema RUE PICC  Neuro: alert and oriented cranial nerves grossly intact. moves all 4 extremities w/o difficulty.  GU: Condom Cath   Telemetry: SR  Labs: Basic Metabolic Panel:  Recent Labs Lab 01/30/15 2020 01/31/15 0236 02/01/15 0208 02/02/15 0515 02/03/15 0530  NA 133* 135 139 137 136  K 5.9* 4.7 3.8 3.7 3.6  CL 95* 95* 98* 98* 100*  CO2 22 28 30 30 28   GLUCOSE 168* 157* 139* 219* 230*  BUN 106* 100* 70* 43* 38*  CREATININE 3.72* 3.17* 1.83* 1.26* 1.23  CALCIUM 9.1 8.9 9.0 9.0 8.8*  MG  --  1.8 1.8  --   --   PHOS  --  3.0 2.0* 3.0  --     Liver Function  Tests:  Recent Labs Lab 01/31/15 0236 02/01/15 0208 02/02/15 0515  ALBUMIN 3.2* 2.9* 2.9*   No results for input(s): LIPASE, AMYLASE in the last 168 hours. No results for input(s): AMMONIA in the last 168 hours.  CBC:  Recent Labs Lab 01/30/15 1205 01/30/15 1206 01/31/15 0236 02/01/15 0208 02/02/15 0500 02/03/15 0530  WBC 13.5*  --  11.1* 11.9* 10.4 9.2  NEUTROABS 10.5*  --   --   --   --   --   HGB 11.8* 14.3 10.3* 10.9* 11.0* 10.5*  HCT 38.3* 42.0 32.9* 33.3* 35.1* 33.3*  MCV 74.7*  --  73.1* 71.9* 74.2* 73.8*  PLT 104*  --  102* 97* 87* 81*     BNP: BNP (last 3 results)  Recent Labs  12/26/14 1130 01/30/15 1205 01/30/15 2020  BNP 1110.4* 3105.0* 2790.6*       Other results:  Imaging: No results found.   Medications:     Scheduled Medications: . antiseptic oral rinse  7 mL Mouth Rinse BID  . hydrALAZINE  75 mg Oral 3 times per day  . insulin aspart  0-9 Units Subcutaneous 6 times per day  . isosorbide mononitrate  30 mg Oral Daily  .  simvastatin  10 mg Oral q1800    Infusions:    PRN Medications: sodium chloride, traMADol   Assessment:   1) A/c systolic HF due to NICM EF 20-25% 2) Cardiogenic shock 3) Acute kidney injury 4) Hyperkalemia   Plan/Discussion:    Renal function improved. CO-OX improved  Start torsemide 20 mg  Continue hydralazine 75 tid and  imdur 30 daily. No beta blocker  for now.   Hold off on spiro. Dig level 1.8 on admit. Stop dig for now.  Ok to d/c in am    Length of Stay: 4

## 2015-02-03 NOTE — Progress Notes (Signed)
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 Sherene Sires, extensive work up in the past to include swallowing evaluation, GI evaluation and cessation of ACE-I on 12/18/14)   Presented to [facility] on 01/30/15 with complaints of weakness, fatigue, N/V and diarrhea.   The patient reports Milrinone gtt was discontinued on 7/21 and since that time he has felt more fatigued. He reported on 7/23, he had one episode of nausea / vomiting and diarrhea. Since that time, he has had progressive fatigue, decreased appetite. He denies fevers, chills, cough, chest pain, pain with inspiration.   ER work up was notable for Na 133, K >7.5, cl 99, sr Cr 4.30 (up from baseline of 1.2-1.4), BNP 3105, troponin 0.12, hgb 11.8, hct 38.3, platelets 104 (down from 325 in 11/2014). CXR assessment showed a PICC line that appeared to be looped back on itself in the neck, cardiomegaly without overt failure and mild R basilar atelectasis. Marland Kitchen   HPI/Subjective: 7/30 A/O 4, states negative CP, negative SOB, negative N/V. Sitting comfortably in chair.   Assessment/Plan: Chronic Systolic CHF (NICM), NYHA III  - On home Milrinone, d/c'd 7/21, after which patient became acutely ill and had to be admitted. -7/30 CVP= 6 -CHF team will determine if patient needs to return to home Milrinone prior to discharge. -Strict in and out since admission; -5.5 L -Daily weight; admission weight= 87 kg      7/30 bed weight= 85.8 kg  (new bed) -Continue hydralazine 75 mg TID -  Acute on chronic kidney failure stage III (baseline Cr 1.29-3.22) -Cr now within patient's normal range -Given patient's renal impairment would not restart patient on metformin.  Thrombocytopenia  -In May patient's platelets were WNL, does not appear patient received heparin products. Most likely secondary to acute illness -Ensure no heparin products used  -HIT panel pending  DM Type IIuncontrolled -Over the last year patient's hemoglobin A1c has gone from 15.2--> 7.4 showing significant improvement. - CBGs fairly well controlled continue with sensitive SSI  HLD -Lipid panel within ADA guidelines  -Change Zocor to Lipitor 40 mg daily  Peripheral neuropathy -Currently not complaining of symptoms, hold adding any medication -See depression  Depression -CrCl= 54.7 -If patient renal function continues to improve overnight would restart his Cymbalta at a lower dose (30 mg daily)  Tobacco Abuse  -Patient states stopped smoking ~2 months ago;encouraged to continue to not smoke. Smoking cessation      Code Status: FULL Family Communication: family present at time of exam Disposition Plan: Per cardiology    Consultants: Dr.Daniel R Bensimhon (heart failure team)    Procedure/Significant Events: 5/9 echocardiogram;- Left ventricle: The cavity size was mildly dilated. LVEF= 20% to 25%. Diffuse hypokinesis. Akinesis of basal-mid inferoseptal and apical septal myocardium. Akinesis of the entireinferior myocardium.-Right atrium: moderately dilated.-Tricuspid valve: moderate regurgitation. 7/26 US renal >> Negative for hydronephrosis.-Mildly increased cortical echogenicity suggestive of medical renal disease.-Small volume of perihepatic ascites.-Debris within the urinary bladder. 7/28IR FLUORO GUIDE CV LINE RIGHT placement  Culture 7/26 MRSA by PCR negative  Antibiotics: NA  DVT prophylaxis: SCD  Devices    LINES /  TUBES:  RUE PICC 7/28 >>     Continuous Infusions:   Objective: VITAL SIGNS: Temp: 97.7 F (36.5 C) (07/30 0800) Temp Source: Oral (07/30 0800) BP: 131/77 mmHg (07/30 0800) Pulse Rate: 93 (07/30 0800) SPO2; FIO2:   Intake/Output Summary (Last 24 hours) at 02/03/15 1422 Last data filed at 02/03/15 0350  Gross per 24 hour  Intake      0 ml  Output    625 ml  Net   -625 ml     Exam: General: A/O 4, sitting in chair comfortably, NAD, No acute respiratory distress Eyes: Negative headache, eye pain, double vision, negative scleral hemorrhage ENT: Negative Runny nose, negative ear pain, negative tinnitus, negative gingival bleeding Neck:  Negative scars, masses, torticollis, lymphadenopathy, JVD Lungs: Clear to auscultation bilaterally without wheezes or crackles Cardiovascular: Regular rate and rhythm without murmur gallop or rub normal S1 and S2, positive S3 Abdomen:negative abdominal pain, negative dysphagia, Nontender, nondistended, soft, bowel sounds positive, no rebound, no ascites, no appreciable mass Extremities: No significant cyanosis, clubbing, or edema bilateral lower extremities Psychiatric:  Negative depression, negative anxiety, negative fatigue, negative mania  Neurologic:  Cranial nerves II through XII intact, tongue/uvula midline, all extremities muscle strength 5/5, sensation intact throughout,  negative dysarthria, negative expressive aphasia, negative receptive aphasia.    Data Reviewed: Basic Metabolic Panel:  Recent Labs Lab 01/30/15 2020 01/31/15 0236 02/01/15 0208 02/02/15 0515 02/03/15 0530  NA 133* 135 139 137 136  K 5.9* 4.7 3.8 3.7 3.6  CL 95* 95* 98* 98* 100*  CO2 22 28 30 30 28   GLUCOSE 168* 157* 139* 219* 230*  BUN 106* 100* 70* 43* 38*  CREATININE 3.72* 3.17* 1.83* 1.26* 1.23  CALCIUM 9.1 8.9 9.0 9.0 8.8*  MG  --  1.8 1.8  --   --   PHOS  --  3.0 2.0* 3.0  --    Liver Function Tests:  Recent Labs Lab 01/31/15 0236  02/01/15 0208 02/02/15 0515  ALBUMIN 3.2* 2.9* 2.9*   No results for input(s): LIPASE, AMYLASE in the last 168 hours. No results for input(s): AMMONIA in the last 168 hours. CBC:  Recent Labs Lab 01/30/15 1205 01/30/15 1206 01/31/15 0236 02/01/15 0208 02/02/15 0500 02/03/15 0530  WBC 13.5*  --  11.1* 11.9* 10.4 9.2  NEUTROABS 10.5*  --   --   --   --   --   HGB 11.8* 14.3 10.3* 10.9* 11.0* 10.5*  HCT 38.3* 42.0 32.9* 33.3* 35.1* 33.3*  MCV 74.7*  --  73.1* 71.9* 74.2* 73.8*  PLT 104*  --  102* 97* 87* 81*   Cardiac Enzymes: No results for input(s): CKTOTAL, CKMB, CKMBINDEX, TROPONINI in the last 168 hours. BNP (last 3 results)  Recent Labs  12/26/14 1130 01/30/15 1205 01/30/15 2020  BNP 1110.4* 3105.0* 2790.6*    ProBNP (last 3 results) No results for input(s): PROBNP in the last 8760 hours.  CBG:  Recent Labs Lab 02/02/15 1300 02/02/15 1720 02/02/15 2044 02/03/15 0045 02/03/15 0417  GLUCAP 285* 269* 266* 216* 224*    Recent Results (from the past 240 hour(s))  MRSA PCR Screening     Status: None   Collection Time: 01/30/15  6:06 PM  Result Value Ref Range Status   MRSA by PCR NEGATIVE NEGATIVE Final    Comment:        The GeneXpert MRSA Assay (FDA approved for NASAL specimens only), is one component of a  comprehensive MRSA colonization surveillance program. It is not intended to diagnose MRSA infection nor to guide or monitor treatment for MRSA infections.   Urine culture     Status: None (Preliminary result)   Collection Time: 02/02/15 12:45 PM  Result Value Ref Range Status   Specimen Description URINE, RANDOM  Final   Special Requests NONE  Final   Culture >=100,000 COLONIES/mL ESCHERICHIA COLI  Final   Report Status PENDING  Incomplete     Studies:  Recent x-ray studies have been reviewed in detail by the Attending Physician  Scheduled Meds:  Scheduled Meds: . antiseptic oral rinse  7 mL Mouth Rinse BID  . hydrALAZINE  75 mg  Oral 3 times per day  . insulin aspart  0-9 Units Subcutaneous 6 times per day  . isosorbide mononitrate  30 mg Oral Daily  . simvastatin  10 mg Oral q1800  . torsemide  20 mg Oral Daily    Time spent on care of this patient: 40 mins   WOODS, Roselind Messier , MD  [facility] Office  [phone] Pager [phone]  On-Call/Text Page:      [email or web address]      password [email or web address]  If 7PM-7AM, please contact night-coverage [email or web address] Password [email or web address] 02/03/2015, 2:22 PM   LOS: 4 days   Care during the described time interval was provided by me .  I have reviewed this patient's available data, including medical history, events of note, physical examination, and all test results as part of my evaluation. I have personally reviewed and interpreted all radiology studies.   Carolyne Littles, MD [phone] Pager

## 2015-02-04 LAB — COMPREHENSIVE METABOLIC PANEL
ALK PHOS: 312 U/L — AB (ref 38–126)
ALT: 789 U/L — AB (ref 17–63)
AST: 113 U/L — AB (ref 15–41)
Albumin: 2.8 g/dL — ABNORMAL LOW (ref 3.5–5.0)
Anion gap: 13 (ref 5–15)
BILIRUBIN TOTAL: 4.9 mg/dL — AB (ref 0.3–1.2)
BUN: 33 mg/dL — AB (ref 6–20)
CO2: 28 mmol/L (ref 22–32)
CREATININE: 1.22 mg/dL (ref 0.61–1.24)
Calcium: 9 mg/dL (ref 8.9–10.3)
Chloride: 96 mmol/L — ABNORMAL LOW (ref 101–111)
GFR calc Af Amer: >60 mL/min (ref >60)
Glucose, Bld: 249 mg/dL — ABNORMAL HIGH (ref 65–99)
Potassium: 3.5 mmol/L (ref 3.5–5.1)
Sodium: 137 mmol/L (ref 135–145)
Total Protein: 6.6 g/dL (ref 6.5–8.1)

## 2015-02-04 LAB — CBC
HCT: 32.3 % — ABNORMAL LOW (ref 39.0–52.0)
HEMOGLOBIN: 10.6 g/dL — AB (ref 13.0–17.0)
MCH: 23.7 pg — AB (ref 26.0–34.0)
MCHC: 32.8 g/dL (ref 30.0–36.0)
MCV: 72.1 fL — ABNORMAL LOW (ref 78.0–100.0)
Platelets: 66 10*3/uL — ABNORMAL LOW (ref 150–400)
RBC: 4.48 MIL/uL (ref 4.22–5.81)
RDW: 22.2 % — AB (ref 11.5–15.5)
WBC: 8.4 10*3/uL (ref 4.0–10.5)

## 2015-02-04 LAB — CARBOXYHEMOGLOBIN
Carboxyhemoglobin: 2.6 % — ABNORMAL HIGH (ref 0.5–1.5)
Methemoglobin: 0.9 % (ref 0.0–1.5)
O2 Saturation: 70.3 %
Total hemoglobin: 10.3 g/dL — ABNORMAL LOW (ref 13.5–18.0)

## 2015-02-04 LAB — URINE CULTURE

## 2015-02-04 LAB — MAGNESIUM: Magnesium: 1.6 mg/dL — ABNORMAL LOW (ref 1.7–2.4)

## 2015-02-04 LAB — GLUCOSE, CAPILLARY: Glucose-Capillary: 193 mg/dL — ABNORMAL HIGH (ref 65–99)

## 2015-02-04 MED ORDER — DULOXETINE HCL 30 MG PO CPEP: 30.0000 mg | ORAL_CAPSULE | Freq: Every day | ORAL | Status: DC

## 2015-02-04 MED ORDER — TORSEMIDE 20 MG PO TABS: 20.0000 mg | ORAL_TABLET | Freq: Every day | ORAL | Status: DC

## 2015-02-04 MED ORDER — MAGNESIUM SULFATE 50 % IJ SOLN
3.0000 g | Freq: Once | INTRAVENOUS | Status: AC
  Administered 2015-02-04: 3 g via INTRAVENOUS
  Filled 2015-02-04: qty 6

## 2015-02-04 MED ORDER — ATORVASTATIN CALCIUM 40 MG PO TABS: 40.0000 mg | ORAL_TABLET | Freq: Every day | ORAL | Status: DC

## 2015-02-04 MED ORDER — ISOSORBIDE MONONITRATE ER 30 MG PO TB24: 30.0000 mg | ORAL_TABLET | Freq: Every day | ORAL | Status: DC

## 2015-02-04 MED ORDER — DULOXETINE HCL 30 MG PO CPEP
30.0000 mg | ORAL_CAPSULE | Freq: Every day | ORAL | Status: DC
  Administered 2015-02-04: 30 mg via ORAL
  Filled 2015-02-04: qty 1

## 2015-02-04 NOTE — Discharge Summary (Signed)
Physician Discharge Summary  Christian Wall CBU:[id number] DOB: 1960/08/31 DOA: 01/30/2015  PCP: Philis Fendt, MD  Admit date: 01/30/2015 Discharge date: 02/04/2015  Time spent: 40 minutes  Recommendations for Outpatient Follow-up:  Chronic Systolic CHF (NICM), NYHA III  - On home Milrinone, d/c'd 7/21, after which patient became acutely ill and had to be admitted. -7/31 CVP= 6 -Strict in and out since admission; - 6.3L -Daily weight; admission weight= 87 kg 7/31 bed weight= 84.4 kg (new bed) -Continue hydralazine 75 mg TID -Imdur long-acting 30 mg daily -Torsemide 20 mg daily -Spoke with Dr.Peter C Nishan (cardiology); has arranged appointment on 8/17 @ 0940  in the CHF clinic  Acute on chronic kidney failure stage III (baseline Cr 1.29-3.22) -Cr now within patient's normal range -Given patient's renal impairment would not restart patient on metformin.  Thrombocytopenia  -In May patient's platelets were WNL, does not appear patient received heparin products. Most likely secondary to acute illness -HIT panel still pending review findings at follow-up with PCP and cardiologist. -Follow-up with PCP in 7-14 days  DM Type IIuncontrolled -Over the last year patient's hemoglobin A1c has gone from 15.2--> 7.4 showing significant improvement. - Stool NOT restart metformin.   HLD -Lipid panel within ADA guidelines  -Change Zocor to Lipitor 40 mg daily  Peripheral neuropathy -Currently not complaining of symptoms, hold adding any medication -See depression  Depression -CrCl= 54.7 -If patient renal function continues to improve overnight would restart his Cymbalta at a lower dose (30 mg daily)  Tobacco Abuse  -Patient states stopped smoking ~2 months ago;encouraged to continue to not smoke. Smoking cessation      Discharge Diagnoses:  Active Problems:   Chronic systolic CHF (congestive heart failure)   Acute renal failure (ARF)   AKI (acute kidney injury)   Acute  kidney injury   Acute on chronic systolic CHF (congestive heart failure)   Acute renal failure superimposed on stage 3 chronic kidney disease   Thrombocytopenia   Diabetes type 2, uncontrolled   Depression   Tobacco abuse   Acute on chronic renal failure   HLD (hyperlipidemia)   Discharge Condition: Stable  Diet recommendation: Heart healthy/diabetic  Filed Weights   02/02/15 0500 02/03/15 0422 02/04/15 0440  Weight: 95.7 kg (210 lb 15.7 oz) 85.8 kg (189 lb 2.5 oz) 84.4 kg (186 lb 1.1 oz)    History of present illness:  54 y/o BM, PMHx Depression heavy smoker, Peripheral Neuropathy, Chronic Systolic CHF (suspected NICM, NYHA III) previously on home milrinone (recently stopped 7/21)HLD, DM Type 2, remote Pancreatitis complicated by prolonged hospitalization that resulted in 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 Melvyn Novas, extensive work up in the past to include swallowing evaluation, GI evaluation and cessation of ACE-I on 12/18/14)   Presented to [facility] on 01/30/15 with complaints of weakness, fatigue, N/V and diarrhea.   The patient reports Milrinone gtt was discontinued on 7/21 and since that time he has felt more fatigued. He reported on 7/23, he had one episode of nausea / vomiting and diarrhea. Since that time, he has had progressive fatigue, decreased appetite. He denies fevers, chills, cough, chest pain, pain with inspiration.   ER work up was notable for Na 133, K >7.5, cl 99, sr Cr 4.30 (up from baseline of 1.2-1.4), BNP 3105, troponin 0.12, hgb 11.8, hct 38.3, platelets 104 (down from 325 in 11/2014). CXR assessment showed a PICC line that appeared to be looped  back on itself in the neck, cardiomegaly without overt failure and mild R basilar atelectasis. .  During his hospitalization patient was diagnosed with acute on chronic renal failure thought to be brought on by his use  of milrinone for chronic systolic CHF. Milrinone was discontinued on 7/21 and patient's renal function has slowly improved. Upon discharge Cr 1.22. In addition patient was advised that his diabetes was still uncontrolled but significantly improved.   Consultants: Dr.Daniel R Bensimhon (heart failure team)    Procedure/Significant Events: 5/9 echocardiogram;- Left ventricle: The cavity size was mildly dilated. LVEF= 20% to 25%. Diffuse hypokinesis. Akinesis of basal-mid inferoseptal and apical septal myocardium. Akinesis of the entireinferior myocardium.-Right atrium: moderately dilated.-Tricuspid valve: moderate regurgitation. 7/26 US renal >> Negative for hydronephrosis.-Mildly increased cortical echogenicity suggestive of medical renal disease.-Small volume of perihepatic ascites.-Debris within the urinary bladder. 7/28IR FLUORO GUIDE CV LINE RIGHT placement  Culture 7/26 MRSA by PCR negative    Discharge Exam: Filed Vitals:   02/04/15 0440 02/04/15 0548 02/04/15 0800 02/04/15 0825  BP: 128/54 138/74  134/84  Pulse: 97  95 94  Temp: 97.6 F (36.4 C)   97.5 F (36.4 C)  TempSrc: Oral   Oral  Resp: 19  16 20   Height:      Weight: 84.4 kg (186 lb 1.1 oz)     SpO2: 93%  97% 99%    General: A/O 4, sitting in chair comfortably, NAD, No acute respiratory distress Eyes: Negative headache, eye pain, double vision, negative scleral hemorrhage ENT: Negative Runny nose, negative ear pain, negative tinnitus, negative gingival bleeding Neck: Negative scars, masses, torticollis, lymphadenopathy, JVD Lungs: Clear to auscultation bilaterally without wheezes or crackles Cardiovascular: Regular rate and rhythm without murmur gallop or rub normal S1 and S2, positive S3 Abdomen:negative abdominal pain, negative dysphagia, Nontender, nondistended, soft, bowel sounds positive, no rebound, no ascites, no appreciable mass    Discharge Instructions     Medication List    ASK your doctor  about these medications        acetaminophen 500 MG tablet  Commonly known as:  TYLENOL  Take 2 tablets (1,000 mg total) by mouth every 6 (six) hours as needed for mild pain or headache.     buPROPion 150 MG 12 hr tablet  Commonly known as:  WELLBUTRIN SR  Take 1 tablet (150 mg total) by mouth 2 (two) times daily.     CVS BLOOD GLUCOSE METER W/DEVICE Kit  Check blood sugars 4 times daily, with meals and at bedtime.     CVS VITAMIN B12 1000 MCG tablet  Generic drug:  cyanocobalamin  Take 1,000 mcg by mouth daily.     digoxin 0.125 MG tablet  Commonly known as:  LANOXIN  Take 1 tablet (0.125 mg total) by mouth daily.     DULoxetine 60 MG capsule  Commonly known as:  CYMBALTA  Take 60 mg by mouth daily.     esomeprazole 40 MG capsule  Commonly known as:  NEXIUM  Take 40 mg by mouth daily before breakfast.     glucose blood test strip  Commonly known as:  CVS BLOOD GLUCOSE TEST STRIPS  Use as instructed     hydrALAZINE 25 MG tablet  Commonly known as:  APRESOLINE  Take 3 tablets (75 mg total) by mouth every 8 (eight) hours.     insulin aspart 100 UNIT/ML injection  Commonly known as:  novoLOG  Inject 0-15 Units into the skin 3 (three) times daily with meals.  CBG 70 - 120: 0 units CBG 121 - 150: 2 units CBG 151 - 200: 3 units CBG 201 - 250: 5 units CBG 251 - 300: 8 units CBG 301 - 350: 11 units CBG 351 - 400: 15 units     insulin glargine 100 UNIT/ML injection  Commonly known as:  LANTUS  Inject 0.3-0.4 mLs (30-40 Units total) into the skin 2 (two) times daily. 40 U am, 30 U pm     isosorbide mononitrate 30 MG 24 hr tablet  Commonly known as:  IMDUR  Take 1 tablet (30 mg total) by mouth 2 (two) times daily.     lipase/protease/amylase 12000 UNITS Cpep capsule  Commonly known as:  CREON  Take 1-3 capsules by mouth 2 (two) times daily. 3 capsules before meal, and 1 capsule before snack     magnesium oxide 400 MG tablet  Commonly known as:  MAG-OX  Take 1 tablet (400 mg  total) by mouth 3 (three) times daily.     metFORMIN 850 MG tablet  Commonly known as:  GLUCOPHAGE  Take 850 mg by mouth 2 (two) times daily.     milrinone 20 MG/100ML Soln infusion  Commonly known as:  PRIMACOR  Inject 11.7375 mcg/min into the vein continuous.     PATADAY 0.2 % Soln  Generic drug:  Olopatadine HCl  Apply 1 drop to eye daily as needed. For allergies     pioglitazone 15 MG tablet  Commonly known as:  ACTOS  Take 15 mg by mouth daily.     pregabalin 150 MG capsule  Commonly known as:  LYRICA  Take 150 mg by mouth 2 (two) times daily.     simvastatin 10 MG tablet  Commonly known as:  ZOCOR  Take 10 mg by mouth daily at 6 PM.     spironolactone 25 MG tablet  Commonly known as:  ALDACTONE  Take 12.5 mg by mouth daily.     tiZANidine 4 MG tablet  Commonly known as:  ZANAFLEX  Take 4 mg by mouth 2 (two) times daily as needed for muscle spasms.     torsemide 20 MG tablet  Commonly known as:  DEMADEX  Take 79m daily alternating with 257mdaily.     traMADol 50 MG tablet  Commonly known as:  ULTRAM  Take 50 mg by mouth every 6 (six) hours as needed for moderate pain.       No Known Allergies     Follow-up Information    Follow up with BeGlori BickersMD. Go on 02/21/2015.   Specialty:  Cardiology   Why:  at 0940 am in the [facility]--gate code 0008--please bring all medications to appt   Contact information:   [address] [phone]      The results of significant diagnostics from this hospitalization (including imaging, microbiology, ancillary and laboratory) are listed below for reference.    Significant Diagnostic Studies: [place]  01/30/2015   CLINICAL DATA:  Acute renal failure.  EXAM: RENAL / URINARY TRACT ULTRASOUND COMPLETE  COMPARISON:  Abdomen and pelvis CT scan 11/11/2013. Renal ultrasound 11/14/2014.  FINDINGS: Right Kidney:  Length: 12.3 cm. Cortical echogenicity is  mildly increased. No stone or hydronephrosis. Perihepatic ascites is noted.  Left Kidney:  Length: 11.6 cm. No hydronephrosis. Cortical echogenicity is mildly increased. Two simple lower pole cysts are identified measuring 4.1 and 3.1 cm in diameter.  Bladder:  There is some  debris within urinary bladder.  IMPRESSION: Negative for hydronephrosis.  Mildly increased cortical echogenicity suggestive of medical renal disease.  Small volume of perihepatic ascites.  Debris within the urinary bladder.   Electronically Signed   By: Inge Rise M.D.   On: 01/30/2015 20:31   Ir Fluoro Guide Cv Line Right  02/01/2015   CLINICAL DATA:  Malpositioned existing right PICC  EXAM: IR RIGHT FLOURO GUIDE CV LINE  FLUOROSCOPY TIME:  12 seconds  MEDICATIONS AND MEDICAL HISTORY: None  ANESTHESIA/SEDATION: None  CONTRAST:  None  PROCEDURE: The procedure, risks, benefits, and alternatives were explained to the patient. Questions regarding the procedure were encouraged and answered. The patient understands and consents to the procedure.  The right arm was prepped with Betadine in a sterile fashion, and a sterile drape was applied covering the operative field. A sterile gown and sterile gloves were used for the procedure.  The existing PICC was cut and exchanged over a 018 wire for a new 41 cm double lumen PICC. This was advanced to the cavoatrial junction. It was flushed and sewn in place.  FINDINGS: The new PICC tip is at the cavoatrial junction.  COMPLICATIONS: None  IMPRESSION: Successful right upper extremity PICC exchange. The tip of the PICC is now at the cavoatrial junction.   Electronically Signed   By: Marybelle Killings M.D.   On: 02/01/2015 15:47   Dg Chest Port 1 View  01/30/2015   CLINICAL DATA:  Shortness of breath and cough for 2 weeks.  EXAM: PORTABLE CHEST - 1 VIEW  COMPARISON:  11/17/2014 prior radiographs  FINDINGS: Cardiomegaly again noted.  A right PICC line is present coiled over the lower right neck.   Defibrillator pads overlying the left chest identified.  Mild right basilar atelectasis has decreased.  There is no evidence of focal airspace disease, pulmonary edema, suspicious pulmonary nodule/mass, pleural effusion, or pneumothorax. No acute bony abnormalities are identified.  IMPRESSION: Right PICC line coiled overlying the lower right neck.  Cardiomegaly without evidence of acute cardiopulmonary disease.  Decrease mild right basilar atelectasis.   Electronically Signed   By: Margarette Canada M.D.   On: 01/30/2015 12:15    Microbiology: Recent Results (from the past 240 hour(s))  MRSA PCR Screening     Status: None   Collection Time: 01/30/15  6:06 PM  Result Value Ref Range Status   MRSA by PCR NEGATIVE NEGATIVE Final    Comment:        The GeneXpert MRSA Assay (FDA approved for NASAL specimens only), is one component of a comprehensive MRSA colonization surveillance program. It is not intended to diagnose MRSA infection nor to guide or monitor treatment for MRSA infections.   Urine culture     Status: None   Collection Time: 02/02/15 12:45 PM  Result Value Ref Range Status   Specimen Description URINE, RANDOM  Final   Special Requests NONE  Final   Culture >=100,000 COLONIES/mL ESCHERICHIA COLI  Final   Report Status 02/04/2015 FINAL  Final   Organism ID, Bacteria ESCHERICHIA COLI  Final      Susceptibility   Escherichia coli - MIC*    AMPICILLIN >=32 RESISTANT Resistant     CEFAZOLIN <=4 SENSITIVE Sensitive     CEFTRIAXONE <=1 SENSITIVE Sensitive     CIPROFLOXACIN >=4 RESISTANT Resistant     GENTAMICIN <=1 SENSITIVE Sensitive     IMIPENEM <=0.25 SENSITIVE Sensitive     NITROFURANTOIN <=16 SENSITIVE Sensitive     TRIMETH/SULFA >=320  RESISTANT Resistant     AMPICILLIN/SULBACTAM 16 INTERMEDIATE Intermediate     PIP/TAZO <=4 SENSITIVE Sensitive     * >=100,000 COLONIES/mL ESCHERICHIA COLI     Labs: Basic Metabolic Panel:  Recent Labs Lab 01/31/15 0236 02/01/15 0208  02/02/15 0515 02/03/15 0530 02/04/15 0550  NA 135 139 137 136 137  K 4.7 3.8 3.7 3.6 3.5  CL 95* 98* 98* 100* 96*  CO2 28 30 30 28 28   GLUCOSE 157* 139* 219* 230* 249*  BUN 100* 70* 43* 38* 33*  CREATININE 3.17* 1.83* 1.26* 1.23 1.22  CALCIUM 8.9 9.0 9.0 8.8* 9.0  MG 1.8 1.8  --   --  1.6*  PHOS 3.0 2.0* 3.0  --   --    Liver Function Tests:  Recent Labs Lab 01/31/15 0236 02/01/15 0208 02/02/15 0515 02/04/15 0550  AST  --   --   --  113*  ALT  --   --   --  789*  ALKPHOS  --   --   --  312*  BILITOT  --   --   --  4.9*  PROT  --   --   --  6.6  ALBUMIN 3.2* 2.9* 2.9* 2.8*   No results for input(s): LIPASE, AMYLASE in the last 168 hours. No results for input(s): AMMONIA in the last 168 hours. CBC:  Recent Labs Lab 01/30/15 1205  01/31/15 0236 02/01/15 0208 02/02/15 0500 02/03/15 0530 02/04/15 0550  WBC 13.5*  --  11.1* 11.9* 10.4 9.2 8.4  NEUTROABS 10.5*  --   --   --   --   --   --   HGB 11.8*  < > 10.3* 10.9* 11.0* 10.5* 10.6*  HCT 38.3*  < > 32.9* 33.3* 35.1* 33.3* 32.3*  MCV 74.7*  --  73.1* 71.9* 74.2* 73.8* 72.1*  PLT 104*  --  102* 97* 87* 81* 66*  < > = values in this interval not displayed. Cardiac Enzymes: No results for input(s): CKTOTAL, CKMB, CKMBINDEX, TROPONINI in the last 168 hours. BNP: BNP (last 3 results)  Recent Labs  12/26/14 1130 01/30/15 1205 01/30/15 2020  BNP 1110.4* 3105.0* 2790.6*    ProBNP (last 3 results) No results for input(s): PROBNP in the last 8760 hours.  CBG:  Recent Labs Lab 02/03/15 1249 02/03/15 1700 02/03/15 2010 02/03/15 2355 02/04/15 0436  GLUCAP 268* 316* 256* 222* 193*       Signed:  Dia Crawford, MD [facility] [phone] pager

## 2015-02-04 NOTE — Progress Notes (Signed)
Patient is being discharged home in stable condition. Family accompanying patient. Pt is being discharged home with his PICC in place and flushed. All belongings with patient. Discharge instructions given and explained to patient and his family.

## 2015-02-04 NOTE — Progress Notes (Signed)
Patient ID: Christian Wall, male   DOB: [birth year], 54 y.o.   MRN: [id number]   Subjective:    Renal function back to baseline   Awake and alert. Wants to go home. Denies SOB.   Co-ox 70%  . CR 1.22  Good diuresis without milrinone    Objective:   Weight Range:  Vital Signs:   Temp:  [97.5 F (36.4 C)-98.2 F (36.8 C)] 97.5 F (36.4 C) (07/31 0825) Pulse Rate:  [45-99] 94 (07/31 0825) Resp:  [16-27] 20 (07/31 0825) BP: (116-138)/(54-90) 134/84 mmHg (07/31 0825) SpO2:  [93 %-99 %] 99 % (07/31 0825) Weight:  [84.4 kg (186 lb 1.1 oz)] 84.4 kg (186 lb 1.1 oz) (07/31 0440) Last BM Date: 02/03/15  Weight change: Filed Weights   02/02/15 0500 02/03/15 0422 02/04/15 0440  Weight: 95.7 kg (210 lb 15.7 oz) 85.8 kg (189 lb 2.5 oz) 84.4 kg (186 lb 1.1 oz)    Intake/Output:   Intake/Output Summary (Last 24 hours) at 02/04/15 1141 Last data filed at 02/03/15 1949  Gross per 24 hour  Intake      0 ml  Output   1100 ml  Net  -1100 ml     Physical Exam: General: Alert and conversant  No resp difficulty HEENT: normal Neck: supple. JVP flat . Carotids 2+ bilat; no bruits. No lymphadenopathy or thryomegaly appreciated. Cor: PMI laterally displaced. Regular rate & rhythm. +s3 Lungs: clear Abdomen: soft, nontender, nondistended. No hepatosplenomegaly. No bruits or masses. Good bowel sounds. Extremities: no cyanosis, clubbing, rash, edema RUE PICC  Neuro: alert and oriented cranial nerves grossly intact. moves all 4 extremities w/o difficulty.  GU: Condom Cath   Telemetry: SR  Labs: Basic Metabolic Panel:  Recent Labs Lab 01/31/15 0236 02/01/15 0208 02/02/15 0515 02/03/15 0530 02/04/15 0550  NA 135 139 137 136 137  K 4.7 3.8 3.7 3.6 3.5  CL 95* 98* 98* 100* 96*  CO2 GLUCOSE 157* 139* 219* 230* 249*  BUN 100* 70* 43* 38* 33*  CREATININE 3.17* 1.83* 1.26* 1.23 1.22  CALCIUM 8.9 9.0 9.0 8.8* 9.0  MG 1.8 1.8  --   --  1.6*  PHOS 3.0 2.0* 3.0  --   --      Liver Function Tests:  Recent Labs Lab 01/31/15 0236 02/01/15 0208 02/02/15 0515 02/04/15 0550  AST  --   --   --  113*  ALT  --   --   --  789*  ALKPHOS  --   --   --  312*  BILITOT  --   --   --  4.9*  PROT  --   --   --  6.6  ALBUMIN 3.2* 2.9* 2.9* 2.8*   No results for input(s): LIPASE, AMYLASE in the last 168 hours. No results for input(s): AMMONIA in the last 168 hours.  CBC:  Recent Labs Lab 01/30/15 1205  01/31/15 0236 02/01/15 0208 02/02/15 0500 02/03/15 0530 02/04/15 0550  WBC 13.5*  --  11.1* 11.9* 10.4 9.2 8.4  NEUTROABS 10.5*  --   --   --   --   --   --   HGB 11.8*  < > 10.3* 10.9* 11.0* 10.5* 10.6*  HCT 38.3*  < > 32.9* 33.3* 35.1* 33.3* 32.3*  MCV 74.7*  --  73.1* 71.9* 74.2* 73.8* 72.1*  PLT 104*  --  102* 97* 87* 81* 66*  < > = values in this interval not displayed.  BNP: BNP (last 3 results)  Recent Labs  12/26/14 1130 01/30/15 1205 01/30/15 2020  BNP 1110.4* 3105.0* 2790.6*       Other results:  Imaging: No results found.   Medications:     Scheduled Medications: . antiseptic oral rinse  7 mL Mouth Rinse BID  . atorvastatin  40 mg Oral q1800  . hydrALAZINE  75 mg Oral 3 times per day  . insulin aspart  0-9 Units Subcutaneous 6 times per day  . isosorbide mononitrate  30 mg Oral Daily  . magnesium sulfate 1 - 4 g bolus IVPB  3 g Intravenous Once  . torsemide  20 mg Oral Daily    Infusions:    PRN Medications: sodium chloride, traMADol   Assessment:   1) A/c systolic HF due to NICM EF 20-25% 2) Cardiogenic shock 3) Acute kidney injury 4) Hyperkalemia   Plan/Discussion:    Renal function improved. CO-OX improved  On torsemide 20 mg   Continue hydralazine 75 tid and  imdur 30 daily. No beta blocker  for now.   No digoxin or aldcatone.  Given renal failure ? Ppt by hypotension and milrinone told him to not restart it at home  And should f/u with CHF Clinic next week  Will send text message to DB/DM.      Length of Stay: 5

## 2015-02-05 ENCOUNTER — Other Ambulatory Visit (HOSPITAL_COMMUNITY): Payer: Medicare Other

## 2015-02-05 LAB — HEPARIN INDUCED PLATELET AB (HIT ANTIBODY): HEPARIN INDUCED PLT AB: 0.342 {OD_unit} (ref 0.000–0.400)

## 2015-02-07 ENCOUNTER — Encounter (HOSPITAL_COMMUNITY): Payer: Self-pay

## 2015-02-07 NOTE — Progress Notes (Signed)
[facility] HH RN called to ask if any labs were needed at upcoming home visit and what plan of care would be now that patient is off milrinone.  Faxed order for cmet and mag draw, and note stating plan of care will be addressed at patient's upcoming post hosp appointment 02/21/15.  Ave Filter

## 2015-02-08 ENCOUNTER — Encounter (HOSPITAL_COMMUNITY): Payer: Medicare Other

## 2015-02-13 ENCOUNTER — Encounter: Payer: Self-pay | Admitting: Internal Medicine

## 2015-02-20 ENCOUNTER — Encounter (HOSPITAL_COMMUNITY): Payer: Medicare Other

## 2015-02-21 ENCOUNTER — Other Ambulatory Visit (HOSPITAL_COMMUNITY): Payer: Self-pay | Admitting: Internal Medicine

## 2015-02-21 ENCOUNTER — Other Ambulatory Visit (HOSPITAL_COMMUNITY): Payer: Self-pay | Admitting: Adult Health

## 2015-02-21 ENCOUNTER — Inpatient Hospital Stay (HOSPITAL_COMMUNITY)
Admission: AD | Admit: 2015-02-21 | Discharge: 2015-03-01 | DRG: 252 | Disposition: A | Discharge status: Home Health | Payer: Medicare Other | Source: Ambulatory Visit | Attending: Internal Medicine | Admitting: Internal Medicine

## 2015-02-21 ENCOUNTER — Inpatient Hospital Stay (HOSPITAL_COMMUNITY): Payer: Medicare Other

## 2015-02-21 ENCOUNTER — Encounter (HOSPITAL_COMMUNITY): Payer: Self-pay

## 2015-02-21 ENCOUNTER — Ambulatory Visit (HOSPITAL_BASED_OUTPATIENT_CLINIC_OR_DEPARTMENT_OTHER)
Admit: 2015-02-21 | Discharge: 2015-02-21 | Disposition: A | Discharge status: Home / Self Care | Payer: Medicare Other | Attending: Internal Medicine | Admitting: Internal Medicine

## 2015-02-21 VITALS — BP 106/60 | HR 90 | Wt 191.5 lb

## 2015-02-21 DIAGNOSIS — I739 Peripheral vascular disease, unspecified: Secondary | ICD-10-CM

## 2015-02-21 DIAGNOSIS — D509 Iron deficiency anemia, unspecified: Secondary | ICD-10-CM | POA: Diagnosis present

## 2015-02-21 DIAGNOSIS — Z794 Long term (current) use of insulin: Secondary | ICD-10-CM

## 2015-02-21 DIAGNOSIS — I708 Atherosclerosis of other arteries: Secondary | ICD-10-CM | POA: Diagnosis present

## 2015-02-21 DIAGNOSIS — I998 Other disorder of circulatory system: Secondary | ICD-10-CM

## 2015-02-21 DIAGNOSIS — E11649 Type 2 diabetes mellitus with hypoglycemia without coma: Secondary | ICD-10-CM | POA: Diagnosis present

## 2015-02-21 DIAGNOSIS — I7092 Chronic total occlusion of artery of the extremities: Secondary | ICD-10-CM | POA: Diagnosis present

## 2015-02-21 DIAGNOSIS — F17210 Nicotine dependence, cigarettes, uncomplicated: Secondary | ICD-10-CM | POA: Diagnosis present

## 2015-02-21 DIAGNOSIS — R570 Cardiogenic shock: Secondary | ICD-10-CM

## 2015-02-21 DIAGNOSIS — L97219 Non-pressure chronic ulcer of right calf with unspecified severity: Secondary | ICD-10-CM | POA: Diagnosis present

## 2015-02-21 DIAGNOSIS — E871 Hypo-osmolality and hyponatremia: Secondary | ICD-10-CM | POA: Diagnosis not present

## 2015-02-21 DIAGNOSIS — I130 Hypertensive heart and chronic kidney disease with heart failure and stage 1 through stage 4 chronic kidney disease, or unspecified chronic kidney disease: Secondary | ICD-10-CM | POA: Diagnosis present

## 2015-02-21 DIAGNOSIS — E780 Pure hypercholesterolemia: Secondary | ICD-10-CM | POA: Diagnosis present

## 2015-02-21 DIAGNOSIS — Y95 Nosocomial condition: Secondary | ICD-10-CM

## 2015-02-21 DIAGNOSIS — G5790 Unspecified mononeuropathy of unspecified lower limb: Secondary | ICD-10-CM | POA: Diagnosis present

## 2015-02-21 DIAGNOSIS — J189 Pneumonia, unspecified organism: Secondary | ICD-10-CM | POA: Diagnosis not present

## 2015-02-21 DIAGNOSIS — I745 Embolism and thrombosis of iliac artery: Secondary | ICD-10-CM | POA: Diagnosis present

## 2015-02-21 DIAGNOSIS — E1151 Type 2 diabetes mellitus with diabetic peripheral angiopathy without gangrene: Secondary | ICD-10-CM | POA: Diagnosis present

## 2015-02-21 DIAGNOSIS — I509 Heart failure, unspecified: Secondary | ICD-10-CM | POA: Diagnosis not present

## 2015-02-21 DIAGNOSIS — E1140 Type 2 diabetes mellitus with diabetic neuropathy, unspecified: Secondary | ICD-10-CM | POA: Diagnosis present

## 2015-02-21 DIAGNOSIS — I428 Other cardiomyopathies: Secondary | ICD-10-CM | POA: Diagnosis present

## 2015-02-21 DIAGNOSIS — E1122 Type 2 diabetes mellitus with diabetic chronic kidney disease: Secondary | ICD-10-CM | POA: Diagnosis present

## 2015-02-21 DIAGNOSIS — I7409 Other arterial embolism and thrombosis of abdominal aorta: Secondary | ICD-10-CM | POA: Diagnosis present

## 2015-02-21 DIAGNOSIS — I5023 Acute on chronic systolic (congestive) heart failure: Secondary | ICD-10-CM

## 2015-02-21 DIAGNOSIS — R0602 Shortness of breath: Secondary | ICD-10-CM | POA: Diagnosis present

## 2015-02-21 DIAGNOSIS — N390 Urinary tract infection, site not specified: Secondary | ICD-10-CM | POA: Diagnosis not present

## 2015-02-21 DIAGNOSIS — G7281 Critical illness myopathy: Secondary | ICD-10-CM | POA: Diagnosis present

## 2015-02-21 DIAGNOSIS — N183 Chronic kidney disease, stage 3 (moderate): Secondary | ICD-10-CM | POA: Diagnosis present

## 2015-02-21 DIAGNOSIS — I5022 Chronic systolic (congestive) heart failure: Secondary | ICD-10-CM

## 2015-02-21 DIAGNOSIS — R0600 Dyspnea, unspecified: Secondary | ICD-10-CM

## 2015-02-21 LAB — COMPREHENSIVE METABOLIC PANEL
ALT: 35 U/L (ref 17–63)
ANION GAP: 10 (ref 5–15)
AST: 38 U/L (ref 15–41)
Albumin: 2.8 g/dL — ABNORMAL LOW (ref 3.5–5.0)
Alkaline Phosphatase: 448 U/L — ABNORMAL HIGH (ref 38–126)
BILIRUBIN TOTAL: 2 mg/dL — AB (ref 0.3–1.2)
BUN: 58 mg/dL — ABNORMAL HIGH (ref 6–20)
CHLORIDE: 94 mmol/L — AB (ref 101–111)
CO2: 30 mmol/L (ref 22–32)
Calcium: 9.3 mg/dL (ref 8.9–10.3)
Creatinine, Ser: 2.04 mg/dL — ABNORMAL HIGH (ref 0.61–1.24)
GFR calc Af Amer: 41 mL/min — ABNORMAL LOW (ref >60)
GFR, EST NON AFRICAN AMERICAN: 36 mL/min — AB (ref >60)
Glucose, Bld: 277 mg/dL — ABNORMAL HIGH (ref 65–99)
POTASSIUM: 4 mmol/L (ref 3.5–5.1)
Sodium: 134 mmol/L — ABNORMAL LOW (ref 135–145)
TOTAL PROTEIN: 7 g/dL (ref 6.5–8.1)

## 2015-02-21 LAB — MAGNESIUM: MAGNESIUM: 1.7 mg/dL (ref 1.7–2.4)

## 2015-02-21 LAB — GLUCOSE, CAPILLARY
GLUCOSE-CAPILLARY: 228 mg/dL — AB (ref 65–99)
GLUCOSE-CAPILLARY: 271 mg/dL — AB (ref 65–99)

## 2015-02-21 LAB — CARBOXYHEMOGLOBIN
Carboxyhemoglobin: 3.2 % — ABNORMAL HIGH (ref 0.5–1.5)
Methemoglobin: 1 % (ref 0.0–1.5)
O2 SAT: 43.6 %
TOTAL HEMOGLOBIN: 10.1 g/dL — AB (ref 13.5–18.0)

## 2015-02-21 LAB — CBC WITH DIFFERENTIAL/PLATELET
BASOS PCT: 0 % (ref 0–1)
Basophils Absolute: 0 10*3/uL (ref 0.0–0.1)
EOS PCT: 2 % (ref 0–5)
Eosinophils Absolute: 0.1 10*3/uL (ref 0.0–0.7)
HEMATOCRIT: 31.1 % — AB (ref 39.0–52.0)
Hemoglobin: 9.9 g/dL — ABNORMAL LOW (ref 13.0–17.0)
Lymphocytes Relative: 16 % (ref 12–46)
Lymphs Abs: 1.2 10*3/uL (ref 0.7–4.0)
MCH: 22.9 pg — ABNORMAL LOW (ref 26.0–34.0)
MCHC: 31.8 g/dL (ref 30.0–36.0)
MCV: 71.8 fL — AB (ref 78.0–100.0)
MONO ABS: 0.7 10*3/uL (ref 0.1–1.0)
MONOS PCT: 9 % (ref 3–12)
NEUTROS ABS: 5.6 10*3/uL (ref 1.7–7.7)
Neutrophils Relative %: 73 % (ref 43–77)
PLATELETS: 365 10*3/uL (ref 150–400)
RBC: 4.33 MIL/uL (ref 4.22–5.81)
RDW: 19.8 % — AB (ref 11.5–15.5)
WBC: 7.6 10*3/uL (ref 4.0–10.5)

## 2015-02-21 LAB — MRSA PCR SCREENING: MRSA by PCR: NEGATIVE

## 2015-02-21 LAB — BRAIN NATRIURETIC PEPTIDE: B NATRIURETIC PEPTIDE 5: 1554.1 pg/mL — AB (ref 0.0–100.0)

## 2015-02-21 MED ORDER — ENOXAPARIN SODIUM 30 MG/0.3ML ~~LOC~~ SOLN
30.0000 mg | Freq: Every day | SUBCUTANEOUS | Status: DC
  Administered 2015-02-21 – 2015-02-22 (×2): 30 mg via SUBCUTANEOUS
  Filled 2015-02-21 (×2): qty 0.3

## 2015-02-21 MED ORDER — ONDANSETRON HCL 4 MG/2ML IJ SOLN
4.0000 mg | Freq: Four times a day (QID) | INTRAMUSCULAR | Status: DC | PRN
  Administered 2015-02-23 – 2015-02-27 (×2): 4 mg via INTRAVENOUS
  Filled 2015-02-21 (×2): qty 2

## 2015-02-21 MED ORDER — HYDRALAZINE HCL 25 MG PO TABS
75.0000 mg | ORAL_TABLET | Freq: Three times a day (TID) | ORAL | Status: DC
Start: 2015-02-21 — End: 2015-02-28
  Administered 2015-02-21 – 2015-02-28 (×21): 75 mg via ORAL
  Filled 2015-02-21 (×41): qty 1

## 2015-02-21 MED ORDER — PANCRELIPASE (LIP-PROT-AMYL) 12000-38000 UNITS PO CPEP
3.0000 | ORAL_CAPSULE | Freq: Three times a day (TID) | ORAL | Status: DC
  Administered 2015-02-21 – 2015-03-01 (×23): 36000 [IU] via ORAL
  Filled 2015-02-21 (×26): qty 3

## 2015-02-21 MED ORDER — DULOXETINE HCL 30 MG PO CPEP
30.0000 mg | ORAL_CAPSULE | Freq: Every day | ORAL | Status: DC
  Administered 2015-02-22 – 2015-03-01 (×8): 30 mg via ORAL
  Filled 2015-02-21 (×9): qty 1

## 2015-02-21 MED ORDER — MILRINONE IN DEXTROSE 20 MG/100ML IV SOLN
0.1250 ug/kg/min | INTRAVENOUS | Status: DC
  Administered 2015-02-21: 0.125 ug/kg/min via INTRAVENOUS
  Filled 2015-02-21: qty 100

## 2015-02-21 MED ORDER — PIOGLITAZONE HCL 15 MG PO TABS: 15.0000 mg | ORAL_TABLET | Freq: Every day | ORAL | Status: DC

## 2015-02-21 MED ORDER — TRAMADOL HCL 50 MG PO TABS
50.0000 mg | ORAL_TABLET | Freq: Four times a day (QID) | ORAL | Status: DC | PRN
  Administered 2015-02-25: 50 mg via ORAL
  Filled 2015-02-21: qty 1

## 2015-02-21 MED ORDER — SODIUM CHLORIDE 0.9 % IJ SOLN
3.0000 mL | Freq: Two times a day (BID) | INTRAMUSCULAR | Status: DC
  Administered 2015-02-21 – 2015-03-01 (×14): 3 mL via INTRAVENOUS

## 2015-02-21 MED ORDER — INSULIN GLARGINE 100 UNIT/ML ~~LOC~~ SOLN
30.0000 [IU] | Freq: Every day | SUBCUTANEOUS | Status: DC
  Administered 2015-02-21: 30 [IU] via SUBCUTANEOUS
  Filled 2015-02-21 (×2): qty 0.3

## 2015-02-21 MED ORDER — INSULIN GLARGINE 100 UNIT/ML ~~LOC~~ SOLN
50.0000 [IU] | Freq: Every day | SUBCUTANEOUS | Status: DC
  Administered 2015-02-21: 50 [IU] via SUBCUTANEOUS
  Filled 2015-02-21 (×2): qty 0.5

## 2015-02-21 MED ORDER — ACETAMINOPHEN 325 MG PO TABS: 650.0000 mg | ORAL_TABLET | ORAL | Status: DC | PRN

## 2015-02-21 MED ORDER — PREGABALIN 50 MG PO CAPS: 150.0000 mg | ORAL_CAPSULE | Freq: Two times a day (BID) | ORAL | Status: DC

## 2015-02-21 MED ORDER — ACETAMINOPHEN 500 MG PO TABS: 1000.0000 mg | ORAL_TABLET | Freq: Four times a day (QID) | ORAL | Status: DC | PRN

## 2015-02-21 MED ORDER — TIZANIDINE HCL 4 MG PO TABS
4.0000 mg | ORAL_TABLET | Freq: Two times a day (BID) | ORAL | Status: DC | PRN
  Filled 2015-02-21: qty 1

## 2015-02-21 MED ORDER — SODIUM CHLORIDE 0.9 % IV SOLN: 250.0000 mL | INTRAVENOUS | Status: DC | PRN

## 2015-02-21 MED ORDER — FUROSEMIDE 10 MG/ML IJ SOLN
80.0000 mg | Freq: Once | INTRAMUSCULAR | Status: AC
Start: 2015-02-21 — End: 2015-02-21
  Administered 2015-02-21: 80 mg via INTRAVENOUS
  Filled 2015-02-21: qty 8

## 2015-02-21 MED ORDER — PANTOPRAZOLE SODIUM 40 MG PO TBEC
80.0000 mg | DELAYED_RELEASE_TABLET | Freq: Every day | ORAL | Status: DC
  Administered 2015-02-22 – 2015-03-01 (×8): 80 mg via ORAL
  Filled 2015-02-21 (×8): qty 2

## 2015-02-21 MED ORDER — PREGABALIN 50 MG PO CAPS
150.0000 mg | ORAL_CAPSULE | Freq: Two times a day (BID) | ORAL | Status: DC
  Administered 2015-02-21 – 2015-03-01 (×16): 150 mg via ORAL
  Filled 2015-02-21 (×32): qty 1

## 2015-02-21 MED ORDER — ISOSORBIDE MONONITRATE ER 30 MG PO TB24
30.0000 mg | ORAL_TABLET | Freq: Every day | ORAL | Status: DC
  Administered 2015-02-22 – 2015-03-01 (×8): 30 mg via ORAL
  Filled 2015-02-21 (×9): qty 1

## 2015-02-21 MED ORDER — ATORVASTATIN CALCIUM 40 MG PO TABS
40.0000 mg | ORAL_TABLET | Freq: Every day | ORAL | Status: DC
  Administered 2015-02-21 – 2015-02-28 (×8): 40 mg via ORAL
  Filled 2015-02-21 (×8): qty 1

## 2015-02-21 MED ORDER — SODIUM CHLORIDE 0.9 % IJ SOLN: 3.0000 mL | INTRAMUSCULAR | Status: DC | PRN

## 2015-02-21 NOTE — Progress Notes (Signed)
VASCULAR LAB PRELIMINARY  PRELIMINARY  PRELIMINARY  PRELIMINARY  Right Lower Ext. Arterial Duplex Completed. Monophasic flow noted throughout the right arterial system suggestive of aortic/iliac disease. There is a  greater than 50% stenosis at the mid SFA. There appeared to be one vessel runoff in the calf via the posterior tibial artery. The anterior tibial and peroneal arteries appeared occluded. Marilynne Halsted, BS, RDMS, RVT

## 2015-02-21 NOTE — Progress Notes (Signed)
REDS vest score: 38

## 2015-02-21 NOTE — H&P (Signed)
HPI: Christian Wall is a 54 yo male with h/o of systolic HF with EF 39-76%, polysubstance abuse, DM2, remote pancreatitis c/b prolonged hospitalization resulting in leg atrophy (?critical care myopathy) several years ago. Admitted in 5/16 with acute HF, cardiogenic shock and renal failure.   In 5/16, 2-D echocardiogram was performed and showed an EF of 20-25 percent. He was initially diuresed but developed cardiorenal syndrome and increasing creatinine (peak BUN/creatinine 72/3.22). Underwent RHC (LHC not done due to renal failure) which showed elevated filling pressures and cardiogenic shock. Started on milrinone and diuresed. Attempted to wean milrinone in hospital but this was unsuccessful. D/C weight 203.   Milrinone weaned off slowly in clinic (following co-oxs) at his request. Milrinone stopped on 01/25/15.  Admitted 01/30/15 for renal failure an(peak Cr 4.3) and hyperkalemia.He was felt to have ATN. He initially was treated with IVF and pressors. But these were stopped. CO-OX remained stable. Discharge weight was 186 pounds. .   Today he was evaluated in the HF clinic complaining RLE wounds. Weight was up 10 pounds from baseline. Skin sloughing on both feet R>L. Unable to weigh due to balance. Very limited by weak legs/atrophy, uses leg braces. Still smoking 1/2 ppd. Taking all medications.   [place] 11/13/14 RA = 29 RV = 51/21/30 PA = 48/29 (38) PCW = 30 Fick cardiac output/index = 3.8/1.7 Thermo CO/CI = 3.7/1.7 PVR = 2.9 WU SVR = 1,502  Ao sat = 100% PA sat = 45%, 45% Ao = 131/86 (100)  PFTs FVC 2.75 FEV1 2.1 FEV1/FVC ratio 76 TLC 4.16 DLCO 15.95  Labs:  11/21/14 K 4.4 Creatinine 1.53 12/19/2014: K 4.5 Creatinine 1.21 Mag 1.2  12/26/14 Co ox 70.1, BNP 1110.4 12/28/14 K 4.6, Creatinine 1.58, Mg 1.5 01/02/15 co-ox 57% Mag 1.1 Creatinine 1.47  01/2015: K 3.8 Creatinine 1.68 mag 1.1. Co-ox 75%.   ROS: All systems negative except as listed in HPI, PMH and Problem List.  SH:  Social  History   Social History  . Marital Status: Married    Spouse Name: N/A  . Number of Children: N/A  . Years of Education: N/A   Occupational History  . disabled    Social History Main Topics  . Smoking status: Current Every Day Smoker -- 1.00 packs/day for 30 years    Types: Cigarettes  . Smokeless tobacco: Never Used  . Alcohol Use: No     Comment: Quit after his brother's death in 25-Aug-2005 or 8   . Drug Use: No  . Sexual Activity: Yes   Other Topics Concern  . Not on file   Social History Narrative   Lives in [place] with wife, uses crutches or wheelchair to get around    FH:  Family History  Problem Relation Age of Onset  . Diabetes Mellitus II Sister   . CAD Brother   . Cancer Mother     ? type   . Cancer Father     ? type    Past Medical History  Diagnosis Date  . Diabetes mellitus   . Atrophy of calf muscles   . Depression   . GERD (gastroesophageal reflux disease)   . Peripheral neuropathy   . Hypercholesteremia   . Hx of tracheostomy   . Chronic systolic CHF (congestive heart failure), NYHA class 3     Current Outpatient Prescriptions  Medication Sig Dispense Refill  . acetaminophen (TYLENOL) 500 MG tablet Take 2 tablets (1,000 mg total) by mouth every 6 (six) hours as needed for  mild pain or headache. 30 tablet 0  . atorvastatin (LIPITOR) 40 MG tablet Take 1 tablet (40 mg total) by mouth daily at 6 PM. 30 tablet 0  . Blood Glucose Monitoring Suppl (CVS BLOOD GLUCOSE METER) W/DEVICE KIT Check blood sugars 4 times daily, with meals and at bedtime. 1 kit 0  . CVS VITAMIN B12 1000 MCG tablet Take 1,000 mcg by mouth daily.  5  . DULoxetine (CYMBALTA) 30 MG capsule Take 1 capsule (30 mg total) by mouth daily. 30 capsule 0  . esomeprazole (NEXIUM) 40 MG capsule Take 40 mg by mouth daily before breakfast.     . glucose blood (CVS BLOOD GLUCOSE TEST STRIPS) test strip Use as instructed 100 each 12  . hydrALAZINE (APRESOLINE) 25 MG tablet Take 3 tablets (75 mg total) by mouth every 8 (eight) hours. 270 tablet 11  . insulin aspart (NOVOLOG) 100 UNIT/ML injection Inject 0-15 Units into the skin 3 (three) times daily with meals. CBG 70 - 120: 0 units CBG 121 - 150: 2 units CBG 151 - 200: 3 units CBG 201 - 250: 5 units CBG 251 - 300: 8 units CBG 301 - 350: 11 units CBG 351 - 400: 15 units 10 mL 11  . insulin glargine (LANTUS) 100 UNIT/ML injection Inject 0.3-0.4 mLs (30-40 Units total) into the skin 2 (two) times daily. 40 U am, 30 U pm (Patient taking differently: Inject 30-40 Units into the skin 2 (two) times daily. 30 U am, 50 U pm) 10 mL 11  . isosorbide mononitrate (IMDUR) 30 MG 24 hr tablet Take 1 tablet (30 mg total) by mouth daily. 30 tablet 0  . lipase/protease/amylase (CREON-10/PANCREASE) 12000 UNITS CPEP Take 1-3 capsules by mouth 2 (two) times daily. 3 capsules before meal, and 1 capsule before snack    . Olopatadine HCl (PATADAY) 0.2 % SOLN Apply 1 drop to eye daily as needed. For allergies    . pioglitazone (ACTOS) 15 MG tablet Take 15 mg by mouth daily.  5  . pregabalin (LYRICA) 150 MG capsule Take 150 mg by mouth 2 (two) times daily.    Marland Kitchen tiZANidine (ZANAFLEX) 4 MG tablet Take 4 mg by mouth 2 (two) times daily as needed for muscle spasms.    Marland Kitchen torsemide (DEMADEX) 20 MG tablet Take 1 tablet (20 mg total) by mouth daily. 30 tablet 0  . traMADol (ULTRAM) 50 MG tablet Take 50 mg by mouth every 6 (six) hours as needed for moderate pain.      No current facility-administered medications for this encounter.   BP 106/60 mmHg  Pulse 90  Wt 191 lb 8 oz (86.864 kg)  SpO2 97%  PHYSICAL EXAM:  General: . No resp difficulty.   HEENT: normal Neck: supple. JVP 8-9. Carotids 2+ bilaterally; no bruits. No lymphadenopathy or  thryomegaly appreciated. Cor: PMI laterally displaced. Regular, no S3/S4, 2/6 MR Lungs: clear Abdomen: soft, obese nontender, nondistended. No hepatosplenomegaly. No bruits or masses. Good bowel sounds. Extremities: very cool. No clubbing, rash, R and LLE Trace edema. RUE PICC. Areas of necrosis and skin sloughing on dorsum of both feet R>>L. No palpable pulses .  Neuro: alert & orientedx3, cranial nerves grossly intact. Moves all 4 extremities w/o difficulty. Weak in bilateral legs. Affect pleasant.  ASSESSMENT & PLAN: 1. Acute/Chonic Systolic Heart Failure: Suspected nonischemic cardiomyopathy.  - NYHA IIIb. Volume status elevated. Give a dose 80 mg IV lasix. Check CO-OX now. IF low will need ot resume milrinone. - Hold off on  ACE, spiro and b-blocker due to shock and recent renal failure.  - Continue hydralazin/nitrates - If renal function remains stable will consider coronary angiography in the near future as we have never ruled out CAD/ischemic cardiomyopathy.  - Likely not candidate for VAD or transplant due to limited mobility with legs 2. CKD, stage III- Check BMEt now.  3. 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  4. DM II 5. Anemia, iron-deficiency 6. Tobacco use: Smoked over 30 years. Continues to smoke 1/2 ppd. 7.Limb ischemia: He has critical limb ischemia in setting of low cardiac output and PAD.  ABI ordered. VVS consulted.   8. Hypomagnesium: Have had problems with low mag. Currently on mag ox 400 mg TID. Recehck today  Admit today for suspected ischemic RLE. Check ABI now. May need to resume milrinone.    CLEGG,AMY NP-C  1:44 PM      Patient seen and examined with Darrick Grinder, NP. We discussed all aspects of the encounter. I agree with the assessment and plan as stated above.   Patient admitted from clinic with low output HF and LE lesions concerning for limb ischemia. Will restart inotropes.  VVS to see.  Glori Bickers MD

## 2015-02-21 NOTE — Progress Notes (Addendum)
Patient ID: Christian Wall, male   DOB: [birth year], 54 y.o.   MRN: [id number] PCP: Dr Jeanie Cooks Primary Cardiologist: Dr Haroldine Laws  HPI: Christian Wall is a 54 yo male with h/o of systolic HF with EF 29-51%, polysubstance abuse, DM2, remote pancreatitis c/b prolonged hospitalization resulting in leg atrophy (?critical care myopathy) several years ago. Admitted in 5/16 with acute HF, cardiogenic shock and renal failure.   In 5/16, 2-D echocardiogram was performed and showed an EF of 20-25 percent. He was initially diuresed but developed cardiorenal syndrome and increasing creatinine (peak BUN/creatinine 72/3.22). Underwent RHC (LHC not done due to renal failure) which showed elevated filling pressures and cardiogenic shock. Started on milrinone and diuresed. Attempted to wean milrinone in hospital but this was unsuccessful. D/C weight 203.   Milrinone weaned off slowly in clinic (following co-oxs) at his request. Milrinone stopped on 01/25/15.  Admitted 01/30/15 for renal failure an(peak Cr 4.3) and hyperkalemia.He was felt to have ATN. He initially was treated with IVF and pressors. But these were stopped. With coox 70% on day of d/c, was not placed back on milrinone. He diuresed 6.3 L that admission with a discharge weight of 186 lbs.   He returns for HF follow up. Says he feels ok. Can't tell if he is feeling better or worse off milrinone. Weight up 10 pounds. Skin sloughing on both feet R>L. nable to weigh due to balance.  Very limited by weak legs/atrophy, uses leg braces. Still smoking 1/2 ppd. Taking all medications.   [place] 11/13/14 RA = 29 RV = 51/21/30 PA = 48/29 (38) PCW = 30 Fick cardiac output/index = 3.8/1.7 Thermo CO/CI = 3.7/1.7 PVR = 2.9 WU SVR = 1,502  Ao sat = 100% PA sat = 45%, 45% Ao = 131/86 (100)  PFTs FVC 2.75 FEV1 2.1 FEV1/FVC ratio 76 TLC 4.16 DLCO 15.95  Labs:  11/21/14 K 4.4 Creatinine 1.53 12/19/2014: K 4.5 Creatinine 1.21 Mag 1.2  12/26/14 Co ox 70.1, BNP  1110.4 12/28/14 K 4.6, Creatinine 1.58, Mg 1.5 01/02/15 co-ox 57% Mag 1.1 Creatinine 1.47  01/2015: K 3.8 Creatinine 1.68 mag 1.1.  Co-ox 75%.   ROS: All systems negative except as listed in HPI, PMH and Problem List.  SH:  Social History   Social History  . Marital Status: Married    Spouse Name: N/A  . Number of Children: N/A  . Years of Education: N/A   Occupational History  . disabled    Social History Main Topics  . Smoking status: Current Every Day Smoker -- 1.00 packs/day for 30 years    Types: Cigarettes  . Smokeless tobacco: Never Used  . Alcohol Use: No     Comment: Quit after his brother's death in Sep 03, 2005 or 8   . Drug Use: No  . Sexual Activity: Yes   Other Topics Concern  . Not on file   Social History Narrative   Lives in [place] with wife, uses crutches or wheelchair to get around    FH:  Family History  Problem Relation Age of Onset  . Diabetes Mellitus II Sister   . CAD Brother   . Cancer Mother     ? type   . Cancer Father     ? type    Past Medical History  Diagnosis Date  . Diabetes mellitus   . Atrophy of calf muscles   . Depression   . GERD (gastroesophageal reflux disease)   . Peripheral neuropathy   . Hypercholesteremia   . Hx  of tracheostomy   . Chronic systolic CHF (congestive heart failure), NYHA class 3     Current Outpatient Prescriptions  Medication Sig Dispense Refill  . acetaminophen (TYLENOL) 500 MG tablet Take 2 tablets (1,000 mg total) by mouth every 6 (six) hours as needed for mild pain or headache. 30 tablet 0  . atorvastatin (LIPITOR) 40 MG tablet Take 1 tablet (40 mg total) by mouth daily at 6 PM. 30 tablet 0  . Blood Glucose Monitoring Suppl (CVS BLOOD GLUCOSE METER) W/DEVICE KIT Check blood sugars 4 times daily, with meals and at bedtime. 1 kit 0  . CVS VITAMIN B12 1000 MCG tablet Take 1,000 mcg by mouth daily.  5  . DULoxetine (CYMBALTA) 30 MG capsule Take 1 capsule (30 mg total) by mouth daily. 30 capsule 0  .  esomeprazole (NEXIUM) 40 MG capsule Take 40 mg by mouth daily before breakfast.    . glucose blood (CVS BLOOD GLUCOSE TEST STRIPS) test strip Use as instructed 100 each 12  . hydrALAZINE (APRESOLINE) 25 MG tablet Take 3 tablets (75 mg total) by mouth every 8 (eight) hours. 270 tablet 11  . insulin aspart (NOVOLOG) 100 UNIT/ML injection Inject 0-15 Units into the skin 3 (three) times daily with meals. CBG 70 - 120: 0 units CBG 121 - 150: 2 units CBG 151 - 200: 3 units CBG 201 - 250: 5 units CBG 251 - 300: 8 units CBG 301 - 350: 11 units CBG 351 - 400: 15 units 10 mL 11  . insulin glargine (LANTUS) 100 UNIT/ML injection Inject 0.3-0.4 mLs (30-40 Units total) into the skin 2 (two) times daily. 40 U am, 30 U pm (Patient taking differently: Inject 30-40 Units into the skin 2 (two) times daily. 30 U am, 50 U pm) 10 mL 11  . isosorbide mononitrate (IMDUR) 30 MG 24 hr tablet Take 1 tablet (30 mg total) by mouth daily. 30 tablet 0  . lipase/protease/amylase (CREON-10/PANCREASE) 12000 UNITS CPEP Take 1-3 capsules by mouth 2 (two) times daily. 3 capsules before meal, and 1 capsule before snack    . Olopatadine HCl (PATADAY) 0.2 % SOLN Apply 1 drop to eye daily as needed. For allergies    . pioglitazone (ACTOS) 15 MG tablet Take 15 mg by mouth daily.  5  . pregabalin (LYRICA) 150 MG capsule Take 150 mg by mouth 2 (two) times daily.    Marland Kitchen tiZANidine (ZANAFLEX) 4 MG tablet Take 4 mg by mouth 2 (two) times daily as needed for muscle spasms.    Marland Kitchen torsemide (DEMADEX) 20 MG tablet Take 1 tablet (20 mg total) by mouth daily. 30 tablet 0  . traMADol (ULTRAM) 50 MG tablet Take 50 mg by mouth every 6 (six) hours as needed for moderate pain.      No current facility-administered medications for this encounter.   BP 106/60 mmHg  Pulse 90  Wt 191 lb 8 oz (86.864 kg)  SpO2 97%  PHYSICAL EXAM:  General: . No resp difficulty. Arrived in wheel chair.  HEENT: normal Neck: supple. JVP 8-9Carotids 2+ bilaterally; no  bruits. No lymphadenopathy or thryomegaly appreciated. Cor: PMI laterally displaced. Regular,  no S3/S4, 2/6 MR Lungs: clear Abdomen: soft, obese nontender, nondistended. No hepatosplenomegaly. No bruits or masses. Good bowel sounds. Extremities: very cool. n0 clubbing, rash, R and LLE  Trace edema.  RUE PICC. Areas of necrosis and skin sloughing on dorsum of both feet R>>L. No palpable pulses.  Neuro: alert & orientedx3, cranial nerves grossly intact.  Moves all 4 extremities w/o difficulty. Weak in bilateral legs.  Affect pleasant.  ASSESSMENT & PLAN: 1. 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   - Hard to assess functional capacity but likely NYHA IIIb. Volume status elevated. Recent admission for renal failure due to ATN likely due to low cardiac output and hypotension. Currently legs very cool with evidence of limb ischemia. We will start back milrinone.  - Increase torsemide to 40 daily, (REDS reading today 38) - Hold off on ACE, spiro and b-blocker due to shock and recent renal failure.  - Continue hydralazin/nitrates - If renal function remains stable will consider coronary angiography in the near future as we have never ruled out CAD/ischemic cardiomyopathy.  - Will need repeat echo in next 1-2 month.  At that time, will need to decide on CRT-D versus ICD (QRS 156 msec but IVCD and not true LBBB).  - Likely not candidate for VAD or transplant due to limited mobility with legs 2. CKD, stage 3: Recheck today 3. 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  4.  DM II 5. Anemia, iron-deficiency 6. Tobacco use: Smoked over 30 years. Continues to smoke 1/2 ppd. Encouraged to quit completely. - Had considered wellbutrin in the past.   7.Limb ischemia: He has critical limb ischemia in setting of low cardiac output and PAD. Will admit and consult VVS. Discussed possible need for amputation.  8. Hypomagnesium:  Have had problems with low mag. Currently on  mag ox 400 mg TID. Recehck today   Shirley Friar PA-C  02/21/2015    Patient seen and examined with Oda Kilts, PA-C. We discussed all aspects of the encounter. I agree with the assessment and plan as stated above.   I have edited the note above to reflect my thoughts. Has significant LE ischemia. In setting of severe HF and PAD. Will admit and have VVS see. Draw co-ox. Restart milrinone.   Total time spent 45 minutes. Over half that time spent discussing above.   Malaisha Silliman,MD 10:05 AM

## 2015-02-21 NOTE — Progress Notes (Signed)
VASCULAR LAB PRELIMINARY  PRELIMINARY  PRELIMINARY  PRELIMINARY  ABI Completed.  Right sided ABI 0.60, Left sided ABI 0.79 -  Right TBI = 0.33,  Left TBI 0.45  Chasta Deshpande, Gerarda Gunther, RVT 02/21/2015, 4:41 PM

## 2015-02-21 NOTE — Progress Notes (Signed)
[name], NP notified of co-ox of 43.6 this afternoon. New orders given to start milrinone gtt. Will continue to monitor.

## 2015-02-21 NOTE — Consult Note (Signed)
Hospital Consult    Reason for Consult:  Ischemic right leg Referring Physician:  Bensimhon  MRN #:  [id number]  History of Present Illness: This is a 54 y.o. male pt who has a hx of systolic HF with an EF of 20-25%, polysubstance abuse, diabetes and remote pancreatitis, which he states he has surgery for years ago.    Back in May, he was admitted with CHF and developed cardiorenal syndrome and increasing creatinine and underwent RHC, which revealed elevated filling pressures and cardiogenic shock.  He was started on Milrinone and diuresed.  This was eventually weaned.    He was readmitted to the hospital in July for renal failure and hyperkalemia.  He was felt to have ATN.  He ws treated with IVF and pressors and were weaned.  He returned to Dr. Gala Romney today in the office and was found to have peeling of his skin on his right foot and was admitted to the hospital for further evaluation.  The pt states that he thought he would be going back home, but was admitted instead.  He states that he walks with crutches and has been too weak to use these.  He states he no longer drinks and that has been years since he stopped, however, she continues to smoke ~ 1/2 ppd.    He is on a statin for hypercholesterolemia.  He is on insulin for his diabetes.  VVS is consulted.  Of note, he states that he is afraid b/c his birthday is coming up in October and people die around their birthday and he is scared of dying.  Past Medical History  Diagnosis Date  . Diabetes mellitus   . Atrophy of calf muscles   . Depression   . GERD (gastroesophageal reflux disease)   . Peripheral neuropathy   . Hypercholesteremia   . Hx of tracheostomy   . Chronic systolic CHF (congestive heart failure), NYHA class 3     Past Surgical History  Procedure Laterality Date  . Pancreas surgery  2001    hx pancreatitis  . Anterior cervical decomp/discectomy fusion  04/13/2012    Procedure: ANTERIOR CERVICAL  DECOMPRESSION/DISCECTOMY FUSION 1 LEVEL;  Surgeon: Temple Pacini, MD;  Location: [facility];  Service: Neurosurgery;  Laterality: N/A;  Cervcial Five-Six Anterior Cervical Decompression and Fusion with Allograft and Plating  . Cardiac catheterization N/A 11/15/2014    Procedure: Right Heart Cath;  Surgeon: Dolores Patty, MD;  Location: [facility] INVASIVE CV LAB;  Service: Cardiovascular;  Laterality: N/A;    No Known Allergies  Prior to Admission medications   Medication Sig Start Date End Date Taking? Authorizing Provider  acetaminophen (TYLENOL) 500 MG tablet Take 2 tablets (1,000 mg total) by mouth every 6 (six) hours as needed for mild pain or headache. 11/21/14  Yes Rhonda G Barrett, PA-C  bisacodyl (DULCOLAX) 5 MG EC tablet Take 5 mg by mouth daily as needed for mild constipation or moderate constipation.   Yes Historical Provider, MD  buPROPion (WELLBUTRIN SR) 150 MG 12 hr tablet Take 150 mg by mouth 2 (two) times daily. 01/29/15  Yes Historical Provider, MD  [employer] VITAMIN B12 1000 MCG tablet Take 1,000 mcg by mouth daily. 10/15/14  Yes Historical Provider, MD  digoxin (LANOXIN) 0.125 MG tablet Take 125 mcg by mouth daily. 01/18/15  Yes Historical Provider, MD  DULoxetine (CYMBALTA) 30 MG capsule Take 1 capsule (30 mg total) by mouth daily. 02/04/15  Yes Drema Dallas, MD  esomeprazole (NEXIUM) 40  MG capsule Take 40 mg by mouth daily before breakfast.   Yes Historical Provider, MD  hydrALAZINE (APRESOLINE) 25 MG tablet Take 3 tablets (75 mg total) by mouth every 8 (eight) hours. 11/21/14  Yes Rhonda G Barrett, PA-C  insulin aspart (NOVOLOG) 100 UNIT/ML injection Inject 0-15 Units into the skin 3 (three) times daily with meals. CBG 70 - 120: 0 units CBG 121 - 150: 2 units CBG 151 - 200: 3 units CBG 201 - 250: 5 units CBG 251 - 300: 8 units CBG 301 - 350: 11 units CBG 351 - 400: 15 units Patient taking differently: Inject 0-15 Units into the skin 3 (three) times daily as needed for high blood  sugar. CBG 70 - 120: 0 units CBG 121 - 150: 2 units CBG 151 - 200: 3 units CBG 201 - 250: 5 units CBG 251 - 300: 8 units CBG 301 - 350: 11 units CBG 351 - 400: 15 units 11/13/13  Yes Maryann Mikhail, DO  insulin glargine (LANTUS) 100 UNIT/ML injection Inject 0.3-0.4 mLs (30-40 Units total) into the skin 2 (two) times daily. 40 U am, 30 U pm Patient taking differently: Inject 30-50 Units into the skin 2 (two) times daily. 30 U am, 50 U pm 11/21/14  Yes Rhonda G Barrett, PA-C  isosorbide mononitrate (IMDUR) 30 MG 24 hr tablet Take 1 tablet (30 mg total) by mouth daily. 02/04/15  Yes Drema Dallas, MD  lipase/protease/amylase (CREON-10/PANCREASE) 12000 UNITS CPEP Take 1-3 capsules by mouth 2 (two) times daily. 3 capsules before meal, and 1 capsule before snack   Yes Historical Provider, MD  magnesium oxide (MAG-OX) 400 (241.3 MG) MG tablet Take 400 mg by mouth 2 (two) times daily. 01/26/15  Yes Historical Provider, MD  Olopatadine HCl (PATADAY) 0.2 % SOLN Apply 1 drop to eye daily as needed. For allergies   Yes Historical Provider, MD  oxyCODONE (OXY IR/ROXICODONE) 5 MG immediate release tablet Take 5 mg by mouth every 6 (six) hours as needed for moderate pain or severe pain.  02/09/15  Yes Historical Provider, MD  pregabalin (LYRICA) 150 MG capsule Take 150 mg by mouth 2 (two) times daily.   Yes Historical Provider, MD  simvastatin (ZOCOR) 10 MG tablet Take 10 mg by mouth at bedtime. 01/16/15  Yes Historical Provider, MD  tiZANidine (ZANAFLEX) 4 MG tablet Take 4 mg by mouth 2 (two) times daily as needed for muscle spasms.   Yes Historical Provider, MD  torsemide (DEMADEX) 20 MG tablet Take 1 tablet (20 mg total) by mouth daily. 02/04/15  Yes Drema Dallas, MD  traMADol (ULTRAM) 50 MG tablet Take 50 mg by mouth every 6 (six) hours as needed for moderate pain.    Yes Historical Provider, MD  VENTOLIN HFA 108 (90 BASE) MCG/ACT inhaler Inhale 1-2 puffs into the lungs every 6 (six) hours as needed for wheezing  or shortness of breath.  02/15/15  Yes Historical Provider, MD  atorvastatin (LIPITOR) 40 MG tablet Take 1 tablet (40 mg total) by mouth daily at 6 PM. Patient not taking: Reported on 02/21/2015 02/04/15   Drema Dallas, MD    Social History   Social History  . Marital Status: Married    Spouse Name: N/A  . Number of Children: N/A  . Years of Education: N/A   Occupational History  . disabled    Social History Main Topics  . Smoking status: Current Every Day Smoker -- 1.00 packs/day for 30 years  Types: Cigarettes  . Smokeless tobacco: Never Used  . Alcohol Use: No     Comment: Quit after his brother's death in 12/03/2005 or 8   . Drug Use: No  . Sexual Activity: Yes   Other Topics Concern  . Not on file   Social History Narrative   Lives in [place] with wife, uses crutches or wheelchair to get around     Family History  Problem Relation Age of Onset  . Diabetes Mellitus II Sister   . CAD Brother   . Cancer Mother     ? type   . Cancer Father     ? type    ROS:  Positive    Negative    All sytems reviewed and are negative  Cardiovascular:  chest pain/pressure  hx CHF  palpitations  SOB lying flat  DOE  pain in legs while walking  pain in legs at rest  pain in legs at night  non-healing ulcers  hx of DVT  swelling in legs  Pulmonary:  productive cough  asthma/wheezing  home O2  Neurologic:  weakness in  arms  legs  numbness in  arms  legs  hx of CVA  mini stroke difficulty speaking or slurred speech  temporary loss of vision in one eye  dizziness  Hematologic:  hx of cancer  bleeding problems  problems with blood clotting easily  Endocrine:    diabetes  thyroid disease  GI  vomiting blood  blood in stool  GU:  CKD/renal failure  HD--[]  M/W/F or  T/T/S  burning with urination  blood in urine  Psychiatric:  anxiety   depression  Musculoskeletal:  arthritis  joint pain  Integumentary:  rashes  ulcers  Constitutional:  fever  chills   Physical Examination  Filed Vitals:   02/21/15 1352  BP: 116/75  Pulse: 93  Temp: 97.4 F (36.3 C)  Resp: 16   Body mass index is 29.3 kg/(m^2).  General:  WDWN in NAD Gait: Not observed HENT: WNL, normocephalic Pulmonary: normal non-labored breathing, without Rales, rhonchi,  wheezing Cardiac: regular, without  Murmurs, rubs or gallops; without carotid bruits Abdomen: soft, NT/ND, no masses Skin:  Peeling of skin on dorsum of right foot with with ischemic changes to 2nd, 3rd, 4th toes.   Vascular Exam/Pulses:  Right Left  Radial 2+ (normal) 2+ (normal)  Ulnar 1+ (weak) 1+ (weak)  Femoral Unable to palpate  Unable to palpate   DP +audible doppler signal +audible doppler signal  PT +audible doppler signal +audible doppler signal  Peroneal +audible doppler signal +audible doppler signal   Extremities: without ischemic changes to RLE and early, dark skin changes to dorsum of left foot just distal to big toe Musculoskeletal: no muscle wasting or atrophy  Neurologic: A&O X 3; Appropriate Affect ; SENSATION: normal; MOTOR FUNCTION:  moving all extremities equally. Speech is fluent/normal Psychiatric:  Normal affect capable of medical decision making Lymph:  No inguinal lymphadenopathy   CBC    Component Value Date/Time   WBC 8.4 02/04/2015 0550   RBC 4.48 02/04/2015 0550   RBC 4.21* 11/17/2014 0830   HGB 10.6* 02/04/2015 0550   HCT 32.3* 02/04/2015 0550   PLT 66* 02/04/2015 0550   MCV 72.1* 02/04/2015 0550   MCH 23.7* 02/04/2015 0550   MCHC 32.8 02/04/2015 0550   RDW 22.2* 02/04/2015 0550   LYMPHSABS 1.4 01/30/2015 1205   MONOABS 1.6* 01/30/2015 1205   EOSABS 0.0 01/30/2015 1205   BASOSABS  0.0 01/30/2015 1205    BMET    Component Value Date/Time   NA 137 02/04/2015 0550   K 3.5 02/04/2015 0550   CL 96* 02/04/2015 0550    CO2 28 02/04/2015 0550   GLUCOSE 249* 02/04/2015 0550   BUN 33* 02/04/2015 0550   CREATININE 1.22 02/04/2015 0550   CALCIUM 9.0 02/04/2015 0550   CALCIUM 12.5* 04/17/2008 0710   GFRNONAA >60 02/04/2015 0550   GFRAA >60 02/04/2015 0550    COAGS: No results found for: INR, PROTIME   Non-Invasive Vascular Imaging:   Arterial duplex and ABI's are pending (stat)  Statin:  Yes.   Beta Blocker:  No. Aspirin:  No. ACEI:  No. ARB:  No. Other antiplatelets/anticoagulants:  No.    ASSESSMENT/PLAN: This is a 54 y.o. male with hx of systolic heart failure with critical limb ischemia in setting of low cardiac output and PAD   -a stat RLE arterial duplex and ABI's have been ordered -pt with hx of renal failure-most recent BUN/Cr were 33/1.22 on 02/04/15.  Labs pending this admission -pt does have hx of thrombocytopenia, however this has improved on labs this admission.  Looks as if he had HIT testing done and was negative last admission. -depending on what the arterial duplex shows, pt may need arteriogram to look at arterial anatomy of BLE -pt states he is afraid b/c people die around their birthday and he is afraid b/c his birthday is coming up in October-could possibly benefit from a chaplin visit. -Dr. Hart Rochester will be in to see the pt this afternoon.   Doreatha Massed, PA-C [facility] [phone] Patient states that he is experiencing no pain in the right foot no change from previous status He does have chronic weakness and numbness of the lower extremities with bilateral foot drop from his previous generalized lower extremity weakness from prolonged hospitalization 17 years ago. He has peripheral neuropathy.  On examination there is some apparent sloughing of superficial epidermis and the distal foot anteriorly with what appears to be pink and adequately perfused tissue beneath this. There is no evidence of cellulitis or drainage or necrosis. He does have brisk  triphasic flow in his right posterior tibial and peroneal arteries with audible flow in the anterior tibial artery at the foot. Left foot has triphasic flow in posterior tibial and anterior tibial arteries.  Duplex suggests possibly aortoiliac occlusive disease and a stenosis in the right mid SFA Patient has chronic renal insufficiency stage III with elevated creatinine on this admission  I'm not convinced that the skin changes in the right foot are due to ischemia. It certainly looks atypical.  I would recommend following this for now to see if this improves. If not he can undergo CO2 angiography with possible stenting of iliac lesion if found and SFA lesion. He states he would like to go home because his legs feel no different than they have in the past.  We'll check back tomorrow to see if there is any change

## 2015-02-21 NOTE — Care Management Note (Signed)
Case Management Note  Patient Details  Name: Christian Wall MRN: [id number] Date of Birth: 1960/07/15  Subjective/Objective:       Adm w heart failure             Action/Plan:lives w wife, pcp dr Concepcion Elk, was on iv milrinone til 7-21 w [facility]   Expected Discharge Date:                  Expected Discharge Plan:  Home w Home Health Services  In-House Referral:     Discharge planning Services  CM Consult  Post Acute Care Choice:  Durable Medical Equipment, Home Health Choice offered to:  Patient  DME Arranged:  IV pump/equipment DME Agency:  [facility]  HH Arranged:  RN, Disease Management HH Agency:  [facility]  Status of Service:     Medicare Important Message Given:    Date Medicare IM Given:    Medicare IM give by:    Date Additional Medicare IM Given:    Additional Medicare Important Message give by:     If discussed at Long Length of Stay Meetings, dates discussed:    Additional Comments: ur review done. Pam w [facility] was contacted by heartfailure clinic that pt would need home milrinone again probably. Will follow.  Hanley Hays, RN 02/21/2015, 1:27 PM

## 2015-02-22 ENCOUNTER — Inpatient Hospital Stay (HOSPITAL_COMMUNITY): Payer: Medicare Other

## 2015-02-22 LAB — BASIC METABOLIC PANEL
Anion gap: 9 (ref 5–15)
BUN: 53 mg/dL — AB (ref 6–20)
CHLORIDE: 95 mmol/L — AB (ref 101–111)
CO2: 32 mmol/L (ref 22–32)
CREATININE: 1.81 mg/dL — AB (ref 0.61–1.24)
Calcium: 9.1 mg/dL (ref 8.9–10.3)
GFR calc non Af Amer: 41 mL/min — ABNORMAL LOW (ref >60)
GFR, EST AFRICAN AMERICAN: 48 mL/min — AB (ref >60)
GLUCOSE: 72 mg/dL (ref 65–99)
Potassium: 3.8 mmol/L (ref 3.5–5.1)
Sodium: 136 mmol/L (ref 135–145)

## 2015-02-22 LAB — GLUCOSE, CAPILLARY
GLUCOSE-CAPILLARY: 58 mg/dL — AB (ref 65–99)
GLUCOSE-CAPILLARY: 55 mg/dL — AB (ref 65–99)
GLUCOSE-CAPILLARY: 156 mg/dL — AB (ref 65–99)
GLUCOSE-CAPILLARY: 105 mg/dL — AB (ref 65–99)
Glucose-Capillary: 163 mg/dL — ABNORMAL HIGH (ref 65–99)
Glucose-Capillary: 25 mg/dL — CL (ref 65–99)
Glucose-Capillary: 86 mg/dL (ref 65–99)

## 2015-02-22 LAB — CARBOXYHEMOGLOBIN
Carboxyhemoglobin: 2.4 % — ABNORMAL HIGH (ref 0.5–1.5)
Carboxyhemoglobin: 2.3 % — ABNORMAL HIGH (ref 0.5–1.5)
Methemoglobin: 1 % (ref 0.0–1.5)
Methemoglobin: 1 % (ref 0.0–1.5)
O2 SAT: 50.5 %
O2 SAT: 60.5 %
Total hemoglobin: 9.6 g/dL — ABNORMAL LOW (ref 13.5–18.0)
Total hemoglobin: 8.9 g/dL — ABNORMAL LOW (ref 13.5–18.0)

## 2015-02-22 MED ORDER — BUPROPION HCL ER (SR) 150 MG PO TB12
150.0000 mg | ORAL_TABLET | Freq: Two times a day (BID) | ORAL | Status: DC
  Administered 2015-02-22 – 2015-02-26 (×9): 150 mg via ORAL
  Filled 2015-02-22 (×11): qty 1

## 2015-02-22 MED ORDER — INSULIN ASPART 100 UNIT/ML ~~LOC~~ SOLN
0.0000 [IU] | Freq: Three times a day (TID) | SUBCUTANEOUS | Status: DC
  Administered 2015-02-23 – 2015-02-24 (×4): 3 [IU] via SUBCUTANEOUS
  Administered 2015-02-24: 9 [IU] via SUBCUTANEOUS
  Administered 2015-02-25: 2 [IU] via SUBCUTANEOUS
  Administered 2015-02-25: 7 [IU] via SUBCUTANEOUS
  Administered 2015-02-25 – 2015-02-26 (×2): 5 [IU] via SUBCUTANEOUS
  Administered 2015-02-26: 7 [IU] via SUBCUTANEOUS

## 2015-02-22 MED ORDER — INSULIN GLARGINE 100 UNIT/ML ~~LOC~~ SOLN
20.0000 [IU] | Freq: Every day | SUBCUTANEOUS | Status: DC
  Filled 2015-02-22: qty 0.2

## 2015-02-22 MED ORDER — POTASSIUM CHLORIDE CRYS ER 20 MEQ PO TBCR
40.0000 meq | EXTENDED_RELEASE_TABLET | Freq: Two times a day (BID) | ORAL | Status: DC
Start: 2015-02-22 — End: 2015-02-24
  Administered 2015-02-22 – 2015-02-23 (×4): 40 meq via ORAL
  Filled 2015-02-22 (×7): qty 2

## 2015-02-22 MED ORDER — DEXTROSE 50 % IV SOLN
INTRAVENOUS | Status: AC
  Administered 2015-02-22: 25 mL
  Filled 2015-02-22: qty 50

## 2015-02-22 MED ORDER — INSULIN ASPART 100 UNIT/ML ~~LOC~~ SOLN: 0.0000 [IU] | Freq: Three times a day (TID) | SUBCUTANEOUS | Status: DC

## 2015-02-22 MED ORDER — MILRINONE IN DEXTROSE 20 MG/100ML IV SOLN
0.3750 ug/kg/min | INTRAVENOUS | Status: DC
  Administered 2015-02-22 – 2015-02-28 (×11): 0.25 ug/kg/min via INTRAVENOUS
  Filled 2015-02-22 (×12): qty 100

## 2015-02-22 MED ORDER — INSULIN GLARGINE 100 UNIT/ML ~~LOC~~ SOLN
40.0000 [IU] | Freq: Every day | SUBCUTANEOUS | Status: DC
  Administered 2015-02-22: 40 [IU] via SUBCUTANEOUS
  Filled 2015-02-22: qty 0.4

## 2015-02-22 MED ORDER — INSULIN ASPART 100 UNIT/ML ~~LOC~~ SOLN
0.0000 [IU] | Freq: Three times a day (TID) | SUBCUTANEOUS | Status: DC
  Administered 2015-02-22: 3 [IU] via SUBCUTANEOUS

## 2015-02-22 MED ORDER — MAGNESIUM OXIDE 400 (241.3 MG) MG PO TABS
400.0000 mg | ORAL_TABLET | Freq: Two times a day (BID) | ORAL | Status: DC
  Administered 2015-02-23 – 2015-02-26 (×7): 400 mg via ORAL
  Filled 2015-02-22 (×9): qty 1

## 2015-02-22 MED ORDER — ENOXAPARIN SODIUM 40 MG/0.4ML ~~LOC~~ SOLN
40.0000 mg | Freq: Every day | SUBCUTANEOUS | Status: DC
  Administered 2015-02-23 – 2015-02-28 (×6): 40 mg via SUBCUTANEOUS
  Filled 2015-02-22 (×8): qty 0.4

## 2015-02-22 MED ORDER — FUROSEMIDE 10 MG/ML IJ SOLN
80.0000 mg | Freq: Two times a day (BID) | INTRAMUSCULAR | Status: DC
  Administered 2015-02-22 (×2): 80 mg via INTRAVENOUS
  Filled 2015-02-22 (×4): qty 8

## 2015-02-22 MED ORDER — MAGNESIUM SULFATE 4 GM/100ML IV SOLN
4.0000 g | Freq: Once | INTRAVENOUS | Status: AC
  Administered 2015-02-22: 4 g via INTRAVENOUS
  Filled 2015-02-22 (×2): qty 100

## 2015-02-22 NOTE — Consult Note (Addendum)
WOC wound consult note Reason for Consult: Consult requested for right foot wound. Pt has been assessed by VVS team and ABI is  .6, which will make it difficult to promote healing.  WOC consult requested for topical treatment. Wound type: Patchy areas of black superficial eschar, no open wounds, odor, or drainage at this time.  Affected area is approx 10X10cm and involves anterior and some posterior foot locations and toes.  Dressing procedure/placement/frequency: Vaseline gauze to protect from further injury and promote moist healing.  Discussed plan of care with patient and wife at the bedside.  EMR indicates that VVS team plans to follow for further plan of care. Please re-consult if further assistance is needed.  Thank-you,  Cammie Mcgee MSN, RN, CWOCN, [place], CNS [phone]

## 2015-02-22 NOTE — Progress Notes (Signed)
Inpatient Diabetes Program Recommendations  AACE/ADA: New Consensus Statement on Inpatient Glycemic Control (2013)  Target Ranges:  Prepandial:   less than 140 mg/dL      Peak postprandial:   less than 180 mg/dL (1-2 hours)      Critically ill patients:  140 - 180 mg/dL   Inpatient Diabetes Program Recommendations Correction (SSI): add Novolog moderate scale per Glycemic Control order-set Thank you  Piedad Climes BSN, RN,CDE Inpatient Diabetes Coordinator [phone] (team pager)

## 2015-02-22 NOTE — Progress Notes (Signed)
Cbg- 25 mg/dl, complained of feeling dizzy and sleepy, dextrose 50% 25 ml iv  given,  Repeat cbg-15 min after 86 mg/dl. Claimed to feel much better. Encouraged to eat dinner.  PA made aware with order.

## 2015-02-22 NOTE — Progress Notes (Signed)
Patient ID: Christian Wall, male   DOB: 1960-09-21, 54 y.o.   MRN: [id number] Right foot examined No evidence of cellulitis or gangrene Tissue beneath darkened area of skin appears adequately perfused ABI is 0.6 in the right foot and patient does have fairly brisk flow and peroneal and posterior tibial arteries  This does not look like ischemic changes to me. I would be in favor of following this to see if this improves.  If not we could proceed with an angiogram with CO2 to see if angioplasty and stenting of the iliac and SFA would be indicated. Discussed this plan with patient and he is in agreement with no intervention at this time but basically to follow this

## 2015-02-22 NOTE — Progress Notes (Signed)
Advanced Heart Failure Rounding Note   Subjective:     Admitted from HF clinic with suspected ischemic foot and low output. CO-OX on admit 43%. VVS consulted. ABI -Duplex suggests possibly aortoiliac occlusive disease and a stenosis in the right mid SFA. Given 80 mg IV lasix.   CO-OX today 50% on Milrinone 0.125 mcg.   Complaining of cough. Mild dyspnea.    Objective:   Weight Range:  Vital Signs:   Temp:  [97.4 F (36.3 C)-98 F (36.7 C)] 98 F (36.7 C) (08/18 0441) Pulse Rate:  [93-105] 105 (08/18 0600) Resp:  [10-24] 15 (08/18 0600) BP: (108-120)/(69-92) 117/92 mmHg (08/18 0441) SpO2:  [91 %-98 %] 93 % (08/18 0600) Weight:  [187 lb 2.7 oz (84.9 kg)-192 lb 10.9 oz (87.4 kg)] 187 lb 2.7 oz (84.9 kg) (08/18 0457) Last BM Date: 02/19/15  Weight change: Filed Weights   02/21/15 1300 02/22/15 0457  Weight: 192 lb 10.9 oz (87.4 kg) 187 lb 2.7 oz (84.9 kg)    Intake/Output:   Intake/Output Summary (Last 24 hours) at 02/22/15 0729 Last data filed at 02/22/15 0600  Gross per 24 hour  Intake 939.17 ml  Output   1650 ml  Net -710.83 ml     Physical Exam: CVP 18   General: . No resp difficulty.  HEENT: normal Neck: supple. JVP to jaw . Carotids 2+ bilaterally; no bruits. No lymphadenopathy or thryomegaly appreciated. Cor: PMI laterally displaced. Regular, no S3/S4, 2/6 MR Lungs: Rhonchi  Abdomen: soft, obese nontender, nondistended. No hepatosplenomegaly. No bruits or masses. Good bowel sounds. Extremities: very cool. No clubbing, rash, R and LLE Trace edema. RUE PICC. Areas of necrosis and skin sloughing on dorsum of both feet R>>L. No palpable pulses .  Neuro: alert & orientedx3, cranial nerves grossly intact. Moves all 4 extremities w/o difficulty. Weak in bilateral legs. Affect pleasant.  Telemetry: NSR 90 s  Labs: Basic Metabolic Panel:  Recent Labs Lab 02/21/15 1415 02/22/15 0447  NA 134* 136  K 4.0 3.8  CL 94* 95*  CO2 30 32  GLUCOSE 277* 72   BUN 58* 53*  CREATININE 2.04* 1.81*  CALCIUM 9.3 9.1  MG 1.7  --     Liver Function Tests:  Recent Labs Lab 02/21/15 1415  AST 38  ALT 35  ALKPHOS 448*  BILITOT 2.0*  PROT 7.0  ALBUMIN 2.8*   No results for input(s): LIPASE, AMYLASE in the last 168 hours. No results for input(s): AMMONIA in the last 168 hours.  CBC:  Recent Labs Lab 02/21/15 1415  WBC 7.6  NEUTROABS 5.6  HGB 9.9*  HCT 31.1*  MCV 71.8*  PLT 365    Cardiac Enzymes: No results for input(s): CKTOTAL, CKMB, CKMBINDEX, TROPONINI in the last 168 hours.  BNP: BNP (last 3 results)  Recent Labs  01/30/15 1205 01/30/15 2020 02/21/15 1415  BNP 3105.0* 2790.6* 1554.1*    ProBNP (last 3 results) No results for input(s): PROBNP in the last 8760 hours.    Other results:  Imaging:  No results found.   Medications:     Scheduled Medications: . atorvastatin  40 mg Oral q1800  . DULoxetine  30 mg Oral Daily  . enoxaparin (LOVENOX) injection  30 mg Subcutaneous Daily  . hydrALAZINE  75 mg Oral 3 times per day  . insulin glargine  30 Units Subcutaneous Daily  . insulin glargine  50 Units Subcutaneous QHS  . isosorbide mononitrate  30 mg Oral Daily  . lipase/protease/amylase  3  capsule Oral TID AC  . pantoprazole  80 mg Oral Q1200  . pregabalin  150 mg Oral BID  . sodium chloride  3 mL Intravenous Q12H     Infusions: . milrinone 0.125 mcg/kg/min (02/21/15 1506)     PRN Medications:  sodium chloride, acetaminophen, acetaminophen, ondansetron (ZOFRAN) IV, sodium chloride, tiZANidine, traMADol   Assessment/Plan    1. Acute/Chonic Systolic Heart Failure: Suspected nonischemic cardiomyopathy. -> cardiogenic shock Volume status elevated. CVP 18. Give 80 mg IV lasix twice daily. CXR now.  CO-OX low on 0.125 mcg. Increase to 0.25 mcg. Repeat CO-OX at 1200.  - Hold off on ACE, spiro and b-blocker due to shock and recent renal failure.  - Continue hydralazin/nitrates - If renal  function remains stable will consider coronary angiography in the near future as we have never ruled out CAD/ischemic cardiomyopathy.  - Likely not candidate for VAD or transplant due to limited mobility with legs 2. Suspected. Limb ischemia:  ABI ordered. VVS following. consulted. ABI -Duplex suggests possibly aortoiliac occlusive disease and a stenosis in the right mid SFA. R foot warmer today.  2. CKD, stage III- Renal function ok today. .  3. 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  4. DM II- Adjust glucose per Diabetes coordinator.  5. Anemia, iron-deficiency 6. Tobacco use: Smoked over 30 years. Continues to smoke 1/2 ppd. 7.. Hypomagnesium: Mag 1.7. Give 4 grams mag now   Length of Stay: 1  CLEGG,AMY NP-C  02/22/2015, 7:29 AM  Advanced Heart Failure Team Pager [phone] (M-F; 7a - 4p)  Please contact [facility] for night-coverage after hours (4p -7a ) and weekends on [email or web address]  Patient seen and examined with [name], NP. We discussed all aspects of the encounter. I agree with the assessment and plan as stated above.   Co-ox reflects cardiogenic shock. CVP markedly elevated. Will increase milrinone and he will need to stay on home inotropes as he has now clearly failed wean 2x. Appreciate VVS consult appears to have LE wounds in setting of PAD and low cardiac output. Not full thickness at this point. Hopeful they will heal with improved cardiac output. Supp Mag. Diurese. Renal function improving on milrinone.   The patient is critically ill with multiple organ systems failure and requires high complexity decision making for assessment and support, frequent evaluation and titration of therapies, application of advanced monitoring technologies and extensive interpretation of multiple databases.   Critical Care Time devoted to patient care services described in this note is 35 Minutes.    Taj Arteaga,  Leathie Weich,MD 9:17 AM

## 2015-02-22 NOTE — Progress Notes (Signed)
CBG_58, asymptomatic, oj 200 ml given and breakfast given tolerated 90 %.. Repeat cbg- was 55. oj given tol.well. MD aware with order to  decrease lantus. Latest cbg- 156mg /dl. Continue to monitor.

## 2015-02-23 DIAGNOSIS — R570 Cardiogenic shock: Secondary | ICD-10-CM

## 2015-02-23 DIAGNOSIS — J189 Pneumonia, unspecified organism: Secondary | ICD-10-CM

## 2015-02-23 DIAGNOSIS — Y95 Nosocomial condition: Secondary | ICD-10-CM

## 2015-02-23 DIAGNOSIS — I5023 Acute on chronic systolic (congestive) heart failure: Secondary | ICD-10-CM

## 2015-02-23 DIAGNOSIS — I998 Other disorder of circulatory system: Secondary | ICD-10-CM

## 2015-02-23 LAB — CBC
HCT: 30.9 % — ABNORMAL LOW (ref 39.0–52.0)
Hemoglobin: 9.7 g/dL — ABNORMAL LOW (ref 13.0–17.0)
MCH: 22.3 pg — AB (ref 26.0–34.0)
MCHC: 31.4 g/dL (ref 30.0–36.0)
MCV: 71 fL — ABNORMAL LOW (ref 78.0–100.0)
PLATELETS: 346 10*3/uL (ref 150–400)
RBC: 4.35 MIL/uL (ref 4.22–5.81)
RDW: 19.7 % — ABNORMAL HIGH (ref 11.5–15.5)
WBC: 12.9 10*3/uL — ABNORMAL HIGH (ref 4.0–10.5)

## 2015-02-23 LAB — BASIC METABOLIC PANEL
ANION GAP: 10 (ref 5–15)
BUN: 44 mg/dL — ABNORMAL HIGH (ref 6–20)
CO2: 35 mmol/L — ABNORMAL HIGH (ref 22–32)
Calcium: 9 mg/dL (ref 8.9–10.3)
Chloride: 91 mmol/L — ABNORMAL LOW (ref 101–111)
Creatinine, Ser: 1.52 mg/dL — ABNORMAL HIGH (ref 0.61–1.24)
GFR, EST AFRICAN AMERICAN: 59 mL/min — AB (ref >60)
GFR, EST NON AFRICAN AMERICAN: 51 mL/min — AB (ref >60)
Glucose, Bld: 94 mg/dL (ref 65–99)
POTASSIUM: 3.8 mmol/L (ref 3.5–5.1)
SODIUM: 136 mmol/L (ref 135–145)

## 2015-02-23 LAB — GLUCOSE, CAPILLARY
GLUCOSE-CAPILLARY: 40 mg/dL — AB (ref 65–99)
GLUCOSE-CAPILLARY: 213 mg/dL — AB (ref 65–99)
GLUCOSE-CAPILLARY: 242 mg/dL — AB (ref 65–99)
GLUCOSE-CAPILLARY: 235 mg/dL — AB (ref 65–99)
Glucose-Capillary: 39 mg/dL — CL (ref 65–99)
Glucose-Capillary: 42 mg/dL — CL (ref 65–99)
Glucose-Capillary: 247 mg/dL — ABNORMAL HIGH (ref 65–99)

## 2015-02-23 LAB — CARBOXYHEMOGLOBIN
CARBOXYHEMOGLOBIN: 2 % — AB (ref 0.5–1.5)
METHEMOGLOBIN: 1 % (ref 0.0–1.5)
O2 Saturation: 65.3 %
TOTAL HEMOGLOBIN: 9.9 g/dL — AB (ref 13.5–18.0)

## 2015-02-23 LAB — MAGNESIUM: Magnesium: 2.4 mg/dL (ref 1.7–2.4)

## 2015-02-23 MED ORDER — LEVOFLOXACIN IN D5W 750 MG/150ML IV SOLN
750.0000 mg | INTRAVENOUS | Status: DC
  Administered 2015-02-23 – 2015-02-25 (×3): 750 mg via INTRAVENOUS
  Filled 2015-02-23 (×5): qty 150

## 2015-02-23 MED ORDER — DEXTROSE 50 % IV SOLN
INTRAVENOUS | Status: AC
  Administered 2015-02-23: 25 g via INTRAVENOUS
  Filled 2015-02-23: qty 50

## 2015-02-23 MED ORDER — PANTOPRAZOLE SODIUM 40 MG PO TBEC: 40.0000 mg | DELAYED_RELEASE_TABLET | Freq: Every day | ORAL | Status: DC

## 2015-02-23 MED ORDER — FUROSEMIDE 10 MG/ML IJ SOLN
15.0000 mg/h | INTRAVENOUS | Status: DC
  Administered 2015-02-23: 10 mg/h via INTRAVENOUS
  Administered 2015-02-24: 15 mg/h via INTRAVENOUS
  Administered 2015-02-24: 10 mg/h via INTRAVENOUS
  Administered 2015-02-25: 15 mg/h via INTRAVENOUS
  Filled 2015-02-23 (×10): qty 25

## 2015-02-23 MED ORDER — DEXTROSE 50 % IV SOLN
25.0000 g | INTRAVENOUS | Status: DC | PRN
  Administered 2015-02-23: 25 g via INTRAVENOUS

## 2015-02-23 NOTE — Progress Notes (Signed)
Patient ID: Christian Wall, male   DOB: 1961-01-21, 54 y.o.   MRN: [id number] Vascular Surgery Progress Note  Subjective: Skin changes right foot with possible aorto iliac, superficial femoral, and tibial occlusive disease. Patient continues to deny any pain although he does have decreased sensation due to chronic neuropathy. He is currently on Lasix drip to try to mobilize fluid.  Objective:  Filed Vitals:   02/23/15 0748  BP: 124/69  Pulse: 110  Temp: 98.2 F (36.8 C)  Resp: 17    General alert and oriented 3 in no apparent distress Right foot continues to have dark areas and distally this looks a little more bothersome today. Does continue to have Doppler flow which is fairly brisk in the posterior tibial and peroneal arteries right foot.   Labs:  Recent Labs Lab 02/21/15 1415 02/22/15 0447 02/23/15 0325  CREATININE 2.04* 1.81* 1.52*    Recent Labs Lab 02/21/15 1415 02/22/15 0447 02/23/15 0325  NA 134* 136 136  K 4.0 3.8 3.8  CL 94* 95* 91*  CO2 30 32 35*  BUN 58* 53* 44*  CREATININE 2.04* 1.81* 1.52*  GLUCOSE 277* 72 94  CALCIUM 9.3 9.1 9.0    Recent Labs Lab 02/21/15 1415 02/23/15 0325  WBC 7.6 12.9*  HGB 9.9* 9.7*  HCT 31.1* 30.9*  PLT 365 346   No results for input(s): INR in the last 168 hours.  I/O last 3 completed shifts: In: 1484 [P.O.:1207; I.V.:177; IV Piggyback:100] Out: 2200 [Urine:2200]  Imaging: Dg Chest Port 1 View  02/22/2015   CLINICAL DATA:  Nonproductive cough, dyspnea, hypoglycemia, renal failure.  EXAM: PORTABLE CHEST - 1 VIEW  COMPARISON:  Portable chest x-ray of January 30, 2015  FINDINGS: The lungs are adequately inflated. There is subsegmental atelectasis or infiltrate just above the right hemidiaphragm. The left lung is clear The cardiac silhouette remains enlarged. There is mild central pulmonary vascular prominence. The PICC line tip projects over the midportion of the SVC. There is degenerative change of the core though  clavicular articulation.  IMPRESSION: Infiltrate or atelectasis at the right lung base slightly more conspicuous today. Low-grade compensated CHF.   Electronically Signed   By: David  [place] M.D.   On: 02/22/2015 08:37    Assessment/Plan:   LOS: 2 days  s/p   Patient has evidence of possible aortoiliac disease and possible stenosis in mid right superficial femoral artery on duplex scanning. ABIs approximately 0.6 but does have brisk flow in posterior tibial and peroneal arteries right foot.  Because skin changes look a little more bothersome today will arrange for angiography with right lower extremity runoff by Dr. Cari Caraway on Monday using mostly CO2 and possibly some contrast. If lesion is amenable to PTA and stenting in either iliac and/or SFA possibly this will be performed at that time. Discussed this with patient and he would like to think about it over the weekend. Dr. Edilia Bo  will check on Sunday to see if patient is agreeable   Josephina Gip, MD 02/23/2015 9:53 AM

## 2015-02-23 NOTE — Progress Notes (Signed)
Advanced Heart Failure Rounding Note   Subjective:     Admitted from HF clinic with suspected ischemic foot and low output. CO-OX on admit 43%. VVS consulted. ABI -Duplex suggests possibly aortoiliac occlusive disease and a stenosis in the right mid SFA. Given 80 mg IV lasix.   Yesterday milrinone increased to 0.25 mcg and he continued to diurese with IV lasix. CO-OX today 65%. CXR concerning for RLL  Infiltrates. WBC 7.6>12.9. Renal function improving.   Complaining of cough. Ongoing dypsnea.    Creatinine 1.8>1.5  CO-OX 65%    Objective:   Weight Range:  Vital Signs:   Temp:  [97.5 F (36.4 C)-99 F (37.2 C)] 98.2 F (36.8 C) (08/19 0748) Pulse Rate:  [94-122] 110 (08/19 0748) Resp:  [17-27] 17 (08/19 0748) BP: (101-137)/(61-71) 124/69 mmHg (08/19 0748) SpO2:  [89 %-97 %] 97 % (08/19 0748) Weight:  [185 lb 13.6 oz (84.3 kg)] 185 lb 13.6 oz (84.3 kg) (08/19 0500) Last BM Date: 02/19/15  Weight change: Filed Weights   02/21/15 1300 02/22/15 0457 02/23/15 0500  Weight: 192 lb 10.9 oz (87.4 kg) 187 lb 2.7 oz (84.9 kg) 185 lb 13.6 oz (84.3 kg)    Intake/Output:   Intake/Output Summary (Last 24 hours) at 02/23/15 0757 Last data filed at 02/23/15 0500  Gross per 24 hour  Intake  794.4 ml  Output   1450 ml  Net -655.6 ml     Physical Exam: CVP 18  General: . No resp difficulty.  HEENT: normal Neck: supple. JVP to jaw . Carotids 2+ bilaterally; no bruits. No lymphadenopathy or thryomegaly appreciated. Cor: PMI laterally displaced. Regular, no S3/S4, 2/6 MR Lungs: Rhonchi throughout Abdomen: soft, obese nontender, nondistended. No hepatosplenomegaly. No bruits or masses. Good bowel sounds. Extremities: very cool. No clubbing, rash, R and LLE Trace edema. RUE PICC. Areas of necrosis and skin sloughing on dorsum of both feet R>>L. No palpable pulses .  Neuro: alert & orientedx3, cranial nerves grossly intact. Moves all 4 extremities w/o difficulty. Weak in  bilateral legs. Affect pleasant.  Telemetry: NSR 110 s  Labs: Basic Metabolic Panel:  Recent Labs Lab 02/21/15 1415 02/22/15 0447 02/23/15 0325  NA 134* 136 136  K 4.0 3.8 3.8  CL 94* 95* 91*  CO2 30 32 35*  GLUCOSE 277* 72 94  BUN 58* 53* 44*  CREATININE 2.04* 1.81* 1.52*  CALCIUM 9.3 9.1 9.0  MG 1.7  --  2.4    Liver Function Tests:  Recent Labs Lab 02/21/15 1415  AST 38  ALT 35  ALKPHOS 448*  BILITOT 2.0*  PROT 7.0  ALBUMIN 2.8*   No results for input(s): LIPASE, AMYLASE in the last 168 hours. No results for input(s): AMMONIA in the last 168 hours.  CBC:  Recent Labs Lab 02/21/15 1415 02/23/15 0325  WBC 7.6 12.9*  NEUTROABS 5.6  --   HGB 9.9* 9.7*  HCT 31.1* 30.9*  MCV 71.8* 71.0*  PLT 365 346    Cardiac Enzymes: No results for input(s): CKTOTAL, CKMB, CKMBINDEX, TROPONINI in the last 168 hours.  BNP: BNP (last 3 results)  Recent Labs  01/30/15 1205 01/30/15 2020 02/21/15 1415  BNP 3105.0* 2790.6* 1554.1*    ProBNP (last 3 results) No results for input(s): PROBNP in the last 8760 hours.    Other results:  Imaging: Dg Chest Port 1 View  02/22/2015   CLINICAL DATA:  Nonproductive cough, dyspnea, hypoglycemia, renal failure.  EXAM: PORTABLE CHEST - 1 VIEW  COMPARISON:  Portable chest x-ray of January 30, 2015  FINDINGS: The lungs are adequately inflated. There is subsegmental atelectasis or infiltrate just above the right hemidiaphragm. The left lung is clear The cardiac silhouette remains enlarged. There is mild central pulmonary vascular prominence. The PICC line tip projects over the midportion of the SVC. There is degenerative change of the core though clavicular articulation.  IMPRESSION: Infiltrate or atelectasis at the right lung base slightly more conspicuous today. Low-grade compensated CHF.   Electronically Signed   By: David  [place] M.D.   On: 02/22/2015 08:37     Medications:     Scheduled Medications: . atorvastatin  40 mg  Oral q1800  . buPROPion  150 mg Oral BID  . DULoxetine  30 mg Oral Daily  . enoxaparin (LOVENOX) injection  40 mg Subcutaneous Daily  . furosemide  80 mg Intravenous BID  . hydrALAZINE  75 mg Oral 3 times per day  . insulin aspart  0-9 Units Subcutaneous TID WC  . insulin glargine  20 Units Subcutaneous Daily  . insulin glargine  40 Units Subcutaneous QHS  . isosorbide mononitrate  30 mg Oral Daily  . lipase/protease/amylase  3 capsule Oral TID AC  . magnesium oxide  400 mg Oral BID  . pantoprazole  80 mg Oral Q1200  . potassium chloride  40 mEq Oral BID  . pregabalin  150 mg Oral BID  . sodium chloride  3 mL Intravenous Q12H    Infusions: . milrinone 0.25 mcg/kg/min (02/23/15 0116)    PRN Medications: sodium chloride, acetaminophen, acetaminophen, ondansetron (ZOFRAN) IV, sodium chloride, tiZANidine, traMADol   Assessment/Plan    1. Acute/Chonic Systolic Heart Failure: Suspected nonischemic cardiomyopathy. -> cardiogenic shock Volume status elevated. CVP 18. Stop intermittent IV lasix and start lasix drip 10 mg per hour.   CO-OX better today 65%. Continue milrinone 0.25 mcg.   - Hold off on ACE, spiro and b-blocker due to shock and recent renal failure.  - Continue hydralazin/nitrates - If renal function remains stable will consider coronary angiography in the near future as we have never ruled out CAD/ischemic cardiomyopathy.  - Likely not candidate for VAD or transplant due to limited mobility with legs 2. Suspected. Limb ischemia:  ABI ordered. VVS following. consulted. ABI -Duplex suggests possibly aortoiliac occlusive disease and a stenosis in the right mid SFA. R foot warmer today. Per Dr Hart Rochester can consider angiogram.  2. CKD, stage III- Renal function trending down.   3. 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  4. DM II- Adjust glucose per Diabetes coordinator. Hypoglycemia - Stop lantus .  Continue sliding scale.  5. Anemia, iron-deficiency 6. Tobacco use: Smoked over 30 years. Continues to smoke 1/2 ppd. 7.. Hypomagnesium: Received 4 grams Mg 8/18.  Mag 1.7>2.4  8. WBC trending up- Check UA. Sputum culture.  9. CXR concerning for pneumonia- Start levaquin.   Will need home milrinone. Consult CM for [facility] with [facility].   Length of Stay: 2  CLEGG,AMY NP-C  02/23/2015, 7:57 AM  Advanced Heart Failure Team Pager [phone] (M-F; 7a - 4p)  Please contact [facility] for night-coverage after hours (4p -7a ) and weekends on [email or web address]  Patient seen and examined with [name], NP. We discussed all aspects of the encounter. I agree with the assessment and plan as stated above.   He is somewhat improved. Cardiac output improved with mirlinone  - which he will need to go home on (failed outpatient weaning 2x).  Agree with lasix gtt. CXR concerning for PNA. Would start levofloxacin. R foot improving. Ischemic changes seem to be superficial and hopefully will improve with inotropic support and improved blood flow. Would keep in SDU through the weekend.   Nariah Morgano,MD 8:30 AM

## 2015-02-24 LAB — CARBOXYHEMOGLOBIN
Carboxyhemoglobin: 2.3 % — ABNORMAL HIGH (ref 0.5–1.5)
METHEMOGLOBIN: 1.1 % (ref 0.0–1.5)
O2 Saturation: 71.9 %
Total hemoglobin: 9.4 g/dL — ABNORMAL LOW (ref 13.5–18.0)

## 2015-02-24 LAB — GLUCOSE, CAPILLARY
GLUCOSE-CAPILLARY: 199 mg/dL — AB (ref 65–99)
Glucose-Capillary: 241 mg/dL — ABNORMAL HIGH (ref 65–99)
Glucose-Capillary: 370 mg/dL — ABNORMAL HIGH (ref 65–99)
Glucose-Capillary: 210 mg/dL — ABNORMAL HIGH (ref 65–99)

## 2015-02-24 LAB — BASIC METABOLIC PANEL
ANION GAP: 8 (ref 5–15)
BUN: 47 mg/dL — AB (ref 6–20)
CO2: 30 mmol/L (ref 22–32)
Calcium: 8.8 mg/dL — ABNORMAL LOW (ref 8.9–10.3)
Chloride: 96 mmol/L — ABNORMAL LOW (ref 101–111)
Creatinine, Ser: 1.6 mg/dL — ABNORMAL HIGH (ref 0.61–1.24)
GFR, EST AFRICAN AMERICAN: 55 mL/min — AB (ref >60)
GFR, EST NON AFRICAN AMERICAN: 48 mL/min — AB (ref >60)
Glucose, Bld: 261 mg/dL — ABNORMAL HIGH (ref 65–99)
POTASSIUM: 4.8 mmol/L (ref 3.5–5.1)
SODIUM: 134 mmol/L — AB (ref 135–145)

## 2015-02-24 MED ORDER — METOLAZONE 2.5 MG PO TABS
2.5000 mg | ORAL_TABLET | Freq: Once | ORAL | Status: AC
  Administered 2015-02-24: 2.5 mg via ORAL
  Filled 2015-02-24: qty 1

## 2015-02-24 NOTE — Progress Notes (Signed)
Advanced Heart Failure Rounding Note   Subjective:     Admitted from HF clinic with suspected ischemic foot and low output. CO-OX on admit 43%. VVS consulted. ABI -Duplex suggests possibly aortoiliac occlusive disease and a stenosis in the right mid SFA. Given 80 mg IV lasix.   Remains on levaquin for PNA. Now on milrinone and lasix gtt. Co-ox 72%. I/Os and weights inaccurate. CVP still 16-17  VVS worried about worsening tissue loss on R foot. Suggesting arteriogram. Mr. Bartnick refusing.   Creatinine 1.8>1.5 > 1.6 CO-OX 65% -> 72%   Objective:   Weight Range:  Vital Signs:   Temp:  [97.4 F (36.3 C)-98.5 F (36.9 C)] 97.5 F (36.4 C) (08/20 0802) Pulse Rate:  [90-108] 90 (08/20 0802) Resp:  [14-22] 22 (08/20 0802) BP: (98-120)/(63-74) 116/72 mmHg (08/20 0802) SpO2:  [95 %-98 %] 98 % (08/20 0802) Weight:  [91.309 kg (201 lb 4.8 oz)] 91.309 kg (201 lb 4.8 oz) (08/20 0422) Last BM Date: 02/19/15  Weight change: Filed Weights   02/22/15 0457 02/23/15 0500 02/24/15 0422  Weight: 84.9 kg (187 lb 2.7 oz) 84.3 kg (185 lb 13.6 oz) 91.309 kg (201 lb 4.8 oz)    Intake/Output:   Intake/Output Summary (Last 24 hours) at 02/24/15 0936 Last data filed at 02/24/15 0810  Gross per 24 hour  Intake   1667 ml  Output   1900 ml  Net   -233 ml     Physical Exam: CVP 116-17  General: . No resp difficulty.  HEENT: normal Neck: supple. JVP to jaw . Carotids 2+ bilaterally; no bruits. No lymphadenopathy or thryomegaly appreciated. Cor: PMI laterally displaced. Regular, no S3/S4, 2/6 MR Lungs: Rhonchi throughout Abdomen: soft, obese nontender, nondistended. No hepatosplenomegaly. No bruits or masses. Good bowel sounds. Extremities: very cool. No clubbing, rash, R and LLE Trace edema. RUE PICC. Areas of necrosis and skin sloughing on dorsum of both feet R>>L. No palpable pulses .  Neuro: alert & orientedx3, cranial nerves grossly intact. Moves all 4 extremities w/o difficulty.  Weak in bilateral legs. Affect pleasant.  Telemetry: NSR 100-110   Labs: Basic Metabolic Panel:  Recent Labs Lab 02/21/15 1415 02/22/15 0447 02/23/15 0325 02/24/15 0500  NA 134* 136 136 134*  K 4.0 3.8 3.8 4.8  CL 94* 95* 91* 96*  CO2 30 32 35* 30  GLUCOSE 277* 72 94 261*  BUN 58* 53* 44* 47*  CREATININE 2.04* 1.81* 1.52* 1.60*  CALCIUM 9.3 9.1 9.0 8.8*  MG 1.7  --  2.4  --     Liver Function Tests:  Recent Labs Lab 02/21/15 1415  AST 38  ALT 35  ALKPHOS 448*  BILITOT 2.0*  PROT 7.0  ALBUMIN 2.8*   No results for input(s): LIPASE, AMYLASE in the last 168 hours. No results for input(s): AMMONIA in the last 168 hours.  CBC:  Recent Labs Lab 02/21/15 1415 02/23/15 0325  WBC 7.6 12.9*  NEUTROABS 5.6  --   HGB 9.9* 9.7*  HCT 31.1* 30.9*  MCV 71.8* 71.0*  PLT 365 346    Cardiac Enzymes: No results for input(s): CKTOTAL, CKMB, CKMBINDEX, TROPONINI in the last 168 hours.  BNP: BNP (last 3 results)  Recent Labs  01/30/15 1205 01/30/15 2020 02/21/15 1415  BNP 3105.0* 2790.6* 1554.1*    ProBNP (last 3 results) No results for input(s): PROBNP in the last 8760 hours.    Other results:  Imaging: No results found.   Medications:     Scheduled  Medications: . atorvastatin  40 mg Oral q1800  . buPROPion  150 mg Oral BID  . DULoxetine  30 mg Oral Daily  . enoxaparin (LOVENOX) injection  40 mg Subcutaneous Daily  . hydrALAZINE  75 mg Oral 3 times per day  . insulin aspart  0-9 Units Subcutaneous TID WC  . isosorbide mononitrate  30 mg Oral Daily  . levofloxacin (LEVAQUIN) IV  750 mg Intravenous Q24H  . lipase/protease/amylase  3 capsule Oral TID AC  . magnesium oxide  400 mg Oral BID  . pantoprazole  80 mg Oral Q1200  . potassium chloride  40 mEq Oral BID  . pregabalin  150 mg Oral BID  . sodium chloride  3 mL Intravenous Q12H    Infusions: . furosemide (LASIX) infusion 10 mg/hr (02/24/15 0333)  . milrinone 0.25 mcg/kg/min (02/24/15  0000)    PRN Medications: sodium chloride, acetaminophen, acetaminophen, dextrose, ondansetron (ZOFRAN) IV, sodium chloride, tiZANidine, traMADol   Assessment/Plan    1. Acute/Chonic Systolic Heart Failure: Suspected nonischemic cardiomyopathy. -> cardiogenic shock Volume status remains elevated. CVP 16. Increase lasix gtt to  15. Give one dose metolazone CO-OX better today. Continue milrinone 0.25 mcg.  Will need home milrinone - Hold off on ACE, spiro and b-blocker due to shock and recent renal failure.  - Continue hydralazin/nitrates - If renal function remains stable will consider coronary angiography in the near future as we have never ruled out CAD/ischemic cardiomyopathy.  - Likely not candidate for VAD or transplant due to limited mobility with legs 2. Suspected. Limb ischemia:  VVS following. consulted. ABI -Duplex suggests possibly aortoiliac occlusive disease and a stenosis in the right mid SFA. R foot worse today. VVS suggesting arteriogram Monday. Patient initially refusing but RN and I spent an extensive amount of time discussing it with him today and he is willing to reconsider.  2. CKD, stage III- Renal function stable 3. 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  4. DM II- Adjust glucose per Diabetes coordinator. Lantus stopped due to low CGs 5. Anemia, iron-deficiency 6. Tobacco use: Smoked over 30 years. Continues to smoke 1/2 ppd. 7.. Hypomagnesium: Received 4 grams Mg 8/18.  Mag 1.7>2.4  8. PNA - on levofloxacin   Bensimhon, Daniel,MD 9:36 AM Advanced Heart Failure Team Pager [phone] (M-F; 7a - 4p)  Please contact [facility] for night-coverage after hours (4p -7a ) and weekends on [email or web address]

## 2015-02-24 NOTE — Progress Notes (Signed)
VASCULAR SURGERY:  Still not sure if he wants to proceed with agram on Monday.  I will discuss it with him again tomorrow. crt = 1.6  Waverly Ferrari, MD, FACS Beeper [phone]

## 2015-02-25 LAB — GLUCOSE, CAPILLARY
GLUCOSE-CAPILLARY: 321 mg/dL — AB (ref 65–99)
GLUCOSE-CAPILLARY: 198 mg/dL — AB (ref 65–99)
Glucose-Capillary: 237 mg/dL — ABNORMAL HIGH (ref 65–99)
Glucose-Capillary: 168 mg/dL — ABNORMAL HIGH (ref 65–99)

## 2015-02-25 LAB — BASIC METABOLIC PANEL
ANION GAP: 11 (ref 5–15)
BUN: 46 mg/dL — ABNORMAL HIGH (ref 6–20)
CALCIUM: 8.9 mg/dL (ref 8.9–10.3)
CO2: 29 mmol/L (ref 22–32)
Chloride: 91 mmol/L — ABNORMAL LOW (ref 101–111)
Creatinine, Ser: 1.88 mg/dL — ABNORMAL HIGH (ref 0.61–1.24)
GFR calc Af Amer: 45 mL/min — ABNORMAL LOW (ref >60)
GFR, EST NON AFRICAN AMERICAN: 39 mL/min — AB (ref >60)
GLUCOSE: 243 mg/dL — AB (ref 65–99)
Potassium: 4.4 mmol/L (ref 3.5–5.1)
Sodium: 131 mmol/L — ABNORMAL LOW (ref 135–145)

## 2015-02-25 LAB — CARBOXYHEMOGLOBIN
CARBOXYHEMOGLOBIN: 2.3 % — AB (ref 0.5–1.5)
METHEMOGLOBIN: 1 % (ref 0.0–1.5)
O2 Saturation: 70.1 %
Total hemoglobin: 9.2 g/dL — ABNORMAL LOW (ref 13.5–18.0)

## 2015-02-25 MED ORDER — METOLAZONE 2.5 MG PO TABS
2.5000 mg | ORAL_TABLET | Freq: Once | ORAL | Status: AC
  Administered 2015-02-25: 2.5 mg via ORAL
  Filled 2015-02-25: qty 1

## 2015-02-25 NOTE — Progress Notes (Signed)
   VASCULAR SURGERY ASSESSMENT & PLAN:  * The patient is now agreeable to proceed with arteriography tomorrow.  *  His creatinine was 1.88 yesterday which is up slightly. We will need to continue gentle hydration in anticipation of his arteriogram tomorrow. I may need to do the study with mostly CO2 given his renal insufficiency.  SUBJECTIVE: No specific complaints.  PHYSICAL EXAM: Filed Vitals:   02/25/15 0000 02/25/15 0005 02/25/15 0400 02/25/15 0651  BP:  113/54 108/59 127/65  Pulse: 103 103 104 101  Temp:  97.8 F (36.6 C)  97.6 F (36.4 C)  TempSrc:  Axillary  Oral  Resp: 16 13 17 16  Height:      Weight:    211 lb 13.8 oz (96.1 kg)  SpO2: 95% 95% 93% 94%   Right foot unchanged.  LABS: Lab Results  Component Value Date   WBC 12.9* 02/23/2015   HGB 9.7* 02/23/2015   HCT 30.9* 02/23/2015   MCV 71.0* 02/23/2015   PLT 346 02/23/2015   Lab Results  Component Value Date   CREATININE 1.88* 02/25/2015   CBG (last 3)   Recent Labs  02/24/15 1111 02/24/15 1744 02/24/15 2110  GLUCAP 370* 210* 199*    Active Problems:   Ischemic neuropathy of foot   Shock, cardiogenic   Lower limb ischemia   Hospital acquired PNA   Acute on chronic systolic heart failure   Chris Dickson Beeper: [phone] 02/25/2015    

## 2015-02-25 NOTE — Progress Notes (Addendum)
Patient ID: Christian Wall, male   DOB: September 30, 1960, 54 y.o.   MRN: [id number] Advanced Heart Failure Rounding Note   Subjective:     Admitted from HF clinic with suspected ischemic foot and low output. CO-OX on admit 43%. VVS consulted. ABI -Duplex suggests possibly aortoiliac occlusive disease and a stenosis in the right mid SFA.    Remains on levaquin for PNA. Now on milrinone and lasix gtt. Co-ox 70%. I/Os and weights inaccurate (weighed in bed today). CVP still 20.  VVS worried about worsening tissue loss on R foot. Suggesting arteriogram.  Patient is now agreeable to procedure tomorrow.    Creatinine 1.8>1.5 > 1.6 > 1.88 CO-OX 65% -> 72% -> 70%   Objective:   Weight Range:  Vital Signs:   Temp:  [97.6 F (36.4 C)-97.8 F (36.6 C)] 97.6 F (36.4 C) (08/21 0651) Pulse Rate:  [99-104] 101 (08/21 0651) Resp:  [13-21] 16 (08/21 0651) BP: (101-127)/(54-68) 127/65 mmHg (08/21 0651) SpO2:  [93 %-100 %] 94 % (08/21 0651) Weight:  [211 lb 13.8 oz (96.1 kg)] 211 lb 13.8 oz (96.1 kg) (08/21 0651) Last BM Date: 02/19/15  Weight change: Filed Weights   02/23/15 0500 02/24/15 0422 02/25/15 0651  Weight: 185 lb 13.6 oz (84.3 kg) 201 lb 4.8 oz (91.309 kg) 211 lb 13.8 oz (96.1 kg)    Intake/Output:   Intake/Output Summary (Last 24 hours) at 02/25/15 0953 Last data filed at 02/25/15 0800  Gross per 24 hour  Intake 1612.57 ml  Output    750 ml  Net 862.57 ml     Physical Exam: CVP 116-17  General: . No resp difficulty.  HEENT: normal Neck: supple. JVP to jaw . Carotids 2+ bilaterally; no bruits. No lymphadenopathy or thryomegaly appreciated. Cor: PMI laterally displaced. Regular, mildly tachycardic,+S3, 2/6 MR Lungs: Rhonchi throughout Abdomen: soft, obese nontender, nondistended. No hepatosplenomegaly. No bruits or masses. Good bowel sounds. Extremities: very cool. No clubbing, rash, 1+ edema to knees bilaterally. RUE PICC. Areas of necrosis and skin sloughing on dorsum  of both feet R>>L. No palpable pulses .  Neuro: alert & orientedx3, cranial nerves grossly intact. Moves all 4 extremities w/o difficulty. Weak in bilateral legs. Affect pleasant.  Telemetry: ST 100-110   Labs: Basic Metabolic Panel:  Recent Labs Lab 02/21/15 1415 02/22/15 0447 02/23/15 0325 02/24/15 0500 02/25/15 0439  NA 134* 136 136 134* 131*  K 4.0 3.8 3.8 4.8 4.4  CL 94* 95* 91* 96* 91*  CO2 30 32 35* 30 29  GLUCOSE 277* 72 94 261* 243*  BUN 58* 53* 44* 47* 46*  CREATININE 2.04* 1.81* 1.52* 1.60* 1.88*  CALCIUM 9.3 9.1 9.0 8.8* 8.9  MG 1.7  --  2.4  --   --     Liver Function Tests:  Recent Labs Lab 02/21/15 1415  AST 38  ALT 35  ALKPHOS 448*  BILITOT 2.0*  PROT 7.0  ALBUMIN 2.8*   No results for input(s): LIPASE, AMYLASE in the last 168 hours. No results for input(s): AMMONIA in the last 168 hours.  CBC:  Recent Labs Lab 02/21/15 1415 02/23/15 0325  WBC 7.6 12.9*  NEUTROABS 5.6  --   HGB 9.9* 9.7*  HCT 31.1* 30.9*  MCV 71.8* 71.0*  PLT 365 346    Cardiac Enzymes: No results for input(s): CKTOTAL, CKMB, CKMBINDEX, TROPONINI in the last 168 hours.  BNP: BNP (last 3 results)  Recent Labs  01/30/15 1205 01/30/15 2020 02/21/15 1415  BNP 3105.0* 2790.6* 1554.1*  ProBNP (last 3 results) No results for input(s): PROBNP in the last 8760 hours.    Other results:  Imaging: No results found.   Medications:     Scheduled Medications: . atorvastatin  40 mg Oral q1800  . buPROPion  150 mg Oral BID  . DULoxetine  30 mg Oral Daily  . enoxaparin (LOVENOX) injection  40 mg Subcutaneous Daily  . hydrALAZINE  75 mg Oral 3 times per day  . insulin aspart  0-9 Units Subcutaneous TID WC  . isosorbide mononitrate  30 mg Oral Daily  . levofloxacin (LEVAQUIN) IV  750 mg Intravenous Q24H  . lipase/protease/amylase  3 capsule Oral TID AC  . magnesium oxide  400 mg Oral BID  . metolazone  2.5 mg Oral Once  . pantoprazole  80 mg Oral Q1200  .  pregabalin  150 mg Oral BID  . sodium chloride  3 mL Intravenous Q12H    Infusions: . furosemide (LASIX) infusion 15 mg/hr (02/25/15 0000)  . milrinone 0.25 mcg/kg/min (02/25/15 0000)    PRN Medications: sodium chloride, acetaminophen, acetaminophen, dextrose, ondansetron (ZOFRAN) IV, sodium chloride, tiZANidine, traMADol   Assessment/Plan    1. Acute/Chonic Systolic Heart Failure: In past, suspected nonischemic cardiomyopathy => cardiogenic shock, now requiring milrinone. Volume status remains elevated. CVP 20. Co-ox good at 70%. - Continue Lasix gtt @ 15 and will give metolazone 2.5 mg x 1.  This is same regimen as yesterday, will not be more aggressive given need for peripheral angiography tomorrow.  - Continue milrinone 0.25 mcg/kg/min.  Will need home milrinone - Hold off on ACE, spiro and b-blocker due to shock and recent renal failure.  - Continue hydralazine/nitrates, can transition to Bidil for discharge.  - If renal function remains stable will consider coronary angiography in the near future as we have never ruled out CAD/ischemic cardiomyopathy and he has significant PAD.  - Likely not candidate for LVAD or transplant due to limited mobility with legs 2. PAD: Ischemic right foot. VVS following. Peripheral arterial dopplers suggest possible aortoiliac occlusive disease and a stenosis in the right mid SFA.  - Patient now agreeable to peripheral arteriogram, hopefully can use primarily CO2 given elevated creatinine. Seen by Dr Edilia Bo, hopefully procedure tomorrow. 3. CKD, stage III: Creatinine up to 1.88 with stable BUN.  Remains markedly volume overloaded, will not hold diuresis but will not be more aggressive until after peripheral arteriogram.  4. 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  5. DM II: Adjust per Diabetes coordinator. Lantus stopped due to low CGs 6. Anemia, iron-deficiency 7. Tobacco use: Smoked  over 30 years. Continues to smoke 1/2 ppd, recommended that he quit.  8. PNA: on levofloxacin  Azia Toutant,MD 9:53 AM Advanced Heart Failure Team Pager [phone] (M-F; 7a - 4p)  Please contact [facility] for night-coverage after hours (4p -7a ) and weekends on [email or web address]

## 2015-02-25 NOTE — Progress Notes (Signed)
Per Dr Alford Highland request I have monitored patients output closely. Out put for 7a-7p 500. Will make oncoming nurse aware to monitor out put closely.

## 2015-02-26 ENCOUNTER — Encounter (HOSPITAL_COMMUNITY): Payer: Self-pay | Admitting: Vascular Surgery

## 2015-02-26 ENCOUNTER — Encounter (HOSPITAL_COMMUNITY)
Admission: AD | Disposition: A | Discharge status: Home Health | Payer: Self-pay | Source: Ambulatory Visit | Attending: Internal Medicine

## 2015-02-26 HISTORY — PX: PERIPHERAL VASCULAR CATHETERIZATION: SHX172C

## 2015-02-26 LAB — BASIC METABOLIC PANEL
Anion gap: 11 (ref 5–15)
Anion gap: 10 (ref 5–15)
BUN: 52 mg/dL — ABNORMAL HIGH (ref 6–20)
BUN: 49 mg/dL — AB (ref 6–20)
CALCIUM: 8.9 mg/dL (ref 8.9–10.3)
CALCIUM: 8.6 mg/dL — AB (ref 8.9–10.3)
CO2: 31 mmol/L (ref 22–32)
CO2: 31 mmol/L (ref 22–32)
CREATININE: 2.12 mg/dL — AB (ref 0.61–1.24)
CREATININE: 2.54 mg/dL — AB (ref 0.61–1.24)
Chloride: 87 mmol/L — ABNORMAL LOW (ref 101–111)
Chloride: 84 mmol/L — ABNORMAL LOW (ref 101–111)
GFR calc non Af Amer: 34 mL/min — ABNORMAL LOW (ref >60)
GFR calc non Af Amer: 27 mL/min — ABNORMAL LOW (ref >60)
GFR, EST AFRICAN AMERICAN: 39 mL/min — AB (ref >60)
GFR, EST AFRICAN AMERICAN: 32 mL/min — AB (ref >60)
GLUCOSE: 297 mg/dL — AB (ref 65–99)
Glucose, Bld: 414 mg/dL — ABNORMAL HIGH (ref 65–99)
Potassium: 3.6 mmol/L (ref 3.5–5.1)
Potassium: 3.4 mmol/L — ABNORMAL LOW (ref 3.5–5.1)
SODIUM: 125 mmol/L — AB (ref 135–145)
Sodium: 129 mmol/L — ABNORMAL LOW (ref 135–145)

## 2015-02-26 LAB — CARBOXYHEMOGLOBIN
CARBOXYHEMOGLOBIN: 2.2 % — AB (ref 0.5–1.5)
Methemoglobin: 1.1 % (ref 0.0–1.5)
O2 SAT: 79.3 %
Total hemoglobin: 9 g/dL — ABNORMAL LOW (ref 13.5–18.0)

## 2015-02-26 LAB — POCT ACTIVATED CLOTTING TIME
ACTIVATED CLOTTING TIME: 233 s
ACTIVATED CLOTTING TIME: 208 s
ACTIVATED CLOTTING TIME: 159 s

## 2015-02-26 LAB — CBC
HCT: 26.9 % — ABNORMAL LOW (ref 39.0–52.0)
Hemoglobin: 8.4 g/dL — ABNORMAL LOW (ref 13.0–17.0)
MCH: 22.1 pg — AB (ref 26.0–34.0)
MCHC: 31.2 g/dL (ref 30.0–36.0)
MCV: 70.8 fL — ABNORMAL LOW (ref 78.0–100.0)
PLATELETS: 215 10*3/uL (ref 150–400)
RBC: 3.8 MIL/uL — ABNORMAL LOW (ref 4.22–5.81)
RDW: 19.3 % — ABNORMAL HIGH (ref 11.5–15.5)
WBC: 7.2 10*3/uL (ref 4.0–10.5)

## 2015-02-26 LAB — MAGNESIUM: Magnesium: 1.8 mg/dL (ref 1.7–2.4)

## 2015-02-26 LAB — GLUCOSE, CAPILLARY
GLUCOSE-CAPILLARY: 282 mg/dL — AB (ref 65–99)
GLUCOSE-CAPILLARY: 298 mg/dL — AB (ref 65–99)
Glucose-Capillary: 254 mg/dL — ABNORMAL HIGH (ref 65–99)
Glucose-Capillary: 322 mg/dL — ABNORMAL HIGH (ref 65–99)

## 2015-02-26 SURGERY — ABDOMINAL AORTOGRAM
Anesthesia: LOCAL

## 2015-02-26 MED ORDER — LEVOFLOXACIN IN D5W 750 MG/150ML IV SOLN
750.0000 mg | INTRAVENOUS | Status: DC
  Administered 2015-02-27: 750 mg via INTRAVENOUS
  Filled 2015-02-26: qty 150

## 2015-02-26 MED ORDER — BISACODYL 5 MG PO TBEC: 5.0000 mg | DELAYED_RELEASE_TABLET | Freq: Every day | ORAL | Status: DC | PRN

## 2015-02-26 MED ORDER — SODIUM CHLORIDE 0.9 % IV SOLN: 1.0000 mL/kg/h | INTRAVENOUS | Status: AC

## 2015-02-26 MED ORDER — FENTANYL CITRATE (PF) 100 MCG/2ML IJ SOLN
INTRAMUSCULAR | Status: DC | PRN
  Administered 2015-02-26 (×2): 50 ug via INTRAVENOUS

## 2015-02-26 MED ORDER — LIDOCAINE HCL (PF) 1 % IJ SOLN
INTRAMUSCULAR | Status: AC
  Filled 2015-02-26: qty 30

## 2015-02-26 MED ORDER — MIDAZOLAM HCL 2 MG/2ML IJ SOLN
INTRAMUSCULAR | Status: AC
  Filled 2015-02-26: qty 4

## 2015-02-26 MED ORDER — MAGNESIUM OXIDE 400 (241.3 MG) MG PO TABS
400.0000 mg | ORAL_TABLET | Freq: Two times a day (BID) | ORAL | Status: DC
Start: 2015-02-26 — End: 2015-03-01
  Administered 2015-02-26 – 2015-03-01 (×6): 400 mg via ORAL
  Filled 2015-02-26 (×5): qty 1

## 2015-02-26 MED ORDER — INSULIN GLARGINE 100 UNIT/ML ~~LOC~~ SOLN
18.0000 [IU] | Freq: Every day | SUBCUTANEOUS | Status: DC
  Administered 2015-02-26: 18 [IU] via SUBCUTANEOUS
  Filled 2015-02-26: qty 0.18

## 2015-02-26 MED ORDER — ALBUTEROL SULFATE (2.5 MG/3ML) 0.083% IN NEBU: 3.0000 mL | INHALATION_SOLUTION | Freq: Four times a day (QID) | RESPIRATORY_TRACT | Status: DC | PRN

## 2015-02-26 MED ORDER — SIMVASTATIN 10 MG PO TABS
10.0000 mg | ORAL_TABLET | Freq: Every day | ORAL | Status: DC
  Administered 2015-02-26 – 2015-02-28 (×3): 10 mg via ORAL
  Filled 2015-02-26 (×4): qty 1

## 2015-02-26 MED ORDER — DIGOXIN 0.0625 MG HALF TABLET
0.0625 mg | ORAL_TABLET | Freq: Every day | ORAL | Status: DC
  Administered 2015-02-27 – 2015-03-01 (×3): 0.0625 mg via ORAL
  Filled 2015-02-26 (×3): qty 1

## 2015-02-26 MED ORDER — TORSEMIDE 20 MG PO TABS
40.0000 mg | ORAL_TABLET | Freq: Every day | ORAL | Status: DC
  Administered 2015-02-27: 40 mg via ORAL
  Filled 2015-02-26: qty 2

## 2015-02-26 MED ORDER — CLOPIDOGREL BISULFATE 75 MG PO TABS
150.0000 mg | ORAL_TABLET | Freq: Once | ORAL | Status: AC
  Administered 2015-02-26: 150 mg via ORAL
  Filled 2015-02-26: qty 2

## 2015-02-26 MED ORDER — LIDOCAINE HCL (PF) 1 % IJ SOLN
INTRAMUSCULAR | Status: DC | PRN
  Administered 2015-02-26: 08:00:00

## 2015-02-26 MED ORDER — ZOLPIDEM TARTRATE 5 MG PO TABS
5.0000 mg | ORAL_TABLET | Freq: Every evening | ORAL | Status: DC | PRN
  Filled 2015-02-26: qty 1

## 2015-02-26 MED ORDER — DIGOXIN 125 MCG PO TABS: 125.0000 ug | ORAL_TABLET | Freq: Every day | ORAL | Status: DC

## 2015-02-26 MED ORDER — HEPARIN SODIUM (PORCINE) 1000 UNIT/ML IJ SOLN
INTRAMUSCULAR | Status: AC
  Filled 2015-02-26: qty 1

## 2015-02-26 MED ORDER — BUPROPION HCL ER (SR) 150 MG PO TB12
150.0000 mg | ORAL_TABLET | Freq: Two times a day (BID) | ORAL | Status: DC
  Administered 2015-02-26 – 2015-03-01 (×6): 150 mg via ORAL
  Filled 2015-02-26 (×5): qty 1

## 2015-02-26 MED ORDER — OXYCODONE HCL 5 MG PO TABS: 5.0000 mg | ORAL_TABLET | Freq: Four times a day (QID) | ORAL | Status: DC | PRN

## 2015-02-26 MED ORDER — HEPARIN (PORCINE) IN NACL 2-0.9 UNIT/ML-% IJ SOLN
INTRAMUSCULAR | Status: AC
  Filled 2015-02-26: qty 1000

## 2015-02-26 MED ORDER — MIDAZOLAM HCL 2 MG/2ML IJ SOLN
INTRAMUSCULAR | Status: DC | PRN
  Administered 2015-02-26 (×2): 1 mg via INTRAVENOUS

## 2015-02-26 MED ORDER — FENTANYL CITRATE (PF) 100 MCG/2ML IJ SOLN
INTRAMUSCULAR | Status: AC
  Filled 2015-02-26: qty 4

## 2015-02-26 MED ORDER — HEPARIN SODIUM (PORCINE) 1000 UNIT/ML IJ SOLN
INTRAMUSCULAR | Status: DC | PRN
  Administered 2015-02-26: 7000 [IU] via INTRAVENOUS

## 2015-02-26 MED ORDER — POTASSIUM CHLORIDE CRYS ER 20 MEQ PO TBCR
40.0000 meq | EXTENDED_RELEASE_TABLET | Freq: Once | ORAL | Status: AC
  Administered 2015-02-26: 40 meq via ORAL

## 2015-02-26 SURGICAL SUPPLY — 20 items
CATH ANGIO 5F PIGTAIL 65CM (CATHETERS) ×1 IMPLANT
COVER PRB 48X5XTLSCP FOLD TPE (BAG) IMPLANT
COVER PROBE 5X48 (BAG) ×2
DEVICE CONTINUOUS FLUSH (MISCELLANEOUS) ×1 IMPLANT
FILTER CO2 0.2 MICRON (VASCULAR PRODUCTS) ×1 IMPLANT
KIT ENCORE 26 ADVANTAGE (KITS) ×1 IMPLANT
KIT MICROINTRODUCER STIFF 5F (SHEATH) ×1 IMPLANT
KIT PV (KITS) ×2 IMPLANT
RESERVOIR CO2 (VASCULAR PRODUCTS) ×1 IMPLANT
SET FLUSH CO2 (MISCELLANEOUS) ×1 IMPLANT
SHEATH BRITE TIP 7FR 35CM (SHEATH) ×1 IMPLANT
SHEATH PINNACLE 5F 10CM (SHEATH) ×1 IMPLANT
STENT GENESIS OPTA 7X24X80 (Permanent Stent) ×1 IMPLANT
STENT SMART CONTROL 8X40X120 (Permanent Stent) ×1 IMPLANT
SYR MEDRAD MARK V 150ML (SYRINGE) ×2 IMPLANT
TRANSDUCER W/STOPCOCK (MISCELLANEOUS) ×2 IMPLANT
TRAY PV CATH (CUSTOM PROCEDURE TRAY) ×2 IMPLANT
WIRE HITORQ VERSACORE ST 145CM (WIRE) ×1 IMPLANT
WIRE ROSEN-J .035X180CM (WIRE) ×1 IMPLANT
WIRE ROSEN-J .035X260CM (WIRE) ×1 IMPLANT

## 2015-02-26 NOTE — Op Note (Signed)
PATIENT: Christian Wall   MRN: [id number] DOB: Dec 18, 1960    DATE OF PROCEDURE: 02/26/2015  INDICATIONS: Christian Wall is a 54 y.o. male with a nonhealing wound of his right foot he had evidence of multilevel arterial occlusive disease and arteriography and possible intervention was recommended. He has chronic kidney disease and his renal function has actually worsened. I therefore elected to the study entirely using CO2. No contrast was used.  PROCEDURE:  1. Ultrasound-guided access to the right common femoral artery 2. Aortogram with bilateral iliac arteriogram 3. Angioplasty and stenting of 2 common iliac artery stenoses ( 7 mm x 24 mm balloon expandable stent and 8 mm x 40 mm self-expanding stent) 4. Right femoral arteriogram with right lower extremity runoff using CO2  SURGEON: Judeth Cornfield. Scot Dock, MD, FACS  ANESTHESIA: local with sedation   EBL: minimal  TECHNIQUE: The patient was taken to the peripheral vascular lab and sedated with 1 mg of Versed and 50 g of fentanyl. I elected to cannulate the right side so that we could address any inflow disease on the right. After the skin was anesthetized, under ultrasound guidance, the right common femoral artery was cannulated with a micro-puncture needle and a micropuncture sheath introduced over a wire. The patient has a contracture in his right knee making access challenge and on the right. The micropuncture sheath was then exchanged for a 5 French sheath over a versa core wire. A pigtail catheter was positioned at the L1 vertebral body and flush aortogram obtained. The entire study was done using CO2. No contrast was used. The catheter was then pulled down above the bifurcation and an oblique iliac projection was obtained. There were 2 stenoses in the common iliac artery on the right. At the origin that was a 70% stenosis. In the mid segment of the common iliac artery that was an 85% stenosis. I elected to address these with balloon  angioplasty and stenting.  The patient received 7000 units of IV heparin. ACT was 233 after this had circulated. The 5 French sheath had been exchanged for a long bright tip 7 [place] sheath. To address the proximal common iliac artery stenosis I selected a 7 mm x 24 mm balloon expandable stent. This was positioned across the stenosis at the aortic bifurcation and inflated to 10 atm for 1 minute. Completion film showed a good result proximally but low the stent was difficult to visualize. I therefore elected to overlap a second stent to address the more distal lesion. I selected an 8 mm x 40 mm self-expanding stent which was overlapped into the proximal stent down past the stenosis in the common iliac artery. This was deployed without difficulty. I then went back with a 7 mm x 24 mm balloon which was inflated to full effacement proximally and throughout the 2 stents. Completion film showed an excellent result with no residual stenosis. Next the pigtail catheter was removed over a wire.  Next retrograde right femoral arteriogram was obtained using CO2 and step projections were obtained of the right lower extremity. At the completion of the procedure, the patient was transferred to the holding area for removal of the sheath.  FINDINGS:  1. There are single renal arteries bilaterally with no significant renal artery stenosis identified. The infrarenal aorta is widely patent. On the left side, the common iliac hypogastric and external iliac arteries are patent. 2. On the right side there was a proximal right common iliac artery stenosis and mid segment common iliac artery stenosis  which was successfully ballooned and stented as described above. The external iliac artery and hypogastric arteries were patent. 3. There is moderate diffuse disease throughout the superficial femoral artery on the right and popliteal artery. The anterior tibial artery is occluded. There is two-vessel runoff on the right via the  posterior tibial and peroneal arteries.  PROXIMAL RIGHT COMMON ILIAC ARTERY STENOSIS: PRE: 70%  POST: 0% STENT: 7X24 balloon expandable.  MID SEGMENT RIGHT COMMON ILIAC ARTERY STENOSIS: PRE: 85%  POST: 0% STENT: 8X40 self expanding stent.   Deitra Mayo, MD, FACS [facility]  DATE OF DICTATION:   02/26/2015

## 2015-02-26 NOTE — Progress Notes (Signed)
Patient ID: Christian Wall, male   DOB: July 28, 1960, 54 y.o.   MRN: [id number] Advanced Heart Failure Rounding Note   Subjective:     Admitted from HF clinic with suspected ischemic foot and low output. CO-OX on admit 43%. VVS consulted. ABI -Duplex suggests possibly aortoiliac occlusive disease and a stenosis in the right mid SFA.    Remains on levaquin for PNA. Now on milrinone + lasix gtt at 15 mg per hour. CO-OX 79%. I/Os and weights inaccurate (weighed in bed today). CVP still 16-18.  Had CO2 angiogram earlier today.     Creatinine 1.8>1.5 > 1.6 > 1.88>2.1 CO-OX 65% -> 72% -> 70%-->79%    Objective:   Weight Range:  Vital Signs:   Temp:  [97.5 F (36.4 C)-98.1 F (36.7 C)] 97.9 F (36.6 C) (08/22 1143) Pulse Rate:  [96-104] 102 (08/22 1145) Resp:  [13-21] 18 (08/22 1145) BP: (99-122)/(51-75) 100/62 mmHg (08/22 1145) SpO2:  [88 %-100 %] 100 % (08/22 1145) Weight:  [204 lb 12.9 oz (92.9 kg)] 204 lb 12.9 oz (92.9 kg) (08/22 0337) Last BM Date: 02/19/15  Weight change: Filed Weights   02/25/15 0651 02/26/15 0333 02/26/15 0337  Weight: 211 lb 13.8 oz (96.1 kg) 204 lb 12.9 oz (92.9 kg) 204 lb 12.9 oz (92.9 kg)    Intake/Output:   Intake/Output Summary (Last 24 hours) at 02/26/15 1452 Last data filed at 02/26/15 1400  Gross per 24 hour  Intake   1175 ml  Output   2800 ml  Net  -1625 ml     Physical Exam: CVP 16-17  General: . No resp difficulty.In bed.   HEENT: normal Neck: supple. JVP to jaw . Carotids 2+ bilaterally; no bruits. No lymphadenopathy or thryomegaly appreciated. Cor: PMI laterally displaced. Regular, mildly tachycardic,+S3, 2/6 MR Lungs: Rhonchi throughout Abdomen: soft, obese nontender, nondistended. No hepatosplenomegaly. No bruits or masses. Good bowel sounds. Extremities: very cool. No clubbing, rash, edema. RUE PICC. Areas of necrosis and skin sloughing on dorsum of both feet R>>L. No palpable pulses .  Neuro: alert & orientedx3, cranial  nerves grossly intact. Moves all 4 extremities w/o difficulty. Weak in bilateral legs. Affect pleasant.  Telemetry: ST 100-110   Labs: Basic Metabolic Panel:  Recent Labs Lab 02/21/15 1415 02/22/15 0447 02/23/15 0325 02/24/15 0500 02/25/15 0439 02/26/15 0339  NA 134* 136 136 134* 131* 129*  K 4.0 3.8 3.8 4.8 4.4 3.6  CL 94* 95* 91* 96* 91* 87*  CO2 30 32 35* GLUCOSE 277* 72 94 261* 243* 297*  BUN 58* 53* 44* 47* 46* 52*  CREATININE 2.04* 1.81* 1.52* 1.60* 1.88* 2.12*  CALCIUM 9.3 9.1 9.0 8.8* 8.9 8.9  MG 1.7  --  2.4  --   --   --     Liver Function Tests:  Recent Labs Lab 02/21/15 1415  AST 38  ALT 35  ALKPHOS 448*  BILITOT 2.0*  PROT 7.0  ALBUMIN 2.8*   No results for input(s): LIPASE, AMYLASE in the last 168 hours. No results for input(s): AMMONIA in the last 168 hours.  CBC:  Recent Labs Lab 02/21/15 1415 02/23/15 0325 02/26/15 0339  WBC 7.6 12.9* 7.2  NEUTROABS 5.6  --   --   HGB 9.9* 9.7* 8.4*  HCT 31.1* 30.9* 26.9*  MCV 71.8* 71.0* 70.8*  PLT 365 346 215    Cardiac Enzymes: No results for input(s): CKTOTAL, CKMB, CKMBINDEX, TROPONINI in the last 168 hours.  BNP: BNP (last 3  results)  Recent Labs  01/30/15 1205 01/30/15 2020 02/21/15 1415  BNP 3105.0* 2790.6* 1554.1*    ProBNP (last 3 results) No results for input(s): PROBNP in the last 8760 hours.    Other results:  Imaging: No results found.   Medications:     Scheduled Medications: . atorvastatin  40 mg Oral q1800  . buPROPion  150 mg Oral BID  . buPROPion  150 mg Oral BID  . [START ON 02/27/2015] digoxin  0.0625 mg Oral Daily  . DULoxetine  30 mg Oral Daily  . enoxaparin (LOVENOX) injection  40 mg Subcutaneous Daily  . hydrALAZINE  75 mg Oral 3 times per day  . insulin aspart  0-9 Units Subcutaneous TID WC  . isosorbide mononitrate  30 mg Oral Daily  . [START ON 02/27/2015] levofloxacin (LEVAQUIN) IV  750 mg Intravenous Q48H  . lipase/protease/amylase  3  capsule Oral TID AC  . magnesium oxide  400 mg Oral BID  . magnesium oxide  400 mg Oral BID  . pantoprazole  80 mg Oral Q1200  . pregabalin  150 mg Oral BID  . simvastatin  10 mg Oral QHS  . sodium chloride  3 mL Intravenous Q12H    Infusions: . furosemide (LASIX) infusion 15 mg/hr (02/26/15 0700)  . milrinone 0.25 mcg/kg/min (02/26/15 0700)    PRN Medications: sodium chloride, acetaminophen, acetaminophen, albuterol, bisacodyl, dextrose, ondansetron (ZOFRAN) IV, oxyCODONE, sodium chloride, tiZANidine, traMADol   Assessment/Plan    1. Acute/Chonic Systolic Heart Failure: In past, suspected nonischemic cardiomyopathy => cardiogenic shock, now requiring milrinone. Volume status remains elevated. CVP 18-19. CO-OX stable 79%. Hold lasix drip today. Creatinine trending up. Start torsemide tomorrow.  - Continue milrinone 0.25 mcg/kg/min.  Will need home milrinone. [facility] aware and following.  - Hold off on ACE, spiro and b-blocker due to shock and recent renal failure.  - Continue hydralazine/nitrates, can transition to Bidil for discharge but cost is an issue.   - If renal function remains stable will consider coronary angiography in the near future as we have never ruled out CAD/ischemic cardiomyopathy and he has significant PAD.  - Likely not candidate for LVAD or transplant due to limited mobility with legs 2. PAD: Ischemic right foot. VVS following. Peripheral arterial dopplers suggest possible aortoiliac occlusive disease and a stenosis in the right mid SFA.  - S/P Arteriogram wit stent x 2 R common iliac artery. No dye yesed 3. CKD, stage III: Creatinine trending up 1.8>2.1. Hold diuretics today.  4. 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  5. DM II: Adjust per Diabetes coordinator. Lantus stopped due to low CBGs but having elevated glucose again. Will add 18 units Lantus at bed time.  6. Anemia, iron-deficiency 7.  Tobacco use: Smoked over 30 years. Continues to smoke 1/2 ppd, recommended that he quit.  8. PNA: Started levofloxacin 8/19.  9.Hyponatremia- Na 129   Disposition [facility] to follow when discharged for home milrinone + diuretics protocol.   CLEGG,AMY,NP-C  2:52 PM Advanced Heart Failure Team Pager [phone] (M-F; 7a - 4p)  Please contact [facility] for night-coverage after hours (4p -7a ) and weekends on [email or web address]   Patient seen and examined with [name], NP. We discussed all aspects of the encounter. I agree with the assessment and plan as stated above.   He is s/p stenting of R common iliac today. Foot is warmer. Hopefully will assist in wound healing. Remains volume overloaded but creatinine going up with  attempts at diuresis despite milrinone support. Will hold diuretics today and watch renal function. May do better with slower diuresis with oral torsemide. Will need home milrinone.  Brenson Hartman,MD

## 2015-02-26 NOTE — Progress Notes (Signed)
35fr long sheath aspirated and removed from rfa. Manual pressure applied for 20 minutes. No s+s of hematoma. Tegaderm dressing applied. Bedrest instructions given.   Bedrest begins at 10:35:00am

## 2015-02-26 NOTE — H&P (View-Only) (Signed)
   VASCULAR SURGERY ASSESSMENT & PLAN:  * The patient is now agreeable to proceed with arteriography tomorrow.  *  His creatinine was 1.88 yesterday which is up slightly. We will need to continue gentle hydration in anticipation of his arteriogram tomorrow. I may need to do the study with mostly CO2 given his renal insufficiency.  SUBJECTIVE: No specific complaints.  PHYSICAL EXAM: Filed Vitals:   02/25/15 0000 02/25/15 0005 02/25/15 0400 02/25/15 0651  BP:  113/54 108/59 127/65  Pulse: 103 103 104 101  Temp:  97.8 F (36.6 C)  97.6 F (36.4 C)  TempSrc:  Axillary  Oral  Resp: 16 13 17 16   Height:      Weight:    211 lb 13.8 oz (96.1 kg)  SpO2: 95% 95% 93% 94%   Right foot unchanged.  LABS: Lab Results  Component Value Date   WBC 12.9* 02/23/2015   HGB 9.7* 02/23/2015   HCT 30.9* 02/23/2015   MCV 71.0* 02/23/2015   PLT 346 02/23/2015   Lab Results  Component Value Date   CREATININE 1.88* 02/25/2015   CBG (last 3)   Recent Labs  02/24/15 1111 02/24/15 1744 02/24/15 2110  GLUCAP 370* 210* 199*    Active Problems:   Ischemic neuropathy of foot   Shock, cardiogenic   Lower limb ischemia   Hospital acquired PNA   Acute on chronic systolic heart failure   Cari Caraway Beeper: [phone] 02/25/2015

## 2015-02-26 NOTE — Care Management Important Message (Addendum)
Important Message  Patient Details  Name: Christian Wall MRN: [id number] Date of Birth: [birth year]   Medicare Important Message Given:  Yes-second notification given    Hanley Hays, RN 02/26/2015, 9:19 AM

## 2015-02-26 NOTE — Progress Notes (Signed)
[facility]  Patient Status:  New pt for [facility] this admission though we have had Mr. Howes on home milrinone one month ago, in July.   [facility] is providing the following services:  HHRN and Home Inotrope Pharmacy Team for home Milrinone. [facility] team is prepared for pt DC when deemed appropriate.  [facility] IC will connect pt to home Milrinone infusion prior to DC once orders obtained.    If patient discharges after hours, please call [phone].   Sedalia Muta 02/26/2015, 10:00 PM

## 2015-02-26 NOTE — Interval H&P Note (Signed)
History and Physical Interval Note:  02/26/2015 7:19 AM  Christian Wall  has presented today for surgery, with the diagnosis of Ischemic Right Leg  The various methods of treatment have been discussed with the patient and family. After consideration of risks, benefits and other options for treatment, the patient has consented to  Procedure(s): Abdominal Aortogram (N/A) as a surgical intervention .  The patient's history has been reviewed, patient examined, no change in status, stable for surgery.  I have reviewed the patient's chart and labs.  Questions were answered to the patient's satisfaction.     Waverly Ferrari

## 2015-02-26 NOTE — Progress Notes (Signed)
Inpatient Diabetes Program Recommendations  AACE/ADA: New Consensus Statement on Inpatient Glycemic Control (2013)  Target Ranges:  Prepandial:   less than 140 mg/dL      Peak postprandial:   less than 180 mg/dL (1-2 hours)      Critically ill patients:  140 - 180 mg/dL  Results for TIPTON, GLAZENER (MRN [id number]) as of 02/26/2015 08:39  Ref. Range 02/26/2015 03:39  Glucose Latest Ref Range: 65-99 mg/dL 782 (H)   Results for SHADRACH, GHAFOOR (MRN [id number]) as of 02/26/2015 08:39  Ref. Range 02/25/2015 07:23 02/25/2015 11:08 02/25/2015 16:11 02/25/2015 20:30  Glucose-Capillary Latest Ref Range: 65-99 mg/dL 578 (H) 469 (H) 629 (H) 168 (H)    Diabetes history:DM2 Outpatient Diabetes medications: Lantus 30 units QAM, Lantus 50 units QPM, Novolog 0-15 units TID with meals Current orders for Inpatient glycemic control: Novolog 0-9 units TID with meals  Inpatient Diabetes Program Recommendations Insulin - Basal: Please consider ordering low dose basal insulin; recommend ordering Lantus 18 units QHS (based on 92.9 kg x 0.2 units). Correction (SSI): Please consider adding Novolog bedtime correction scale.  Thanks, Orlando Penner, RN, MSN, CCRN, CDE Diabetes Coordinator Inpatient Diabetes Program [phone] (Team Pager from 8am to 5pm) [phone] ([place] office) [phone] [facility] office) [phone] [facility] office)

## 2015-02-27 ENCOUNTER — Other Ambulatory Visit: Payer: Self-pay | Admitting: *Deleted

## 2015-02-27 ENCOUNTER — Inpatient Hospital Stay (HOSPITAL_COMMUNITY): Payer: Medicare Other

## 2015-02-27 DIAGNOSIS — I739 Peripheral vascular disease, unspecified: Secondary | ICD-10-CM

## 2015-02-27 DIAGNOSIS — I509 Heart failure, unspecified: Secondary | ICD-10-CM

## 2015-02-27 DIAGNOSIS — Z9862 Peripheral vascular angioplasty status: Secondary | ICD-10-CM

## 2015-02-27 LAB — BASIC METABOLIC PANEL
ANION GAP: 9 (ref 5–15)
Anion gap: 5 (ref 5–15)
BUN: 52 mg/dL — ABNORMAL HIGH (ref 6–20)
BUN: 54 mg/dL — AB (ref 6–20)
CALCIUM: 8.6 mg/dL — AB (ref 8.9–10.3)
CALCIUM: 8.5 mg/dL — AB (ref 8.9–10.3)
CO2: 31 mmol/L (ref 22–32)
CO2: 30 mmol/L (ref 22–32)
Chloride: 85 mmol/L — ABNORMAL LOW (ref 101–111)
Chloride: 86 mmol/L — ABNORMAL LOW (ref 101–111)
Creatinine, Ser: 2.6 mg/dL — ABNORMAL HIGH (ref 0.61–1.24)
Creatinine, Ser: 2.96 mg/dL — ABNORMAL HIGH (ref 0.61–1.24)
GFR calc Af Amer: 26 mL/min — ABNORMAL LOW (ref >60)
GFR, EST AFRICAN AMERICAN: 31 mL/min — AB (ref >60)
GFR, EST NON AFRICAN AMERICAN: 26 mL/min — AB (ref >60)
GFR, EST NON AFRICAN AMERICAN: 23 mL/min — AB (ref >60)
GLUCOSE: 350 mg/dL — AB (ref 65–99)
Glucose, Bld: 339 mg/dL — ABNORMAL HIGH (ref 65–99)
POTASSIUM: 3.8 mmol/L (ref 3.5–5.1)
POTASSIUM: 3.5 mmol/L (ref 3.5–5.1)
SODIUM: 125 mmol/L — AB (ref 135–145)
Sodium: 121 mmol/L — ABNORMAL LOW (ref 135–145)

## 2015-02-27 LAB — URINE CULTURE
Culture: >=100000
SPECIAL REQUESTS: NORMAL

## 2015-02-27 LAB — GLUCOSE, CAPILLARY
GLUCOSE-CAPILLARY: 291 mg/dL — AB (ref 65–99)
GLUCOSE-CAPILLARY: 112 mg/dL — AB (ref 65–99)
Glucose-Capillary: 323 mg/dL — ABNORMAL HIGH (ref 65–99)
Glucose-Capillary: 176 mg/dL — ABNORMAL HIGH (ref 65–99)

## 2015-02-27 LAB — CARBOXYHEMOGLOBIN
CARBOXYHEMOGLOBIN: 2 % — AB (ref 0.5–1.5)
METHEMOGLOBIN: 0.7 % (ref 0.0–1.5)
O2 Saturation: 60.6 %
Total hemoglobin: 9 g/dL — ABNORMAL LOW (ref 13.5–18.0)

## 2015-02-27 MED ORDER — SODIUM CHLORIDE 0.9 % IV BOLUS (SEPSIS)
250.0000 mL | Freq: Once | INTRAVENOUS | Status: AC
  Administered 2015-02-27: 250 mL via INTRAVENOUS

## 2015-02-27 MED ORDER — CLOPIDOGREL BISULFATE 75 MG PO TABS
75.0000 mg | ORAL_TABLET | Freq: Every day | ORAL | Status: DC
  Administered 2015-02-27 – 2015-03-01 (×3): 75 mg via ORAL
  Filled 2015-02-27 (×3): qty 1

## 2015-02-27 MED ORDER — CEFTRIAXONE SODIUM 1 G IJ SOLR
1.0000 g | Freq: Every day | INTRAMUSCULAR | Status: DC
  Administered 2015-02-27 – 2015-03-01 (×3): 1 g via INTRAVENOUS
  Filled 2015-02-27 (×3): qty 10

## 2015-02-27 MED ORDER — FUROSEMIDE 10 MG/ML IJ SOLN: 40.0000 mg | Freq: Once | INTRAMUSCULAR | Status: DC

## 2015-02-27 MED ORDER — INSULIN GLARGINE 100 UNIT/ML ~~LOC~~ SOLN
10.0000 [IU] | Freq: Once | SUBCUTANEOUS | Status: AC
  Administered 2015-02-27: 10 [IU] via SUBCUTANEOUS
  Filled 2015-02-27: qty 0.1

## 2015-02-27 MED ORDER — INSULIN ASPART 100 UNIT/ML ~~LOC~~ SOLN
0.0000 [IU] | Freq: Every day | SUBCUTANEOUS | Status: DC
  Administered 2015-02-28: 2 [IU] via SUBCUTANEOUS

## 2015-02-27 MED ORDER — INSULIN GLARGINE 100 UNIT/ML ~~LOC~~ SOLN
25.0000 [IU] | Freq: Every day | SUBCUTANEOUS | Status: DC
  Administered 2015-02-27 – 2015-02-28 (×2): 25 [IU] via SUBCUTANEOUS
  Filled 2015-02-27 (×3): qty 0.25

## 2015-02-27 MED ORDER — INSULIN ASPART 100 UNIT/ML ~~LOC~~ SOLN
0.0000 [IU] | Freq: Three times a day (TID) | SUBCUTANEOUS | Status: DC
  Administered 2015-02-27: 3 [IU] via SUBCUTANEOUS
  Administered 2015-02-27: 8 [IU] via SUBCUTANEOUS
  Administered 2015-02-27: 11 [IU] via SUBCUTANEOUS
  Administered 2015-02-28: 5 [IU] via SUBCUTANEOUS
  Administered 2015-02-28 (×2): 3 [IU] via SUBCUTANEOUS
  Administered 2015-03-01: 5 [IU] via SUBCUTANEOUS
  Administered 2015-03-01: 2 [IU] via SUBCUTANEOUS

## 2015-02-27 MED ORDER — DOPAMINE-DEXTROSE 3.2-5 MG/ML-% IV SOLN
3.0000 ug/kg/min | INTRAVENOUS | Status: DC
  Administered 2015-02-27: 3 ug/kg/min via INTRAVENOUS
  Filled 2015-02-27: qty 250

## 2015-02-27 MED ORDER — ALUM & MAG HYDROXIDE-SIMETH 200-200-20 MG/5ML PO SUSP
15.0000 mL | ORAL | Status: DC | PRN
  Administered 2015-02-27 (×2): 15 mL via ORAL
  Filled 2015-02-27 (×2): qty 30

## 2015-02-27 NOTE — Progress Notes (Signed)
Echocardiogram 2D Echocardiogram has been performed.  Christian Wall 02/27/2015, 4:19 PM

## 2015-02-27 NOTE — Progress Notes (Addendum)
  [facility] Progress Note  Subjective  - No complaints.   Objective Filed Vitals:   02/27/15 0752  BP: 104/63  Pulse: 104  Temp: 97.6 F (36.4 C)  Resp: 17    Intake/Output Summary (Last 24 hours) at 02/27/15 1037 Last data filed at 02/27/15 1000  Gross per 24 hour  Intake 1605.7 ml  Output    825 ml  Net  780.7 ml    Right groin soft without hematoma Brisk right peroneal and posterior tibial doppler signals, soft right anterior tibial doppler signal Right foot wound stable and clean.   Assessment/Planning: 54 y.o. male is s/p:  1. Ultrasound-guided access to the right common femoral artery 2. Aortogram with bilateral iliac arteriogram 3. Angioplasty and stenting of 2 common iliac artery stenoses ( 7 mm x 24 mm balloon expandable stent and 8 mm x 40 mm self-expanding stent) 4. Right femoral arteriogram with right lower extremity runoff using CO2  1 Day Post-Op   -Successful stenting x 2  of right common iliac stenoses.  -Continue daily dressing changes to right foot.  -Plavix 75mg  daily.  -Right groin without hematoma -Encouraged smoking cessation.  -Follow up in 4 weeks with Dr. Hart [place] and arterial duplex  Raymond Gurney 02/27/2015 10:37 AM --  Laboratory CBC    Component Value Date/Time   WBC 7.2 02/26/2015 0339   HGB 8.4* 02/26/2015 0339   HCT 26.9* 02/26/2015 0339   PLT 215 02/26/2015 0339    BMET    Component Value Date/Time   NA 125* 02/27/2015 0530   K 3.8 02/27/2015 0530   CL 85* 02/27/2015 0530   CO2 31 02/27/2015 0530   GLUCOSE 339* 02/27/2015 0530   BUN 52* 02/27/2015 0530   CREATININE 2.60* 02/27/2015 0530   CALCIUM 8.6* 02/27/2015 0530   CALCIUM 12.5* 04/17/2008 0710   GFRNONAA 26* 02/27/2015 0530   GFRAA 31* 02/27/2015 0530    COAG No results found for: INR, PROTIME No results found for: PTT  Antibiotics Anti-infectives    Start     Dose/Rate Route Frequency Ordered Stop   02/27/15 0900  levofloxacin  (LEVAQUIN) IVPB 750 mg     750 mg 100 mL/hr over 90 Minutes Intravenous Every 48 hours 02/26/15 1034     02/23/15 0900  levofloxacin (LEVAQUIN) IVPB 750 mg  Status:  Discontinued     750 mg 100 mL/hr over 90 Minutes Intravenous Every 24 hours 02/23/15 0811 02/26/15 1034       Maris Berger, PA-C [facility] Office: [phone] Pager: [phone] 02/27/2015 10:37 AM

## 2015-02-27 NOTE — Progress Notes (Signed)
Inpatient Diabetes Program Recommendations  AACE/ADA: New Consensus Statement on Inpatient Glycemic Control (2013)  Target Ranges:  Prepandial:   less than 140 mg/dL      Peak postprandial:   less than 180 mg/dL (1-2 hours)      Critically ill patients:  140 - 180 mg/dL   Results for Christian Wall, Christian Wall (MRN [id number]) as of 02/27/2015 08:26  Ref. Range 02/26/2015 09:42 02/26/2015 11:51 02/26/2015 16:23 02/26/2015 21:21 02/27/2015 07:49  Glucose-Capillary Latest Ref Range: 65-99 mg/dL 160 (H) 737 (H) 106 (H) 298 (H) 323 (H)   Diabetes history:DM2 Outpatient Diabetes medications: Lantus 30 units QAM, Lantus 50 units QPM, Novolog 0-15 units TID with meals Current orders for Inpatient glycemic control: Lantus 25 units QHS, Novolog 015 units TID with meals, Novolog 0-5 units HS  Inpatient Diabetes Program Recommendations Insulin - Basal: Noted Lantus was increased from 18 units QHS to 25 units QHS and one time order for Lantus 10 units x1 which is ordered to be given at 10 am today. Insulin - Meal Coverage: Please consider ordering Novolog 6 units TID with meals for meal coverage (in addition to correction scale).  Thanks, Orlando Penner, RN, MSN, CCRN, CDE Diabetes Coordinator Inpatient Diabetes Program [phone] (Team Pager from 8am to 5pm) [phone] ([place] office) [phone] [facility] office) [phone] [facility] office)

## 2015-02-27 NOTE — Progress Notes (Addendum)
Patient has not made any urine for the past 8 hours. [name], NP aware and at bedside. Bladder scan completed showing only 129 mls in bladder. Will continue to monitor.   Update 1900: Patient put out 150 cc's of dark, amber urine this shift. Dr. Gala Romney aware, orders for 250 cc bolus.   Christian Wall

## 2015-02-27 NOTE — Progress Notes (Addendum)
Patient ID: Christian Wall, male   DOB: Aug 02, 1960, 54 y.o.   MRN: [id number] Advanced Heart Failure Rounding Note   Subjective:     Admitted from HF clinic with suspected ischemic foot and low output. CO-OX on admit 43%. [facility] consulted. ABI -Duplex suggests possibly aortoiliac occlusive disease and a stenosis in the right mid SFA.     Events 8/22 CO2 angiogram with 2 stents.    Remains on levaquin for PNA. On Milrinone 0.25 mcg.  Off lasix drip. CO-OX 61%. CVP 20. Weight up 1 pound.  Denies SOB     Creatinine  2.5> 2.6  CO-OX 61    Objective:   Weight Range:  Vital Signs:   Temp:  [97.4 F (36.3 C)-98.4 F (36.9 C)] 98.4 F (36.9 C) (08/23 0405) Pulse Rate:  [97-105] 103 (08/23 0405) Resp:  [10-21] 12 (08/23 0405) BP: (96-114)/(56-68) 107/59 mmHg (08/23 0551) SpO2:  [88 %-100 %] 93 % (08/23 0405) Weight:  [205 lb 0.4 oz (93 kg)] 205 lb 0.4 oz (93 kg) (08/23 0405) Last BM Date: 02/19/15  Weight change: Filed Weights   02/26/15 0333 02/26/15 0337 02/27/15 0405  Weight: 204 lb 12.9 oz (92.9 kg) 204 lb 12.9 oz (92.9 kg) 205 lb 0.4 oz (93 kg)    Intake/Output:   Intake/Output Summary (Last 24 hours) at 02/27/15 0729 Last data filed at 02/27/15 0600  Gross per 24 hour  Intake 1248.2 ml  Output   1025 ml  Net  223.2 ml     Physical Exam: CVP 20  General: . No resp difficulty.In bed.   HEENT: normal Neck: supple. JVP to jaw . Carotids 2+ bilaterally; no bruits. No lymphadenopathy or thryomegaly appreciated. Cor: PMI laterally displaced. Regular, mildly tachycardic,+S3, 2/6 MR Lungs: Rhonchi throughout Abdomen: soft, obese nontender, nondistended. No hepatosplenomegaly. No bruits or masses. Good bowel sounds. Extremities: very cool. No clubbing, rash, edema. RUE PICC. Areas of necrosis and skin sloughing on dorsum of both feet R>>L. No palpable pulses .  Neuro: alert & orientedx3, cranial nerves grossly intact. Moves all 4 extremities w/o difficulty. Weak in  bilateral legs. Affect pleasant.  Telemetry: ST 100-110   Labs: Basic Metabolic Panel:  Recent Labs Lab 02/21/15 1415  02/23/15 0325 02/24/15 0500 02/25/15 0439 02/26/15 0339 02/26/15 2040 02/27/15 0530  NA 134*  < > 136 134* 131* 129* 125* 125*  K 4.0  < > 3.8 4.8 4.4 3.6 3.4* 3.8  CL 94*  < > 91* 96* 91* 87* 84* 85*  CO2 30  < > 35* 30 29 31 31 31   GLUCOSE 277*  < > 94 261* 243* 297* 414* 339*  BUN 58*  < > 44* 47* 46* 52* 49* 52*  CREATININE 2.04*  < > 1.52* 1.60* 1.88* 2.12* 2.54* 2.60*  CALCIUM 9.3  < > 9.0 8.8* 8.9 8.9 8.6* 8.6*  MG 1.7  --  2.4  --   --   --  1.8  --   < > = values in this interval not displayed.  Liver Function Tests:  Recent Labs Lab 02/21/15 1415  AST 38  ALT 35  ALKPHOS 448*  BILITOT 2.0*  PROT 7.0  ALBUMIN 2.8*   No results for input(s): LIPASE, AMYLASE in the last 168 hours. No results for input(s): AMMONIA in the last 168 hours.  CBC:  Recent Labs Lab 02/21/15 1415 02/23/15 0325 02/26/15 0339  WBC 7.6 12.9* 7.2  NEUTROABS 5.6  --   --   HGB 9.9*  9.7* 8.4*  HCT 31.1* 30.9* 26.9*  MCV 71.8* 71.0* 70.8*  PLT 365 346 215    Cardiac Enzymes: No results for input(s): CKTOTAL, CKMB, CKMBINDEX, TROPONINI in the last 168 hours.  BNP: BNP (last 3 results)  Recent Labs  01/30/15 1205 01/30/15 2020 02/21/15 1415  BNP 3105.0* 2790.6* 1554.1*    ProBNP (last 3 results) No results for input(s): PROBNP in the last 8760 hours.    Other results:  Imaging: No results found.   Medications:     Scheduled Medications: . atorvastatin  40 mg Oral q1800  . buPROPion  150 mg Oral BID  . digoxin  0.0625 mg Oral Daily  . DULoxetine  30 mg Oral Daily  . enoxaparin (LOVENOX) injection  40 mg Subcutaneous Daily  . hydrALAZINE  75 mg Oral 3 times per day  . insulin aspart  0-9 Units Subcutaneous TID WC  . insulin glargine  18 Units Subcutaneous QHS  . isosorbide mononitrate  30 mg Oral Daily  . levofloxacin (LEVAQUIN) IV   750 mg Intravenous Q48H  . lipase/protease/amylase  3 capsule Oral TID AC  . magnesium oxide  400 mg Oral BID  . pantoprazole  80 mg Oral Q1200  . pregabalin  150 mg Oral BID  . simvastatin  10 mg Oral QHS  . sodium chloride  3 mL Intravenous Q12H  . torsemide  40 mg Oral Daily    Infusions: . milrinone 0.25 mcg/kg/min (02/26/15 2100)    PRN Medications: sodium chloride, acetaminophen, acetaminophen, albuterol, bisacodyl, dextrose, ondansetron (ZOFRAN) IV, oxyCODONE, sodium chloride, tiZANidine, traMADol, zolpidem   Assessment/Plan    1. Acute/Chonic Systolic Heart Failure: In past, suspected nonischemic cardiomyopathy => cardiogenic shock, now requiring milrinone. Volume status remains elevated. CVP 20. CO-OX stable 61%. Hold lasix drip today. Creatinine trending up.  - Continue milrinone 0.25 mcg/kg/min. Renal function worsening. Add 3 mcg dopamine.  . - Hold off on ACE, spiro and b-blocker due to shock and recent renal failure.  - Continue hydralazine/nitrates, can transition to Bidil for discharge but cost is an issue.   - If renal function remains stable will consider coronary angiography in the near future as we have never ruled out CAD/ischemic cardiomyopathy and he has significant PAD.  - Likely not candidate for LVAD or transplant due to limited mobility with legs 2. PAD: Ischemic right foot. VVS following. Peripheral arterial dopplers suggest possible aortoiliac occlusive disease and a stenosis in the right mid SFA.  - S/P Arteriogram wit stent x 2 R common iliac artery. No dye yesed 3. CKD, stage III: Creatinine trending up 1.8>2.1>2.6. Add 3 mcg Dopamine. .  4. 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  5. DM II: Adjust per Diabetes coordinator. Lantus restarted 8/22 . Will increase again today 6. Anemia, iron-deficiency 7. Tobacco use: Smoked over 30 years. Continues to smoke 1/2 ppd, recommended that he  quit.  8. PNA: Started levofloxacin 8/19. Plan for 7 days total.  9.Hyponatremia- Na 131>129> 125 . May need tolvaptan.   Disposition [facility] to follow when discharged for home milrinone + diuretics protocol.   CLEGG,AMY,NP-C  7:29 AM Advanced Heart Failure Team Pager [phone] (M-F; 7a - 4p)  Please contact [facility] for night-coverage after hours (4p -7a ) and weekends on [email or web address]   Patient seen and examined with [name], NP. We discussed all aspects of the encounter. I agree with the assessment and plan as stated above.   S/p PCI of R  iliac stenosis.(no contrast). CVP remains markedly elevated but creatinine worsening with attempts at diuresis despite adequate co-ox. Will continue milrinone. Dopamine added. Continue gentle diuresis. Continue to follow R foot.   Lanaiya Lantry,MD 2:07 PM

## 2015-02-28 ENCOUNTER — Encounter: Payer: Self-pay | Admitting: Cardiology

## 2015-02-28 ENCOUNTER — Encounter: Payer: Self-pay | Admitting: Internal Medicine

## 2015-02-28 LAB — GLUCOSE, CAPILLARY
GLUCOSE-CAPILLARY: 160 mg/dL — AB (ref 65–99)
GLUCOSE-CAPILLARY: 194 mg/dL — AB (ref 65–99)
GLUCOSE-CAPILLARY: 204 mg/dL — AB (ref 65–99)
Glucose-Capillary: 234 mg/dL — ABNORMAL HIGH (ref 65–99)

## 2015-02-28 LAB — CARBOXYHEMOGLOBIN
CARBOXYHEMOGLOBIN: 2.4 % — AB (ref 0.5–1.5)
Carboxyhemoglobin: 1.9 % — ABNORMAL HIGH (ref 0.5–1.5)
METHEMOGLOBIN: 0.6 % (ref 0.0–1.5)
Methemoglobin: 0.9 % (ref 0.0–1.5)
O2 SAT: 93.2 %
O2 Saturation: 65.7 %
TOTAL HEMOGLOBIN: 9.1 g/dL — AB (ref 13.5–18.0)
Total hemoglobin: 9.3 g/dL — ABNORMAL LOW (ref 13.5–18.0)

## 2015-02-28 LAB — BASIC METABOLIC PANEL
Anion gap: 11 (ref 5–15)
BUN: 57 mg/dL — AB (ref 6–20)
CALCIUM: 9.2 mg/dL (ref 8.9–10.3)
CO2: 32 mmol/L (ref 22–32)
CREATININE: 2.81 mg/dL — AB (ref 0.61–1.24)
Chloride: 84 mmol/L — ABNORMAL LOW (ref 101–111)
GFR calc Af Amer: 28 mL/min — ABNORMAL LOW (ref >60)
GFR, EST NON AFRICAN AMERICAN: 24 mL/min — AB (ref >60)
Glucose, Bld: 126 mg/dL — ABNORMAL HIGH (ref 65–99)
POTASSIUM: 3.4 mmol/L — AB (ref 3.5–5.1)
SODIUM: 127 mmol/L — AB (ref 135–145)

## 2015-02-28 LAB — DIGOXIN LEVEL: Digoxin Level: <0.2 ng/mL — ABNORMAL LOW (ref 0.8–2.0)

## 2015-02-28 MED ORDER — GUAIFENESIN-DM 100-10 MG/5ML PO SYRP
5.0000 mL | ORAL_SOLUTION | ORAL | Status: DC | PRN
  Administered 2015-02-28: 5 mL via ORAL
  Filled 2015-02-28: qty 5

## 2015-02-28 MED ORDER — HYDRALAZINE HCL 25 MG PO TABS
25.0000 mg | ORAL_TABLET | Freq: Three times a day (TID) | ORAL | Status: DC
  Administered 2015-02-28 – 2015-03-01 (×3): 25 mg via ORAL

## 2015-02-28 NOTE — Progress Notes (Addendum)
[facility]  Subjective  - Doing better wants to go home.   Objective 113/77 110 97.9 F (36.6 C) (Oral) 18 98%  Intake/Output Summary (Last 24 hours) at 02/28/15 0819 Last data filed at 02/28/15 0700  Gross per 24 hour  Intake 1463.1 ml  Output   1050 ml  Net  413.1 ml    Right groin soft without hematoma LE warm to touch doppler signal not audible, no other doppler available. Right foot dressing in place  Assessment/Planning: 1. Ultrasound-guided access to the right common femoral artery 2. Aortogram with bilateral iliac arteriogram 3. Angioplasty and stenting of 2 common iliac artery stenoses ( 7 mm x 24 mm balloon expandable stent and 8 mm x 40 mm self-expanding stent) 4. Right femoral arteriogram with right lower extremity runoff using CO2  -Follow up in 4 weeks with Dr. Hart Rochester and arterial duplex Right foot dressing daily Vaseline gauze to right foot wounds Q day, then cover with kerlex.  Clinton Gallant [facility] 02/28/2015 8:19 AM --  Laboratory Lab Results:  Recent Labs  02/26/15 0339  WBC 7.2  HGB 8.4*  HCT 26.9*  PLT 215   BMET  Recent Labs  02/27/15 1400 02/28/15 0420  NA 121* 127*  K 3.5 3.4*  CL 86* 84*  CO2 30 32  GLUCOSE 350* 126*  BUN 54* 57*  CREATININE 2.96* 2.81*  CALCIUM 8.5* 9.2    COAG No results found for: INR, PROTIME No results found for: PTT    Agree with above assessment We'll see patient in 4 weeks for follow-up

## 2015-02-28 NOTE — Progress Notes (Signed)
Patient ID: Christian Wall, male   DOB: [birth year], 54 y.o.   MRN: [id number] Advanced Heart Failure Rounding Note   Subjective:    Admitted from HF clinic with suspected ischemic foot and low output. CO-OX on admit 43%.    Events  8/22 CO2 angiogram with 2 stents.   8/23 On Milrinone + Dopamine. Given 250 cc NS for soft bp   Denies SOB . Wants to go home.   Creatinine  2.5> 2.6>2.8 CO-OX 93% repeat now.    Objective:   Weight Range:  Vital Signs:   Temp:  [97.5 F (36.4 C)-98.2 F (36.8 C)] 98.2 F (36.8 C) (08/24 0400) Pulse Rate:  [101-105] 103 (08/24 0600) Resp:  [9-21] 13 (08/24 0600) BP: (84-128)/(47-78) 112/50 mmHg (08/24 0600) SpO2:  [91 %-97 %] 95 % (08/24 0600) Weight:  [209 lb 14.1 oz (95.2 kg)] 209 lb 14.1 oz (95.2 kg) (08/24 0400) Last BM Date: 02/26/15  Weight change: Filed Weights   02/26/15 0337 02/27/15 0405 02/28/15 0400  Weight: 204 lb 12.9 oz (92.9 kg) 205 lb 0.4 oz (93 kg) 209 lb 14.1 oz (95.2 kg)    Intake/Output:   Intake/Output Summary (Last 24 hours) at 02/28/15 0727 Last data filed at 02/28/15 0700  Gross per 24 hour  Intake 1619.5 ml  Output    700 ml  Net  919.5 ml     Physical Exam: CVP 19 General: . No resp difficulty. Sitting in chair.    HEENT: normal Neck: supple. JVP ~10. Carotids 2+ bilaterally; no bruits. No lymphadenopathy or thryomegaly appreciated. Cor: PMI laterally displaced. Regular, mildly tachycardic,+S3, 2/6 MR Lungs: Lungs decreased in the bases.  Abdomen: soft, obese nontender, nondistended. No hepatosplenomegaly. No bruits or masses. Good bowel sounds. Extremities: very cool. No clubbing, rash, edema. RUE PICC. Areas of necrosis and skin sloughing on dorsum of both feet R>>L. No palpable pulses .  Neuro: alert & orientedx3, cranial nerves grossly intact. Moves all 4 extremities w/o difficulty. Weak in bilateral legs. Affect pleasant.  Telemetry: ST 100-110   Labs: Basic Metabolic Panel:  Recent  Labs Lab 02/21/15 1415  02/23/15 0325  02/26/15 0339 02/26/15 2040 02/27/15 0530 02/27/15 1400 02/28/15 0420  NA 134*  < > 136  < > 129* 125* 125* 121* 127*  K 4.0  < > 3.8  < > 3.6 3.4* 3.8 3.5 3.4*  CL 94*  < > 91*  < > 87* 84* 85* 86* 84*  CO2 30  < > 35*  < > 32  GLUCOSE 277*  < > 94  < > 297* 414* 339* 350* 126*  BUN 58*  < > 44*  < > 52* 49* 52* 54* 57*  CREATININE 2.04*  < > 1.52*  < > 2.12* 2.54* 2.60* 2.96* 2.81*  CALCIUM 9.3  < > 9.0  < > 8.9 8.6* 8.6* 8.5* 9.2  MG 1.7  --  2.4  --   --  1.8  --   --   --   < > = values in this interval not displayed.  Liver Function Tests:  Recent Labs Lab 02/21/15 1415  AST 38  ALT 35  ALKPHOS 448*  BILITOT 2.0*  PROT 7.0  ALBUMIN 2.8*   No results for input(s): LIPASE, AMYLASE in the last 168 hours. No results for input(s): AMMONIA in the last 168 hours.  CBC:  Recent Labs Lab 02/21/15 1415 02/23/15 0325 02/26/15 0339  WBC 7.6 12.9* 7.2  NEUTROABS  5.6  --   --   HGB 9.9* 9.7* 8.4*  HCT 31.1* 30.9* 26.9*  MCV 71.8* 71.0* 70.8*  PLT 365 346 215    Cardiac Enzymes: No results for input(s): CKTOTAL, CKMB, CKMBINDEX, TROPONINI in the last 168 hours.  BNP: BNP (last 3 results)  Recent Labs  01/30/15 1205 01/30/15 2020 02/21/15 1415  BNP 3105.0* 2790.6* 1554.1*    ProBNP (last 3 results) No results for input(s): PROBNP in the last 8760 hours.    Other results:  Imaging: No results found.   Medications:     Scheduled Medications: . atorvastatin  40 mg Oral q1800  . buPROPion  150 mg Oral BID  . cefTRIAXone (ROCEPHIN)  IV  1 g Intravenous Daily  . clopidogrel  75 mg Oral Daily  . digoxin  0.0625 mg Oral Daily  . DULoxetine  30 mg Oral Daily  . enoxaparin (LOVENOX) injection  40 mg Subcutaneous Daily  . hydrALAZINE  75 mg Oral 3 times per day  . insulin aspart  0-15 Units Subcutaneous TID WC  . insulin aspart  0-5 Units Subcutaneous QHS  . insulin glargine  25 Units Subcutaneous  QHS  . isosorbide mononitrate  30 mg Oral Daily  . lipase/protease/amylase  3 capsule Oral TID AC  . magnesium oxide  400 mg Oral BID  . pantoprazole  80 mg Oral Q1200  . pregabalin  150 mg Oral BID  . simvastatin  10 mg Oral QHS  . sodium chloride  3 mL Intravenous Q12H    Infusions: . DOPamine 3 mcg/kg/min (02/27/15 0815)  . milrinone 0.25 mcg/kg/min (02/28/15 0200)    PRN Medications: sodium chloride, acetaminophen, acetaminophen, albuterol, alum & mag hydroxide-simeth, bisacodyl, dextrose, guaiFENesin-dextromethorphan, ondansetron (ZOFRAN) IV, oxyCODONE, sodium chloride, tiZANidine, traMADol, zolpidem   Assessment/Plan    1. Acute/Chonic Systolic Heart Failure: In past, suspected nonischemic cardiomyopathy => cardiogenic shock, now requiring milrinone. ECHO 8/23 EF 15 % High ventricular filling pressures. RV mildly dilated.  Volume status improved despite elevated CVP.   Hold lasix drip today. Creatinine trending down to 2.8 after receiving 250 NS. No diuretics today. Stop dopamine.  CO-OX elevated. Repeat now. Continue milrinone 0.25 mcg/kg/min.  . - Hold off on ACE, spiro and b-blocker due to shock and recent renal failure.  - back off hydralazine a little. Cut back to 25 mg tid. Continue current dose of imdur. .   - If renal function remains stable will consider coronary angiography in the near future as we have never ruled out CAD/ischemic cardiomyopathy and he has significant PAD.  - Likely not candidate for LVAD or transplant due to limited mobility with legs 2. PAD: Ischemic right foot. VVS following. Peripheral arterial dopplers suggest possible aortoiliac occlusive disease and a stenosis in the right mid SFA.  - S/P Arteriogram wit stent x 2 R common iliac artery. No dye used  3. CKD, stage III: Creatinine coming down after a little IV fluids. Stop dopamine. .  4. 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   5. DM II: Adjust per Diabetes coordinator. Lantus restarted 8/22 . Glucose better controlled.  6. Anemia, iron-deficiency 7. Tobacco use: Smoked over 30 years. Continues to smoke 1/2 ppd, recommended that he quit.  8. PNA: Started levofloxacin 8/19. Plan for 7 days total.  9.Hyponatremia- Na 131>129> 125>121>127  .  Disposition [facility] to follow when discharged for home milrinone + diuretics protocol.   Possible D/C tomorrow.   CLEGG,AMY,NP-C  7:27  AM Advanced Heart Failure Team Pager [phone] (M-F; 7a - 4p)   Patient seen and examined with [name], NP. We discussed all aspects of the encounter. I agree with the assessment and plan as stated above.   Echo images reviewed personally. EF 15% moderate RV dysfunction. + restrictive filling pattern. CVP remains elevated but acting dry with poor urine output and worsening creatinine. Improved with IVF last night. Reweighed this am and weight is down. Will hold diuretics and let him equilibrate. Continue milrinone. Continue to follow R foot wound.   Hopefully home tomorrow.   Kenedie Dirocco,MD 8:00 AM

## 2015-03-01 ENCOUNTER — Telehealth: Payer: Self-pay | Admitting: Vascular Surgery

## 2015-03-01 LAB — CARBOXYHEMOGLOBIN
CARBOXYHEMOGLOBIN: 1.6 % — AB (ref 0.5–1.5)
Carboxyhemoglobin: 1.7 % — ABNORMAL HIGH (ref 0.5–1.5)
Methemoglobin: 0.9 % (ref 0.0–1.5)
Methemoglobin: 0.9 % (ref 0.0–1.5)
O2 SAT: 53.2 %
O2 SAT: 55.8 %
TOTAL HEMOGLOBIN: 9.1 g/dL — AB (ref 13.5–18.0)
TOTAL HEMOGLOBIN: 9.1 g/dL — AB (ref 13.5–18.0)

## 2015-03-01 LAB — BASIC METABOLIC PANEL
ANION GAP: 10 (ref 5–15)
BUN: 62 mg/dL — ABNORMAL HIGH (ref 6–20)
CHLORIDE: 86 mmol/L — AB (ref 101–111)
CO2: 30 mmol/L (ref 22–32)
CREATININE: 2.6 mg/dL — AB (ref 0.61–1.24)
Calcium: 8.8 mg/dL — ABNORMAL LOW (ref 8.9–10.3)
GFR calc non Af Amer: 26 mL/min — ABNORMAL LOW (ref >60)
GFR, EST AFRICAN AMERICAN: 31 mL/min — AB (ref >60)
Glucose, Bld: 141 mg/dL — ABNORMAL HIGH (ref 65–99)
Potassium: 4.2 mmol/L (ref 3.5–5.1)
SODIUM: 126 mmol/L — AB (ref 135–145)

## 2015-03-01 LAB — GLUCOSE, CAPILLARY
GLUCOSE-CAPILLARY: 124 mg/dL — AB (ref 65–99)
Glucose-Capillary: 241 mg/dL — ABNORMAL HIGH (ref 65–99)

## 2015-03-01 MED ORDER — HYDRALAZINE HCL 25 MG PO TABS: 25.0000 mg | ORAL_TABLET | Freq: Three times a day (TID) | ORAL | Status: DC

## 2015-03-01 MED ORDER — CEPHALEXIN 500 MG PO CAPS: 500.0000 mg | ORAL_CAPSULE | Freq: Three times a day (TID) | ORAL | Status: DC

## 2015-03-01 MED ORDER — TORSEMIDE 20 MG PO TABS: 20.0000 mg | ORAL_TABLET | Freq: Every day | ORAL | Status: DC

## 2015-03-01 MED ORDER — ATORVASTATIN CALCIUM 40 MG PO TABS: 40.0000 mg | ORAL_TABLET | Freq: Every day | ORAL | Status: AC

## 2015-03-01 MED ORDER — TORSEMIDE 20 MG PO TABS: 20.0000 mg | ORAL_TABLET | Freq: Two times a day (BID) | ORAL | Status: DC

## 2015-03-01 MED ORDER — MILRINONE IN DEXTROSE 20 MG/100ML IV SOLN: 0.3750 ug/kg/min | INTRAVENOUS | Status: DC

## 2015-03-01 MED ORDER — DIGOXIN 125 MCG PO TABS: 0.0625 mg | ORAL_TABLET | Freq: Every day | ORAL | Status: DC

## 2015-03-01 MED ORDER — TORSEMIDE 20 MG PO TABS
20.0000 mg | ORAL_TABLET | Freq: Two times a day (BID) | ORAL | Status: DC
  Administered 2015-03-01: 20 mg via ORAL
  Filled 2015-03-01: qty 1

## 2015-03-01 MED ORDER — CLOPIDOGREL BISULFATE 75 MG PO TABS: 75.0000 mg | ORAL_TABLET | Freq: Every day | ORAL | Status: DC

## 2015-03-01 NOTE — Telephone Encounter (Signed)
Unable to reach pt, phone # rings once and then disconnects. Mailed letter, dpm

## 2015-03-01 NOTE — Telephone Encounter (Signed)
-----   Message from Sharee Pimple, RN sent at 02/27/2015  4:57 PM EDT ----- Regarding: Schedule   ----- Message -----    From: Raymond Gurney, PA-C    Sent: 02/27/2015   3:34 PM      To: Vvs Charge Pool  S/p aortogram with stenting x 2 of right common iliac 02/26/15  F/u in 4 weeks with Dr. Hart Rochester with ABIs and RLE arterial duplex.   Thanks Selena Batten

## 2015-03-01 NOTE — Progress Notes (Signed)
Patient ID: Christian Wall, male   DOB: [birth year], 54 y.o.   MRN: [id number] Advanced Heart Failure Rounding Note   Subjective:    Admitted from HF clinic with suspected ischemic foot and low output. CO-OX on admit 43%.    Events  8/22 CO2 angiogram with 2 stents.   8/23 On Milrinone + Dopamine. Given 250 cc NS for soft bp 8/24 Dopamine stopped. Remained on milrinone 0.25 mcg. Diuretics held.    Wants to go home. Upset he cant leave now. Denies SOB   Creatinine  2.5> 2.6>2.8>pending  CO-OX 53%   Objective:   Weight Range:  Vital Signs:   Temp:  [97.9 F (36.6 C)-98.2 F (36.8 C)] 98.2 F (36.8 C) (08/25 0353) Pulse Rate:  [99-110] 99 (08/25 0042) Resp:  [13-19] 14 (08/25 0042) BP: (93-113)/(65-77) 102/65 mmHg (08/25 0042) SpO2:  [91 %-99 %] 91 % (08/25 0400) Weight:  [194 lb 3.6 oz (88.1 kg)-196 lb 3.4 oz (89 kg)] 196 lb 3.4 oz (89 kg) (08/25 0600) Last BM Date: 02/26/15  Weight change: Filed Weights   02/28/15 0400 02/28/15 0744 03/01/15 0600  Weight: 209 lb 14.1 oz (95.2 kg) 194 lb 3.6 oz (88.1 kg) 196 lb 3.4 oz (89 kg)    Intake/Output:   Intake/Output Summary (Last 24 hours) at 03/01/15 0713 Last data filed at 03/01/15 0600  Gross per 24 hour  Intake 1089.83 ml  Output    450 ml  Net 639.83 ml     Physical Exam: CVP 19 General: . No resp difficulty. Sitting in chair.    HEENT: normal Neck: supple. JVP ~10. Carotids 2+ bilaterally; no bruits. No lymphadenopathy or thryomegaly appreciated. Cor: PMI laterally displaced. Regular, mildly tachycardic,+S3, 2/6 MR Lungs: Lungs decreased in the bases.  Abdomen: soft, obese nontender, nondistended. No hepatosplenomegaly. No bruits or masses. Good bowel sounds. Extremities: very cool. No clubbing, rash, edema. RUE PICC. Areas of necrosis and skin sloughing on dorsum of both feet R>>L. No palpable pulses .  Neuro: alert & orientedx3, cranial nerves grossly intact. Moves all 4 extremities w/o difficulty. Weak in  bilateral legs. Affect pleasant.  Telemetry: ST 100-110   Labs: Basic Metabolic Panel:  Recent Labs Lab 02/23/15 0325  02/26/15 0339 02/26/15 2040 02/27/15 0530 02/27/15 1400 02/28/15 0420  NA 136  < > 129* 125* 125* 121* 127*  K 3.8  < > 3.6 3.4* 3.8 3.5 3.4*  CL 91*  < > 87* 84* 85* 86* 84*  CO2 35*  < > 31 31 31 30  32  GLUCOSE 94  < > 297* 414* 339* 350* 126*  BUN 44*  < > 52* 49* 52* 54* 57*  CREATININE 1.52*  < > 2.12* 2.54* 2.60* 2.96* 2.81*  CALCIUM 9.0  < > 8.9 8.6* 8.6* 8.5* 9.2  MG 2.4  --   --  1.8  --   --   --   < > = values in this interval not displayed.  Liver Function Tests: No results for input(s): AST, ALT, ALKPHOS, BILITOT, PROT, ALBUMIN in the last 168 hours. No results for input(s): LIPASE, AMYLASE in the last 168 hours. No results for input(s): AMMONIA in the last 168 hours.  CBC:  Recent Labs Lab 02/23/15 0325 02/26/15 0339  WBC 12.9* 7.2  HGB 9.7* 8.4*  HCT 30.9* 26.9*  MCV 71.0* 70.8*  PLT 346 215    Cardiac Enzymes: No results for input(s): CKTOTAL, CKMB, CKMBINDEX, TROPONINI in the last 168 hours.  BNP: BNP (last  3 results)  Recent Labs  01/30/15 1205 01/30/15 2020 02/21/15 1415  BNP 3105.0* 2790.6* 1554.1*    ProBNP (last 3 results) No results for input(s): PROBNP in the last 8760 hours.    Other results:  Imaging: No results found.   Medications:     Scheduled Medications: . atorvastatin  40 mg Oral q1800  . buPROPion  150 mg Oral BID  . cefTRIAXone (ROCEPHIN)  IV  1 g Intravenous Daily  . clopidogrel  75 mg Oral Daily  . digoxin  0.0625 mg Oral Daily  . DULoxetine  30 mg Oral Daily  . enoxaparin (LOVENOX) injection  40 mg Subcutaneous Daily  . hydrALAZINE  25 mg Oral 3 times per day  . insulin aspart  0-15 Units Subcutaneous TID WC  . insulin aspart  0-5 Units Subcutaneous QHS  . insulin glargine  25 Units Subcutaneous QHS  . isosorbide mononitrate  30 mg Oral Daily  . lipase/protease/amylase  3  capsule Oral TID AC  . magnesium oxide  400 mg Oral BID  . pantoprazole  80 mg Oral Q1200  . pregabalin  150 mg Oral BID  . simvastatin  10 mg Oral QHS  . sodium chloride  3 mL Intravenous Q12H    Infusions: . milrinone 0.25 mcg/kg/min (02/28/15 1723)    PRN Medications: sodium chloride, acetaminophen, acetaminophen, albuterol, alum & mag hydroxide-simeth, bisacodyl, dextrose, guaiFENesin-dextromethorphan, ondansetron (ZOFRAN) IV, oxyCODONE, sodium chloride, tiZANidine, traMADol, zolpidem   Assessment/Plan    1. Acute/Chonic Systolic Heart Failure: In past, suspected nonischemic cardiomyopathy => cardiogenic shock, now requiring milrinone. ECHO 8/23 EF 15 % High ventricular filling pressures. RV mildly dilated.  Volume status improved despite elevated CVP. BMET pending.   CO-OX down to 53%. Increase milrinone 0.375 mcg.  Repeat CO-OX at 10:00 - Restart home diuretic regimen. Torsemide 20 mg daily.  . - Hold off on ACE, spiro and b-blocker due to shock and recent renal failure.  - Continue hydralazine 25 mg tid. Continue current dose of imdur.    - If renal function remains stable will consider coronary angiography in the near future as we have never ruled out CAD/ischemic cardiomyopathy and he has significant PAD.  - Likely not candidate for LVAD or transplant due to limited mobility with legs 2. PAD: Ischemic right foot. [facility] following. Peripheral arterial dopplers suggest possible aortoiliac occlusive disease and a stenosis in the right mid SFA. Will need daily dressings to R foot wound. vaseline gauze daily.  - S/P Arteriogram wit stent x 2 R common iliac artery. No dye used  3. CKD, stage III: Creatinine coming down after a little IV fluids. Stop dopamine. .  4. 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  5. DM II: Adjust per Diabetes coordinator. Lantus restarted 8/22 . Glucose better controlled.  6. Anemia,  iron-deficiency 7. Tobacco use: Smoked over 30 years. Continues to smoke 1/2 ppd, recommended that he quit.  8. PNA: Started levofloxacin 8/19. Plan for 7 days total.  9.Hyponatremia- Na 131>129> 125>121>127> pending.   .  Disposition [facility] to follow when discharged for home milrinone + diuretics protocol. If CO-OX and BMET ok will d/c later today.   D/C today    CLEGG,AMY,NP-C  7:13 AM Advanced Heart Failure Team Pager [phone] (M-F; 7a - 4p)   Patient seen and examined with [name], NP. We discussed all aspects of the encounter. I agree with the assessment and plan as stated above.   Overall improved but  he is still somewhat volume overloaded and diuresis has been limited by renal function. He is adamant that he wants to go home today. Will increase milrinone to 0.375 and demadex to 20 bid. Will follow very closely as outpatient. R foot somewhat improved after PCI. Will have [facility] following for help with wound care.   Bensimhon, Daniel,MD 11:59 AM

## 2015-03-01 NOTE — Discharge Summary (Signed)
Advanced Heart Failure Team  Discharge Summary   Patient ID: Christian Wall MRN: [id number], DOB/AGE: 12/22/1960 54 y.o. Admit date: 02/21/2015 D/C date:     03/01/2015   Primary Discharge Diagnoses:  1. Acute/Chonic Systolic Heart Failure:  2. PAD: Ischemic right foot. - S/P Arteriogram wit stent x 2 R common iliac artery. No dye used  3. CKD, stage III:  4. 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  5. DM II:   6. Anemia, iron-deficiency 7. Tobacco use: Smoked over 30 years. Continues to smoke 1/2 ppd, recommended that he quit.  8. PNA: Started levofloxacin 8/19. Plan for 7 days total.  9.Hyponatremia 10. UTI - Received rocephin and transitioned to keflex for discharge. To complete a total of 7 days.   Hospital Course:  Christian Wall is a 54 yo male with h/o of systolic HF with EF 20-25%, polysubstance abuse, DM2, remote pancreatitis c/b prolonged hospitalization resulting in leg atrophy (?critical care myopathy) several years ago.  Admitted from Heart Failure Clinic with suspected RLE ischemia and low output HF after recent discontinuation of home milrinone.    R and L foot had black areas and superficial skin loss noted with diminished pulses. ABI obtained and showed possible aortoiliac occlusive disease and stenosis in the right mid SFA. Vascular surgery consulted on the day of admit with recommendations for conservative topical wound care.    Mixed venous saturation on admit was  50% so milrinone was restarted. Also had volume overload so he was diuresed with lasix drip. Initially his extremities improved with addition of milrinone but later RLE appeared ischemic. At that time he had CO2 arteriogram with  2 stents to common iliac artery. R and LLE post procedure remained warm with dopplered pulses. He will continue on statin and plavix.   It was difficulty to fully diurese with RV failure and worsening renal  function. Dopamine was added short term but he had no real improvement. Diuretics held even though CVP was high. Given RV failure, a higher CVP was telt to be adequate and he was started back torsemide. Unfortunately we were unable to wean milrinone. Plan to continue milrinone 0.375 mcg chronically .   Sodium on admit was 134 but dropped to low 121 with aggressive diuresis. Sodium on discharge was up to 126 and will continue to be followed closely as an outpatient.   CXR on 8/18 concerning for pneumonia as well as elevated WBC so he received a course of levaquin.  Also had UTI with culture showing E Coli. Received 2 dose rocephin and transitioned to keflex and will complete 7 day course of antibiotics.    He will continue to be followed closely by [facility] for home milrinone at 0.375 mcg and weekly. [facility] will also provide wound care to R foot with Vaseline gauze daily. He will follow up in the HF clinic next week. He will follow up with Dr Hart Rochester in 1 month.   Studies 02/21/2015 ABI- aortoiliac occlusive disease and a stenosis in the right mid SFA 02/27/2015 ECHO -EF 15% restrictive physiology. High ventricular filling pressures.   Procedures 02/26/2015 1. Ultrasound-guided access to the right common femoral artery 2. Aortogram with bilateral iliac arteriogram 3. Angioplasty and stenting of 2 common iliac artery stenoses ( 7 mm x 24 mm balloon expandable stent and 8 mm x 40 mm self-expanding stent) 4. Right femoral arteriogram with right lower extremity runoff using CO2     Discharge Weight Range: 196 pounds.  Discharge Vitals: Blood pressure 118/71, pulse 100, temperature 98.7 F (37.1 C), temperature source Oral, resp. rate 12, height  (1.727 m), weight 196 lb 3.4 oz (89 kg), SpO2 99 %.  Labs: Lab Results  Component Value Date   WBC 7.2 02/26/2015   HGB 8.4* 02/26/2015   HCT 26.9* 02/26/2015   MCV 70.8* 02/26/2015   PLT 215 02/26/2015    Recent Labs Lab 03/01/15 0725  NA 126*  K  4.2  CL 86*  CO2 30  BUN 62*  CREATININE 2.60*  CALCIUM 8.8*  GLUCOSE 141*   Lab Results  Component Value Date   CHOL 185 02/02/2015   HDL 10* 02/02/2015   LDLCALC 132* 02/02/2015   TRIG 213* 02/02/2015   BNP (last 3 results)  Recent Labs  01/30/15 1205 01/30/15 2020 02/21/15 1415  BNP 3105.0* 2790.6* 1554.1*    ProBNP (last 3 results) No results for input(s): PROBNP in the last 8760 hours.   Diagnostic Studies/Procedures   No results found.  Discharge Medications     Medication List    STOP taking these medications        simvastatin 10 MG tablet  Commonly known as:  ZOCOR      TAKE these medications        acetaminophen 500 MG tablet  Commonly known as:  TYLENOL  Take 2 tablets (1,000 mg total) by mouth every 6 (six) hours as needed for mild pain or headache.     atorvastatin 40 MG tablet  Commonly known as:  LIPITOR  Take 1 tablet (40 mg total) by mouth daily at 6 PM.     bisacodyl 5 MG EC tablet  Commonly known as:  DULCOLAX  Take 5 mg by mouth daily as needed for mild constipation or moderate constipation.     buPROPion 150 MG 12 hr tablet  Commonly known as:  WELLBUTRIN SR  Take 150 mg by mouth 2 (two) times daily.     cephALEXin 500 MG capsule  Commonly known as:  KEFLEX  Take 1 capsule (500 mg total) by mouth 3 (three) times daily.     clopidogrel 75 MG tablet  Commonly known as:  PLAVIX  Take 1 tablet (75 mg total) by mouth daily.     CVS VITAMIN B12 1000 MCG tablet  Generic drug:  cyanocobalamin  Take 1,000 mcg by mouth daily.     digoxin 0.125 MG tablet  Commonly known as:  LANOXIN  Take 0.5 tablets (0.0625 mg total) by mouth daily.     DULoxetine 30 MG capsule  Commonly known as:  CYMBALTA  Take 1 capsule (30 mg total) by mouth daily.     esomeprazole 40 MG capsule  Commonly known as:  NEXIUM  Take 40 mg by mouth daily before breakfast.     hydrALAZINE 25 MG tablet  Commonly known as:  APRESOLINE  Take 1 tablet (25 mg  total) by mouth every 8 (eight) hours.     insulin aspart 100 UNIT/ML injection  Commonly known as:  novoLOG  Inject 0-15 Units into the skin 3 (three) times daily with meals. CBG 70 - 120: 0 units CBG 121 - 150: 2 units CBG 151 - 200: 3 units CBG 201 - 250: 5 units CBG 251 - 300: 8 units CBG 301 - 350: 11 units CBG 351 - 400: 15 units     insulin glargine 100 UNIT/ML injection  Commonly known as:  LANTUS  Inject 0.3-0.4 mLs (30-40 Units total)  into the skin 2 (two) times daily. 40 U am, 30 U pm     isosorbide mononitrate 30 MG 24 hr tablet  Commonly known as:  IMDUR  Take 1 tablet (30 mg total) by mouth daily.     lipase/protease/amylase 81191 UNITS Cpep capsule  Commonly known as:  CREON  Take 1-3 capsules by mouth 2 (two) times daily. 3 capsules before meal, and 1 capsule before snack     magnesium oxide 400 (241.3 MG) MG tablet  Commonly known as:  MAG-OX  Take 400 mg by mouth 2 (two) times daily.     milrinone 20 MG/100ML Soln infusion  Commonly known as:  PRIMACOR  Inject 31.8375 mcg/min into the vein continuous. Per [facility]. 12 months     oxyCODONE 5 MG immediate release tablet  Commonly known as:  Oxy IR/ROXICODONE  Take 5 mg by mouth every 6 (six) hours as needed for moderate pain or severe pain.     PATADAY 0.2 % Soln  Generic drug:  Olopatadine HCl  Apply 1 drop to eye daily as needed. For allergies     pregabalin 150 MG capsule  Commonly known as:  LYRICA  Take 150 mg by mouth 2 (two) times daily.     tiZANidine 4 MG tablet  Commonly known as:  ZANAFLEX  Take 4 mg by mouth 2 (two) times daily as needed for muscle spasms.     torsemide 20 MG tablet  Commonly known as:  DEMADEX  Take 1 tablet (20 mg total) by mouth 2 (two) times daily.     traMADol 50 MG tablet  Commonly known as:  ULTRAM  Take 50 mg by mouth every 6 (six) hours as needed for moderate pain.     VENTOLIN HFA 108 (90 BASE) MCG/ACT inhaler  Generic drug:  albuterol  Inhale 1-2 puffs into the  lungs every 6 (six) hours as needed for wheezing or shortness of breath.        Disposition   The patient will be discharged in stable condition to home.     Discharge Instructions    Contraindication to ACEI at discharge    Complete by:  As directed      Diet - low sodium heart healthy    Complete by:  As directed      Heart Failure patients record your daily weight using the same scale at the same time of day    Complete by:  As directed      Increase activity slowly    Complete by:  As directed           Follow-up Information    Follow up with Josephina Gip, MD In 4 weeks.   Specialties:  Vascular Surgery, Interventional Cardiology, Cardiology   Why:  Our office will call you to arrange an appointment (sent)   Contact information:   [address] [place] [address] [phone]       Follow up with Arvilla Meres, MD On 03/08/2015.   Specialty:  Cardiology   Why:  at 11:20 [address] 0090   Contact information:   [address] [place] [address] [phone]         Duration of Discharge Encounter: Greater than 35 minutes   Signed, CLEGG,AMY NP-C  03/01/2015, 9:41 AM  Patient seen and examined with [name], NP. We discussed all aspects of the encounter. I agree with the assessment and plan as stated above.   Patient with refractory biventricular  HF. Some improvement with IV milrinone and diuresis. Also with RLE ischemic changes. Seen by [facility] as above. He is adamant about going home today. Will arrange for home milrinone. Resume po diuretics. Wound Care and f/u with [facility] arranged. He remains quite tenuous.   Ashlie Mcmenamy,MD 3:30 PM

## 2015-03-01 NOTE — Progress Notes (Signed)
D/c teaching and instructions complete with pt and his spouse. Both verbalized understanding of all instructions given. Complete with 2L FR and low sodium diet teaching. Pt and spouse taught back medication and heart failure instructions. Advanced home care connected pt's home Milrinone pump. Pt taken to pt discharge area in [facility] and assisted into vehicle.

## 2015-03-01 NOTE — Progress Notes (Signed)
Reds Vest Reading Completed-- 42

## 2015-03-01 NOTE — Discharge Instructions (Signed)

## 2015-03-08 ENCOUNTER — Ambulatory Visit (HOSPITAL_BASED_OUTPATIENT_CLINIC_OR_DEPARTMENT_OTHER)
Admit: 2015-03-08 | Discharge: 2015-03-08 | Disposition: A | Discharge status: Home / Self Care | Payer: Medicare Other | Source: Ambulatory Visit | Attending: Internal Medicine | Admitting: Internal Medicine

## 2015-03-08 VITALS — BP 108/66 | HR 84 | Wt 192.4 lb

## 2015-03-08 DIAGNOSIS — Z720 Tobacco use: Secondary | ICD-10-CM

## 2015-03-08 DIAGNOSIS — D509 Iron deficiency anemia, unspecified: Secondary | ICD-10-CM | POA: Insufficient documentation

## 2015-03-08 DIAGNOSIS — Z7902 Long term (current) use of antithrombotics/antiplatelets: Secondary | ICD-10-CM

## 2015-03-08 DIAGNOSIS — Z833 Family history of diabetes mellitus: Secondary | ICD-10-CM | POA: Insufficient documentation

## 2015-03-08 DIAGNOSIS — N183 Chronic kidney disease, stage 3 unspecified: Secondary | ICD-10-CM

## 2015-03-08 DIAGNOSIS — I5023 Acute on chronic systolic (congestive) heart failure: Secondary | ICD-10-CM | POA: Diagnosis not present

## 2015-03-08 DIAGNOSIS — R1310 Dysphagia, unspecified: Secondary | ICD-10-CM

## 2015-03-08 DIAGNOSIS — F17210 Nicotine dependence, cigarettes, uncomplicated: Secondary | ICD-10-CM

## 2015-03-08 DIAGNOSIS — K219 Gastro-esophageal reflux disease without esophagitis: Secondary | ICD-10-CM | POA: Insufficient documentation

## 2015-03-08 DIAGNOSIS — Z794 Long term (current) use of insulin: Secondary | ICD-10-CM

## 2015-03-08 DIAGNOSIS — I5020 Unspecified systolic (congestive) heart failure: Secondary | ICD-10-CM | POA: Insufficient documentation

## 2015-03-08 DIAGNOSIS — E1165 Type 2 diabetes mellitus with hyperglycemia: Secondary | ICD-10-CM | POA: Diagnosis not present

## 2015-03-08 DIAGNOSIS — E1122 Type 2 diabetes mellitus with diabetic chronic kidney disease: Secondary | ICD-10-CM | POA: Insufficient documentation

## 2015-03-08 DIAGNOSIS — E1142 Type 2 diabetes mellitus with diabetic polyneuropathy: Secondary | ICD-10-CM

## 2015-03-08 DIAGNOSIS — R531 Weakness: Secondary | ICD-10-CM | POA: Insufficient documentation

## 2015-03-08 DIAGNOSIS — I5022 Chronic systolic (congestive) heart failure: Secondary | ICD-10-CM

## 2015-03-08 DIAGNOSIS — Z8249 Family history of ischemic heart disease and other diseases of the circulatory system: Secondary | ICD-10-CM

## 2015-03-08 DIAGNOSIS — I739 Peripheral vascular disease, unspecified: Secondary | ICD-10-CM | POA: Diagnosis not present

## 2015-03-08 DIAGNOSIS — IMO0002 Reserved for concepts with insufficient information to code with codable children: Secondary | ICD-10-CM

## 2015-03-08 DIAGNOSIS — Z79899 Other long term (current) drug therapy: Secondary | ICD-10-CM | POA: Insufficient documentation

## 2015-03-08 DIAGNOSIS — E780 Pure hypercholesterolemia: Secondary | ICD-10-CM

## 2015-03-08 DIAGNOSIS — K7460 Unspecified cirrhosis of liver: Secondary | ICD-10-CM

## 2015-03-08 DIAGNOSIS — D508 Other iron deficiency anemias: Secondary | ICD-10-CM

## 2015-03-08 DIAGNOSIS — I5041 Acute combined systolic (congestive) and diastolic (congestive) heart failure: Secondary | ICD-10-CM | POA: Diagnosis not present

## 2015-03-08 HISTORY — DX: Unspecified cirrhosis of liver: K74.60

## 2015-03-08 HISTORY — DX: Dysphagia, unspecified: R13.10

## 2015-03-08 MED ORDER — FERROUS SULFATE 325 (65 FE) MG PO TBEC: 325.0000 mg | DELAYED_RELEASE_TABLET | Freq: Every day | ORAL | Status: AC

## 2015-03-08 NOTE — Progress Notes (Signed)
Patient ID: Christian Wall, male   DOB: Dec 23, 1960, 54 y.o.   MRN: [id number] PCP: Dr Concepcion Elk Primary Cardiologist: Dr Gala Romney  HPI: [name] is a 54 yo male with h/o of systolic HF with EF 20-25%, polysubstance abuse, DM2, remote pancreatitis c/b prolonged hospitalization resulting in leg atrophy (?critical care myopathy) several years ago. Admitted in 5/16 with acute HF, cardiogenic shock and renal failure.   In 5/16, 2-D echocardiogram was performed and showed an EF of 20-25 percent. He was initially diuresed but developed cardiorenal syndrome and increasing creatinine (peak BUN/creatinine 72/3.22). Underwent RHC (LHC not done due to renal failure) which showed elevated filling pressures and cardiogenic shock. Started on milrinone and diuresed. Attempted to wean milrinone in hospital but this was unsuccessful. D/C weight 203.   Milrinone weaned off slowly in clinic (following co-oxs) at his request. Milrinone stopped on 01/25/15.  Admitted 01/30/15 for renal failure an(peak Cr 4.3) and hyperkalemia.He was felt to have ATN. He initially was treated with IVF and pressors. But these were stopped. With coox 70% on day of d/c, was not placed back on milrinone.He diuresed 6.3 L that admission with a discharge weight of 186 lbs.   Admitted 8/17 through 8/25 with volume overload, sus[pected ischemic leg  and low output heart failure. Vascular consulted.  Mixed venous saturation on admit was 50% so milrinone was restarted. Also had volume overload so he was diuresed with lasix drip. Initially his extremities improved with addition of milrinone but later RLE appeared ischemic. At that time he had CO2 arteriogram with 2 stents to common iliac artery. R and LLE post procedure remained warm with dopplered pulses. It was difficulty to fully diurese with RV failure and worsening renal function. Dopamine was added short term but he had no real improvement. Diuretics held even though CVP was high. Given RV failure, a  higher CVP was telt to be adequate and he was started back torsemide. Unfortunately we were unable to wean milrinone. Discharged on milrinone 0.375 mcg wit [facility] to manage.  Discharge weight was 196 pounds.   He returns for post hospital follow up. Overall feeling ok. Denies SOB/Orthopnea. Unable to weigh at home due to balance issues. Smoking 6-7 cigarettes per day. Taking all medications. Followed by [facility] for home milrinone.   STUDIES/PROCEDURES. 02/21/2015 ABI- aortoiliac occlusive disease and a stenosis in the right mid SFA 02/27/2015 ECHO -EF 15% restrictive physiology. High ventricular filling pressures.   02/26/2015 1. Ultrasound-guided access to the right common femoral artery 2. Aortogram with bilateral iliac arteriogram 3. Angioplasty and stenting of 2 common iliac artery stenoses ( 7 mm x 24 mm balloon expandable stent and 8 mm x 40 mm self-expanding stent) 4. Right femoral arteriogram with right lower extremity runoff using CO2  RHC 11/13/14 RA = 29 RV = 51/21/30 PA = 48/29 (38) PCW = 30 Fick cardiac output/index = 3.8/1.7 Thermo CO/CI = 3.7/1.7 PVR = 2.9 WU SVR = 1,502  Ao sat = 100% PA sat = 45%, 45% Ao = 131/86 (100)  PFTs FVC 2.75 FEV1 2.1 FEV1/FVC ratio 76 TLC 4.16 DLCO 15.95  Labs:  11/21/14 K 4.4 Creatinine 1.53 12/19/2014: K 4.5 Creatinine 1.21 Mag 1.2  12/26/14 Co ox 70.1, BNP 1110.4 12/28/14 K 4.6, Creatinine 1.58, Mg 1.5 01/02/15 co-ox 57% Mag 1.1 Creatinine 1.47  01/2015: K 3.8 Creatinine 1.68 mag 1.1.  Co-ox 75%.  02/28/2014: K 4.2 Creatinine 2.6    ROS: All systems negative except as listed in HPI, PMH and Problem List.  SH:  Social History   Social History  . Marital Status: Married    Spouse Name: N/A  . Number of Children: N/A  . Years of Education: N/A   Occupational History  . disabled    Social History Main Topics  . Smoking status: Current Every Day Smoker -- 1.00 packs/day for 30 years    Types: Cigarettes  . Smokeless tobacco: Never  Used  . Alcohol Use: No     Comment: Quit after his brother's death in 11/20/2005 or 8   . Drug Use: No  . Sexual Activity: Yes   Other Topics Concern  . Not on file   Social History Narrative   Lives in [place] with wife, uses crutches or wheelchair to get around    FH:  Family History  Problem Relation Age of Onset  . Diabetes Mellitus II Sister   . CAD Brother   . Cancer Mother     ? type   . Cancer Father     ? type    Past Medical History  Diagnosis Date  . Diabetes mellitus   . Atrophy of calf muscles   . Depression   . GERD (gastroesophageal reflux disease)   . Peripheral neuropathy   . Hypercholesteremia   . Hx of tracheostomy   . Chronic systolic CHF (congestive heart failure), NYHA class 3     Current Outpatient Prescriptions  Medication Sig Dispense Refill  . acetaminophen (TYLENOL) 500 MG tablet Take 2 tablets (1,000 mg total) by mouth every 6 (six) hours as needed for mild pain or headache. 30 tablet 0  . atorvastatin (LIPITOR) 40 MG tablet Take 1 tablet (40 mg total) by mouth daily at 6 PM. 30 tablet 6  . bisacodyl (DULCOLAX) 5 MG EC tablet Take 5 mg by mouth daily as needed for mild constipation or moderate constipation.    Marland Kitchen buPROPion (WELLBUTRIN SR) 150 MG 12 hr tablet Take 150 mg by mouth 2 (two) times daily.  3  . clopidogrel (PLAVIX) 75 MG tablet Take 1 tablet (75 mg total) by mouth daily. 30 tablet 6  . CVS VITAMIN B12 1000 MCG tablet Take 1,000 mcg by mouth daily.  5  . digoxin (LANOXIN) 0.125 MG tablet Take 0.5 tablets (0.0625 mg total) by mouth daily. 30 tablet 6  . DULoxetine (CYMBALTA) 30 MG capsule Take 1 capsule (30 mg total) by mouth daily. 30 capsule 0  . esomeprazole (NEXIUM) 40 MG capsule Take 40 mg by mouth daily before breakfast.    . hydrALAZINE (APRESOLINE) 25 MG tablet Take 1 tablet (25 mg total) by mouth every 8 (eight) hours. 270 tablet 11  . insulin aspart (NOVOLOG) 100 UNIT/ML injection Inject 0-15 Units into the skin 3 (three)  times daily with meals. CBG 70 - 120: 0 units CBG 121 - 150: 2 units CBG 151 - 200: 3 units CBG 201 - 250: 5 units CBG 251 - 300: 8 units CBG 301 - 350: 11 units CBG 351 - 400: 15 units (Patient taking differently: Inject 0-15 Units into the skin 3 (three) times daily as needed for high blood sugar. CBG 70 - 120: 0 units CBG 121 - 150: 2 units CBG 151 - 200: 3 units CBG 201 - 250: 5 units CBG 251 - 300: 8 units CBG 301 - 350: 11 units CBG 351 - 400: 15 units) 10 mL 11  . insulin glargine (LANTUS) 100 UNIT/ML injection Inject 0.3-0.4 mLs (30-40 Units total) into the skin 2 (two)  times daily. 40 U am, 30 U pm (Patient taking differently: Inject 30-50 Units into the skin 2 (two) times daily. 30 U am, 50 U pm) 10 mL 11  . isosorbide mononitrate (IMDUR) 30 MG 24 hr tablet Take 1 tablet (30 mg total) by mouth daily. 30 tablet 0  . lipase/protease/amylase (CREON-10/PANCREASE) 12000 UNITS CPEP Take 1-3 capsules by mouth 2 (two) times daily. 3 capsules before meal, and 1 capsule before snack    . magnesium oxide (MAG-OX) 400 (241.3 MG) MG tablet Take 400 mg by mouth 2 (two) times daily.  3  . milrinone (PRIMACOR) 20 MG/100ML SOLN infusion Inject 31.8375 mcg/min into the vein continuous. Per [facility]. 12 months 100 mL 12  . Olopatadine HCl (PATADAY) 0.2 % SOLN Apply 1 drop to eye daily as needed. For allergies    . pregabalin (LYRICA) 150 MG capsule Take 150 mg by mouth 2 (two) times daily.    Marland Kitchen tiZANidine (ZANAFLEX) 4 MG tablet Take 4 mg by mouth 2 (two) times daily as needed for muscle spasms.    Marland Kitchen torsemide (DEMADEX) 20 MG tablet Take 1 tablet (20 mg total) by mouth 2 (two) times daily. 60 tablet 0  . traMADol (ULTRAM) 50 MG tablet Take 50 mg by mouth every 6 (six) hours as needed for moderate pain.     . VENTOLIN HFA 108 (90 BASE) MCG/ACT inhaler Inhale 1-2 puffs into the lungs every 6 (six) hours as needed for wheezing or shortness of breath.   5   No current facility-administered medications for this  encounter.   BP 108/66 mmHg  Pulse 84  Wt 192 lb 6.4 oz (87.272 kg)  SpO2 98%  PHYSICAL EXAM:  General: . No resp difficulty. Arrived in wheel chair. Wife present  HEENT: normal Neck: supple. JVP ~10 Carotids 2+ bilaterally; no bruits. No lymphadenopathy or thryomegaly appreciated. Cor: PMI laterally displaced. Regular,  no S3/S4, 2/6 MR Lungs: clear Abdomen: soft, obese nontender, nondistended. No hepatosplenomegaly. No bruits or masses. Good bowel sounds. Extremities: very cool. n0 clubbing, rash, R and LLE  1+ edema.   RUE PICC. Neuro: alert & orientedx3, cranial nerves grossly intact. Moves all 4 extremities w/o difficulty. Weak in bilateral legs.  Affect pleasant.  ASSESSMENT & PLAN: 1. 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   - NYHA IIIb on milrinone 0.375 mcg. Volume status elevated but this is as good as we can get him with RV failure. [facility] following fo rhome milrinone and weekly BMET.   - Continue torsemide 20 mg twice a day.  - Hold off on ACE, spiro and b-blocker due to shock and recent renal failure.  - Continue hydralazin/nitrates at current dose.  - If renal function remains stable will consider coronary angiography in the near future as we have never ruled out CAD/ischemic cardiomyopathy.  - Will need repeat echo in next 1-2 month.  At that time, will need to decide on CRT-D versus ICD (QRS 156 msec but IVCD and not true LBBB).  - Likely not candidate for VAD or transplant due to limited mobility with legs 2. CKD, stage 3: BMET weekly. Follow closely.  3. 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  4.  DM II 5. Anemia, iron-deficiency- Continue iron.  6. Tobacco use: Smoked over 30 years. Continues to smoke 1/2 ppd. Encouraged to quit completely.  7.PAD- 8/22 Arteriogram wit stent x 2 R common iliac artery. No dye used. Continue plavix and statin . Has  follow up with Vascular.  8.  Hypomagnesium: Continue current magnesium dose. Recheck weekly    Follow up in 2 weeks.   Jatoria Kneeland PA-C  03/08/2015

## 2015-03-08 NOTE — Patient Instructions (Signed)
START ferrous sulfate (iron supplement) 325 mg table once daily with breakfast. Pharmacy: [id number] [place] Otto, [place] - [address] [Patient Preferred] [phone]  Follow up 2 weeks.  Do the following things EVERYDAY: 1) Weigh yourself in the morning before breakfast. Write it down and keep it in a log. 2) Take your medicines as prescribed 3) Eat low salt foods-Limit salt (sodium) to 2000 mg per day.  4) Stay as active as you can everyday 5) Limit all fluids for the day to less than 2 liters

## 2015-03-08 NOTE — Progress Notes (Signed)
Advanced Heart Failure Medication Review by a Pharmacist  Does the patient  feel that his/her medications are working for him/her?  yes  Has the patient been experiencing any side effects to the medications prescribed?  no  Does the patient measure his/her own blood pressure or blood glucose at home?  yes   Does the patient have any problems obtaining medications due to transportation or finances?   no  Understanding of regimen: good Understanding of indications: good Potential of compliance: excellent    Pharmacist comments:  Christian Wall is a pleasant 54 yo M presenting with his wife and an up-to-date medication list. He and his wife seem to have a good understanding of his regimen and report excellent compliance with his medications. They did not have any specific medication-related questions or concerns at this time.  Tyler Deis. Bonnye Fava, PharmD, BCPS, CPP Clinical Pharmacist Pager: [phone] Phone: [phone] 03/08/2015 11:12 AM

## 2015-03-11 ENCOUNTER — Emergency Department (HOSPITAL_COMMUNITY): Payer: Medicare Other

## 2015-03-11 ENCOUNTER — Inpatient Hospital Stay (HOSPITAL_COMMUNITY)
Admission: EM | Admit: 2015-03-11 | Discharge: 2015-03-13 | DRG: 291 | Disposition: A | Discharge status: Home / Self Care | Payer: Medicare Other | Attending: Internal Medicine | Admitting: Internal Medicine

## 2015-03-11 ENCOUNTER — Encounter (HOSPITAL_COMMUNITY): Payer: Self-pay | Admitting: Emergency Medicine

## 2015-03-11 DIAGNOSIS — K219 Gastro-esophageal reflux disease without esophagitis: Secondary | ICD-10-CM | POA: Diagnosis present

## 2015-03-11 DIAGNOSIS — N183 Chronic kidney disease, stage 3 (moderate): Secondary | ICD-10-CM | POA: Diagnosis present

## 2015-03-11 DIAGNOSIS — J9602 Acute respiratory failure with hypercapnia: Secondary | ICD-10-CM | POA: Diagnosis present

## 2015-03-11 DIAGNOSIS — E780 Pure hypercholesterolemia: Secondary | ICD-10-CM | POA: Diagnosis present

## 2015-03-11 DIAGNOSIS — Z87898 Personal history of other specified conditions: Secondary | ICD-10-CM | POA: Diagnosis not present

## 2015-03-11 DIAGNOSIS — J9601 Acute respiratory failure with hypoxia: Secondary | ICD-10-CM | POA: Diagnosis not present

## 2015-03-11 DIAGNOSIS — E162 Hypoglycemia, unspecified: Secondary | ICD-10-CM

## 2015-03-11 DIAGNOSIS — Z7951 Long term (current) use of inhaled steroids: Secondary | ICD-10-CM | POA: Diagnosis not present

## 2015-03-11 DIAGNOSIS — D509 Iron deficiency anemia, unspecified: Secondary | ICD-10-CM | POA: Diagnosis present

## 2015-03-11 DIAGNOSIS — I998 Other disorder of circulatory system: Secondary | ICD-10-CM

## 2015-03-11 DIAGNOSIS — Z79899 Other long term (current) drug therapy: Secondary | ICD-10-CM | POA: Diagnosis not present

## 2015-03-11 DIAGNOSIS — F17210 Nicotine dependence, cigarettes, uncomplicated: Secondary | ICD-10-CM | POA: Diagnosis present

## 2015-03-11 DIAGNOSIS — Z794 Long term (current) use of insulin: Secondary | ICD-10-CM

## 2015-03-11 DIAGNOSIS — L8990 Pressure ulcer of unspecified site, unspecified stage: Secondary | ICD-10-CM | POA: Insufficient documentation

## 2015-03-11 DIAGNOSIS — Z993 Dependence on wheelchair: Secondary | ICD-10-CM

## 2015-03-11 DIAGNOSIS — Z7902 Long term (current) use of antithrombotics/antiplatelets: Secondary | ICD-10-CM | POA: Diagnosis not present

## 2015-03-11 DIAGNOSIS — I739 Peripheral vascular disease, unspecified: Secondary | ICD-10-CM | POA: Diagnosis present

## 2015-03-11 DIAGNOSIS — E876 Hypokalemia: Secondary | ICD-10-CM | POA: Diagnosis present

## 2015-03-11 DIAGNOSIS — I5041 Acute combined systolic (congestive) and diastolic (congestive) heart failure: Secondary | ICD-10-CM | POA: Diagnosis present

## 2015-03-11 DIAGNOSIS — E11649 Type 2 diabetes mellitus with hypoglycemia without coma: Secondary | ICD-10-CM | POA: Diagnosis present

## 2015-03-11 DIAGNOSIS — E871 Hypo-osmolality and hyponatremia: Secondary | ICD-10-CM | POA: Diagnosis present

## 2015-03-11 DIAGNOSIS — I5023 Acute on chronic systolic (congestive) heart failure: Secondary | ICD-10-CM | POA: Diagnosis present

## 2015-03-11 DIAGNOSIS — F329 Major depressive disorder, single episode, unspecified: Secondary | ICD-10-CM | POA: Diagnosis present

## 2015-03-11 LAB — CBC WITH DIFFERENTIAL/PLATELET
BASOS ABS: 0 10*3/uL (ref 0.0–0.1)
BASOS PCT: 1 % (ref 0–1)
EOS ABS: 0.1 10*3/uL (ref 0.0–0.7)
Eosinophils Relative: 1 % (ref 0–5)
HCT: 31.1 % — ABNORMAL LOW (ref 39.0–52.0)
HEMOGLOBIN: 9.7 g/dL — AB (ref 13.0–17.0)
Lymphocytes Relative: 6 % — ABNORMAL LOW (ref 12–46)
Lymphs Abs: 0.5 10*3/uL — ABNORMAL LOW (ref 0.7–4.0)
MCH: 21.8 pg — ABNORMAL LOW (ref 26.0–34.0)
MCHC: 31.2 g/dL (ref 30.0–36.0)
MCV: 69.9 fL — ABNORMAL LOW (ref 78.0–100.0)
Monocytes Absolute: 0.5 10*3/uL (ref 0.1–1.0)
Monocytes Relative: 7 % (ref 3–12)
NEUTROS PCT: 85 % — AB (ref 43–77)
Neutro Abs: 6.2 10*3/uL (ref 1.7–7.7)
Platelets: 286 10*3/uL (ref 150–400)
RBC: 4.45 MIL/uL (ref 4.22–5.81)
RDW: 19.4 % — ABNORMAL HIGH (ref 11.5–15.5)
WBC: 7.3 10*3/uL (ref 4.0–10.5)

## 2015-03-11 LAB — CBG MONITORING, ED
GLUCOSE-CAPILLARY: 146 mg/dL — AB (ref 65–99)
Glucose-Capillary: 162 mg/dL — ABNORMAL HIGH (ref 65–99)

## 2015-03-11 LAB — I-STAT ARTERIAL BLOOD GAS, ED
Acid-Base Excess: 7 mmol/L — ABNORMAL HIGH (ref 0.0–2.0)
Bicarbonate: 33.7 mEq/L — ABNORMAL HIGH (ref 20.0–24.0)
O2 Saturation: 89 %
TCO2: 35 mmol/L (ref 0–100)
pCO2 arterial: 49.5 mmHg — ABNORMAL HIGH (ref 35.0–45.0)
pH, Arterial: 7.439 (ref 7.350–7.450)
pO2, Arterial: 55 mmHg — ABNORMAL LOW (ref 80.0–100.0)

## 2015-03-11 LAB — COMPREHENSIVE METABOLIC PANEL
ALK PHOS: 413 U/L — AB (ref 38–126)
ALT: 34 U/L (ref 17–63)
AST: 56 U/L — ABNORMAL HIGH (ref 15–41)
Albumin: 2.7 g/dL — ABNORMAL LOW (ref 3.5–5.0)
Anion gap: 8 (ref 5–15)
BUN: 43 mg/dL — ABNORMAL HIGH (ref 6–20)
CALCIUM: 8.9 mg/dL (ref 8.9–10.3)
CO2: 34 mmol/L — ABNORMAL HIGH (ref 22–32)
CREATININE: 1.58 mg/dL — AB (ref 0.61–1.24)
Chloride: 94 mmol/L — ABNORMAL LOW (ref 101–111)
GFR, EST AFRICAN AMERICAN: 56 mL/min — AB (ref >60)
GFR, EST NON AFRICAN AMERICAN: 48 mL/min — AB (ref >60)
Glucose, Bld: 21 mg/dL — CL (ref 65–99)
Potassium: 3.4 mmol/L — ABNORMAL LOW (ref 3.5–5.1)
Sodium: 136 mmol/L (ref 135–145)
Total Bilirubin: 1.5 mg/dL — ABNORMAL HIGH (ref 0.3–1.2)
Total Protein: 7.1 g/dL (ref 6.5–8.1)

## 2015-03-11 LAB — GLUCOSE, CAPILLARY
GLUCOSE-CAPILLARY: 93 mg/dL (ref 65–99)
GLUCOSE-CAPILLARY: 62 mg/dL — AB (ref 65–99)
GLUCOSE-CAPILLARY: 175 mg/dL — AB (ref 65–99)
Glucose-Capillary: 131 mg/dL — ABNORMAL HIGH (ref 65–99)
Glucose-Capillary: 145 mg/dL — ABNORMAL HIGH (ref 65–99)
Glucose-Capillary: 35 mg/dL — CL (ref 65–99)
Glucose-Capillary: 53 mg/dL — ABNORMAL LOW (ref 65–99)
Glucose-Capillary: 103 mg/dL — ABNORMAL HIGH (ref 65–99)

## 2015-03-11 LAB — TROPONIN I: TROPONIN I: 0.06 ng/mL — AB (ref <0.031)

## 2015-03-11 LAB — CARBOXYHEMOGLOBIN
CARBOXYHEMOGLOBIN: 3.7 % — AB (ref 0.5–1.5)
METHEMOGLOBIN: 0.8 % (ref 0.0–1.5)
O2 Saturation: 57.9 %
TOTAL HEMOGLOBIN: 9.1 g/dL — AB (ref 13.5–18.0)

## 2015-03-11 LAB — MAGNESIUM: Magnesium: 2 mg/dL (ref 1.7–2.4)

## 2015-03-11 LAB — PROTIME-INR
INR: 1.3 (ref 0.00–1.49)
PROTHROMBIN TIME: 16.3 s — AB (ref 11.6–15.2)

## 2015-03-11 LAB — BRAIN NATRIURETIC PEPTIDE: B Natriuretic Peptide: 1184.5 pg/mL — ABNORMAL HIGH (ref 0.0–100.0)

## 2015-03-11 LAB — AMMONIA: Ammonia: 26 umol/L (ref 9–35)

## 2015-03-11 LAB — DIGOXIN LEVEL: DIGOXIN LVL: 1 ng/mL (ref 0.8–2.0)

## 2015-03-11 MED ORDER — DEXTROSE 50 % IV SOLN: 25.0000 mL | Freq: Once | INTRAVENOUS | Status: AC

## 2015-03-11 MED ORDER — BISACODYL 5 MG PO TBEC: 5.0000 mg | DELAYED_RELEASE_TABLET | Freq: Every day | ORAL | Status: DC | PRN

## 2015-03-11 MED ORDER — POTASSIUM CHLORIDE 10 MEQ/100ML IV SOLN
10.0000 meq | Freq: Once | INTRAVENOUS | Status: AC
  Administered 2015-03-11: 10 meq via INTRAVENOUS
  Filled 2015-03-11: qty 100

## 2015-03-11 MED ORDER — MILRINONE IN DEXTROSE 20 MG/100ML IV SOLN
0.3750 ug/kg/min | INTRAVENOUS | Status: DC
  Administered 2015-03-11 – 2015-03-13 (×4): 0.375 ug/kg/min via INTRAVENOUS
  Filled 2015-03-11 (×5): qty 100

## 2015-03-11 MED ORDER — DEXTROSE 10 % IV SOLN
INTRAVENOUS | Status: DC
  Administered 2015-03-11: 50 mL/h via INTRAVENOUS

## 2015-03-11 MED ORDER — ATORVASTATIN CALCIUM 40 MG PO TABS
40.0000 mg | ORAL_TABLET | Freq: Every day | ORAL | Status: DC
  Administered 2015-03-11 – 2015-03-12 (×2): 40 mg via ORAL
  Filled 2015-03-11 (×2): qty 1

## 2015-03-11 MED ORDER — FERROUS SULFATE 325 (65 FE) MG PO TABS
325.0000 mg | ORAL_TABLET | Freq: Every day | ORAL | Status: DC
  Administered 2015-03-12 – 2015-03-13 (×2): 325 mg via ORAL
  Filled 2015-03-11 (×2): qty 1

## 2015-03-11 MED ORDER — MAGNESIUM OXIDE 400 (241.3 MG) MG PO TABS
400.0000 mg | ORAL_TABLET | Freq: Two times a day (BID) | ORAL | Status: DC
  Administered 2015-03-11 – 2015-03-13 (×4): 400 mg via ORAL
  Filled 2015-03-11 (×4): qty 1

## 2015-03-11 MED ORDER — FUROSEMIDE 10 MG/ML IJ SOLN
60.0000 mg | Freq: Once | INTRAMUSCULAR | Status: AC
  Administered 2015-03-11: 60 mg via INTRAVENOUS
  Filled 2015-03-11: qty 6

## 2015-03-11 MED ORDER — HEPARIN SODIUM (PORCINE) 5000 UNIT/ML IJ SOLN
5000.0000 [IU] | Freq: Three times a day (TID) | INTRAMUSCULAR | Status: DC
  Administered 2015-03-11 – 2015-03-13 (×6): 5000 [IU] via SUBCUTANEOUS
  Filled 2015-03-11 (×6): qty 1

## 2015-03-11 MED ORDER — BUPROPION HCL ER (SR) 150 MG PO TB12
150.0000 mg | ORAL_TABLET | Freq: Two times a day (BID) | ORAL | Status: DC
  Administered 2015-03-11 – 2015-03-13 (×4): 150 mg via ORAL
  Filled 2015-03-11 (×4): qty 1

## 2015-03-11 MED ORDER — DEXTROSE 50 % IV SOLN
INTRAVENOUS | Status: AC
  Filled 2015-03-11: qty 50

## 2015-03-11 MED ORDER — DEXTROSE 50 % IV SOLN
50.0000 mL | Freq: Once | INTRAVENOUS | Status: AC
  Administered 2015-03-11: 50 mL via INTRAVENOUS

## 2015-03-11 MED ORDER — POTASSIUM CHLORIDE 10 MEQ/100ML IV SOLN
10.0000 meq | INTRAVENOUS | Status: AC
  Administered 2015-03-11 (×3): 10 meq via INTRAVENOUS
  Filled 2015-03-11 (×3): qty 100

## 2015-03-11 MED ORDER — DEXTROSE 50 % IV SOLN
INTRAVENOUS | Status: AC
  Administered 2015-03-11: 25 mL
  Filled 2015-03-11: qty 50

## 2015-03-11 MED ORDER — INSULIN ASPART 100 UNIT/ML ~~LOC~~ SOLN
0.0000 [IU] | Freq: Three times a day (TID) | SUBCUTANEOUS | Status: DC
  Administered 2015-03-12: 1 [IU] via SUBCUTANEOUS
  Administered 2015-03-13: 3 [IU] via SUBCUTANEOUS
  Administered 2015-03-13: 5 [IU] via SUBCUTANEOUS

## 2015-03-11 MED ORDER — FUROSEMIDE 10 MG/ML IJ SOLN
10.0000 mg/h | INTRAVENOUS | Status: DC
  Administered 2015-03-11: 10 mg/h via INTRAVENOUS
  Filled 2015-03-11 (×5): qty 25

## 2015-03-11 MED ORDER — FERROUS SULFATE 325 (65 FE) MG PO TBEC: 325.0000 mg | DELAYED_RELEASE_TABLET | Freq: Every day | ORAL | Status: DC

## 2015-03-11 MED ORDER — MILRINONE IN DEXTROSE 20 MG/100ML IV SOLN: 0.3750 ug/kg/min | INTRAVENOUS | Status: DC

## 2015-03-11 MED ORDER — DIGOXIN 125 MCG PO TABS
0.0625 mg | ORAL_TABLET | Freq: Every day | ORAL | Status: DC
  Administered 2015-03-11 – 2015-03-12 (×2): 0.0625 mg via ORAL
  Filled 2015-03-11 (×2): qty 1

## 2015-03-11 MED ORDER — CLOPIDOGREL BISULFATE 75 MG PO TABS
75.0000 mg | ORAL_TABLET | Freq: Every day | ORAL | Status: DC
  Administered 2015-03-11 – 2015-03-13 (×3): 75 mg via ORAL
  Filled 2015-03-11 (×3): qty 1

## 2015-03-11 MED ORDER — POTASSIUM CHLORIDE CRYS ER 20 MEQ PO TBCR
40.0000 meq | EXTENDED_RELEASE_TABLET | Freq: Once | ORAL | Status: DC
  Filled 2015-03-11: qty 2

## 2015-03-11 MED ORDER — HYDRALAZINE HCL 25 MG PO TABS
25.0000 mg | ORAL_TABLET | Freq: Three times a day (TID) | ORAL | Status: DC
  Administered 2015-03-11 – 2015-03-13 (×5): 25 mg via ORAL
  Filled 2015-03-11 (×5): qty 1

## 2015-03-11 NOTE — H&P (Signed)
Chief Complaint:  Dyspnea  Cardiologist: Bensimhon  HPI:  This is a 54 y.o. 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 Admitted in 5/16 with acute HF, cardiogenic shock and renal failure, when milrinone was initially started. Weaned off milrinone in early July, admitted July 26 with renal failure and is back on milrinone 0.375 mcg/kg/min since August 25 discharge. No improvement reported when dopamine was added a couple of weeks ago."Dry weight" estimated around 186 in July, was 196 at last DC. Likely not candidate for VAD or transplant due to limited mobility and leg atrophy/weakness. Had stent x  2 to R common iliac artery for ischemic leg in August. Still smokes.  Presents today with fatigue, dyspnea and hypoxemia. Subjectively improved with O2, but CXR shows edema and oxygenation remains marginal. His sodium is now normal and creatinine has improved (2.6 at DC on 8/25, 1.6 today).  While in ED became increasingly somnolent. Glucose level was 21, improved to 140 after dextrose, but he remained sleepy. ABG shows hypercarbia and hypoxia. Placed on BiPAP and he is improving.  His left foot is cool, but according to his wife it is improving. Small wounds are getting better.  PMHx:  Past Medical History  Diagnosis Date  . Diabetes mellitus   . Atrophy of calf muscles   . Depression   . GERD (gastroesophageal reflux disease)   . Peripheral neuropathy   . Hypercholesteremia   . Hx of tracheostomy   . Chronic systolic CHF (congestive heart failure), NYHA class 3     Past Surgical History  Procedure Laterality Date  . Pancreas surgery  2001    hx pancreatitis  . Anterior cervical decomp/discectomy fusion  04/13/2012    Procedure: ANTERIOR CERVICAL DECOMPRESSION/DISCECTOMY FUSION 1 LEVEL;   Surgeon: Temple Pacini, MD;  Location: [facility]UR[facility];  Service: Neurosurgery;  Laterality: N/A;  Cervcial Five-Six Anterior Cervical Decompression and Fusion with Allograft and Plating  . Cardiac catheterization N/A 11/15/2014    Procedure: Right Heart Cath;  Surgeon: Dolores Patty, MD;  Location: [facility] INVASIVE CV LAB;  Service: Cardiovascular;  Laterality: N/A;  . Peripheral vascular catheterization N/A 02/26/2015    Procedure: Abdominal Aortogram;  Surgeon: Chuck Hint, MD;  Location: [facility] INVASIVE CV LAB;  Service: Cardiovascular;  Laterality: N/A;    FAMHx:  Family History  Problem Relation Age of Onset  . Diabetes Mellitus II Sister   . CAD Brother   . Cancer Mother     ? type   . Cancer Father     ? type    SOCHx:   reports that he has been smoking Cigarettes.  He has a 30 pack-year smoking history. He has never used smokeless tobacco. He reports that he does not drink alcohol or use illicit drugs.  ALLERGIES:  No Known Allergies  ROS: Review of systems not obtained due to patient factors.  HOME MEDS: No current facility-administered medications on file prior to encounter.   Current Outpatient Prescriptions on File Prior to Encounter  Medication Sig Dispense Refill  . acetaminophen (TYLENOL) 500 MG tablet Take 2 tablets (1,000 mg total) by mouth every 6 (six) hours as needed for mild pain or headache. 30 tablet 0  . atorvastatin (LIPITOR) 40 MG tablet Take 1 tablet (40 mg total) by mouth daily at 6 PM. 30 tablet 6  .  bisacodyl (DULCOLAX) 5 MG EC tablet Take 5 mg by mouth daily as needed for mild constipation or moderate constipation.    Marland Kitchen buPROPion (WELLBUTRIN SR) 150 MG 12 hr tablet Take 150 mg by mouth 2 (two) times daily.  3  . clopidogrel (PLAVIX) 75 MG tablet Take 1 tablet (75 mg total) by mouth daily. 30 tablet 6  . CVS VITAMIN B12 1000 MCG tablet Take 1,000 mcg by mouth daily.  5  . digoxin (LANOXIN) 0.125 MG tablet Take 0.5 tablets (0.0625 mg total) by mouth  daily. 30 tablet 6  . DULoxetine (CYMBALTA) 30 MG capsule Take 1 capsule (30 mg total) by mouth daily. 30 capsule 0  . esomeprazole (NEXIUM) 40 MG capsule Take 40 mg by mouth daily before breakfast.    . ferrous sulfate 325 (65 FE) MG EC tablet Take 1 tablet (325 mg total) by mouth daily with breakfast. 30 tablet 6  . hydrALAZINE (APRESOLINE) 25 MG tablet Take 1 tablet (25 mg total) by mouth every 8 (eight) hours. 270 tablet 11  . insulin aspart (NOVOLOG) 100 UNIT/ML injection Inject 0-15 Units into the skin 3 (three) times daily with meals. CBG 70 - 120: 0 units CBG 121 - 150: 2 units CBG 151 - 200: 3 units CBG 201 - 250: 5 units CBG 251 - 300: 8 units CBG 301 - 350: 11 units CBG 351 - 400: 15 units (Patient taking differently: Inject 0-15 Units into the skin 3 (three) times daily as needed for high blood sugar. CBG 70 - 120: 0 units CBG 121 - 150: 2 units CBG 151 - 200: 3 units CBG 201 - 250: 5 units CBG 251 - 300: 8 units CBG 301 - 350: 11 units CBG 351 - 400: 15 units) 10 mL 11  . insulin glargine (LANTUS) 100 UNIT/ML injection Inject 0.3-0.4 mLs (30-40 Units total) into the skin 2 (two) times daily. 40 U am, 30 U pm (Patient taking differently: Inject 30-50 Units into the skin 2 (two) times daily. 30 U am, 50 U pm) 10 mL 11  . isosorbide mononitrate (IMDUR) 30 MG 24 hr tablet Take 1 tablet (30 mg total) by mouth daily. 30 tablet 0  . lipase/protease/amylase (CREON-10/PANCREASE) 12000 UNITS CPEP Take 1-3 capsules by mouth 2 (two) times daily. 3 capsules before meal, and 1 capsule before snack    . magnesium oxide (MAG-OX) 400 (241.3 MG) MG tablet Take 400 mg by mouth 2 (two) times daily.  3  . milrinone (PRIMACOR) 20 MG/100ML SOLN infusion Inject 31.8375 mcg/min into the vein continuous. [facility]. 12 months 100 mL 12  . Olopatadine HCl (PATADAY) 0.2 % SOLN Apply 1 drop to eye daily as needed. For allergies    . pregabalin (LYRICA) 150 MG capsule Take 150 mg by mouth 2 (two) times daily.      Marland Kitchen tiZANidine (ZANAFLEX) 4 MG tablet Take 4 mg by mouth 2 (two) times daily as needed for muscle spasms.    Marland Kitchen torsemide (DEMADEX) 20 MG tablet Take 1 tablet (20 mg total) by mouth 2 (two) times daily. 60 tablet 0  . traMADol (ULTRAM) 50 MG tablet Take 50 mg by mouth every 6 (six) hours as needed for moderate pain.     . VENTOLIN HFA 108 (90 BASE) MCG/ACT inhaler Inhale 1-2 puffs into the lungs every 6 (six) hours as needed for wheezing or shortness of breath.   5     LABS/IMAGING: Results for orders placed or performed during the hospital  encounter of 03/11/15 (from the past 48 hour(s))  CBC with Differential/Platelet     Status: Abnormal   Collection Time: 03/11/15  9:10 AM  Result Value Ref Range   WBC 7.3 4.0 - 10.5 K/uL   RBC 4.45 4.22 - 5.81 MIL/uL   Hemoglobin 9.7 (L) 13.0 - 17.0 g/dL   HCT 16.1 (L) 09.6 - 04.5 %   MCV 69.9 (L) 78.0 - 100.0 fL   MCH 21.8 (L) 26.0 - 34.0 pg   MCHC 31.2 30.0 - 36.0 g/dL   RDW 40.9 (H) 81.1 - 91.4 %   Platelets 286 150 - 400 K/uL   Neutrophils Relative % 85 (H) 43 - 77 %   Neutro Abs 6.2 1.7 - 7.7 K/uL   Lymphocytes Relative 6 (L) 12 - 46 %   Lymphs Abs 0.5 (L) 0.7 - 4.0 K/uL   Monocytes Relative 7 3 - 12 %   Monocytes Absolute 0.5 0.1 - 1.0 K/uL   Eosinophils Relative 1 0 - 5 %   Eosinophils Absolute 0.1 0.0 - 0.7 K/uL   Basophils Relative 1 0 - 1 %   Basophils Absolute 0.0 0.0 - 0.1 K/uL  Digoxin level     Status: None   Collection Time: 03/11/15  9:10 AM  Result Value Ref Range   Digoxin Level 1.0 0.8 - 2.0 ng/mL  I-Stat Arterial Blood Gas, ED - (order at [facility] and [facility] only)     Status: Abnormal   Collection Time: 03/11/15 10:34 AM  Result Value Ref Range   pH, Arterial 7.439 7.350 - 7.450   pCO2 arterial 49.5 (H) 35.0 - 45.0 mmHg   pO2, Arterial 55.0 (L) 80.0 - 100.0 mmHg   Bicarbonate 33.7 (H) 20.0 - 24.0 mEq/L   TCO2 35 0 - 100 mmol/L   O2 Saturation 89.0 %   Acid-Base Excess 7.0 (H) 0.0 - 2.0 mmol/L   Patient temperature 97.6 F     Collection site RADIAL, ALLEN'S TEST ACCEPTABLE    Drawn by Operator    Sample type ARTERIAL    Dg Chest 2 View  03/11/2015   CLINICAL DATA:  Acute shortness of breath.  History of CHF.  EXAM: CHEST  2 VIEW  COMPARISON:  02/22/2015 and prior exams  FINDINGS: Cardiomegaly and mild interstitial pulmonary edema noted, increased from the prior study.  A right PICC line is noted with tip overlying the mid mid-upper SVC.  There is no evidence of pneumothorax or pleural effusion.  No acute bony abnormalities are noted.  IMPRESSION: Cardiomegaly with mild-moderate interstitial pulmonary edema.   Electronically Signed   By: Harmon Pier M.D.   On: 03/11/2015 08:48    VITALS: Blood pressure 120/73, pulse 83, temperature 97.6 F (36.4 C), temperature source Oral, resp. rate 11, SpO2 93 %.  EXAM:  General: lethargic, arousable with repeated stimulation Head: no evidence of trauma, PERRL, EOMI, no exophtalmos or lid lag, no myxedema, no xanthelasma; normal ears, nose and oropharynx Neck: 8-10 cm jugular venous pulsations and no additional hepatojugular reflux; brisk carotid pulses without delay and no carotid bruits Chest: diminished throughout, bibasilar rales, no signs of consolidation by percussion or palpation, normal fremitus, symmetrical and full respiratory excursions Cardiovascular: laterally displaced and effeaced apical impulse, regular rhythm, normal first heart sound and loud second heart sounds, no rubs, +S3 gallop, 1-2/6 LLSB holosystolicmurmur Abdomen: no tenderness or distention, no masses by palpation, no abnormal pulsatility or arterial bruits, normal bowel sounds, no hepatosplenomegaly Extremities: cool right foot with  cyanotic toes and no palpable pulses, small superficial wound on dorsum of foot Neurological: lethargic/obtunded, but otherwise grossly nonfocal   IMPRESSION:  1. Acute respiratory failure - hypoxic and hypercarbic, due to pulmonary edema +/- hypoglycemia 2. Acute on  chronic stage D CHF - very poor prognosis, already on inotropes at home; furosemide drip, consider adding dopamine, but this was ineffective before. Not on ACEi or beta blockers due to low output state/hypotension/renal failure.  3. 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  5. Anemia, iron-deficiency 6. Tobacco use: Smoked over 30 years. Continues to smoke 1/2 ppd. - Had considered wellbutrin in the past.  7. Limb ischemia: He has critical limb ischemia in setting of low cardiac output and PAD. The foot is still cool and concerning, but improved according to Mrs. Talmadge 8. DM, currently hypoglycemic (sign of infection? Worsening liver status? Just because he has not eaten?) - feed as soon as he is alert enough, monitor frequently  He is critically ill and prognosis is poor.  Thurmon Fair, MD, [facility] [phone] office [phone] pager  03/11/2015, 10:52 AM

## 2015-03-11 NOTE — ED Notes (Signed)
Notified Dr Ethelda Chick of glucose 20.

## 2015-03-11 NOTE — ED Notes (Signed)
Per IV team ok to give medications through second lumen of PICC.  Blood draw is positional.

## 2015-03-11 NOTE — ED Notes (Signed)
Pt decreased SpO2 to 85% RA while sleeping.  Repositioned and placed on 2 L nasal cannula.

## 2015-03-11 NOTE — ED Provider Notes (Addendum)
CSN: [id number]     Arrival date & time 03/11/15  0743 History   First MD Initiated Contact with Patient 03/11/15 [phone]     No chief complaint on file.    (Consider location/radiation/quality/duration/timing/severity/associated sxs/prior Treatment) HPI Patient awakened 6 AM today feeling "sluggish" and short of breath. EMS was called. EMS treated patient with supplemental oxygen. His pulse oximetry on room air was in the "low 90s" per EMS. No other treatment prior to coming here he is presently asymptomatic without oxygen. Denies noncompliance with medications. He denies having had any chest pain fever cough. Nothing makes symptoms better or worse. Past Medical History  Diagnosis Date  . Diabetes mellitus   . Atrophy of calf muscles   . Depression   . GERD (gastroesophageal reflux disease)   . Peripheral neuropathy   . Hypercholesteremia   . Hx of tracheostomy   . Chronic systolic CHF (congestive heart failure), NYHA class 3    Past Surgical History  Procedure Laterality Date  . Pancreas surgery  Dec 10, 1999    hx pancreatitis  . Anterior cervical decomp/discectomy fusion  04/13/2012    Procedure: ANTERIOR CERVICAL DECOMPRESSION/DISCECTOMY FUSION 1 LEVEL;  Surgeon: Temple Pacini, MD;  Location: [facility]URO ORS;  Service: Neurosurgery;  Laterality: N/A;  Cervcial Five-Six Anterior Cervical Decompression and Fusion with Allograft and Plating  . Cardiac catheterization N/A 11/15/2014    Procedure: Right Heart Cath;  Surgeon: Dolores Patty, MD;  Location: [facility] INVASIVE CV LAB;  Service: Cardiovascular;  Laterality: N/A;  . Peripheral vascular catheterization N/A 02/26/2015    Procedure: Abdominal Aortogram;  Surgeon: Chuck Hint, MD;  Location: [facility] INVASIVE CV LAB;  Service: Cardiovascular;  Laterality: N/A;   Family History  Problem Relation Age of Onset  . Diabetes Mellitus II Sister   . CAD Brother   . Cancer Mother     ? type   . Cancer Father     ? type   Social History   Substance Use Topics  . Smoking status: Current Every Day Smoker -- 1.00 packs/day for 30 years    Types: Cigarettes  . Smokeless tobacco: Never Used  . Alcohol Use: No     Comment: Quit after his brother's death in 12-09-2005 or 8     Review of Systems  Constitutional: Positive for fatigue.  Respiratory: Positive for shortness of breath.        Chronic 3 pillow orthopnea  Cardiovascular: Positive for leg swelling.       Chronic leg edema  Musculoskeletal: Positive for gait problem.       Wheelchair-bound  Skin: Positive for wound.       Chronic wounds on feet, well-healing per patient and wife  All other systems reviewed and are negative.     Allergies  Review of patient's allergies indicates no known allergies.  Home Medications   Prior to Admission medications   Medication Sig Start Date End Date Taking? Authorizing Provider  acetaminophen (TYLENOL) 500 MG tablet Take 2 tablets (1,000 mg total) by mouth every 6 (six) hours as needed for mild pain or headache. 11/21/14   Joline Salt Barrett, PA-C  atorvastatin (LIPITOR) 40 MG tablet Take 1 tablet (40 mg total) by mouth daily at 6 PM. 03/01/15   Amy D Clegg, NP  bisacodyl (DULCOLAX) 5 MG EC tablet Take 5 mg by mouth daily as needed for mild constipation or moderate constipation.    Historical Provider, MD  buPROPion (WELLBUTRIN SR) 150 MG 12 hr tablet  Take 150 mg by mouth 2 (two) times daily. 01/29/15   Historical Provider, MD  clopidogrel (PLAVIX) 75 MG tablet Take 1 tablet (75 mg total) by mouth daily. 03/01/15   Amy Georgie Chard, NP  [facility] VITAMIN B12 1000 MCG tablet Take 1,000 mcg by mouth daily. 10/15/14   Historical Provider, MD  digoxin (LANOXIN) 0.125 MG tablet Take 0.5 tablets (0.0625 mg total) by mouth daily. 03/01/15   Amy D Filbert Schilder, NP  DULoxetine (CYMBALTA) 30 MG capsule Take 1 capsule (30 mg total) by mouth daily. 02/04/15   Drema Dallas, MD  esomeprazole (NEXIUM) 40 MG capsule Take 40 mg by mouth daily before breakfast.    Historical  Provider, MD  ferrous sulfate 325 (65 FE) MG EC tablet Take 1 tablet (325 mg total) by mouth daily with breakfast. 03/08/15   Amy D Clegg, NP  hydrALAZINE (APRESOLINE) 25 MG tablet Take 1 tablet (25 mg total) by mouth every 8 (eight) hours. 03/01/15   Amy D Filbert Schilder, NP  insulin aspart (NOVOLOG) 100 UNIT/ML injection Inject 0-15 Units into the skin 3 (three) times daily with meals. CBG 70 - 120: 0 units CBG 121 - 150: 2 units CBG 151 - 200: 3 units CBG 201 - 250: 5 units CBG 251 - 300: 8 units CBG 301 - 350: 11 units CBG 351 - 400: 15 units Patient taking differently: Inject 0-15 Units into the skin 3 (three) times daily as needed for high blood sugar. CBG 70 - 120: 0 units CBG 121 - 150: 2 units CBG 151 - 200: 3 units CBG 201 - 250: 5 units CBG 251 - 300: 8 units CBG 301 - 350: 11 units CBG 351 - 400: 15 units 11/13/13   Maryann Mikhail, DO  insulin glargine (LANTUS) 100 UNIT/ML injection Inject 0.3-0.4 mLs (30-40 Units total) into the skin 2 (two) times daily. 40 U am, 30 U pm Patient taking differently: Inject 30-50 Units into the skin 2 (two) times daily. 30 U am, 50 U pm 11/21/14   Rhonda G Barrett, PA-C  isosorbide mononitrate (IMDUR) 30 MG 24 hr tablet Take 1 tablet (30 mg total) by mouth daily. 02/04/15   Drema Dallas, MD  lipase/protease/amylase (CREON-10/PANCREASE) 12000 UNITS CPEP Take 1-3 capsules by mouth 2 (two) times daily. 3 capsules before meal, and 1 capsule before snack    Historical Provider, MD  magnesium oxide (MAG-OX) 400 (241.3 MG) MG tablet Take 400 mg by mouth 2 (two) times daily. 01/26/15   Historical Provider, MD  milrinone (PRIMACOR) 20 MG/100ML SOLN infusion Inject 31.8375 mcg/min into the vein continuous. Per [facility]. 12 months 03/01/15   Sherald Hess, NP  Olopatadine HCl (PATADAY) 0.2 % SOLN Apply 1 drop to eye daily as needed. For allergies    Historical Provider, MD  pregabalin (LYRICA) 150 MG capsule Take 150 mg by mouth 2 (two) times daily.    Historical Provider, MD   tiZANidine (ZANAFLEX) 4 MG tablet Take 4 mg by mouth 2 (two) times daily as needed for muscle spasms.    Historical Provider, MD  torsemide (DEMADEX) 20 MG tablet Take 1 tablet (20 mg total) by mouth 2 (two) times daily. 03/01/15   Amy D Filbert Schilder, NP  traMADol (ULTRAM) 50 MG tablet Take 50 mg by mouth every 6 (six) hours as needed for moderate pain.     Historical Provider, MD  VENTOLIN HFA 108 (90 BASE) MCG/ACT inhaler Inhale 1-2 puffs into the lungs every 6 (six) hours as needed  for wheezing or shortness of breath.  02/15/15   Historical Provider, MD   There were no vitals taken for this visit. Physical Exam  Constitutional:  Chronically ill-appearing  HENT:  Head: Normocephalic and atraumatic.  Eyes: Conjunctivae are normal. Pupils are equal, round, and reactive to light.  Neck: Neck supple. No tracheal deviation present. No thyromegaly present.  Cardiovascular: Normal rate and regular rhythm.   Pulmonary/Chest: Effort normal.  Speaks in paragraphs No respiratory distress rales at right base  Abdominal: Soft. Bowel sounds are normal. He exhibits no distension. There is no tenderness.  Musculoskeletal: Normal range of motion. He exhibits edema. He exhibits no tenderness.  3+ bilateral pretibial pitting edema  Neurological: He is alert. Coordination normal.  Skin: Skin is warm and dry. No rash noted.  Healing wounds at great toes bilaterally  Psychiatric: He has a normal mood and affect.  Nursing note and vitals reviewed.   ED Course  Procedures (including critical care time) Labs Review Labs Reviewed - No data to display  Imaging Review No results found. I have personally reviewed and evaluated these images and lab results as part of my medical decision-making.   EKG Interpretation   Date/Time:  Sunday March 11 2015 07:55:02 EDT Ventricular Rate:  94 PR Interval:  201 QRS Duration: 152 QT Interval:  420 QTC Calculation: 525 R Axis:   -73 Text Interpretation:  Sinus  rhythm Borderline prolonged PR interval  Probable left atrial enlargement Left bundle branch block No significant  change since last tracing Confirmed by   MD,  ([id number]) on  03/11/2015 8:06:57 AM     10 :52 AM patient is sleepy arousable to verbal stimulus.. I was notified the patient's blood sugars 21. D50 ordered Results for orders placed or performed during the hospital encounter of 03/11/15  Comprehensive metabolic panel  Result Value Ref Range   Sodium 136 135 - 145 mmol/L   Potassium 3.4 (L) 3.5 - 5.1 mmol/L   Chloride 94 (L) 101 - 111 mmol/L   CO2 34 (H) 22 - 32 mmol/L   Glucose, Bld 21 (LL) 65 - 99 mg/dL   BUN 43 (H) 6 - 20 mg/dL   Creatinine, Ser 6.80 (H) 0.61 - 1.24 mg/dL   Calcium 8.9 8.9 - 32.1 mg/dL   Total Protein 7.1 6.5 - 8.1 g/dL   Albumin 2.7 (L) 3.5 - 5.0 g/dL   AST 56 (H) 15 - 41 U/L   ALT 34 17 - 63 U/L   Alkaline Phosphatase 413 (H) 38 - 126 U/L   Total Bilirubin 1.5 (H) 0.3 - 1.2 mg/dL   GFR calc non Af Amer 48 (L) >60 mL/min   GFR calc Af Amer 56 (L) >60 mL/min   Anion gap 8 5 - 15  CBC with Differential/Platelet  Result Value Ref Range   WBC 7.3 4.0 - 10.5 K/uL   RBC 4.45 4.22 - 5.81 MIL/uL   Hemoglobin 9.7 (L) 13.0 - 17.0 g/dL   HCT 22.4 (L) 82.5 - 00.3 %   MCV 69.9 (L) 78.0 - 100.0 fL   MCH 21.8 (L) 26.0 - 34.0 pg   MCHC 31.2 30.0 - 36.0 g/dL   RDW 70.4 (H) 88.8 - 91.6 %   Platelets 286 150 - 400 K/uL   Neutrophils Relative % 85 (H) 43 - 77 %   Neutro Abs 6.2 1.7 - 7.7 K/uL   Lymphocytes Relative 6 (L) 12 - 46 %   Lymphs Abs 0.5 (L) 0.7 - 4.0 K/uL  Monocytes Relative 7 3 - 12 %   Monocytes Absolute 0.5 0.1 - 1.0 K/uL   Eosinophils Relative 1 0 - 5 %   Eosinophils Absolute 0.1 0.0 - 0.7 K/uL   Basophils Relative 1 0 - 1 %   Basophils Absolute 0.0 0.0 - 0.1 K/uL  Troponin I  Result Value Ref Range   Troponin I 0.06 (H) <0.031 ng/mL  Digoxin level  Result Value Ref Range   Digoxin Level 1.0 0.8 - 2.0 ng/mL  I-Stat Arterial Blood  Gas, ED - (order at [facility] and [facility] only)  Result Value Ref Range   pH, Arterial 7.439 7.350 - 7.450   pCO2 arterial 49.5 (H) 35.0 - 45.0 mmHg   pO2, Arterial 55.0 (L) 80.0 - 100.0 mmHg   Bicarbonate 33.7 (H) 20.0 - 24.0 mEq/L   TCO2 35 0 - 100 mmol/L   O2 Saturation 89.0 %   Acid-Base Excess 7.0 (H) 0.0 - 2.0 mmol/L   Patient temperature 97.6 F    Collection site RADIAL, ALLEN'S TEST ACCEPTABLE    Drawn by Operator    Sample type ARTERIAL    Dg Chest 2 View  03/11/2015   CLINICAL DATA:  Acute shortness of breath.  History of CHF.  EXAM: CHEST  2 VIEW  COMPARISON:  02/22/2015 and prior exams  FINDINGS: Cardiomegaly and mild interstitial pulmonary edema noted, increased from the prior study.  A right PICC line is noted with tip overlying the mid mid-upper SVC.  There is no evidence of pneumothorax or pleural effusion.  No acute bony abnormalities are noted.  IMPRESSION: Cardiomegaly with mild-moderate interstitial pulmonary edema.   Electronically Signed   By: Harmon Pier M.D.   On: 03/11/2015 08:48   Dg Chest Port 1 View  02/22/2015   CLINICAL DATA:  Nonproductive cough, dyspnea, hypoglycemia, renal failure.  EXAM: PORTABLE CHEST - 1 VIEW  COMPARISON:  Portable chest x-ray of January 30, 2015  FINDINGS: The lungs are adequately inflated. There is subsegmental atelectasis or infiltrate just above the right hemidiaphragm. The left lung is clear The cardiac silhouette remains enlarged. There is mild central pulmonary vascular prominence. The PICC line tip projects over the midportion of the SVC. There is degenerative change of the core though clavicular articulation.  IMPRESSION: Infiltrate or atelectasis at the right lung base slightly more conspicuous today. Low-grade compensated CHF.   Electronically Signed   By: David  [place] M.D.   On: 02/22/2015 08:37   Chest xray viewed by me   11am pt more awake , after treatment with d50 iv, cgg =162  1110 am pt arouses to tactile stimuli for 30-40 seconds,  fooolws simple commands but is somulent, bibap, repeat cbg ordered Dr. Royann Shivers here with pt MDM  Patient has oxygen requirement from his blood gas result. I spoke with Dr.Croitoru from cardiology service plan oxygen diurese patient inpatient stay, glucose monitoring. Lasix ordered Final diagnoses:  None   diagnosiss #1 congestive heart failure #2 hypoxia #3 hypoglycemia #4 hypokalemia #5 anemia #6 renal insufficiency CRITICAL CARE Performed by: Doug Sou Total critical care time: 35 minute Critical care time was exclusive of separately billable procedures and treating other patients. Critical care was necessary to treat or prevent imminent or life-threatening deterioration. Critical care was time spent personally by me on the following activities: development of treatment plan with patient and/or surrogate as well as nursing, discussions with consultants, evaluation of patient's response to treatment, examination of patient, obtaining history from patient or surrogate, ordering and  performing treatments and interventions, ordering and review of laboratory studies, ordering and review of radiographic studies, pulse oximetry and re-evaluation of patient's condition.     Doug Sou, MD 03/11/15 1101  Doug Sou, MD 03/11/15 [phone]

## 2015-03-11 NOTE — ED Notes (Signed)
Cardiology at bedside requesting Bipap, respiratory notified and is coming to pt room to set up. Pt remains very lethargic at this time. CBG up to 146 after D50.

## 2015-03-11 NOTE — Progress Notes (Addendum)
ED CM noted patient to have had 3 hospitalizations in the past 6 months. Patient presented to the ED with of weakness and lethargy. ED CM spoke with patient and wife at bedside, Patient lives at home with wife and  As per wife patient uses crutches and w/c for mobility. Patient admitted last month with HF, h/o of systolic HF with EF 20-25%, polysubstance abuse, DM2, remote pancreatitis. Patient active with [facility], on Milrinone gtt. Wife states, they saw Dr. Gala Romney at [facility] just this past Thursday. In ED patient O2 sat down as low as 85% on RA, ABG done( Arterial )revealed PCO2 49.5 , PO2 55.0. Cardiology Consulted for admission, Patient place on Bi-Pap in ED.

## 2015-03-11 NOTE — Progress Notes (Signed)
Pt arrived to Unit, lethargic but able to answer simple questions, VSS NAD, pt on bipap. Md aware of his arrival

## 2015-03-11 NOTE — Progress Notes (Signed)
Pt now A&O. Talking, asking appropriate questions. Placed on VM at 45%, sats mid 90's.

## 2015-03-11 NOTE — ED Notes (Signed)
Pt transporting to xray.  

## 2015-03-11 NOTE — ED Notes (Signed)
Removed O2 for RA ABG

## 2015-03-11 NOTE — Progress Notes (Signed)
Unit CM UR Completed by [facility] ED CM  W. Lindell Tussey RN  

## 2015-03-11 NOTE — Procedures (Signed)
Pt is stable on venti mask 45% with 10L.  RT will continue to monitor pt and place on BiPAP if needed or requested.

## 2015-03-11 NOTE — ED Notes (Addendum)
Pt to ED via [facility] with complaint of feeling more fatigued than usual. Pt is one of Dr. Cheyenne Adas patients. Pt has hx of CHF and was placed on Millrinone gtt last week. Pt reports requiring more pillows at night over the last week 6-7 pillows. Pt is lethargic, EMS reports pt falling asleep while being spoken too. Pt has +2 edema from knees and below bilaterally. Pt was placed on 2L O2 in route, sats @ 99%. Last known well last night. BP 138/90. CBG 92.

## 2015-03-12 DIAGNOSIS — L8990 Pressure ulcer of unspecified site, unspecified stage: Secondary | ICD-10-CM | POA: Insufficient documentation

## 2015-03-12 LAB — BASIC METABOLIC PANEL
Anion gap: 8 (ref 5–15)
BUN: 38 mg/dL — AB (ref 6–20)
CALCIUM: 8.6 mg/dL — AB (ref 8.9–10.3)
CHLORIDE: 93 mmol/L — AB (ref 101–111)
CO2: 34 mmol/L — AB (ref 22–32)
CREATININE: 1.49 mg/dL — AB (ref 0.61–1.24)
GFR calc Af Amer: >60 mL/min (ref >60)
GFR calc non Af Amer: 52 mL/min — ABNORMAL LOW (ref >60)
Glucose, Bld: 154 mg/dL — ABNORMAL HIGH (ref 65–99)
Potassium: 4 mmol/L (ref 3.5–5.1)
Sodium: 135 mmol/L (ref 135–145)

## 2015-03-12 LAB — GLUCOSE, CAPILLARY
GLUCOSE-CAPILLARY: 131 mg/dL — AB (ref 65–99)
GLUCOSE-CAPILLARY: 143 mg/dL — AB (ref 65–99)
GLUCOSE-CAPILLARY: 186 mg/dL — AB (ref 65–99)
GLUCOSE-CAPILLARY: 215 mg/dL — AB (ref 65–99)
Glucose-Capillary: 198 mg/dL — ABNORMAL HIGH (ref 65–99)

## 2015-03-12 LAB — CARBOXYHEMOGLOBIN
Carboxyhemoglobin: 2.1 % — ABNORMAL HIGH (ref 0.5–1.5)
METHEMOGLOBIN: 0.8 % (ref 0.0–1.5)
O2 Saturation: 53.9 %
Total hemoglobin: 8.8 g/dL — ABNORMAL LOW (ref 13.5–18.0)

## 2015-03-12 MED ORDER — POTASSIUM CHLORIDE CRYS ER 20 MEQ PO TBCR
40.0000 meq | EXTENDED_RELEASE_TABLET | Freq: Once | ORAL | Status: AC
  Administered 2015-03-12: 40 meq via ORAL
  Filled 2015-03-12: qty 2

## 2015-03-12 MED ORDER — METOLAZONE 5 MG PO TABS
5.0000 mg | ORAL_TABLET | Freq: Every day | ORAL | Status: DC
  Administered 2015-03-12 – 2015-03-13 (×2): 5 mg via ORAL
  Filled 2015-03-12: qty 2
  Filled 2015-03-12: qty 1

## 2015-03-12 NOTE — Progress Notes (Signed)
Advanced Heart Failure Rounding Note   Subjective:    Admitted 9/4 for increased lethargy and resp failure. Glucose initially 21 but also felt to have component of ADHF. However all parameters improved since last admit (BNP down, weight ok, renal function better, co-ox improved).   On lasix gtt at 10. Diuresed well overnight but now u/o dropped off. CVP 18-19. Feels better this am. Off bipap. Co-ox 54%. No dyspnea.     Objective:   Weight Range:  Vital Signs:   Temp:  [97.8 F (36.6 C)-99.2 F (37.3 C)] 98.3 F (36.8 C) (09/05 0757) Pulse Rate:  [83-110] 109 (09/05 0800) Resp:  [7-30] 30 (09/05 0800) BP: (94-140)/(47-98) 106/70 mmHg (09/05 0800) SpO2:  [86 %-100 %] 93 % (09/05 0800) FiO2 (%):  [32 %-45 %] 45 % (09/04 1800) Weight:  [87.998 kg (194 lb)-89.1 kg (196 lb 6.9 oz)] 89.1 kg (196 lb 6.9 oz) (09/05 0500) Last BM Date: 03/10/15  Weight change: Filed Weights   03/11/15 1600 03/12/15 0500  Weight: 87.998 kg (194 lb) 89.1 kg (196 lb 6.9 oz)    Intake/Output:   Intake/Output Summary (Last 24 hours) at 03/12/15 0938 Last data filed at 03/12/15 0800  Gross per 24 hour  Intake 1575.73 ml  Output   3640 ml  Net -2064.27 ml     Physical Exam: General: Lying in bed . No resp difficulty.   HEENT: normal Neck: supple. JVP ear. Carotids 2+ bilaterally; no bruits. No lymphadenopathy or thryomegaly appreciated. Cor: PMI laterally displaced. Regular, mildly tachycardic,+S3, 2/6 MR Lungs: Lungs decreased in the bases.  Abdomen: soft, obese nontender, nondistended. No hepatosplenomegaly. No bruits or masses. Good bowel sounds. Extremities: Cool. No clubbing, rash. 2+ edema. RUE PICC. Feet healing well.  Neuro: alert & orientedx3, cranial nerves grossly intact. Moves all 4 extremities w/o difficulty. Weak in bilateral legs. Affect pleasant.  Telemetry: NSR   Labs: Basic Metabolic Panel:  Recent Labs Lab 03/11/15 0910 03/11/15 1702 03/12/15 0447  NA 136  --   135  K 3.4*  --  4.0  CL 94*  --  93*  CO2 34*  --  34*  GLUCOSE 21*  --  154*  BUN 43*  --  38*  CREATININE 1.58*  --  1.49*  CALCIUM 8.9  --  8.6*  MG  --  2.0  --     Liver Function Tests:  Recent Labs Lab 03/11/15 0910  AST 56*  ALT 34  ALKPHOS 413*  BILITOT 1.5*  PROT 7.1  ALBUMIN 2.7*   No results for input(s): LIPASE, AMYLASE in the last 168 hours.  Recent Labs Lab 03/11/15 1313  AMMONIA 26    CBC:  Recent Labs Lab 03/11/15 0910  WBC 7.3  NEUTROABS 6.2  HGB 9.7*  HCT 31.1*  MCV 69.9*  PLT 286    Cardiac Enzymes:  Recent Labs Lab 03/11/15 0910  TROPONINI 0.06*    BNP: BNP (last 3 results)  Recent Labs  01/30/15 2020 02/21/15 1415 03/11/15 1702  BNP 2790.6* 1554.1* 1184.5*    ProBNP (last 3 results) No results for input(s): PROBNP in the last 8760 hours.    Other results:  Imaging: Dg Chest 2 View  03/11/2015   CLINICAL DATA:  Acute shortness of breath.  History of CHF.  EXAM: CHEST  2 VIEW  COMPARISON:  02/22/2015 and prior exams  FINDINGS: Cardiomegaly and mild interstitial pulmonary edema noted, increased from the prior study.  A right PICC line is noted with  tip overlying the mid mid-upper SVC.  There is no evidence of pneumothorax or pleural effusion.  No acute bony abnormalities are noted.  IMPRESSION: Cardiomegaly with mild-moderate interstitial pulmonary edema.   Electronically Signed   By: Harmon Pier M.D.   On: 03/11/2015 08:48      Medications:     Scheduled Medications: . atorvastatin  40 mg Oral q1800  . buPROPion  150 mg Oral BID  . clopidogrel  75 mg Oral Daily  . digoxin  0.0625 mg Oral Daily  . ferrous sulfate  325 mg Oral Q breakfast  . heparin subcutaneous  5,000 Units Subcutaneous 3 times per day  . hydrALAZINE  25 mg Oral 3 times per day  . insulin aspart  0-9 Units Subcutaneous TID WC  . magnesium oxide  400 mg Oral BID     Infusions: . dextrose Stopped (03/12/15 0400)  . furosemide (LASIX)  infusion 10 mg/hr (03/11/15 1337)  . milrinone 0.375 mcg/kg/min (03/11/15 2033)     PRN Medications:  bisacodyl   Assessment:   1. Hypoglycemia 2. Acute respiratory failure 3. Acute on chronic systolic HF - due to suspected NICM. EF 15%. On home milrinone 4. PAD with recent stenting or R common iliac 5. CKD, stage III 6. DM2 7. Tobacco use  Plan/Discussion:    I think the main thing that brought him in the hospital was his severe hypoglycemia. From a HF perspective he is improved. CVP remains high but this is about as good as we could get him during the last admit. With him lying flat I suspect PCWP may actually be fairly low and this is more of a right-sided problem currently but he refuses RHC to assess. Will continue milrinone and lasix gtt. Add one dose metolazone. Move to SDU. Possibly home in am.    Length of Stay: 1   Marcellous Snarski MD 03/12/2015, 9:38 AM  Advanced Heart Failure Team Pager [phone] (M-F; 7a - 4p)  Please contact [facility] for night-coverage after hours (4p -7a ) and weekends on [email or web address]

## 2015-03-12 NOTE — Progress Notes (Addendum)
Wife called RN to bedside, pts is vomiting. Pt vomited large amount in the bed, and then half green bag full ( ). Pt states that he feels better, he thinks it "maybe the cheese burger i ate for lunch i don't usually eat those". VSS no change in EKG, no pain anywhere per pt. Wife at bedside, we got pt cleaned up paged MD, awaiting call back. Afebrile. Will cont to keep monitor close. bg 185 holding AC dose of insulin d/t vomiting as well as him not eating.

## 2015-03-13 ENCOUNTER — Encounter (HOSPITAL_COMMUNITY): Payer: Medicare Other

## 2015-03-13 ENCOUNTER — Telehealth (HOSPITAL_COMMUNITY): Payer: Self-pay

## 2015-03-13 DIAGNOSIS — N183 Chronic kidney disease, stage 3 unspecified: Secondary | ICD-10-CM | POA: Insufficient documentation

## 2015-03-13 DIAGNOSIS — L8990 Pressure ulcer of unspecified site, unspecified stage: Secondary | ICD-10-CM

## 2015-03-13 DIAGNOSIS — I739 Peripheral vascular disease, unspecified: Secondary | ICD-10-CM | POA: Insufficient documentation

## 2015-03-13 LAB — BASIC METABOLIC PANEL
Anion gap: 12 (ref 5–15)
BUN: 34 mg/dL — AB (ref 6–20)
CHLORIDE: 91 mmol/L — AB (ref 101–111)
CO2: 34 mmol/L — AB (ref 22–32)
Calcium: 8.8 mg/dL — ABNORMAL LOW (ref 8.9–10.3)
Creatinine, Ser: 1.67 mg/dL — ABNORMAL HIGH (ref 0.61–1.24)
GFR calc Af Amer: 52 mL/min — ABNORMAL LOW (ref >60)
GFR, EST NON AFRICAN AMERICAN: 45 mL/min — AB (ref >60)
GLUCOSE: 260 mg/dL — AB (ref 65–99)
POTASSIUM: 3.6 mmol/L (ref 3.5–5.1)
Sodium: 137 mmol/L (ref 135–145)

## 2015-03-13 LAB — CARBOXYHEMOGLOBIN
Carboxyhemoglobin: 2 % — ABNORMAL HIGH (ref 0.5–1.5)
Methemoglobin: 0.7 % (ref 0.0–1.5)
O2 SAT: 68.9 %
Total hemoglobin: 14.2 g/dL (ref 13.5–18.0)

## 2015-03-13 LAB — HEMOGLOBIN A1C
Hgb A1c MFr Bld: 7.7 % — ABNORMAL HIGH (ref 4.8–5.6)
MEAN PLASMA GLUCOSE: 174 mg/dL

## 2015-03-13 LAB — GLUCOSE, CAPILLARY: Glucose-Capillary: 220 mg/dL — ABNORMAL HIGH (ref 65–99)

## 2015-03-13 MED ORDER — INSULIN GLARGINE 100 UNIT/ML ~~LOC~~ SOLN: 20.0000 [IU] | Freq: Two times a day (BID) | SUBCUTANEOUS | Status: DC

## 2015-03-13 MED ORDER — TORSEMIDE 20 MG PO TABS: 20.0000 mg | ORAL_TABLET | Freq: Two times a day (BID) | ORAL | Status: DC

## 2015-03-13 NOTE — Care Management Note (Signed)
Case Management Note  Patient Details  Name: Christian Wall MRN: [id number] Date of Birth: [birth year]  Subjective/Objective:     Adm w chf               Action/Plan: lives w wife, pcp dr Concepcion Elk   Expected Discharge Date:                 Expected Discharge Plan:  Home w Home Health Services  In-House Referral:     Discharge planning Services  CM Consult  Post Acute Care Choice:  Resumption of Svcs/PTA Provider Choice offered to:     DME Arranged:   (IV Milronone) DME Agency:  [facility]  HH Arranged:  Disease Management, RN [facility] Agency:     Status of Service:     Medicare Important Message Given:    Date Medicare IM Given:    Medicare IM give by:    Date Additional Medicare IM Given:    Additional Medicare Important Message give by:     If discussed at Long Length of Stay Meetings, dates discussed:    Additional Comments: have left vm for donna and vm for pam w [facility] that will go home w milrinone today  Hanley Hays, RN 03/13/2015, 9:33 AM

## 2015-03-13 NOTE — Progress Notes (Signed)
Pharmacist Heart Failure Core Measure Documentation  Assessment: Christian Wall has an EF documented as 15%  on 02/27/15 by ECHO.  Rationale: Heart failure patients with left ventricular systolic dysfunction (LVSD) and an EF < 40% should be prescribed an angiotensin converting enzyme inhibitor (ACEI) or angiotensin receptor blocker (ARB) at discharge unless a contraindication is documented in the medical record.  This patient is not currently on an ACEI or ARB for HF.  This note is being placed in the record in order to provide documentation that a contraindication to the use of these agents is present for this encounter.  ACE Inhibitor or Angiotensin Receptor Blocker is contraindicated (specify all that apply)  []   ACEI allergy AND ARB allergy []   Angioedema []   Moderate or severe aortic stenosis []   Hyperkalemia []   Hypotension []   Renal artery stenosis [x]   Worsening renal function, preexisting renal disease or dysfunction   Fredrik Rigger 03/13/2015 9:33 AM

## 2015-03-13 NOTE — Progress Notes (Addendum)
Advanced Heart Failure Rounding Note   Subjective:    Admitted 9/4 for increased lethargy and resp failure. Glucose initially 21 but also felt to have component of ADHF. However all parameters improved since last admit (BNP down, weight ok, renal function better, co-ox improved).   Remains on lasix drip at 10 mg per hour + milrinone 0.375 mcg. Weight down 9 pounds.  Wants to go home. Father-in-law died last night.  Denies SOB/Orthopnea.   CO-OX stable 69%.   Objective:   Weight Range:  Vital Signs:   Temp:  [98.3 F (36.8 C)-98.6 F (37 C)] 98.6 F (37 C) (09/06 0429) Pulse Rate:  [97-112] 98 (09/06 0429) Resp:  [13-30] 13 (09/06 0429) BP: (98-143)/(58-100) 127/64 mmHg (09/06 0429) SpO2:  [87 %-100 %] 96 % (09/06 0429) Weight:  [187 lb 6.3 oz (85 kg)] 187 lb 6.3 oz (85 kg) (09/06 0429) Last BM Date: 03/12/15  Weight change: Filed Weights   03/11/15 1600 03/12/15 0500 03/13/15 0429  Weight: 194 lb (87.998 kg) 196 lb 6.9 oz (89.1 kg) 187 lb 6.3 oz (85 kg)    Intake/Output:   Intake/Output Summary (Last 24 hours) at 03/13/15 0754 Last data filed at 03/13/15 0700  Gross per 24 hour  Intake  782.8 ml  Output   5240 ml  Net -4457.2 ml     Physical Exam: CVP 16  General: Lying in bed . No resp difficulty.   HEENT: normal Neck: supple. JVP ear. Carotids 2+ bilaterally; no bruits. No lymphadenopathy or thryomegaly appreciated. Cor: PMI laterally displaced. Regular, mildly tachycardic,+S3, 2/6 MR Lungs: Lungs decreased in the bases.  Abdomen: soft, obese nontender, nondistended. No hepatosplenomegaly. No bruits or masses. Good bowel sounds. Extremities: Cool. No clubbing, rash. 1-2+ edema. RUE PICC. Feet healing well.  Neuro: alert & orientedx3, cranial nerves grossly intact. Moves all 4 extremities w/o difficulty. Weak in bilateral legs. Affect pleasant.  Telemetry: NSR 90s  Labs: Basic Metabolic Panel:  Recent Labs Lab 03/11/15 0910 03/11/15 1702  03/12/15 0447 03/13/15 0536  NA 136  --  135 137  K 3.4*  --  4.0 3.6  CL 94*  --  93* 91*  CO2 34*  --  34* 34*  GLUCOSE 21*  --  154* 260*  BUN 43*  --  38* 34*  CREATININE 1.58*  --  1.49* 1.67*  CALCIUM 8.9  --  8.6* 8.8*  MG  --  2.0  --   --     Liver Function Tests:  Recent Labs Lab 03/11/15 0910  AST 56*  ALT 34  ALKPHOS 413*  BILITOT 1.5*  PROT 7.1  ALBUMIN 2.7*   No results for input(s): LIPASE, AMYLASE in the last 168 hours.  Recent Labs Lab 03/11/15 1313  AMMONIA 26    CBC:  Recent Labs Lab 03/11/15 0910  WBC 7.3  NEUTROABS 6.2  HGB 9.7*  HCT 31.1*  MCV 69.9*  PLT 286    Cardiac Enzymes:  Recent Labs Lab 03/11/15 0910  TROPONINI 0.06*    BNP: BNP (last 3 results)  Recent Labs  01/30/15 2020 02/21/15 1415 03/11/15 1702  BNP 2790.6* 1554.1* 1184.5*    ProBNP (last 3 results) No results for input(s): PROBNP in the last 8760 hours.    Other results:  Imaging: Dg Chest 2 View  03/11/2015   CLINICAL DATA:  Acute shortness of breath.  History of CHF.  EXAM: CHEST  2 VIEW  COMPARISON:  02/22/2015 and prior exams  FINDINGS: Cardiomegaly and mild interstitial pulmonary edema noted, increased from the prior study.  A right PICC line is noted with tip overlying the mid mid-upper SVC.  There is no evidence of pneumothorax or pleural effusion.  No acute bony abnormalities are noted.  IMPRESSION: Cardiomegaly with mild-moderate interstitial pulmonary edema.   Electronically Signed   By: Harmon Pier M.D.   On: 03/11/2015 08:48     Medications:     Scheduled Medications: . atorvastatin  40 mg Oral q1800  . buPROPion  150 mg Oral BID  . clopidogrel  75 mg Oral Daily  . digoxin  0.0625 mg Oral Daily  . ferrous sulfate  325 mg Oral Q breakfast  . heparin subcutaneous  5,000 Units Subcutaneous 3 times per day  . hydrALAZINE  25 mg Oral 3 times per day  . insulin aspart  0-9 Units Subcutaneous TID WC  . magnesium oxide  400 mg Oral BID   . metolazone  5 mg Oral Daily    Infusions: . dextrose Stopped (03/12/15 0400)  . furosemide (LASIX) infusion 10 mg/hr (03/11/15 1337)  . milrinone 0.375 mcg/kg/min (03/13/15 0631)    PRN Medications: bisacodyl   Assessment:   1. Hypoglycemia- Glucose on admit 20  2. Acute respiratory failure 3. Acute on chronic systolic HF - due to suspected NICM. EF 15%. On home milrinone 4. PAD with recent stenting or R common iliac 5. CKD, stage III 6. DM2 7. Tobacco use  Plan/Discussion:   Remains on lasix + milrinone 0.375 mcg.  Stop lasix drip. Restart torsemide 20 mg twice a day. D/C foley.  CVP remains high but this is about as good as we could get him during the last admit due to R sided HF.   He refuses RHC to assess hemodynamics.   Hypoglycemic on admit. Adjust lantus.   Wants to go home today to prepare for father-in-law's funeral.    Follow up in HF clinic next week. [facility] to resume HH and home milrinone.  Length of Stay: 2   CLEGG,AMY NP-C  03/13/2015, 7:54 AM  Advanced Heart Failure Team Pager [phone] (M-F; 7a - 4p)  Please contact [facility] for night-coverage after hours (4p -7a ) and weekends on [email or web address]   Patient seen and examined with [name], NP. We discussed all aspects of the encounter. I agree with the assessment and plan as stated above.   Much improved. Main reason for admit was severe hypoglycemia. DM2 meds cut back.  HF relatively stable though CVP remains elevated. Refuses THC to further assess.  Will d/c today with close f/u in HF Clinic.  Zuleica Seith,MD 10:16 AM

## 2015-03-13 NOTE — Discharge Instructions (Signed)

## 2015-03-13 NOTE — Telephone Encounter (Signed)
error 

## 2015-03-13 NOTE — Discharge Summary (Signed)
Advanced Heart Failure Team  Discharge Summary   Patient ID: Christian Wall MRN: [id number], DOB/AGE: [birth year] 54 y.o. Admit date: 03/11/2015 D/C date:     03/13/2015   Primary Discharge Diagnoses:   1. Hypoglycemia- Glucose on admit 20  2. Acute respiratory failure 3. Acute on chronic systolic HF - due to suspected NICM. EF 15%. On home milrinone 4. PAD - H/O Stent or R common iliac 02/2015  5. CKD, stage III 6. DM2 7. 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    Admitted with increased dyspnea and hypoglycemia. Glucose on admit was 21 and he was given dextrose. CXR on admit concerning for edema. ABG showed hypercarbia and hypoxia so he required short term BiPap and later transitioned to room air.   He continued on milrinone 0.375 mcg and diuresed with lasix drip. Once his volume status improved he transitioned back to torsemide. Renal function was followed closely and remained stable. He is not on bb due to low output HF. He will continue hydralazine/imdur. No Ace with CKD.  He will continue on milrinone 0.375 mcg with [facility] following for weekly lab work.   Due to hypoglycemia noted on admit, insulin was stopped and later restarted at a reduced dose. He will continue to be followed closely in the HF clinic.   Discharge Weight Range: 187 pounds (bed weight)  Discharge Vitals: Blood pressure 118/61, pulse 98, temperature 97.3 F (36.3 C), temperature source Oral, resp. rate 21, height 5\' 8"  (1.727 m), weight 187 lb 6.3 oz (85 kg), SpO2 94 %.  Labs: Lab Results  Component Value Date   WBC 7.3 03/11/2015   HGB 9.7* 03/11/2015   HCT 31.1* 03/11/2015   MCV 69.9* 03/11/2015   PLT 286 03/11/2015    Recent Labs Lab 03/11/15 0910   03/13/15 0536  NA 136  < > 137  K 3.4*  < > 3.6  CL 94*  < > 91*  CO2 34*  < > 34*  BUN 43*  < > 34*  CREATININE 1.58*  < > 1.67*  CALCIUM 8.9  < > 8.8*  PROT 7.1  --   --   BILITOT 1.5*  --   --   ALKPHOS 413*  --   --   ALT 34  --   --   AST 56*  --   --   GLUCOSE 21*  < > 260*  < > = values in this interval not displayed. Lab Results  Component Value Date   CHOL 185 02/02/2015   HDL 10* 02/02/2015   LDLCALC 132* 02/02/2015   TRIG 213* 02/02/2015   BNP (last 3 results)  Recent Labs  01/30/15 2020 02/21/15 1415 03/11/15 1702  BNP 2790.6* 1554.1* 1184.5*    ProBNP (last 3 results) No results for input(s): PROBNP in the last 8760 hours.   Diagnostic Studies/Procedures   No results found.  Discharge Medications     Medication List    STOP taking these medications        digoxin 0.125 MG tablet  Commonly known as:  LANOXIN      TAKE these medications        acetaminophen 500 MG tablet  Commonly known as:  TYLENOL  Take 2 tablets (1,000 mg  total) by mouth every 6 (six) hours as needed for mild pain or headache.     atorvastatin 40 MG tablet  Commonly known as:  LIPITOR  Take 1 tablet (40 mg total) by mouth daily at 6 PM.     bisacodyl 5 MG EC tablet  Commonly known as:  DULCOLAX  Take 5 mg by mouth daily as needed for mild constipation or moderate constipation.     buPROPion 150 MG 12 hr tablet  Commonly known as:  WELLBUTRIN SR  Take 150 mg by mouth 2 (two) times daily.     clopidogrel 75 MG tablet  Commonly known as:  PLAVIX  Take 1 tablet (75 mg total) by mouth daily.     [employer] VITAMIN B12 1000 MCG tablet  Generic drug:  cyanocobalamin  Take 1,000 mcg by mouth daily.     DULoxetine 30 MG capsule  Commonly known as:  CYMBALTA  Take 1 capsule (30 mg total) by mouth daily.     esomeprazole 40 MG capsule  Commonly known as:  NEXIUM  Take 40 mg by mouth daily before breakfast.     ferrous sulfate 325 (65 FE) MG EC tablet  Take 1 tablet  (325 mg total) by mouth daily with breakfast.     hydrALAZINE 25 MG tablet  Commonly known as:  APRESOLINE  Take 1 tablet (25 mg total) by mouth every 8 (eight) hours.     insulin aspart 100 UNIT/ML injection  Commonly known as:  novoLOG  Inject 0-15 Units into the skin 3 (three) times daily with meals. CBG 70 - 120: 0 units CBG 121 - 150: 2 units CBG 151 - 200: 3 units CBG 201 - 250: 5 units CBG 251 - 300: 8 units CBG 301 - 350: 11 units CBG 351 - 400: 15 units     insulin glargine 100 UNIT/ML injection  Commonly known as:  LANTUS  Inject 0.2 mLs (20 Units total) into the skin 2 (two) times daily.     isosorbide mononitrate 30 MG 24 hr tablet  Commonly known as:  IMDUR  Take 1 tablet (30 mg total) by mouth daily.     lipase/protease/amylase 70786 UNITS Cpep capsule  Commonly known as:  CREON  Take 1-3 capsules by mouth 2 (two) times daily. 3 capsules before meal, and 1 capsule before snack     magnesium oxide 400 (241.3 MG) MG tablet  Commonly known as:  MAG-OX  Take 400 mg by mouth 2 (two) times daily.     milrinone 20 MG/100ML Soln infusion  Commonly known as:  PRIMACOR  Inject 31.8375 mcg/min into the vein continuous. Per [facility]. 12 months     OXYCODONE HCL PO  Take 1 tablet by mouth daily as needed (pain).     PATADAY 0.2 % Soln  Generic drug:  Olopatadine HCl  Apply 1 drop to eye daily as needed. For allergies     pregabalin 150 MG capsule  Commonly known as:  LYRICA  Take 150 mg by mouth 2 (two) times daily.     tiZANidine 4 MG tablet  Commonly known as:  ZANAFLEX  Take 4 mg by mouth 2 (two) times daily as needed for muscle spasms.     torsemide 20 MG tablet  Commonly known as:  DEMADEX  Take 1 tablet (20 mg total) by mouth 2 (two) times daily. Start the 20 mg twice a day. Start 9/6 at 6 pm     traMADol 50 MG tablet  Commonly known as:  ULTRAM  Take 50 mg by mouth every 6 (six) hours as needed for moderate pain.     VENTOLIN HFA 108 (90 BASE) MCG/ACT inhaler   Generic drug:  albuterol  Inhale 1-2 puffs into the lungs every 6 (six) hours as needed for wheezing or shortness of breath.        Disposition   The patient will be discharged in stable condition to home.     Discharge Instructions    Contraindication to ACEI at discharge    Complete by:  As directed   CKD     Diet - low sodium heart healthy    Complete by:  As directed      Heart Failure patients record your daily weight using the same scale at the same time of day    Complete by:  As directed      Increase activity slowly    Complete by:  As directed           Follow-up Information    Follow up with Arvilla Meres, MD On 03/22/2015.   Specialty:  Cardiology   Why:  at 10:20   Contact information:   [address] [place] [address] [phone]         Duration of Discharge Encounter: Greater than 35 minutes   Signed, CLEGG,AMY NP-C   03/13/2015, 8:28 AM  Patient seen and examined with [name], NP. We discussed all aspects of the encounter. I agree with the assessment and plan as stated above. He is ready for d/c. Will follow closely in HF Clinic. Refuses RHC to f/u persistently high CVP.   Rashawna Scoles,MD 9:06 PM

## 2015-03-13 NOTE — Clinical Documentation Improvement (Signed)
Cardiology  "Pressure Ulcer" is documented on the current hospital problem list by Dr. Jones Broom on 03/12/15.  Please document the stage and location of the documented "pressure ulcer".  CMS requires the attending physician to document the location and stage of pressure ulcers in the progress notes and discharge summary.  Documentation of pressure ulcers cannot be taken from staff nursing assessments.   Please exercise your independent, professional judgment when responding. A specific answer is not anticipated or expected.   Thank You,  Jerral Ralph RN BSN CCDS [phone] Health Information Management [place]

## 2015-03-14 LAB — GLUCOSE, CAPILLARY: Glucose-Capillary: 276 mg/dL — ABNORMAL HIGH (ref 65–99)

## 2015-03-15 ENCOUNTER — Telehealth (HOSPITAL_COMMUNITY): Payer: Self-pay

## 2015-03-15 NOTE — Telephone Encounter (Signed)
Nurse called chf clinic triage to report [facility] resumed [facility] care yesterday and plan to do 3 times weekly visits initially to ensure readmission does not occur. Will be doing Q Wednesday lab draws. Patient having hard time with CBGs (400-500's), education was done extensively per [facility] RN report with patient and his wife. Patient also stated to [facility] RN that place concerning on his foot is a burn. Will continue to follow up with [place] as needed, appreciated [facility] RN's dedicated care to our patient.  Ave Filter

## 2015-03-16 ENCOUNTER — Other Ambulatory Visit (HOSPITAL_COMMUNITY): Payer: Self-pay | Admitting: Internal Medicine

## 2015-03-16 LAB — CULTURE, BLOOD (ROUTINE X 2): CULTURE: NO GROWTH

## 2015-03-17 ENCOUNTER — Other Ambulatory Visit (HOSPITAL_COMMUNITY): Payer: Self-pay | Admitting: Internal Medicine

## 2015-03-20 ENCOUNTER — Other Ambulatory Visit (HOSPITAL_COMMUNITY): Payer: Self-pay | Admitting: Internal Medicine

## 2015-03-21 ENCOUNTER — Encounter (HOSPITAL_COMMUNITY): Payer: Self-pay | Admitting: Emergency Medicine

## 2015-03-21 ENCOUNTER — Inpatient Hospital Stay (HOSPITAL_COMMUNITY)
Admission: EM | Admit: 2015-03-21 | Discharge: 2015-03-29 | DRG: 070 | Disposition: A | Discharge status: Skilled Nursing Facility | Payer: Medicare Other | Attending: Internal Medicine | Admitting: Internal Medicine

## 2015-03-21 ENCOUNTER — Emergency Department (HOSPITAL_COMMUNITY): Payer: Medicare Other

## 2015-03-21 ENCOUNTER — Inpatient Hospital Stay (HOSPITAL_COMMUNITY): Payer: Medicare Other

## 2015-03-21 ENCOUNTER — Telehealth (HOSPITAL_COMMUNITY): Payer: Self-pay | Admitting: *Deleted

## 2015-03-21 DIAGNOSIS — R278 Other lack of coordination: Secondary | ICD-10-CM | POA: Diagnosis present

## 2015-03-21 DIAGNOSIS — W19XXXA Unspecified fall, initial encounter: Secondary | ICD-10-CM

## 2015-03-21 DIAGNOSIS — Z7952 Long term (current) use of systemic steroids: Secondary | ICD-10-CM | POA: Diagnosis not present

## 2015-03-21 DIAGNOSIS — F17210 Nicotine dependence, cigarettes, uncomplicated: Secondary | ICD-10-CM | POA: Diagnosis present

## 2015-03-21 DIAGNOSIS — K219 Gastro-esophageal reflux disease without esophagitis: Secondary | ICD-10-CM | POA: Diagnosis present

## 2015-03-21 DIAGNOSIS — D638 Anemia in other chronic diseases classified elsewhere: Secondary | ICD-10-CM | POA: Diagnosis present

## 2015-03-21 DIAGNOSIS — M109 Gout, unspecified: Secondary | ICD-10-CM | POA: Diagnosis present

## 2015-03-21 DIAGNOSIS — F1910 Other psychoactive substance abuse, uncomplicated: Secondary | ICD-10-CM | POA: Diagnosis present

## 2015-03-21 DIAGNOSIS — M25561 Pain in right knee: Secondary | ICD-10-CM | POA: Diagnosis present

## 2015-03-21 DIAGNOSIS — M62569 Muscle wasting and atrophy, not elsewhere classified, unspecified lower leg: Secondary | ICD-10-CM | POA: Diagnosis present

## 2015-03-21 DIAGNOSIS — N183 Chronic kidney disease, stage 3 unspecified: Secondary | ICD-10-CM | POA: Diagnosis present

## 2015-03-21 DIAGNOSIS — E1142 Type 2 diabetes mellitus with diabetic polyneuropathy: Secondary | ICD-10-CM | POA: Diagnosis present

## 2015-03-21 DIAGNOSIS — W06XXXA Fall from bed, initial encounter: Secondary | ICD-10-CM | POA: Diagnosis not present

## 2015-03-21 DIAGNOSIS — E1122 Type 2 diabetes mellitus with diabetic chronic kidney disease: Secondary | ICD-10-CM | POA: Diagnosis present

## 2015-03-21 DIAGNOSIS — I272 Other secondary pulmonary hypertension: Secondary | ICD-10-CM | POA: Diagnosis present

## 2015-03-21 DIAGNOSIS — E11649 Type 2 diabetes mellitus with hypoglycemia without coma: Secondary | ICD-10-CM | POA: Diagnosis not present

## 2015-03-21 DIAGNOSIS — R0602 Shortness of breath: Secondary | ICD-10-CM | POA: Diagnosis not present

## 2015-03-21 DIAGNOSIS — E876 Hypokalemia: Secondary | ICD-10-CM | POA: Diagnosis present

## 2015-03-21 DIAGNOSIS — S0990XA Unspecified injury of head, initial encounter: Secondary | ICD-10-CM | POA: Diagnosis not present

## 2015-03-21 DIAGNOSIS — Z7189 Other specified counseling: Secondary | ICD-10-CM | POA: Diagnosis present

## 2015-03-21 DIAGNOSIS — I429 Cardiomyopathy, unspecified: Secondary | ICD-10-CM | POA: Diagnosis present

## 2015-03-21 DIAGNOSIS — E875 Hyperkalemia: Secondary | ICD-10-CM | POA: Diagnosis not present

## 2015-03-21 DIAGNOSIS — Z981 Arthrodesis status: Secondary | ICD-10-CM | POA: Diagnosis not present

## 2015-03-21 DIAGNOSIS — I5022 Chronic systolic (congestive) heart failure: Secondary | ICD-10-CM | POA: Diagnosis not present

## 2015-03-21 DIAGNOSIS — E1165 Type 2 diabetes mellitus with hyperglycemia: Secondary | ICD-10-CM | POA: Diagnosis present

## 2015-03-21 DIAGNOSIS — Z7401 Bed confinement status: Secondary | ICD-10-CM

## 2015-03-21 DIAGNOSIS — F329 Major depressive disorder, single episode, unspecified: Secondary | ICD-10-CM | POA: Diagnosis present

## 2015-03-21 DIAGNOSIS — L89154 Pressure ulcer of sacral region, stage 4: Secondary | ICD-10-CM | POA: Diagnosis present

## 2015-03-21 DIAGNOSIS — R410 Disorientation, unspecified: Secondary | ICD-10-CM | POA: Diagnosis not present

## 2015-03-21 DIAGNOSIS — Z993 Dependence on wheelchair: Secondary | ICD-10-CM | POA: Diagnosis not present

## 2015-03-21 DIAGNOSIS — E785 Hyperlipidemia, unspecified: Secondary | ICD-10-CM | POA: Diagnosis present

## 2015-03-21 DIAGNOSIS — I10 Essential (primary) hypertension: Secondary | ICD-10-CM | POA: Diagnosis not present

## 2015-03-21 DIAGNOSIS — I270 Primary pulmonary hypertension: Secondary | ICD-10-CM | POA: Diagnosis not present

## 2015-03-21 DIAGNOSIS — E780 Pure hypercholesterolemia: Secondary | ICD-10-CM | POA: Diagnosis present

## 2015-03-21 DIAGNOSIS — Z95828 Presence of other vascular implants and grafts: Secondary | ICD-10-CM

## 2015-03-21 DIAGNOSIS — I129 Hypertensive chronic kidney disease with stage 1 through stage 4 chronic kidney disease, or unspecified chronic kidney disease: Secondary | ICD-10-CM | POA: Diagnosis present

## 2015-03-21 DIAGNOSIS — G9340 Encephalopathy, unspecified: Secondary | ICD-10-CM | POA: Diagnosis not present

## 2015-03-21 DIAGNOSIS — Z515 Encounter for palliative care: Secondary | ICD-10-CM

## 2015-03-21 DIAGNOSIS — Z6828 Body mass index (BMI) 28.0-28.9, adult: Secondary | ICD-10-CM | POA: Diagnosis not present

## 2015-03-21 DIAGNOSIS — Z794 Long term (current) use of insulin: Secondary | ICD-10-CM

## 2015-03-21 DIAGNOSIS — N179 Acute kidney failure, unspecified: Secondary | ICD-10-CM | POA: Diagnosis present

## 2015-03-21 DIAGNOSIS — I739 Peripheral vascular disease, unspecified: Secondary | ICD-10-CM | POA: Diagnosis present

## 2015-03-21 DIAGNOSIS — Z79899 Other long term (current) drug therapy: Secondary | ICD-10-CM

## 2015-03-21 DIAGNOSIS — L89899 Pressure ulcer of other site, unspecified stage: Secondary | ICD-10-CM | POA: Diagnosis present

## 2015-03-21 DIAGNOSIS — Z7902 Long term (current) use of antithrombotics/antiplatelets: Secondary | ICD-10-CM

## 2015-03-21 DIAGNOSIS — I5023 Acute on chronic systolic (congestive) heart failure: Secondary | ICD-10-CM

## 2015-03-21 DIAGNOSIS — D649 Anemia, unspecified: Secondary | ICD-10-CM | POA: Diagnosis present

## 2015-03-21 DIAGNOSIS — R011 Cardiac murmur, unspecified: Secondary | ICD-10-CM | POA: Diagnosis present

## 2015-03-21 LAB — CARBOXYHEMOGLOBIN
CARBOXYHEMOGLOBIN: 3.4 % — AB (ref 0.5–1.5)
METHEMOGLOBIN: 0.4 % (ref 0.0–1.5)
O2 SAT: 91.2 %
TOTAL HEMOGLOBIN: 9.9 g/dL — AB (ref 13.5–18.0)

## 2015-03-21 LAB — RAPID URINE DRUG SCREEN, HOSP PERFORMED
Amphetamines: NOT DETECTED
BARBITURATES: NOT DETECTED
Benzodiazepines: NOT DETECTED
Cocaine: NOT DETECTED
Opiates: NOT DETECTED
Tetrahydrocannabinol: NOT DETECTED

## 2015-03-21 LAB — URINALYSIS, ROUTINE W REFLEX MICROSCOPIC
Bilirubin Urine: NEGATIVE
Glucose, UA: NEGATIVE mg/dL
Hgb urine dipstick: NEGATIVE
Ketones, ur: NEGATIVE mg/dL
LEUKOCYTES UA: NEGATIVE
NITRITE: NEGATIVE
PH: 5.5 (ref 5.0–8.0)
Protein, ur: NEGATIVE mg/dL
SPECIFIC GRAVITY, URINE: 1.012 (ref 1.005–1.030)
Urobilinogen, UA: 1 mg/dL (ref 0.0–1.0)

## 2015-03-21 LAB — CBC WITH DIFFERENTIAL/PLATELET
BASOS ABS: 0 10*3/uL (ref 0.0–0.1)
Basophils Relative: 0 %
EOS ABS: 0.1 10*3/uL (ref 0.0–0.7)
Eosinophils Relative: 1 %
HEMATOCRIT: 29.9 % — AB (ref 39.0–52.0)
Hemoglobin: 9.6 g/dL — ABNORMAL LOW (ref 13.0–17.0)
LYMPHS ABS: 1.1 10*3/uL (ref 0.7–4.0)
Lymphocytes Relative: 11 %
MCH: 21.9 pg — ABNORMAL LOW (ref 26.0–34.0)
MCHC: 32.1 g/dL (ref 30.0–36.0)
MCV: 68.1 fL — ABNORMAL LOW (ref 78.0–100.0)
MONOS PCT: 12 %
Monocytes Absolute: 1.2 10*3/uL — ABNORMAL HIGH (ref 0.1–1.0)
Neutro Abs: 7.4 10*3/uL (ref 1.7–7.7)
Neutrophils Relative %: 76 %
PLATELETS: 312 10*3/uL (ref 150–400)
RBC: 4.39 MIL/uL (ref 4.22–5.81)
RDW: 20 % — AB (ref 11.5–15.5)
WBC: 9.8 10*3/uL (ref 4.0–10.5)

## 2015-03-21 LAB — BRAIN NATRIURETIC PEPTIDE: B Natriuretic Peptide: 824.5 pg/mL — ABNORMAL HIGH (ref 0.0–100.0)

## 2015-03-21 LAB — BLOOD GAS, ARTERIAL
ACID-BASE EXCESS: 8.6 mmol/L — AB (ref 0.0–2.0)
Bicarbonate: 31.6 mEq/L — ABNORMAL HIGH (ref 20.0–24.0)
Drawn by: 398981
O2 Content: 3 L/min
O2 SAT: 95.2 %
PATIENT TEMPERATURE: 98.6
PCO2 ART: 35.3 mmHg (ref 35.0–45.0)
TCO2: 32.7 mmol/L (ref 0–100)
pH, Arterial: 7.56 — ABNORMAL HIGH (ref 7.350–7.450)
pO2, Arterial: 73.4 mmHg — ABNORMAL LOW (ref 80.0–100.0)

## 2015-03-21 LAB — AMMONIA: AMMONIA: 31 umol/L (ref 9–35)

## 2015-03-21 LAB — HEPATIC FUNCTION PANEL
ALT: 18 U/L (ref 17–63)
AST: 37 U/L (ref 15–41)
Albumin: 2.6 g/dL — ABNORMAL LOW (ref 3.5–5.0)
Alkaline Phosphatase: 352 U/L — ABNORMAL HIGH (ref 38–126)
BILIRUBIN DIRECT: 1.4 mg/dL — AB (ref 0.1–0.5)
BILIRUBIN INDIRECT: 1.2 mg/dL — AB (ref 0.3–0.9)
Total Bilirubin: 2.6 mg/dL — ABNORMAL HIGH (ref 0.3–1.2)
Total Protein: 7.6 g/dL (ref 6.5–8.1)

## 2015-03-21 LAB — I-STAT CHEM 8, ED
BUN: 54 mg/dL — ABNORMAL HIGH (ref 6–20)
CREATININE: 2.2 mg/dL — AB (ref 0.61–1.24)
Calcium, Ion: 1.05 mmol/L — ABNORMAL LOW (ref 1.12–1.23)
Chloride: 86 mmol/L — ABNORMAL LOW (ref 101–111)
Glucose, Bld: 196 mg/dL — ABNORMAL HIGH (ref 65–99)
HEMATOCRIT: 37 % — AB (ref 39.0–52.0)
HEMOGLOBIN: 12.6 g/dL — AB (ref 13.0–17.0)
POTASSIUM: 3.6 mmol/L (ref 3.5–5.1)
SODIUM: 131 mmol/L — AB (ref 135–145)
TCO2: 32 mmol/L (ref 0–100)

## 2015-03-21 LAB — URIC ACID: Uric Acid, Serum: 14.8 mg/dL — ABNORMAL HIGH (ref 4.4–7.6)

## 2015-03-21 MED ORDER — MILRINONE IN DEXTROSE 20 MG/100ML IV SOLN
0.3750 ug/kg/min | INTRAVENOUS | Status: DC
  Administered 2015-03-22 – 2015-03-29 (×18): 0.375 ug/kg/min via INTRAVENOUS
  Filled 2015-03-21 (×18): qty 100

## 2015-03-21 MED ORDER — BUPROPION HCL ER (SR) 150 MG PO TB12
150.0000 mg | ORAL_TABLET | Freq: Two times a day (BID) | ORAL | Status: DC
  Administered 2015-03-22 – 2015-03-29 (×16): 150 mg via ORAL
  Filled 2015-03-21 (×16): qty 1

## 2015-03-21 MED ORDER — HEPARIN SODIUM (PORCINE) 5000 UNIT/ML IJ SOLN
5000.0000 [IU] | Freq: Three times a day (TID) | INTRAMUSCULAR | Status: DC
  Administered 2015-03-22 – 2015-03-29 (×23): 5000 [IU] via SUBCUTANEOUS
  Filled 2015-03-21 (×24): qty 1

## 2015-03-21 MED ORDER — OLOPATADINE HCL 0.1 % OP SOLN
1.0000 [drp] | Freq: Two times a day (BID) | OPHTHALMIC | Status: DC
  Administered 2015-03-22 – 2015-03-29 (×14): 1 [drp] via OPHTHALMIC
  Filled 2015-03-21: qty 5

## 2015-03-21 MED ORDER — ACETAMINOPHEN 325 MG PO TABS: 650.0000 mg | ORAL_TABLET | Freq: Four times a day (QID) | ORAL | Status: DC | PRN

## 2015-03-21 MED ORDER — FUROSEMIDE 10 MG/ML IJ SOLN: 120.0000 mg | Freq: Two times a day (BID) | INTRAVENOUS | Status: DC

## 2015-03-21 MED ORDER — VITAMIN B-12 1000 MCG PO TABS
1000.0000 ug | ORAL_TABLET | Freq: Every day | ORAL | Status: DC
  Administered 2015-03-22 – 2015-03-29 (×8): 1000 ug via ORAL
  Filled 2015-03-21 (×9): qty 1

## 2015-03-21 MED ORDER — INSULIN ASPART 100 UNIT/ML ~~LOC~~ SOLN
0.0000 [IU] | Freq: Three times a day (TID) | SUBCUTANEOUS | Status: DC
  Administered 2015-03-22: 2 [IU] via SUBCUTANEOUS
  Administered 2015-03-22: 1 [IU] via SUBCUTANEOUS
  Administered 2015-03-23: 3 [IU] via SUBCUTANEOUS
  Administered 2015-03-23 (×2): 2 [IU] via SUBCUTANEOUS
  Administered 2015-03-24 (×2): 1 [IU] via SUBCUTANEOUS
  Administered 2015-03-24: 2 [IU] via SUBCUTANEOUS
  Administered 2015-03-25 – 2015-03-26 (×3): 1 [IU] via SUBCUTANEOUS
  Administered 2015-03-26: 2 [IU] via SUBCUTANEOUS
  Administered 2015-03-28: 3 [IU] via SUBCUTANEOUS
  Administered 2015-03-29: 2 [IU] via SUBCUTANEOUS
  Administered 2015-03-29: 3 [IU] via SUBCUTANEOUS
  Administered 2015-03-29: 5 [IU] via SUBCUTANEOUS

## 2015-03-21 MED ORDER — DULOXETINE HCL 30 MG PO CPEP
30.0000 mg | ORAL_CAPSULE | Freq: Every day | ORAL | Status: DC
Start: 2015-03-22 — End: 2015-03-29
  Administered 2015-03-22 – 2015-03-29 (×8): 30 mg via ORAL
  Filled 2015-03-21 (×8): qty 1

## 2015-03-21 MED ORDER — PANTOPRAZOLE SODIUM 40 MG PO TBEC
40.0000 mg | DELAYED_RELEASE_TABLET | Freq: Every day | ORAL | Status: DC
  Administered 2015-03-22 – 2015-03-29 (×8): 40 mg via ORAL
  Filled 2015-03-21 (×6): qty 1

## 2015-03-21 MED ORDER — SODIUM CHLORIDE 0.9 % IJ SOLN
3.0000 mL | Freq: Two times a day (BID) | INTRAMUSCULAR | Status: DC
  Administered 2015-03-22 – 2015-03-23 (×3): 3 mL via INTRAVENOUS

## 2015-03-21 MED ORDER — SENNOSIDES-DOCUSATE SODIUM 8.6-50 MG PO TABS: 1.0000 | ORAL_TABLET | Freq: Every evening | ORAL | Status: DC | PRN

## 2015-03-21 MED ORDER — ALBUTEROL SULFATE (2.5 MG/3ML) 0.083% IN NEBU: 2.5000 mg | INHALATION_SOLUTION | Freq: Four times a day (QID) | RESPIRATORY_TRACT | Status: DC | PRN

## 2015-03-21 MED ORDER — ISOSORBIDE MONONITRATE 15 MG HALF TABLET
15.0000 mg | ORAL_TABLET | Freq: Every day | ORAL | Status: DC
  Administered 2015-03-22: 15 mg via ORAL
  Filled 2015-03-21 (×3): qty 1

## 2015-03-21 MED ORDER — PANCRELIPASE (LIP-PROT-AMYL) 12000-38000 UNITS PO CPEP
3.0000 | ORAL_CAPSULE | Freq: Three times a day (TID) | ORAL | Status: DC
  Administered 2015-03-22 – 2015-03-29 (×22): 36000 [IU] via ORAL
  Filled 2015-03-21 (×25): qty 3

## 2015-03-21 MED ORDER — ALTEPLASE 2 MG IJ SOLR
2.0000 mg | Freq: Once | INTRAMUSCULAR | Status: AC
Start: 2015-03-21 — End: 2015-03-21
  Administered 2015-03-21: 2 mg
  Filled 2015-03-21: qty 2

## 2015-03-21 MED ORDER — FUROSEMIDE 10 MG/ML IJ SOLN
120.0000 mg | Freq: Two times a day (BID) | INTRAVENOUS | Status: DC
  Administered 2015-03-22: 120 mg via INTRAVENOUS
  Filled 2015-03-21 (×3): qty 12

## 2015-03-21 MED ORDER — SODIUM CHLORIDE 0.9 % IJ SOLN: 3.0000 mL | INTRAMUSCULAR | Status: DC | PRN

## 2015-03-21 MED ORDER — ONDANSETRON HCL 4 MG PO TABS: 4.0000 mg | ORAL_TABLET | Freq: Four times a day (QID) | ORAL | Status: DC | PRN

## 2015-03-21 MED ORDER — INSULIN GLARGINE 100 UNIT/ML ~~LOC~~ SOLN
16.0000 [IU] | Freq: Two times a day (BID) | SUBCUTANEOUS | Status: DC
  Administered 2015-03-22 – 2015-03-27 (×10): 16 [IU] via SUBCUTANEOUS
  Filled 2015-03-21 (×14): qty 0.16

## 2015-03-21 MED ORDER — SODIUM CHLORIDE 0.9 % IJ SOLN
3.0000 mL | Freq: Two times a day (BID) | INTRAMUSCULAR | Status: DC
  Administered 2015-03-22 – 2015-03-23 (×4): 3 mL via INTRAVENOUS

## 2015-03-21 MED ORDER — MILRINONE IN DEXTROSE 20 MG/100ML IV SOLN
0.3750 ug/kg/min | INTRAVENOUS | Status: DC
  Filled 2015-03-21: qty 100

## 2015-03-21 MED ORDER — ALBUTEROL SULFATE HFA 108 (90 BASE) MCG/ACT IN AERS: 1.0000 | INHALATION_SPRAY | Freq: Four times a day (QID) | RESPIRATORY_TRACT | Status: DC | PRN

## 2015-03-21 MED ORDER — ACETAMINOPHEN 650 MG RE SUPP: 650.0000 mg | Freq: Four times a day (QID) | RECTAL | Status: DC | PRN

## 2015-03-21 MED ORDER — MAGNESIUM OXIDE 400 (241.3 MG) MG PO TABS
400.0000 mg | ORAL_TABLET | Freq: Two times a day (BID) | ORAL | Status: DC
  Administered 2015-03-22 – 2015-03-29 (×16): 400 mg via ORAL
  Filled 2015-03-21 (×17): qty 1

## 2015-03-21 MED ORDER — CLOPIDOGREL BISULFATE 75 MG PO TABS
75.0000 mg | ORAL_TABLET | Freq: Every day | ORAL | Status: DC
  Administered 2015-03-22 – 2015-03-29 (×8): 75 mg via ORAL
  Filled 2015-03-21 (×9): qty 1

## 2015-03-21 MED ORDER — ATORVASTATIN CALCIUM 40 MG PO TABS
40.0000 mg | ORAL_TABLET | Freq: Every day | ORAL | Status: DC
  Administered 2015-03-22 – 2015-03-29 (×9): 40 mg via ORAL
  Filled 2015-03-21 (×10): qty 1

## 2015-03-21 MED ORDER — MUSCLE RUB 10-15 % EX CREA
TOPICAL_CREAM | CUTANEOUS | Status: DC | PRN
  Filled 2015-03-21: qty 85

## 2015-03-21 MED ORDER — SODIUM CHLORIDE 0.9 % IV SOLN: 250.0000 mL | INTRAVENOUS | Status: DC | PRN

## 2015-03-21 MED ORDER — HYDRALAZINE HCL 10 MG PO TABS
10.0000 mg | ORAL_TABLET | Freq: Three times a day (TID) | ORAL | Status: DC
  Administered 2015-03-22 – 2015-03-23 (×5): 10 mg via ORAL
  Filled 2015-03-21 (×9): qty 1

## 2015-03-21 MED ORDER — ONDANSETRON HCL 4 MG/2ML IJ SOLN: 4.0000 mg | Freq: Four times a day (QID) | INTRAMUSCULAR | Status: DC | PRN

## 2015-03-21 NOTE — ED Notes (Signed)
IV team at bedside 

## 2015-03-21 NOTE — ED Notes (Signed)
Contacted IV team regarding picc line.  IV Nurse Arline Asp informed RN that it could be accessed.  RN attempted to access picc line however it would not draw back.  RN placed an IV consult to inform them of the status.  Lab tec. to draw labs.

## 2015-03-21 NOTE — ED Notes (Signed)
Pt placed in a gown and hooked up to the monitor with a 5 lead, BP cuff and pulse ox 

## 2015-03-21 NOTE — H&P (Signed)
[facility] History and Physical                                                                                    [name], is a 54 y.o. male  MRN: [id number]   DOB - [birth year]  Admit Date - 03/21/2015  Outpatient Primary MD for the patient is Dorrene German, MD  Referring Physician:    Chief Complaint:   Chief Complaint  Patient presents with  . Shortness of Breath     HPI  Christian Wall  is a 54 y.o. 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 He is being admitted for acute encephalopathy and acute on chronic systolic heart failure. He was just discharged from cardiology's service on 03/13/2015 after being treated for acute on chronic systolic heart failure.  His wife said he was doing fairly well until Sunday when he began to become confused. His wife is extremely concerned. She states he is talking out of his head. She is unable to care for him at home in this state. His oxygen saturation was 86% on room air. His wife reports he is not on any new medications he did take 1 hydrocodone yesterday for knee pain. She reports that he does not drink or use any recreational drugs. He has never had any issues with liver disease. She reports that he is urinating less and it appears dark. His appetite has also dropped off. He had one episode of vomiting just after discharge last week. Otherwise he has not had any fever, cough, chest pain, abdominal pain, dysuria, diarrhea, myalgias.  In the emergency department his WBC is within normal limits, BNP is 824, creatinine is 2.2 (elevated from previous but still within his baseline), BUN is slightly elevated. Chest x-ray shows bilateral airspace disease-pulmonary edema versus infection.   Review of Systems   In addition to the HPI above,  No Fever-chills, No Headache, No changes with Vision or hearing, No problems swallowing food or  Liquids, No Abdominal pain, No Nausea or Vomiting, Bowel movements are regular, No new skin rashes or bruises, No new joints pains-aches,  No new weakness, tingling, numbness in any extremity,  A full 10 point Review of Systems was done, except as stated above, all other Review of Systems were negative.  Past Medical History  Past Medical History  Diagnosis Date  . Diabetes mellitus   . Atrophy of calf muscles   . Depression   . GERD (gastroesophageal reflux disease)   . Peripheral neuropathy   . Hypercholesteremia   . Hx of tracheostomy   . Chronic systolic CHF (congestive heart failure), NYHA class 3     Past Surgical History  Procedure Laterality Date  . Pancreas surgery  2001    hx pancreatitis  . Anterior cervical decomp/discectomy fusion  04/13/2012    Procedure: ANTERIOR CERVICAL DECOMPRESSION/DISCECTOMY FUSION 1 LEVEL;  Surgeon: Temple Pacini, MD;  Location: [facility];  Service: Neurosurgery;  Laterality: N/A;  Cervcial Five-Six Anterior Cervical Decompression and Fusion with Allograft and Plating  . Cardiac catheterization N/A 11/15/2014  Procedure: Right Heart Cath;  Surgeon: Dolores Patty, MD;  Location: [facility] INVASIVE CV LAB;  Service: Cardiovascular;  Laterality: N/A;  . Peripheral vascular catheterization N/A 02/26/2015    Procedure: Abdominal Aortogram;  Surgeon: Chuck Hint, MD;  Location: [facility] INVASIVE CV LAB;  Service: Cardiovascular;  Laterality: N/A;      Social History Social History  Substance Use Topics  . Smoking status: Current Every Day Smoker -- 1.00 packs/day for 30 years    Types: Cigarettes  . Smokeless tobacco: Never Used  . Alcohol Use: No     Comment: Quit after his brother's death in 11/03/05 or 8    lives at home with his wife. Unable to ambulate.  Family History Family History  Problem Relation Age of Onset  . Diabetes Mellitus II Sister   . CAD Brother   . Cancer Mother     ? type   . Cancer Father     ? type     Prior to Admission medications   Medication Sig Start Date End Date Taking? Authorizing Provider  acetaminophen (TYLENOL) 500 MG tablet Take 2 tablets (1,000 mg total) by mouth every 6 (six) hours as needed for mild pain or headache. 11/21/14  Yes Rhonda G Barrett, PA-C  atorvastatin (LIPITOR) 40 MG tablet Take 1 tablet (40 mg total) by mouth daily at 6 PM. 03/01/15  Yes Amy D Clegg, NP  bisacodyl (DULCOLAX) 5 MG EC tablet Take 5 mg by mouth daily as needed for mild constipation or moderate constipation.   Yes Historical Provider, MD  buPROPion (WELLBUTRIN SR) 150 MG 12 hr tablet Take 150 mg by mouth 2 (two) times daily. 01/29/15  Yes Historical Provider, MD  clopidogrel (PLAVIX) 75 MG tablet Take 1 tablet (75 mg total) by mouth daily. 03/01/15  Yes Amy D Clegg, NP  [facility] VITAMIN B12 1000 MCG tablet Take 1,000 mcg by mouth daily. 10/15/14  Yes Historical Provider, MD  DULoxetine (CYMBALTA) 30 MG capsule Take 1 capsule (30 mg total) by mouth daily. 02/04/15  Yes Drema Dallas, MD  esomeprazole (NEXIUM) 40 MG capsule Take 40 mg by mouth daily before breakfast.   Yes Historical Provider, MD  ferrous sulfate 325 (65 FE) MG EC tablet Take 1 tablet (325 mg total) by mouth daily with breakfast. 03/08/15  Yes Amy D Clegg, NP  hydrALAZINE (APRESOLINE) 25 MG tablet Take 1 tablet (25 mg total) by mouth every 8 (eight) hours. 03/01/15  Yes Amy D Clegg, NP  insulin aspart (NOVOLOG) 100 UNIT/ML injection Inject 0-15 Units into the skin 3 (three) times daily with meals. CBG 70 - 120: 0 units CBG 121 - 150: 2 units CBG 151 - 200: 3 units CBG 201 - 250: 5 units CBG 251 - 300: 8 units CBG 301 - 350: 11 units CBG 351 - 400: 15 units Patient taking differently: Inject 0-15 Units into the skin 3 (three) times daily as needed for high blood sugar. CBG 70 - 120: 0 units CBG 121 - 150: 2 units CBG 151 - 200: 3 units CBG 201 - 250: 5 units CBG 251 - 300: 8 units CBG 301 - 350: 11 units CBG 351 - 400: 15 units 11/13/13   Yes Maryann Mikhail, DO  insulin glargine (LANTUS) 100 UNIT/ML injection Inject 0.2 mLs (20 Units total) into the skin 2 (two) times daily. 03/13/15  Yes Amy D Clegg, NP  isosorbide mononitrate (IMDUR) 30 MG 24 hr tablet Take 1 tablet (30 mg total) by  mouth daily. 02/04/15  Yes Drema Dallas, MD  lipase/protease/amylase (CREON-10/PANCREASE) 12000 UNITS CPEP Take 1-3 capsules by mouth 2 (two) times daily. 3 capsules before meal, and 1 capsule before snack   Yes Historical Provider, MD  magnesium oxide (MAG-OX) 400 (241.3 MG) MG tablet Take 400 mg by mouth 2 (two) times daily. 01/26/15  Yes Historical Provider, MD  milrinone (PRIMACOR) 20 MG/100ML SOLN infusion Inject 31.8375 mcg/min into the vein continuous. Per [facility]. 12 months 03/01/15  Yes Amy D Clegg, NP  Olopatadine HCl (PATADAY) 0.2 % SOLN Apply 1 drop to eye daily as needed. For allergies   Yes Historical Provider, MD  oxyCODONE (OXY IR/ROXICODONE) 5 MG immediate release tablet Take 5 mg by mouth at bedtime as needed for severe pain.   Yes Historical Provider, MD  pregabalin (LYRICA) 150 MG capsule Take 150 mg by mouth 2 (two) times daily.   Yes Historical Provider, MD  tiZANidine (ZANAFLEX) 4 MG tablet Take 4 mg by mouth 2 (two) times daily as needed for muscle spasms.   Yes Historical Provider, MD  torsemide (DEMADEX) 20 MG tablet Take 1 tablet (20 mg total) by mouth 2 (two) times daily. Start the 20 mg twice a day. Start 9/6 at 6 pm 03/13/15  Yes Amy D Clegg, NP  traMADol (ULTRAM) 50 MG tablet Take 50 mg by mouth every 6 (six) hours as needed for moderate pain.    Yes Historical Provider, MD  VENTOLIN HFA 108 (90 BASE) MCG/ACT inhaler Inhale 1-2 puffs into the lungs every 6 (six) hours as needed for wheezing or shortness of breath.  02/15/15  Yes Historical Provider, MD    No Known Allergies  Physical Exam  Vitals  Blood pressure 110/68, pulse 87, temperature 98.1 F (36.7 C), temperature source Oral, resp. rate 14, SpO2 100 %.   General:  Well-developed male lying in bed in NAD, confused, unable to move lower extremities.  Psych:  Pleasantly confused, cooperative, slightly sleepy  Neuro:   No acute focal cranial nerve deficits. Unable to ambulate due to known atrophy of Muscles.  ENT:  Ears and Eyes appear Normal, Conjunctivae clear, PER. Moist oral mucosa without erythema or exudates.  Neck:  Supple, No lymphadenopathy appreciated  Respiratory:  Symmetrical chest wall movement, Good air movement bilaterally, CTAB.  Cardiac:  RRR, No Murmurs, 1-2+ lower extremity edema bilaterally, + JVP    Abdomen:  Positive bowel sounds, Soft, Non tender, Non distended,  No masses appreciated  Skin:  No Cyanosis, Normal Skin Turgor, No Skin Rash or Bruise.  Extremities:  Able to move all 4. 5/5 strength in each,  no effusions. Right knee is nontender but appears asymmetrically larger than left.  Data Review  CBC  Recent Labs Lab 03/21/15 1200 03/21/15 1207  WBC 9.8  --   HGB 9.6* 12.6*  HCT 29.9* 37.0*  PLT 312  --   MCV 68.1*  --   MCH 21.9*  --   MCHC 32.1  --   RDW 20.0*  --   LYMPHSABS 1.1  --   MONOABS 1.2*  --   EOSABS 0.1  --   BASOSABS 0.0  --     Chemistries   Recent Labs Lab 03/21/15 1207  NA 131*  K 3.6  CL 86*  GLUCOSE 196*  BUN 54*  CREATININE 2.20*     Urinalysis: Pending  Imaging results:   Dg Chest 2 View  03/21/2015   CLINICAL DATA:  Shortness of breath.  EXAM: CHEST  2 VIEW  COMPARISON:  03/11/2015  FINDINGS: Right PICC line is in place with the tip in the SVC, stable. Cardiomegaly with bilateral airspace opacities, increased since prior study. No effusions. No acute bony abnormality.  IMPRESSION: Worsening diffuse bilateral airspace disease, which could represent edema or infection. Favor edema.   Electronically Signed   By: Charlett Nose M.D.   On: 03/21/2015 15:24   Dg Chest 2 View  03/11/2015   CLINICAL DATA:  Acute shortness of breath.  History of CHF.  EXAM: CHEST  2 VIEW   COMPARISON:  02/22/2015 and prior exams  FINDINGS: Cardiomegaly and mild interstitial pulmonary edema noted, increased from the prior study.  A right PICC line is noted with tip overlying the mid mid-upper SVC.  There is no evidence of pneumothorax or pleural effusion.  No acute bony abnormalities are noted.  IMPRESSION: Cardiomegaly with mild-moderate interstitial pulmonary edema.   Electronically Signed   By: Harmon Pier M.D.   On: 03/11/2015 08:48   Dg Chest Port 1 View  02/22/2015   CLINICAL DATA:  Nonproductive cough, dyspnea, hypoglycemia, renal failure.  EXAM: PORTABLE CHEST - 1 VIEW  COMPARISON:  Portable chest x-ray of January 30, 2015  FINDINGS: The lungs are adequately inflated. There is subsegmental atelectasis or infiltrate just above the right hemidiaphragm. The left lung is clear The cardiac silhouette remains enlarged. There is mild central pulmonary vascular prominence. The PICC line tip projects over the midportion of the SVC. There is degenerative change of the core though clavicular articulation.  IMPRESSION: Infiltrate or atelectasis at the right lung base slightly more conspicuous today. Low-grade compensated CHF.   Electronically Signed   By: David  [place] M.D.   On: 02/22/2015 08:37    My personal review of EKG: EKG is not significantly changed from previous. QT is not prolonged. Sinus rhythm.   Assessment & Plan  Principal Problem:   Acute encephalopathy Active Problems:   Acute on chronic systolic CHF (congestive heart failure)   Essential hypertension   HLD (hyperlipidemia)   CKD (chronic kidney disease) stage 3, GFR 30-59 ml/min   Acute encephalopathy  Symptoms began Sunday 9/11. Patient is awake but confused. Oriented to person.  He has only urinated once today. UA has not been collected. Have requested an out cath for UA. Suspicious of UTI.  Per his wife he has never had liver disease. He is not having headaches or changes in vision.  UA pending, check blood  cultures, check LFTs, UDS pending, ammonia level pending.   If we don't have a source based on these results will move forward with the CT head.   Chest x-ray today showed pulmonary edema versus infection. We will repeat tomorrow after diuresis to evaluate again for possible infection.   Have requested that pharmacy examine his medications for a possible source.  I do not see any medications on the list that would necessarily cause encephalopathy. The patient reportedly took 1 hydrocodone yesterday. Will hold narcotics and any medications that could contribute to confusion.  Will start empiric lactulose twice a day. Admit patient to stepdown.  Acute on chronic systolic congestive heart failure  Just discharged last week from [facility] after a heart failure exacerbation. Patient's EF is 15-20%.   He is on chronic milrinone drip.    Chest x-ray indicates pulmonary edema. Will check a follow-up x-ray tomorrow after he has been diuresed some to determine if there is improvement.   We appreciate cardiology's management of his heart  failure.    Will order continuation of milrinone, lasix IV 120 bid, imdur 15, and hydralazine 10 mg tid at their direction.   Essential HTN  Please see above.  Management per cardiology.   Beta blocker held due to low output.   Hyperlipidemia  Continue statin.  If LFTs are elevated please DC statin.   Chronic kidney disease stage III  Most recent creatinine prior to today was 1.6 however it appears the patient's baseline creatinine is approximately 2.5.  He is currently at baseline.   Right knee pain  Hold narcotic pain meds.  Crista Elliot, [employer].  Check uric acid   Wounds   Sacral and lower extremity  Wound Care RN assistance requested.   Diabetes mellitus with peripheral neuropathy  Moderately controlled.  Continue Lantus at slightly decreased dose, add SSI-sensitive, carb mod diet    Consultants Called:  Cardiology, Palliative  medicine consult requested.  Family Communication:   Wife at bedside  Code Status:  Full code. Wife is tearful when I asked CODE STATUS. Her father died [facility] at [facility] can last week. Patient needs a palliative medicine consult. He had one in May 2016 and has remained a full code  Condition:  Very guarded  Potential Disposition: To home in 5-7 days when improved.  Time spent in minutes : 60   Algis Downs,  PA-C on 03/21/2015 at 5:47 PM Between 7am to 7pm - Pager - [phone] After 7pm go to [email or web address] - password [email or web address] And look for the night coverage person covering me after hours  [facility]

## 2015-03-21 NOTE — Telephone Encounter (Signed)
[facility] RN called to let us know that Mr Baptist isn't himself this am.  He is pretty confused; crackles in bases of lungs, and abd girth. O2 sats is 84% on RA. CBG this am is 142.  Per wife urine is dark and very little output.  HH RN is going to send pt to ER.

## 2015-03-21 NOTE — Consult Note (Signed)
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 He is on chronic home milrinone at 0.360mcg.   Most recent admit September 4th with hypoglycemia. Insulin adjusted and he was discharged home.He continued on milrinone 0.375 mcg.  From HF perspective he was stable. Discharge weight was 187 pounds.   He presented to [facility] ED with AMS. His wife reports increased confusion and dyspnea on exertion for the last few days. Larey Seat out of bed earlier this morning. He was unable to get off the floor and required 2 person assist. Has been taking medications. He continues on home milrinone. No fever or chills.  Today CXR with worsening edema.  Pertinent admission labs include: glucose 196, creatinine 2.2, K 3.6, WBC 9.8, and BNP 824.  Review of Systems: [y] = yes,  = no   General: Weight gain [ Y]; Weight loss ; Anorexia ; Fatigue [Y ]; Fever ; Chills ; Weakness [Y ]  Cardiac: Chest pain/pressure ; Resting SOB ; Exertional SOB [Y ]; Orthopnea [Y ]; Pedal Edema [Y ]; Palpitations ; Syncope ; Presyncope ; Paroxysmal nocturnal dyspnea[ ]   Pulmonary: Cough ; Wheezing[ ] ; Hemoptysis[ ] ; Sputum ; Snoring   GI: Vomiting[ ] ; Dysphagia[ ] ; Melena[ ] ; Hematochezia ; Heartburn[ ] ; Abdominal pain ; Constipation ; Diarrhea ; BRBPR   GU: Hematuria[ ] ; Dysuria ; Nocturia[ ]   Vascular: Pain in legs with walking [Y ]; Pain in feet with lying flat ; Non-healing sores ; Stroke ; TIA ; Slurred speech ;  Neuro: Headaches[ ] ; Vertigo[ ] ; Seizures[ ] ;  Paresthesias[ ] ;Blurred vision ; Diplopia ; Vision changes   Ortho/Skin: Arthritis ; Joint pain ; Muscle pain ; Joint swelling [Y ]; Back Pain ; Rash   Psych: Depression[Y ]; Anxiety[ ]   Heme: Bleeding problems ; Clotting disorders ; Anemia   Endocrine: Diabetes [ Y]; Thyroid dysfunction[ ]   Home Medications Prior to Admission medications   Medication Sig Start Date End Date Taking? Authorizing Provider  acetaminophen (TYLENOL) 500 MG tablet Take 2 tablets (1,000 mg total) by mouth every 6 (six) hours as needed for mild pain or headache. 11/21/14   Joline Salt Barrett, PA-C  atorvastatin (LIPITOR) 40 MG tablet Take 1 tablet (40 mg total) by mouth daily at 6 PM. 03/01/15   Amy D Clegg, NP  bisacodyl (DULCOLAX) 5 MG EC tablet Take 5 mg by mouth daily as needed for mild constipation or moderate constipation.    Historical Provider, MD  buPROPion (WELLBUTRIN SR) 150 MG 12 hr tablet Take 150 mg by mouth 2 (two) times daily. 01/29/15   Historical Provider, MD  clopidogrel (PLAVIX) 75 MG tablet Take 1 tablet (75 mg total) by mouth daily. 03/01/15   Amy Georgie Chard, NP  [employer] VITAMIN B12 1000 MCG tablet Take 1,000 mcg by mouth daily. 10/15/14   Historical Provider, MD  DULoxetine (CYMBALTA) 30 MG capsule Take 1 capsule (30 mg total) by mouth daily.  02/04/15   Drema Dallas, MD  esomeprazole (NEXIUM) 40 MG capsule Take 40 mg by mouth daily before breakfast.    Historical Provider, MD  ferrous sulfate 325 (65 FE) MG EC tablet Take 1 tablet (325 mg total) by mouth daily with breakfast. 03/08/15   Amy D Clegg, NP  hydrALAZINE (APRESOLINE) 25 MG tablet Take 1 tablet (25 mg total) by mouth every 8 (eight) hours. 03/01/15   Amy D Filbert Schilder, NP  insulin aspart (NOVOLOG) 100 UNIT/ML injection Inject 0-15 Units into the skin 3 (three) times daily with meals. CBG 70 - 120: 0 units CBG 121 - 150: 2 units CBG 151 - 200: 3 units CBG 201 - 250: 5 units CBG 251 - 300: 8 units CBG 301 - 350: 11 units CBG  351 - 400: 15 units Patient taking differently: Inject 0-15 Units into the skin 3 (three) times daily as needed for high blood sugar. CBG 70 - 120: 0 units CBG 121 - 150: 2 units CBG 151 - 200: 3 units CBG 201 - 250: 5 units CBG 251 - 300: 8 units CBG 301 - 350: 11 units CBG 351 - 400: 15 units 11/13/13   Maryann Mikhail, DO  insulin glargine (LANTUS) 100 UNIT/ML injection Inject 0.2 mLs (20 Units total) into the skin 2 (two) times daily. 03/13/15   Amy D Filbert Schilder, NP  isosorbide mononitrate (IMDUR) 30 MG 24 hr tablet Take 1 tablet (30 mg total) by mouth daily. 02/04/15   Drema Dallas, MD  lipase/protease/amylase (CREON-10/PANCREASE) 12000 UNITS CPEP Take 1-3 capsules by mouth 2 (two) times daily. 3 capsules before meal, and 1 capsule before snack    Historical Provider, MD  magnesium oxide (MAG-OX) 400 (241.3 MG) MG tablet Take 400 mg by mouth 2 (two) times daily. 01/26/15   Historical Provider, MD  milrinone (PRIMACOR) 20 MG/100ML SOLN infusion Inject 31.8375 mcg/min into the vein continuous. Per [facility]. 12 months 03/01/15   Sherald Hess, NP  Olopatadine HCl (PATADAY) 0.2 % SOLN Apply 1 drop to eye daily as needed. For allergies    Historical Provider, MD  OXYCODONE HCL PO Take 1 tablet by mouth daily as needed (pain).    Historical Provider, MD  pregabalin (LYRICA) 150 MG capsule Take 150 mg by mouth 2 (two) times daily.    Historical Provider, MD  tiZANidine (ZANAFLEX) 4 MG tablet Take 4 mg by mouth 2 (two) times daily as needed for muscle spasms.    Historical Provider, MD  torsemide (DEMADEX) 20 MG tablet Take 1 tablet (20 mg total) by mouth 2 (two) times daily. Start the 20 mg twice a day. Start 9/6 at 6 pm 03/13/15   Amy D Filbert Schilder, NP  traMADol (ULTRAM) 50 MG tablet Take 50 mg by mouth every 6 (six) hours as needed for moderate pain.     Historical Provider, MD  VENTOLIN HFA 108 (90 BASE) MCG/ACT inhaler Inhale 1-2 puffs into the lungs every 6 (six) hours as needed for wheezing or shortness of breath.   02/15/15   Historical Provider, MD    Past Medical History: Past Medical History  Diagnosis Date  . Diabetes mellitus   . Atrophy of calf muscles   . Depression   . GERD (gastroesophageal reflux disease)   . Peripheral neuropathy   . Hypercholesteremia   . Hx of tracheostomy   . Chronic systolic CHF (congestive heart failure), NYHA class 3     Past Surgical History: Past Surgical History  Procedure Laterality  Date  . Pancreas surgery  11/09/99    hx pancreatitis  . Anterior cervical decomp/discectomy fusion  04/13/2012    Procedure: ANTERIOR CERVICAL DECOMPRESSION/DISCECTOMY FUSION 1 LEVEL;  Surgeon: Temple Pacini, MD;  Location: [facility];  Service: Neurosurgery;  Laterality: N/A;  Cervcial Five-Six Anterior Cervical Decompression and Fusion with Allograft and Plating  . Cardiac catheterization N/A 11/15/2014    Procedure: Right Heart Cath;  Surgeon: Dolores Patty, MD;  Location: [facility] INVASIVE CV LAB;  Service: Cardiovascular;  Laterality: N/A;  . Peripheral vascular catheterization N/A 02/26/2015    Procedure: Abdominal Aortogram;  Surgeon: Chuck Hint, MD;  Location: [facility] INVASIVE CV LAB;  Service: Cardiovascular;  Laterality: N/A;    Family History: Family History  Problem Relation Age of Onset  . Diabetes Mellitus II Sister   . CAD Brother   . Cancer Mother     ? type   . Cancer Father     ? type    Social History: Social History   Social History  . Marital Status: Married    Spouse Name: N/A  . Number of Children: N/A  . Years of Education: N/A   Occupational History  . disabled    Social History Main Topics  . Smoking status: Current Every Day Smoker -- 1.00 packs/day for 30 years    Types: Cigarettes  . Smokeless tobacco: Never Used  . Alcohol Use: No     Comment: Quit after his brother's death in 2005-11-08 or 8   . Drug Use: No  . Sexual Activity: Yes   Other Topics Concern  . None   Social History Narrative   Lives in [place] with wife,  uses crutches or wheelchair to get around    Allergies:  No Known Allergies  Objective:    Vital Signs:   Temp:  [98.1 F (36.7 C)] 98.1 F (36.7 C) (09/14 1127) Pulse Rate:  [69-90] 87 (09/14 1445) Resp:  [13-21] 14 (09/14 1445) BP: (95-124)/(66-87) 110/68 mmHg (09/14 1445) SpO2:  [93 %-100 %] 100 % (09/14 1445)    Weight change: There were no vitals filed for this visit.  Intake/Output:  No intake or output data in the 24 hours ending 03/21/15 1550   Physical Exam: General:  Chronically ill appearing. No resp difficulty Somnolent but awkens and follows commands HEENT: normal Neck: supple. JVP to ear  . Carotids 2+ bilat; no bruits. No lymphadenopathy or thryomegaly appreciated. Cor: PMI nondisplaced. Regular rate & rhythm. No rubs, gallops or murmurs. Lungs: clear Abdomen: soft, nontender, nondistended. No hepatosplenomegaly. No bruits or masses. Good bowel sounds. Extremities: no cyanosis, clubbing, rash, R and LLE 2+ Extremities warm. RUE PICC  Neuro: Drowsy. Alert x to self. cranial nerves grossly intact. moves all 4 extremities w/o difficulty. Weak lower extremities. Affect pleasant. + asterixis  Telemetry: SR 90 BPM   Labs: Basic Metabolic Panel:  Recent Labs Lab 03/21/15 1207  NA 131*  K 3.6  CL 86*  GLUCOSE 196*  BUN 54*  CREATININE 2.20*    Liver Function Tests: No results for input(s): AST, ALT, ALKPHOS, BILITOT, PROT, ALBUMIN in the last 168 hours. No results for input(s): LIPASE, AMYLASE in the last 168 hours. No results for input(s): AMMONIA in the last 168 hours.  CBC:  Recent Labs Lab 03/21/15 1200 03/21/15 1207  WBC 9.8  --   NEUTROABS 7.4  --   HGB 9.6* 12.6*  HCT 29.9* 37.0*  MCV 68.1*  --   PLT 312  --  Cardiac Enzymes: No results for input(s): CKTOTAL, CKMB, CKMBINDEX, TROPONINI in the last 168 hours.  BNP: BNP (last 3 results)  Recent Labs  02/21/15 1415 03/11/15 1702 03/21/15 1200  BNP 1554.1* 1184.5* 824.5*     ProBNP (last 3 results) No results for input(s): PROBNP in the last 8760 hours.   CBG: No results for input(s): GLUCAP in the last 168 hours.  Coagulation Studies: No results for input(s): LABPROT, INR in the last 72 hours.  Other results: EKG:NSR 90 bpm   Imaging: Dg Chest 2 View  03/21/2015   CLINICAL DATA:  Shortness of breath.  EXAM: CHEST  2 VIEW  COMPARISON:  03/11/2015  FINDINGS: Right PICC line is in place with the tip in the SVC, stable. Cardiomegaly with bilateral airspace opacities, increased since prior study. No effusions. No acute bony abnormality.  IMPRESSION: Worsening diffuse bilateral airspace disease, which could represent edema or infection. Favor edema.   Electronically Signed   By: Charlett Nose M.D.   On: 03/21/2015 15:24      Medications:     Current Medications:     Infusions:     Assessment:  1.AMS - likely hepatic encphalopathy 2. A/C Systolic Heart Failure  3. CKD Stage III 4. DMII 5. H/P PAD Stent placed R common iliac 02/2015 6 Current tobacco   Plan/Discussion:    Christian Wall is a 54 year old with chronic biventricular heart failure on chronic home milrinone 0.375 mcg presenting [facility] ED with AMS --presumed hepatic encephalopathy. Ammonia level pending.   From HF perspective he does have volume overload. Historically has been difficult to manage due to RV failure and CKD.  For now continue milrinone 0.375 mcg. Check CO-OX now. Give lasix 120 mg IV bid.No BB with low output. Place on hydralazine 10 mg tid and 15 mg Imdur.  He has not been a candidate for advanced therapies due to social situation as well as CKD.  Please place on SDU for CVPs to guide diuresis.    Length of Stay:   CLEGG,AMY NP-C  03/21/2015, 3:50 PM  Advanced Heart Failure Team Pager [phone] (M-F; 7a - 4p)  Please contact [facility] for night-coverage after hours (4p -7a ) and weekends on [email or web address]  Patient seen and examined with [name], NP. We  discussed all aspects of the encounter. I agree with the assessment and plan as stated above.   Main issue is hepatic encephalopathy. He will be admitted to [facility] and lactulose started. Will send ammonia level.   HF relatively stable though he remains somewhat volume overloaded. Will start IV lasix. Continue milrinone. Check co-ox and CVP. R foot wound continues to heal. No evidence of acute infection on exam. We will follow.  Leeloo Silverthorne,MD 5:14 PM

## 2015-03-21 NOTE — ED Notes (Signed)
Pt unable to ambulate due to weakness

## 2015-03-21 NOTE — ED Notes (Signed)
Pt hooked back up to the monitor with a 5 lead, BP cuff and pulse ox 

## 2015-03-21 NOTE — ED Provider Notes (Signed)
CSN: [id number]     Arrival date & time 03/21/15  Nov 13, 1117 History   First MD Initiated Contact with Patient 03/21/15 1130     Chief Complaint  Patient presents with  . Shortness of Breath     (Consider location/radiation/quality/duration/timing/severity/associated sxs/prior Treatment) HPI Patient noted to be short of breath this morning by his wife. And she stated that he appeared confused. He was talking to his dead brother. Patient states that he was dreaming and he currently feels "excellent". Treated in the field by EMS with supplemental oxygen. He denies any chest pain no other associated symptoms. He is not short of breath while on room air. No other associated symptoms Past Medical History  Diagnosis Date  . Diabetes mellitus   . Atrophy of calf muscles   . Depression   . GERD (gastroesophageal reflux disease)   . Peripheral neuropathy   . Hypercholesteremia   . Hx of tracheostomy   . Chronic systolic CHF (congestive heart failure), NYHA class 3    Past Surgical History  Procedure Laterality Date  . Pancreas surgery  11/14/1999    hx pancreatitis  . Anterior cervical decomp/discectomy fusion  04/13/2012    Procedure: ANTERIOR CERVICAL DECOMPRESSION/DISCECTOMY FUSION 1 LEVEL;  Surgeon: Temple Pacini, MD;  Location: [facility]S;  Service: Neurosurgery;  Laterality: N/A;  Cervcial Five-Six Anterior Cervical Decompression and Fusion with Allograft and Plating  . Cardiac catheterization N/A 11/15/2014    Procedure: Right Heart Cath;  Surgeon: Dolores Patty, MD;  Location: [facility] INVASIVE CV LAB;  Service: Cardiovascular;  Laterality: N/A;  . Peripheral vascular catheterization N/A 02/26/2015    Procedure: Abdominal Aortogram;  Surgeon: Chuck Hint, MD;  Location: [facility] INVASIVE CV LAB;  Service: Cardiovascular;  Laterality: N/A;   Family History  Problem Relation Age of Onset  . Diabetes Mellitus II Sister   . CAD Brother   . Cancer Mother     ? type   . Cancer Father     ?  type   Social History  Substance Use Topics  . Smoking status: Current Every Day Smoker -- 1.00 packs/day for 30 years    Types: Cigarettes  . Smokeless tobacco: Never Used  . Alcohol Use: No     Comment: Quit after his brother's death in 11/13/05 or 8     Review of Systems  Constitutional: Negative.   HENT: Negative.   Respiratory: Positive for shortness of breath.   Cardiovascular: Positive for leg swelling.       Chronic leg edema  Gastrointestinal: Negative.   Musculoskeletal: Negative.   Skin: Negative.   Neurological: Negative.   Psychiatric/Behavioral: Negative.   All other systems reviewed and are negative.     Allergies  Review of patient's allergies indicates no known allergies.  Home Medications   Prior to Admission medications   Medication Sig Start Date End Date Taking? Authorizing Provider  acetaminophen (TYLENOL) 500 MG tablet Take 2 tablets (1,000 mg total) by mouth every 6 (six) hours as needed for mild pain or headache. 11/21/14   Joline Salt Barrett, PA-C  atorvastatin (LIPITOR) 40 MG tablet Take 1 tablet (40 mg total) by mouth daily at 6 PM. 03/01/15   Amy D Clegg, NP  bisacodyl (DULCOLAX) 5 MG EC tablet Take 5 mg by mouth daily as needed for mild constipation or moderate constipation.    Historical Provider, MD  buPROPion (WELLBUTRIN SR) 150 MG 12 hr tablet Take 150 mg by mouth 2 (two) times daily.  01/29/15   Historical Provider, MD  clopidogrel (PLAVIX) 75 MG tablet Take 1 tablet (75 mg total) by mouth daily. 03/01/15   Amy Georgie Chard, NP  [employer]S VITAMIN B12 1000 MCG tablet Take 1,000 mcg by mouth daily. 10/15/14   Historical Provider, MD  DULoxetine (CYMBALTA) 30 MG capsule Take 1 capsule (30 mg total) by mouth daily. 02/04/15   Drema Dallas, MD  esomeprazole (NEXIUM) 40 MG capsule Take 40 mg by mouth daily before breakfast.    Historical Provider, MD  ferrous sulfate 325 (65 FE) MG EC tablet Take 1 tablet (325 mg total) by mouth daily with breakfast. 03/08/15   Amy D  Clegg, NP  hydrALAZINE (APRESOLINE) 25 MG tablet Take 1 tablet (25 mg total) by mouth every 8 (eight) hours. 03/01/15   Amy D Filbert Schilder, NP  insulin aspart (NOVOLOG) 100 UNIT/ML injection Inject 0-15 Units into the skin 3 (three) times daily with meals. CBG 70 - 120: 0 units CBG 121 - 150: 2 units CBG 151 - 200: 3 units CBG 201 - 250: 5 units CBG 251 - 300: 8 units CBG 301 - 350: 11 units CBG 351 - 400: 15 units Patient taking differently: Inject 0-15 Units into the skin 3 (three) times daily as needed for high blood sugar. CBG 70 - 120: 0 units CBG 121 - 150: 2 units CBG 151 - 200: 3 units CBG 201 - 250: 5 units CBG 251 - 300: 8 units CBG 301 - 350: 11 units CBG 351 - 400: 15 units 11/13/13   Maryann Mikhail, DO  insulin glargine (LANTUS) 100 UNIT/ML injection Inject 0.2 mLs (20 Units total) into the skin 2 (two) times daily. 03/13/15   Amy D Filbert Schilder, NP  isosorbide mononitrate (IMDUR) 30 MG 24 hr tablet Take 1 tablet (30 mg total) by mouth daily. 02/04/15   Drema Dallas, MD  lipase/protease/amylase (CREON-10/PANCREASE) 12000 UNITS CPEP Take 1-3 capsules by mouth 2 (two) times daily. 3 capsules before meal, and 1 capsule before snack    Historical Provider, MD  magnesium oxide (MAG-OX) 400 (241.3 MG) MG tablet Take 400 mg by mouth 2 (two) times daily. 01/26/15   Historical Provider, MD  milrinone (PRIMACOR) 20 MG/100ML SOLN infusion Inject 31.8375 mcg/min into the vein continuous. Per [facility]. 12 months 03/01/15   Sherald Hess, NP  Olopatadine HCl (PATADAY) 0.2 % SOLN Apply 1 drop to eye daily as needed. For allergies    Historical Provider, MD  OXYCODONE HCL PO Take 1 tablet by mouth daily as needed (pain).    Historical Provider, MD  pregabalin (LYRICA) 150 MG capsule Take 150 mg by mouth 2 (two) times daily.    Historical Provider, MD  tiZANidine (ZANAFLEX) 4 MG tablet Take 4 mg by mouth 2 (two) times daily as needed for muscle spasms.    Historical Provider, MD  torsemide (DEMADEX) 20 MG tablet Take 1  tablet (20 mg total) by mouth 2 (two) times daily. Start the 20 mg twice a day. Start 9/6 at 6 pm 03/13/15   Amy D Filbert Schilder, NP  traMADol (ULTRAM) 50 MG tablet Take 50 mg by mouth every 6 (six) hours as needed for moderate pain.     Historical Provider, MD  VENTOLIN HFA 108 (90 BASE) MCG/ACT inhaler Inhale 1-2 puffs into the lungs every 6 (six) hours as needed for wheezing or shortness of breath.  02/15/15   Historical Provider, MD   BP 95/75 mmHg  Pulse 88  Temp(Src) 98.1 F (  36.7 C) (Oral)  Resp 16  SpO2 100% Physical Exam  Constitutional: He appears well-developed and well-nourished.  HENT:  Head: Normocephalic and atraumatic.  Eyes: Conjunctivae are normal. Pupils are equal, round, and reactive to light.  Neck: Neck supple. No tracheal deviation present. No thyromegaly present.  Cardiovascular: Normal rate and regular rhythm.   No murmur heard. Pulmonary/Chest: Effort normal and breath sounds normal.  Abdominal: Soft. Bowel sounds are normal. He exhibits no distension. There is no tenderness.  Musculoskeletal: Normal range of motion. He exhibits no edema or tenderness.  Neurological: He is alert. Coordination normal.  Skin: Skin is warm and dry. No rash noted.  Psychiatric: He has a normal mood and affect.  Nursing note and vitals reviewed.   ED Course  Procedures (including critical care time) Labs Review Labs Reviewed - No data to display  Imaging Review No results found. I have personally reviewed and evaluated these images and lab results as part of my medical decision-making.   EKG Interpretation   Date/Time:  Wednesday March 21 2015 11:51:33 EDT Ventricular Rate:  90 PR Interval:  200 QRS Duration: 135 QT Interval:  376 QTC Calculation: 460 R Axis:   -95 Text Interpretation:  Sinus rhythm Consider left atrial enlargement IVCD,  consider atypical LBBB No significant change since last tracing Confirmed  by Dajon Rowe  MD, Kayler Buckholtz [phone]) on 03/21/2015 12:06:11 PM      3 PM patient noted to be sleepy, arousable to verbal stimulus.  Results for orders placed or performed during the hospital encounter of 03/21/15  CBC with Differential/Platelet  Result Value Ref Range   WBC 9.8 4.0 - 10.5 K/uL   RBC 4.39 4.22 - 5.81 MIL/uL   Hemoglobin 9.6 (L) 13.0 - 17.0 g/dL   HCT 10.6 (L) 26.9 - 48.5 %   MCV 68.1 (L) 78.0 - 100.0 fL   MCH 21.9 (L) 26.0 - 34.0 pg   MCHC 32.1 30.0 - 36.0 g/dL   RDW 46.2 (H) 70.3 - 50.0 %   Platelets 312 150 - 400 K/uL   Neutrophils Relative % 76 %   Lymphocytes Relative 11 %   Monocytes Relative 12 %   Eosinophils Relative 1 %   Basophils Relative 0 %   Neutro Abs 7.4 1.7 - 7.7 K/uL   Lymphs Abs 1.1 0.7 - 4.0 K/uL   Monocytes Absolute 1.2 (H) 0.1 - 1.0 K/uL   Eosinophils Absolute 0.1 0.0 - 0.7 K/uL   Basophils Absolute 0.0 0.0 - 0.1 K/uL   RBC Morphology POLYCHROMASIA PRESENT   Brain natriuretic peptide  Result Value Ref Range   B Natriuretic Peptide 824.5 (H) 0.0 - 100.0 pg/mL  Ammonia  Result Value Ref Range   Ammonia 31 9 - 35 umol/L  Carboxyhemoglobin  Result Value Ref Range   Total hemoglobin 9.9 (L) 13.5 - 18.0 g/dL   O2 Saturation 93.8 %   Carboxyhemoglobin 3.4 (H) 0.5 - 1.5 %   Methemoglobin 0.4 0.0 - 1.5 %  I-stat chem 8, ed  Result Value Ref Range   Sodium 131 (L) 135 - 145 mmol/L   Potassium 3.6 3.5 - 5.1 mmol/L   Chloride 86 (L) 101 - 111 mmol/L   BUN 54 (H) 6 - 20 mg/dL   Creatinine, Ser 1.82 (H) 0.61 - 1.24 mg/dL   Glucose, Bld 993 (H) 65 - 99 mg/dL   Calcium, Ion 7.16 (L) 1.12 - 1.23 mmol/L   TCO2 32 0 - 100 mmol/L   Hemoglobin 12.6 (L)  13.0 - 17.0 g/dL   HCT 16.1 (L) 09.6 - 04.5 %   Dg Chest 2 View  03/21/2015   CLINICAL DATA:  Shortness of breath.  EXAM: CHEST  2 VIEW  COMPARISON:  03/11/2015  FINDINGS: Right PICC line is in place with the tip in the SVC, stable. Cardiomegaly with bilateral airspace opacities, increased since prior study. No effusions. No acute bony abnormality.  IMPRESSION:  Worsening diffuse bilateral airspace disease, which could represent edema or infection. Favor edema.   Electronically Signed   By: Charlett Nose M.D.   On: 03/21/2015 15:24   Dg Chest 2 View  03/11/2015   CLINICAL DATA:  Acute shortness of breath.  History of CHF.  EXAM: CHEST  2 VIEW  COMPARISON:  02/22/2015 and prior exams  FINDINGS: Cardiomegaly and mild interstitial pulmonary edema noted, increased from the prior study.  A right PICC line is noted with tip overlying the mid mid-upper SVC.  There is no evidence of pneumothorax or pleural effusion.  No acute bony abnormalities are noted.  IMPRESSION: Cardiomegaly with mild-moderate interstitial pulmonary edema.   Electronically Signed   By: Harmon Pier M.D.   On: 03/11/2015 08:48   Dg Chest Port 1 View  02/22/2015   CLINICAL DATA:  Nonproductive cough, dyspnea, hypoglycemia, renal failure.  EXAM: PORTABLE CHEST - 1 VIEW  COMPARISON:  Portable chest x-ray of January 30, 2015  FINDINGS: The lungs are adequately inflated. There is subsegmental atelectasis or infiltrate just above the right hemidiaphragm. The left lung is clear The cardiac silhouette remains enlarged. There is mild central pulmonary vascular prominence. The PICC line tip projects over the midportion of the SVC. There is degenerative change of the core though clavicular articulation.  IMPRESSION: Infiltrate or atelectasis at the right lung base slightly more conspicuous today. Low-grade compensated CHF.   Electronically Signed   By: David  [place] M.D.   On: 02/22/2015 08:37   Chest xray viewed by me MDM  I spoke with cardiology service who will consult on case. Defer to hospitalist service. I spoke with hospitalist service who will arrange for admission to stepdown unit.anemia is chronic pt with acute kidney injury and chf Final diagnoses:  None  Dx #1 chf acute on chronic #2 acute renal insufficiency #3hyperglycemia #4 anemia     Doug Sou, MD 03/21/15 1715

## 2015-03-21 NOTE — ED Notes (Signed)
Per [facility], pt from home, woke up this morning feeling Bleh, pt hx of CHF and is on the milrinone drip. Per EMS, patients saturations were 86% on RA, 93% on 2L. CBG 260, No other coimplaints by patient, patient denies CP or SOB or pain.

## 2015-03-22 ENCOUNTER — Encounter (HOSPITAL_COMMUNITY): Payer: Medicare Other

## 2015-03-22 ENCOUNTER — Inpatient Hospital Stay (HOSPITAL_COMMUNITY): Payer: Medicare Other

## 2015-03-22 DIAGNOSIS — G629 Polyneuropathy, unspecified: Secondary | ICD-10-CM

## 2015-03-22 DIAGNOSIS — Z720 Tobacco use: Secondary | ICD-10-CM

## 2015-03-22 DIAGNOSIS — M25561 Pain in right knee: Secondary | ICD-10-CM | POA: Diagnosis present

## 2015-03-22 DIAGNOSIS — R011 Cardiac murmur, unspecified: Secondary | ICD-10-CM | POA: Diagnosis present

## 2015-03-22 DIAGNOSIS — N189 Chronic kidney disease, unspecified: Secondary | ICD-10-CM

## 2015-03-22 DIAGNOSIS — N179 Acute kidney failure, unspecified: Secondary | ICD-10-CM

## 2015-03-22 DIAGNOSIS — I5022 Chronic systolic (congestive) heart failure: Secondary | ICD-10-CM

## 2015-03-22 DIAGNOSIS — Z515 Encounter for palliative care: Secondary | ICD-10-CM

## 2015-03-22 DIAGNOSIS — I272 Pulmonary hypertension, unspecified: Secondary | ICD-10-CM | POA: Diagnosis present

## 2015-03-22 DIAGNOSIS — E1165 Type 2 diabetes mellitus with hyperglycemia: Secondary | ICD-10-CM

## 2015-03-22 LAB — BASIC METABOLIC PANEL
ANION GAP: 11 (ref 5–15)
BUN: 57 mg/dL — AB (ref 6–20)
CALCIUM: 9.2 mg/dL (ref 8.9–10.3)
CHLORIDE: 87 mmol/L — AB (ref 101–111)
CO2: 34 mmol/L — AB (ref 22–32)
CREATININE: 2.21 mg/dL — AB (ref 0.61–1.24)
GFR calc Af Amer: 37 mL/min — ABNORMAL LOW (ref >60)
GFR calc non Af Amer: 32 mL/min — ABNORMAL LOW (ref >60)
Glucose, Bld: 125 mg/dL — ABNORMAL HIGH (ref 65–99)
POTASSIUM: 3.6 mmol/L (ref 3.5–5.1)
Sodium: 132 mmol/L — ABNORMAL LOW (ref 135–145)

## 2015-03-22 LAB — GLUCOSE, CAPILLARY
GLUCOSE-CAPILLARY: 175 mg/dL — AB (ref 65–99)
GLUCOSE-CAPILLARY: 94 mg/dL (ref 65–99)
Glucose-Capillary: 114 mg/dL — ABNORMAL HIGH (ref 65–99)
Glucose-Capillary: 140 mg/dL — ABNORMAL HIGH (ref 65–99)
Glucose-Capillary: 192 mg/dL — ABNORMAL HIGH (ref 65–99)

## 2015-03-22 LAB — CBC
HCT: 28.2 % — ABNORMAL LOW (ref 39.0–52.0)
Hemoglobin: 9.1 g/dL — ABNORMAL LOW (ref 13.0–17.0)
MCH: 21.6 pg — AB (ref 26.0–34.0)
MCHC: 32.3 g/dL (ref 30.0–36.0)
MCV: 67 fL — AB (ref 78.0–100.0)
PLATELETS: 311 10*3/uL (ref 150–400)
RBC: 4.21 MIL/uL — ABNORMAL LOW (ref 4.22–5.81)
RDW: 19.7 % — AB (ref 11.5–15.5)
WBC: 8.7 10*3/uL (ref 4.0–10.5)

## 2015-03-22 MED ORDER — GLUCERNA SHAKE PO LIQD
237.0000 mL | Freq: Three times a day (TID) | ORAL | Status: DC
  Administered 2015-03-22 – 2015-03-29 (×13): 237 mL via ORAL
  Filled 2015-03-22 (×3): qty 237

## 2015-03-22 MED ORDER — TORSEMIDE 20 MG PO TABS
20.0000 mg | ORAL_TABLET | Freq: Two times a day (BID) | ORAL | Status: DC
  Administered 2015-03-22 – 2015-03-29 (×15): 20 mg via ORAL
  Filled 2015-03-22 (×18): qty 1

## 2015-03-22 MED ORDER — ADULT MULTIVITAMIN W/MINERALS CH
1.0000 | ORAL_TABLET | Freq: Every day | ORAL | Status: DC
  Administered 2015-03-22 – 2015-03-29 (×8): 1 via ORAL
  Filled 2015-03-22 (×8): qty 1

## 2015-03-22 NOTE — Consult Note (Signed)
Consultation Note Date: 03/22/2015   Patient Name: Christian Wall  DOB: May 03, 1961  MRN: [id number]  Age / Sex: 54 y.o., male   PCP: Nolene Ebbs, MD Referring Physician: Allie Bossier, MD  Reason for Consultation: Establishing goals of care  Palliative Care Assessment and Plan Summary of Established Goals of Care and Medical Treatment Preferences   Clinical Assessment/Narrative: Christian Wall is a 55 year old male with history of chronic systolic heart failure on milrinone, polysubstance abuse, diabetes, peripheral neuropathy and 5 hospitalizations in the last 6 months. He is admitted with altered mental status. Palliative was consulted for goals of care.  I met today with the patient and his wife. We had a long discussion about his course this point in time. His wife reports that she has been noticing that he has been having a decline in his functional status, cognition, and nutrition. We discussed that this may be an indicator that he has been having advancement of his chronic illness in addition to his acute problems.  She reports that he follows through with cardiology and they have told him they are not sure if his heart is going to get better.  His wife and I discussed that the hospital can be useful as long as he is getting well enough from care he receives at the hospital to enjoy his time at home, but there is going to come a time in the near future where, if his goal is to be at home, he may be better served to plan on being at home and bringing care to him at home rather repeated trips to the hospital.   We also discussed that in light of multiple chronic medical problems that have worsened with this acute problem, care should be focused on interventions that are likely to allow the patient to achieve goal of getting back to home and spending time with family. I discussed with family regarding heroic interventions at the end-of-life and that this is not likely to lead to him  getting well enough to go back home.   She reports understanding my concerns and states that we'll need to have this discussion (hopefully tomorrow) when her husband is more mentally able to participate. I mentioned to her that a MOST form may be useful tool for [place] to go through and discuss his care.  - The patient remained confused today, but his wife reports that his mental status seems to be improving. She asked that I follow-up tomorrow to continue conversations when hopefully he is able to participate.  Contacts/Participants in Discussion: Primary Decision Maker: The patient and his wife   HCPOA:  none on chart   Code Status/Advance Care Planning:  Full code  Symptom Management:   Patient is confused this appears to be improving.   Additional Recommendations (Limitations, Scope, Preferences):   I discussed care planning moving forward. I introduced concept of completion of a most form prior to his discharge. His wife reports they be agreeable to looking at this. We'll plan to follow-up tomorrow to continue discussion Psycho-social/Spiritual:   Support System: Family  Desire for further Chaplaincy support:no  Prognosis: Unable to determine but with his milrinone dependence he would likely qualify for hospice support with a life expectancy of less than 6 months  Discharge Planning:  Home with [place]       Chief Complaint/History of Present Illness:  54 year old male with advanced heart failure (milrinone dependent) admitted with altered mental status    Primary Diagnoses  Present on  Admission:  . Acute encephalopathy . HLD (hyperlipidemia) . Essential hypertension . CKD (chronic kidney disease) stage 3, GFR 30-59 ml/min . Acute on chronic systolic CHF (congestive heart failure)  Palliative Review of Systems: Patient very sleepy joint encounter denies complaints. I have reviewed the medical record, interviewed the patient and family, and examined the patient.  The following aspects are pertinent.  Past Medical History  Diagnosis Date  . Diabetes mellitus   . Atrophy of calf muscles   . Depression   . GERD (gastroesophageal reflux disease)   . Peripheral neuropathy   . Hypercholesteremia   . Hx of tracheostomy   . Chronic systolic CHF (congestive heart failure), NYHA class 3    Social History   Social History  . Marital Status: Married    Spouse Name: N/A  . Number of Children: N/A  . Years of Education: N/A   Occupational History  . disabled    Social History Main Topics  . Smoking status: Current Every Day Smoker -- 1.00 packs/day for 30 years    Types: Cigarettes  . Smokeless tobacco: Never Used  . Alcohol Use: No     Comment: Quit after his brother's death in 09/13/05 or 8   . Drug Use: No  . Sexual Activity: Yes   Other Topics Concern  . None   Social History Narrative   Lives in [place] with wife, uses crutches or wheelchair to get around   Family History  Problem Relation Age of Onset  . Diabetes Mellitus II Sister   . CAD Brother   . Cancer Mother     ? type   . Cancer Father     ? type   Scheduled Meds: . atorvastatin  40 mg Oral q1800  . buPROPion  150 mg Oral BID  . clopidogrel  75 mg Oral Daily  . DULoxetine  30 mg Oral Daily  . feeding supplement (GLUCERNA SHAKE)  237 mL Oral TID BM  . heparin  5,000 Units Subcutaneous 3 times per day  . hydrALAZINE  10 mg Oral 3 times per day  . insulin aspart  0-9 Units Subcutaneous TID WC  . insulin glargine  16 Units Subcutaneous BID  . isosorbide mononitrate  15 mg Oral Daily  . lipase/protease/amylase  3 capsule Oral TID AC  . magnesium oxide  400 mg Oral BID  . multivitamin with minerals  1 tablet Oral Daily  . olopatadine  1 drop Both Eyes BID  . pantoprazole  40 mg Oral Daily  . sodium chloride  3 mL Intravenous Q12H  . sodium chloride  3 mL Intravenous Q12H  . torsemide  20 mg Oral BID  . cyanocobalamin  1,000 mcg Oral Daily   Continuous  Infusions: . milrinone 0.375 mcg/kg/min (03/22/15 1926)   PRN Meds:.sodium chloride, acetaminophen **OR** acetaminophen, albuterol, MUSCLE RUB, ondansetron **OR** ondansetron (ZOFRAN) IV, senna-docusate, sodium chloride Medications Prior to Admission:  Prior to Admission medications   Medication Sig Start Date End Date Taking? Authorizing Provider  acetaminophen (TYLENOL) 500 MG tablet Take 2 tablets (1,000 mg total) by mouth every 6 (six) hours as needed for mild pain or headache. 11/21/14  Yes Rhonda G Barrett, PA-C  atorvastatin (LIPITOR) 40 MG tablet Take 1 tablet (40 mg total) by mouth daily at 6 PM. 03/01/15  Yes Amy D Clegg, NP  bisacodyl (DULCOLAX) 5 MG EC tablet Take 5 mg by mouth daily as needed for mild constipation or moderate constipation.   Yes Historical Provider, MD  buPROPion (WELLBUTRIN SR) 150 MG 12 hr tablet Take 150 mg by mouth 2 (two) times daily. 01/29/15  Yes Historical Provider, MD  clopidogrel (PLAVIX) 75 MG tablet Take 1 tablet (75 mg total) by mouth daily. 03/01/15  Yes Amy D Clegg, NP  [employer] VITAMIN B12 1000 MCG tablet Take 1,000 mcg by mouth daily. 10/15/14  Yes Historical Provider, MD  DULoxetine (CYMBALTA) 30 MG capsule Take 1 capsule (30 mg total) by mouth daily. 02/04/15  Yes Allie Bossier, MD  esomeprazole (NEXIUM) 40 MG capsule Take 40 mg by mouth daily before breakfast.   Yes Historical Provider, MD  ferrous sulfate 325 (65 FE) MG EC tablet Take 1 tablet (325 mg total) by mouth daily with breakfast. 03/08/15  Yes Amy D Clegg, NP  hydrALAZINE (APRESOLINE) 25 MG tablet Take 1 tablet (25 mg total) by mouth every 8 (eight) hours. 03/01/15  Yes Amy D Clegg, NP  insulin aspart (NOVOLOG) 100 UNIT/ML injection Inject 0-15 Units into the skin 3 (three) times daily with meals. CBG 70 - 120: 0 units CBG 121 - 150: 2 units CBG 151 - 200: 3 units CBG 201 - 250: 5 units CBG 251 - 300: 8 units CBG 301 - 350: 11 units CBG 351 - 400: 15 units Patient taking differently: Inject 0-15  Units into the skin 3 (three) times daily as needed for high blood sugar. CBG 70 - 120: 0 units CBG 121 - 150: 2 units CBG 151 - 200: 3 units CBG 201 - 250: 5 units CBG 251 - 300: 8 units CBG 301 - 350: 11 units CBG 351 - 400: 15 units 11/13/13  Yes Maryann Mikhail, DO  insulin glargine (LANTUS) 100 UNIT/ML injection Inject 0.2 mLs (20 Units total) into the skin 2 (two) times daily. 03/13/15  Yes Amy D Clegg, NP  isosorbide mononitrate (IMDUR) 30 MG 24 hr tablet Take 1 tablet (30 mg total) by mouth daily. 02/04/15  Yes Allie Bossier, MD  lipase/protease/amylase (CREON-10/PANCREASE) 12000 UNITS CPEP Take 1-3 capsules by mouth 2 (two) times daily. 3 capsules before meal, and 1 capsule before snack   Yes Historical Provider, MD  magnesium oxide (MAG-OX) 400 (241.3 MG) MG tablet Take 400 mg by mouth 2 (two) times daily. 01/26/15  Yes Historical Provider, MD  milrinone (PRIMACOR) 20 MG/100ML SOLN infusion Inject 31.8375 mcg/min into the vein continuous. Per [facility]. 12 months 03/01/15  Yes Amy D Clegg, NP  Olopatadine HCl (PATADAY) 0.2 % SOLN Apply 1 drop to eye daily as needed. For allergies   Yes Historical Provider, MD  oxyCODONE (OXY IR/ROXICODONE) 5 MG immediate release tablet Take 5 mg by mouth at bedtime as needed for severe pain.   Yes Historical Provider, MD  pregabalin (LYRICA) 150 MG capsule Take 150 mg by mouth 2 (two) times daily.   Yes Historical Provider, MD  tiZANidine (ZANAFLEX) 4 MG tablet Take 4 mg by mouth 2 (two) times daily as needed for muscle spasms.   Yes Historical Provider, MD  torsemide (DEMADEX) 20 MG tablet Take 1 tablet (20 mg total) by mouth 2 (two) times daily. Start the 20 mg twice a day. Start 9/6 at 6 pm 03/13/15  Yes Amy D Clegg, NP  VENTOLIN HFA 108 (90 BASE) MCG/ACT inhaler Inhale 1-2 puffs into the lungs every 6 (six) hours as needed for wheezing or shortness of breath.  02/15/15  Yes Historical Provider, MD   No Known Allergies CBC:    Component Value Date/Time   WBC  8.7 03/22/2015 0530   HGB 9.1* 03/22/2015 0530   HCT 28.2* 03/22/2015 0530   PLT 311 03/22/2015 0530   MCV 67.0* 03/22/2015 0530   NEUTROABS 7.4 03/21/2015 1200   LYMPHSABS 1.1 03/21/2015 1200   MONOABS 1.2* 03/21/2015 1200   EOSABS 0.1 03/21/2015 1200   BASOSABS 0.0 03/21/2015 1200   Comprehensive Metabolic Panel:    Component Value Date/Time   NA 132* 03/22/2015 0530   K 3.6 03/22/2015 0530   CL 87* 03/22/2015 0530   CO2 34* 03/22/2015 0530   BUN 57* 03/22/2015 0530   CREATININE 2.21* 03/22/2015 0530   GLUCOSE 125* 03/22/2015 0530   CALCIUM 9.2 03/22/2015 0530   CALCIUM 12.5* 04/17/2008 0710   AST 37 03/21/2015 1906   ALT 18 03/21/2015 1906   ALKPHOS 352* 03/21/2015 1906   BILITOT 2.6* 03/21/2015 1906   PROT 7.6 03/21/2015 1906   ALBUMIN 2.6* 03/21/2015 1906    Physical Exam: Vital Signs: BP 116/78 mmHg  Pulse 98  Temp(Src) 98.5 F (36.9 C) (Oral)  Resp 19  Ht 5' 8"  (1.727 m)  Wt 85.6 kg (188 lb 11.4 oz)  BMI 28.70 kg/m2  SpO2 92% SpO2: SpO2: 92 % O2 Device: O2 Device: Nasal Cannula O2 Flow Rate: O2 Flow Rate (L/min): 2 L/min Intake/output summary:  Intake/Output Summary (Last 24 hours) at 03/22/15 1928 Last data filed at 03/22/15 1805  Gross per 24 hour  Intake    354 ml  Output    700 ml  Net   -346 ml   LBM: Last BM Date: 03/21/15 Baseline Weight: Weight: 85.9 kg (189 lb 6 oz) Most recent weight: Weight: 85.6 kg (188 lb 11.4 oz)  Exam Findings:  General: Chronically ill appearing. No resp difficulty commands HEENT: normal Neck: supple. JVP to ear . CV: Regular rate & rhythm. No rubs, gallops or murmurs. Lungs: clear Abdomen: soft, nontender, nondistended. No bruits or masses. Good bowel sounds. Extremities: no cyanosis, clubbing, rash, R and LLE trace edema. Extremities warm. RUE PICC  Neuro: Drowsy. Alert x to self moves all 4 extremities w/o difficulty. Weak lower extremities. Affect pleasant. + asterixis          Palliative Performance  Scale: 40                Additional Data Reviewed: Recent Labs     03/21/15  1200  03/21/15  1207  03/22/15  0530  WBC  9.8   --   8.7  HGB  9.6*  12.6*  9.1*  PLT  312   --   311  NA   --   131*  132*  BUN   --   54*  57*  CREATININE   --   2.20*  2.21*     Time In: 130 Time Out: 240 Time Total: 70 Greater than 50%  of this time was spent counseling and coordinating care related to the above assessment and plan.  Signed by: Micheline Rough, MD  Micheline Rough, MD  03/22/2015, 7:28 PM  Please contact Palliative Medicine Team phone at [phone] for questions and concerns.

## 2015-03-22 NOTE — Progress Notes (Signed)
[facility] TEAM 1 - Stepdown/ICU TEAM Progress Note  Christian Wall ONG:[id number] DOB: 09/15/1960 DOA: 03/21/2015 PCP: Dorrene German, MD  Admit HPI / Brief Narrative: Christian Wall is a 54 y.o. BM, PMHx with systolic heart failure on a milrinone drip, diabetes, atrophy of his calf muscles (he is unable to walk), peripheral neuropathy. He is being admitted for acute encephalopathy and acute on chronic systolic heart failure. He was just discharged from cardiology's service on 03/13/2015 after being treated for acute on chronic systolic heart failure. His wife said he was doing fairly well until Sunday when he began to become confused. His wife is extremely concerned. She states he is talking out of his head. She is unable to care for him at home in this state. His oxygen saturation was 86% on room air. His wife reports he is not on any new medications he did take 1 hydrocodone yesterday for knee pain. She reports that he does not drink or use any recreational drugs. He has never had any issues with liver disease. She reports that he is urinating less and it appears dark. His appetite has also dropped off. He had one episode of vomiting just after discharge last week. Otherwise he has not had any fever, cough, chest pain, abdominal pain, dysuria, diarrhea, myalgias.  In the emergency department his WBC is within normal limits, BNP is 824, creatinine is 2.2 (elevated from previous but still within his baseline), BUN is slightly elevated. Chest x-ray shows bilateral airspace disease-pulmonary edema versus infection.  HPI/Subjective: 9/15 A/O 3 (does not know when), patient remains confused on certain health care issues confusing his last admission with this admission.  Assessment/Plan: Acute encephalopathy undetermined cause -UDS negative -Improving A/O 3 (does not know when) -Per his wife he has never had liver disease. He is not having headaches or changes in vision.  -Head CT; nondiagnostic  see results below  -Chest x-ray today showed pulmonary edema versus infection. Repeat CXR non-diagnostic for cause -Possibly iatrogenic; patient on OxyIR, Lyrica, Zanaflex, and tramadol at home. In a patient with chronic renal failure could have built to a toxic level. -Hold all sedating medications i.e. narcotics, benzodiazepine's, muscle relaxants.  Chronic Systolic CHF (NICM), NYHA III  - On Milrinone, per heart failure team. -CHF team will determine if patient needs to return to home Milrinone prior to discharge. -Strict in and out since admission; - -Daily weight; admission weight= 85.9 kg 9/15 bed weight= 85.6 kg  -Continue hydralazine 75 mg TID  Essential HTN -See chronic systolic CHF  Pulmonary hypertension -See systolic CHF  New systolic murmur -Murmur was not there last month.  -Will not repeat echocardiogram unless cardiology feels warranted  DM Type IIuncontrolled -Over the last year patient's hemoglobin A1c has gone from 15.2--> 7.4 showing significant improvement. - CBGs fairly well controlled, continue Lantus 16 units BID - continue with sensitive SSI  HLD -Lipid panel within ADA guidelines  -Continue Lipitor 40 mg daily  Acute on chronic kidney failure stage III (baseline Cr 1.29-3.22) -Cr now within patient's normal range  Peripheral neuropathy -Currently not complaining of symptoms, hold adding any medication -See depression  Depression -Continue Cymbalta 30 mg daily  Right knee pain (most likely gout flare) -Hold narcotic pain meds. -[employer].  -9/14 Uric acid= 14.8  Gout -Currently not complaining of pain but would use steroid's initially with low-dose colchicine secondary to patient's poor renal function  Wounds  -Sacral and lower extremity -Wound Care RN assistance requested.  Tobacco Abuse  -Patient states stopped smoking ~2 months ago;encouraged to continue to not smoke. Smoking cessation      Code Status:  FULL Family Communication: Wife present at time of exam Disposition Plan: Resolution encephalopathy    Consultants: Dr.Daniel R Bensimhon (heart failure team)   Procedure/Significant Events: 9/14 CT head without contrast;-Old RIGHT frontal craniotomy, RIGHT frontal encephalomalacia.-Negative acute infarct 9/15 CXR; stable cardiomegaly diffuse airspace disease   CultureAntibiotics: 9/14 blood left arm/hand NGTD 9/14 urine NGTD   DVT prophylaxis: SCD   Devices    LINES / TUBES:      Continuous Infusions: . milrinone 0.375 mcg/kg/min (03/22/15 1926)    Objective: VITAL SIGNS: Temp: 98 F (36.7 C) (09/15 2000) Temp Source: Oral (09/15 2000) BP: 114/89 mmHg (09/15 2000) Pulse Rate: 102 (09/15 2000) SPO2; FIO2:   Intake/Output Summary (Last 24 hours) at 03/22/15 2106 Last data filed at 03/22/15 1805  Gross per 24 hour  Intake    354 ml  Output    700 ml  Net   -346 ml     Exam: General: A/O 3 (does not know when), laying Comfortably in bed, NAD, No acute respiratory distress Eyes: Negative headache, eye pain, double vision, negative scleral hemorrhage ENT: Negative Runny nose, negative ear pain, negative tinnitus, negative gingival bleeding Neck: Negative scars, masses, torticollis, lymphadenopathy, JVD Lungs: Clear to auscultation bilaterally without wheezes or crackles Cardiovascular: Regular rate and rhythm positive systolic murmur gallop or rub normal S1 and S2, positive S3 Abdomen:negative abdominal pain, negative dysphagia, Nontender, nondistended, soft, bowel sounds positive, no rebound, no ascites, no appreciable mass Extremities: No significant cyanosis, clubbing, or edema bilateral lower extremities Psychiatric: Negative depression, negative anxiety, negative fatigue, negative mania  Neurologic: Cranial nerves II through XII intact, tongue/uvula midline, all extremities muscle strength 5/5, sensation intact throughout, negative dysarthria,  negative expressive aphasia, negative receptive aphasia.   Data Reviewed: Basic Metabolic Panel:  Recent Labs Lab 03/21/15 1207 03/22/15 0530  NA 131* 132*  K 3.6 3.6  CL 86* 87*  CO2  --  34*  GLUCOSE 196* 125*  BUN 54* 57*  CREATININE 2.20* 2.21*  CALCIUM  --  9.2   Liver Function Tests:  Recent Labs Lab 03/21/15 1906  AST 37  ALT 18  ALKPHOS 352*  BILITOT 2.6*  PROT 7.6  ALBUMIN 2.6*   No results for input(s): LIPASE, AMYLASE in the last 168 hours.  Recent Labs Lab 03/21/15 1620  AMMONIA 31   CBC:  Recent Labs Lab 03/21/15 1200 03/21/15 1207 03/22/15 0530  WBC 9.8  --  8.7  NEUTROABS 7.4  --   --   HGB 9.6* 12.6* 9.1*  HCT 29.9* 37.0* 28.2*  MCV 68.1*  --  67.0*  PLT 312  --  311   Cardiac Enzymes: No results for input(s): CKTOTAL, CKMB, CKMBINDEX, TROPONINI in the last 168 hours. BNP (last 3 results)  Recent Labs  02/21/15 1415 03/11/15 1702 03/21/15 1200  BNP 1554.1* 1184.5* 824.5*    ProBNP (last 3 results) No results for input(s): PROBNP in the last 8760 hours.  CBG:  Recent Labs Lab 03/22/15 0030 03/22/15 0747 03/22/15 1225 03/22/15 1555  GLUCAP 94 114* 140* 175*    Recent Results (from the past 240 hour(s))  Culture, blood (routine x 2)     Status: None (Preliminary result)   Collection Time: 03/21/15  5:44 PM  Result Value Ref Range Status   Specimen Description BLOOD LEFT ARM  Final   Special Requests  BOTTLES DRAWN AEROBIC AND ANAEROBIC 10CC  Final   Culture NO GROWTH < 24 HOURS  Final   Report Status PENDING  Incomplete  Urine culture     Status: None (Preliminary result)   Collection Time: 03/21/15  6:33 PM  Result Value Ref Range Status   Specimen Description URINE, CLEAN CATCH  Final   Special Requests NONE  Final   Culture NO GROWTH < 12 HOURS  Final   Report Status PENDING  Incomplete  Culture, blood (routine x 2)     Status: None (Preliminary result)   Collection Time: 03/21/15  7:40 PM  Result Value Ref  Range Status   Specimen Description BLOOD LEFT HAND  Final   Special Requests IN PEDIATRIC BOTTLE 2CC  Final   Culture NO GROWTH < 24 HOURS  Final   Report Status PENDING  Incomplete     Studies:  Recent x-ray studies have been reviewed in detail by the Attending Physician  Scheduled Meds:  Scheduled Meds: . atorvastatin  40 mg Oral q1800  . buPROPion  150 mg Oral BID  . clopidogrel  75 mg Oral Daily  . DULoxetine  30 mg Oral Daily  . feeding supplement (GLUCERNA SHAKE)  237 mL Oral TID BM  . heparin  5,000 Units Subcutaneous 3 times per day  . hydrALAZINE  10 mg Oral 3 times per day  . insulin aspart  0-9 Units Subcutaneous TID WC  . insulin glargine  16 Units Subcutaneous BID  . isosorbide mononitrate  15 mg Oral Daily  . lipase/protease/amylase  3 capsule Oral TID AC  . magnesium oxide  400 mg Oral BID  . multivitamin with minerals  1 tablet Oral Daily  . olopatadine  1 drop Both Eyes BID  . pantoprazole  40 mg Oral Daily  . sodium chloride  3 mL Intravenous Q12H  . sodium chloride  3 mL Intravenous Q12H  . torsemide  20 mg Oral BID  . cyanocobalamin  1,000 mcg Oral Daily    Time spent on care of this patient: 40 mins   WOODS, Roselind Messier , MD  [facility] Office  [phone] Pager - [phone]  On-Call/Text Page:      [email or web address]      password [email or web address]  If 7PM-7AM, please contact night-coverage [email or web address] Password [email or web address] 03/22/2015, 9:06 PM   LOS: 1 day   Care during the described time interval was provided by me .  I have reviewed this patient's available data, including medical history, events of note, physical examination, and all test results as part of my evaluation. I have personally reviewed and interpreted all radiology studies.   Carolyne Littles, MD [phone] Pager

## 2015-03-22 NOTE — Consult Note (Signed)
WOC wound consult note Reason for Consult: Consult requested for sacrum and right foot.  Family member at bedside states he previously had a stage 4 pressure injury to sacrum which healed, and then scar tissue reopened recently for a new wound.  Right foot previously had eschar and patient had re-vascularization during recent hospital stay and wounds have continued to improve. Wound type: Sacrum with chronic stage 4 pressure injury, .5X.5X.1cm, pink and dry with bone palpable, located directly over sacral bone in a deep valley.  No odor or drainage, surrounded by pink dry scar tissue.  Pressure Ulcer POA: Yes Measurement: Right great toe with eschar which is loose and removes easily, revealing healing full thickness wound; 1.2X1X.1cm, 100% pink and dry.  All other areas of loose eschar to right toes remove easily, revealing pink dry skin without wounds or drainage. Dressing procedure/placement/frequency: Foam dressing to sacrum and right great toe to promote healing.  Discussed plan of care with family at bedside. Please re-consult if further assistance is needed.  Thank-you,  Cammie Mcgee MSN, RN, CWOCN, [place], CNS [phone]

## 2015-03-22 NOTE — Progress Notes (Signed)
Initial Nutrition Assessment  DOCUMENTATION CODES:   Not applicable  INTERVENTION:    Glucerna Shake po TID, each supplement provides 220 kcal and 10 grams of protein  MVI daily  NUTRITION DIAGNOSIS:   Increased nutrient needs related to wound healing as evidenced by estimated needs.  GOAL:   Patient will meet greater than or equal to 90% of their needs  MONITOR:   PO intake, Supplement acceptance, Labs, Weight trends, Skin, I & O's  REASON FOR ASSESSMENT:   Low Braden    ASSESSMENT:   54 year old gentleman with prior h/o chronic systolic heart failure on milrinone drip, polysubstance abuse, DM, bed bound, peripheral neuropathy, recently discharged from cardiology service on 9/6, presented today with worsening sob and confusion and a recent fall. On arrival to ED, he was found to be hypoxic and has increased work of breathing. His CXR revealed worsening pulmonary edema.  Patient sleeping during RD visit. Nutrition-Focused physical exam completed. Findings are no fat depletion, no muscle depletion, and moderate edema. Patient with pressure ulcers, needs adequate protein and calorie intake to promote healing.  Labs reviewed: sodium low, BUN & creatinine elevated.  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  Estimated Nutritional Needs:   Kcal:  2200-2400  Protein:  115-130 gm  Fluid:  2.2-2.4 L  EDUCATION NEEDS:   No education needs identified at this time  Joaquin Courts, RD, LDN, CNSC Pager [phone] After Hours Pager [phone]

## 2015-03-22 NOTE — Evaluation (Signed)
Physical Therapy Evaluation Patient Details Name: Christian Wall MRN: [id number] DOB: April 01, 1961 Today's Date: 03/22/2015   History of Present Illness  54 year old gentleman with prior h/o chronic systolic heart failure on milrinone drip, polysubstance abuse, DM, peripheral neuropathy, recently discharged from cardiology service on 9/6, presented today with worsening sob and confusion and a recent fall  Clinical Impression  Pt limited by lethargy, confusion, decreased attention and impaired memory. Pt reports he normally walks with crutches and NP, Amy Clegg present end of session to confirm this mobility. Unsure of home setup or PLOF with ADLs. Pt will benefit from acute therapy to maximize mobility, gait, function and strength to decrease burden of care.     Follow Up Recommendations Supervision/Assistance - 24 hour;SNF (if unable to complete mobility or clear cognitively prior to D/C recommend SNF. )    Equipment Recommendations  Other (comment) (TBD)    Recommendations for Other Services Speech consult;OT consult     Precautions / Restrictions Precautions Precautions: Fall Restrictions Weight Bearing Restrictions: No      Mobility  Bed Mobility Overal bed mobility: Needs Assistance Bed Mobility: Supine to Sit;Sit to Supine     Supine to sit: Min assist Sit to supine: Min guard   General bed mobility comments: pt able to transfer to EOB with rail, mod cues, max encouragement and min assist to elevate trunk and bring legs to EOB. With return to supine increased time and cues but able to complete and scoot to [facility] without assist.   Transfers                 General transfer comment: Pt currently not safe to attempt OOB. Unable to state how he mobilizes at home, attend to task or perform anterior weight shift in standing  Ambulation/Gait                Stairs            Wheelchair Mobility    Modified Rankin (Stroke Patients Only)       Balance  Overall balance assessment: Needs assistance   Sitting balance-Leahy Scale: Fair                                       Pertinent Vitals/Pain Pain Assessment: No/denies pain  86% on RA and 92% on 1.5L    Home Living Family/patient expects to be discharged to:: Private residence Living Arrangements: Spouse/significant other Available Help at Discharge: Family           Home Equipment: Crutches Additional Comments: pt unable to provide PLOF secondary to AMS. 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)         Communication      Cognition Arousal/Alertness: Lethargic Behavior During Therapy: Flat affect Overall Cognitive Status: Impaired/Different from baseline Area of Impairment: Orientation;Attention;Memory;Following commands;Safety/judgement;Problem solving Orientation Level: Disoriented to;Time;Situation Current Attention Level: Focused Memory: Decreased short-term memory Following Commands: Follows one step commands inconsistently;Follows one step commands with increased time Safety/Judgement: Decreased awareness of safety;Decreased awareness of  deficits   Problem Solving: Slow processing;Decreased initiation;Difficulty sequencing;Requires verbal cues;Requires tactile cues General Comments: Pt very lethargic with inability to recall PLOF or attend to tasks    General Comments      Exercises        Assessment/Plan    PT Assessment Patient needs continued PT services  PT Diagnosis Difficulty walking;Generalized weakness;Altered mental status   PT Problem List Decreased strength;Decreased activity tolerance;Decreased balance;Decreased mobility;Decreased safety awareness   PT Treatment Interventions Gait training;Functional mobility training;Therapeutic activities;Therapeutic exercise;Balance training;Patient/family education;Cognitive remediation   PT Goals (Current goals can be found in the Care Plan section) Acute Rehab PT Goals Patient Stated Goal: increase mobility PT Goal Formulation: Patient unable to participate in goal setting Time For Goal Achievement: 04/05/15 Potential to Achieve Goals: Fair    Frequency Min 3X/week   Barriers to discharge Decreased caregiver support      Co-evaluation               End of Session   Activity Tolerance: Patient limited by lethargy Patient left: in bed;with call bell/phone within reach;with bed alarm set Nurse Communication: Mobility status;Precautions         Time: 1610-9604 PT Time Calculation (min) (ACUTE ONLY): 18 min   Charges:   PT Evaluation $Initial PT Evaluation Tier I: 1 Procedure     PT G CodesDelorse Lek 03/22/2015, 10:58 AM Delaney Meigs, PT [phone]

## 2015-03-22 NOTE — Progress Notes (Signed)
Patient admitted into room 15 from ED via stretcher with [place] @ 2L, alert to self and knows he's in hospital but could not state which one, wife at bedside, Right upper DL PICC, one port infusing milrinone as prescribed home dosage via pump from home, 2nd lumen of PICC was clotted; however, IV nurse instilled cath flo activase, now lumen flushes easily with good blood return. Patient was taken off home dosage of milrinone and switched to hospital prepared bag. Patient's personal pump was left at bedside for wife to take home.

## 2015-03-22 NOTE — Progress Notes (Signed)
UR COMPLETED  

## 2015-03-22 NOTE — Progress Notes (Signed)
Advanced Heart Failure Rounding Note   Subjective:    Admitted with AMS and volume overload. Ammonia on admit 31. CT of head no acute process. Started 120 mg lasix twice daily. Weight down 1 pound.   Remains confused. Denies SOB or orthopnea   UA negative UDS negative  Objective:   Weight Range:  Vital Signs:   Temp:  [97.7 F (36.5 C)-99.4 F (37.4 C)] 99.4 F (37.4 C) (09/15 0727) Pulse Rate:  [44-96] 92 (09/15 0727) Resp:  [13-25] 23 (09/15 0727) BP: (95-148)/(66-110) 128/67 mmHg (09/15 0727) SpO2:  [93 %-100 %] 99 % (09/15 0727) Weight:  [188 lb 11.4 oz (85.6 kg)-189 lb 6 oz (85.9 kg)] 188 lb 11.4 oz (85.6 kg) (09/15 0500) Last BM Date: 03/21/15  Weight change: Filed Weights   03/21/15 1915 03/22/15 0500  Weight: 189 lb 6 oz (85.9 kg) 188 lb 11.4 oz (85.6 kg)    Intake/Output:   Intake/Output Summary (Last 24 hours) at 03/22/15 0816 Last data filed at 03/22/15 0736  Gross per 24 hour  Intake   65.6 ml  Output    150 ml  Net  -84.4 ml     Physical Exam: General: Chronically ill appearing. No resp difficulty  commands HEENT: normal Neck: supple. JVP to ear . Carotids 2+ bilat; no bruits. No lymphadenopathy or thryomegaly appreciated. Cor: PMI nondisplaced. Regular rate & rhythm. No rubs, gallops or murmurs. Lungs: clear Abdomen: soft, nontender, nondistended. No hepatosplenomegaly. No bruits or masses. Good bowel sounds. Extremities: no cyanosis, clubbing, rash, R and LLE trace edema. Extremities warm. RUE PICC  Neuro: Drowsy. Alert x to self. cranial nerves grossly intact. moves all 4 extremities w/o difficulty. Weak lower extremities. Affect pleasant. + asterixis  Telemetry: SR 90 BPM  Labs: Basic Metabolic Panel:  Recent Labs Lab 03/21/15 1207 03/22/15 0530  NA 131* 132*  K 3.6 3.6  CL 86* 87*  CO2  --  34*  GLUCOSE 196* 125*  BUN 54* 57*  CREATININE 2.20* 2.21*  CALCIUM  --  9.2    Liver Function Tests:  Recent Labs Lab  03/21/15 1906  AST 37  ALT 18  ALKPHOS 352*  BILITOT 2.6*  PROT 7.6  ALBUMIN 2.6*   No results for input(s): LIPASE, AMYLASE in the last 168 hours.  Recent Labs Lab 03/21/15 1620  AMMONIA 31    CBC:  Recent Labs Lab 03/21/15 1200 03/21/15 1207  WBC 9.8  --   NEUTROABS 7.4  --   HGB 9.6* 12.6*  HCT 29.9* 37.0*  MCV 68.1*  --   PLT 312  --     Cardiac Enzymes: No results for input(s): CKTOTAL, CKMB, CKMBINDEX, TROPONINI in the last 168 hours.  BNP: BNP (last 3 results)  Recent Labs  02/21/15 1415 03/11/15 1702 03/21/15 1200  BNP 1554.1* 1184.5* 824.5*    ProBNP (last 3 results) No results for input(s): PROBNP in the last 8760 hours.    Other results:  Imaging: Dg Chest 2 View  03/21/2015   CLINICAL DATA:  Shortness of breath.  EXAM: CHEST  2 VIEW  COMPARISON:  03/11/2015  FINDINGS: Right PICC line is in place with the tip in the SVC, stable. Cardiomegaly with bilateral airspace opacities, increased since prior study. No effusions. No acute bony abnormality.  IMPRESSION: Worsening diffuse bilateral airspace disease, which could represent edema or infection. Favor edema.   Electronically Signed   By: Charlett Nose M.D.   On: 03/21/2015 15:24   Ct Head Wo  Contrast  03/22/2015   CLINICAL DATA:  Confusion. History of hypertension, diabetes, acute encephalopathy, acute renal failure.  EXAM: CT HEAD WITHOUT CONTRAST  TECHNIQUE: Contiguous axial images were obtained from the base of the skull through the vertex without intravenous contrast.  COMPARISON:  None available for comparison at time of study interpretation.  FINDINGS: The ventricles and sulci are upper limits of normal for age. No intraparenchymal hemorrhage, mass effect nor midline shift. No acute large vascular territory infarcts. Small area RIGHT frontal encephalomalacia.  No abnormal extra-axial fluid collections. Basal cisterns are patent. Mild calcific atherosclerosis of the carotid siphons.  No skull  fracture. Old RIGHT frontal craniotomy. The included ocular globes and orbital contents are non-suspicious. The mastoid aircells and included paranasal sinuses are well-aerated.  IMPRESSION: 1.  No acute intracranial process.  2.  Old RIGHT frontal craniotomy, RIGHT frontal encephalomalacia.  3. Mild global parenchymal brain volume loss for age.   Electronically Signed   By: Awilda Metro M.D.   On: 03/22/2015 00:53      Medications:     Scheduled Medications: . atorvastatin  40 mg Oral q1800  . buPROPion  150 mg Oral BID  . clopidogrel  75 mg Oral Daily  . DULoxetine  30 mg Oral Daily  . furosemide  120 mg Intravenous BID  . heparin  5,000 Units Subcutaneous 3 times per day  . hydrALAZINE  10 mg Oral 3 times per day  . insulin aspart  0-9 Units Subcutaneous TID WC  . insulin glargine  16 Units Subcutaneous BID  . isosorbide mononitrate  15 mg Oral Daily  . lipase/protease/amylase  3 capsule Oral TID AC  . magnesium oxide  400 mg Oral BID  . olopatadine  1 drop Both Eyes BID  . pantoprazole  40 mg Oral Daily  . sodium chloride  3 mL Intravenous Q12H  . sodium chloride  3 mL Intravenous Q12H  . cyanocobalamin  1,000 mcg Oral Daily     Infusions: . 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 A/C Systolic Heart Failure  3. CKD Stage III 4. DMII 5. H/P PAD Stent placed R common iliac 02/2015 6 Current tobacco   Plan/Discussion:   Still with AMS. Ammonia 31. May need lactulose to see if he mental status improves.    Volumes status ok given R/L heart failure. Stop IV lasix and switch to torsemide 20 mg twice a day. Continue hydralazine/imdur at current dose. Renal function 2.2    Continue PT   Length of Stay: 1   CLEGG,AMYNP-C   03/22/2015, 8:16 AM  Advanced Heart Failure Team Pager [phone] (M-F; 7a - 4p)  Please contact [facility] for night-coverage after hours (4p -7a ) and weekends on [email or web address]   Patient seen and examined with [name], NP. We discussed all aspects of the encounter. I agree with the assessment and plan as stated above.   Volume status looks ok for him. Weight is below baseline. Main issue remains his encephalopathy. Despite low ammonia level I still think this is likely hepatic encephalopathy given chronically elevated R-sided heart pressures and asterixis on exam. Would suggest trial of lactulose 20 tid. Change IV lasix to po demadex.   Bensimhon, Daniel,MD 8:50 AM

## 2015-03-23 DIAGNOSIS — Z7189 Other specified counseling: Secondary | ICD-10-CM | POA: Diagnosis present

## 2015-03-23 DIAGNOSIS — R410 Disorientation, unspecified: Secondary | ICD-10-CM | POA: Diagnosis present

## 2015-03-23 LAB — BASIC METABOLIC PANEL
Anion gap: 12 (ref 5–15)
BUN: 47 mg/dL — AB (ref 6–20)
CALCIUM: 9 mg/dL (ref 8.9–10.3)
CO2: 33 mmol/L — ABNORMAL HIGH (ref 22–32)
CREATININE: 1.71 mg/dL — AB (ref 0.61–1.24)
Chloride: 91 mmol/L — ABNORMAL LOW (ref 101–111)
GFR calc Af Amer: 51 mL/min — ABNORMAL LOW (ref >60)
GFR, EST NON AFRICAN AMERICAN: 44 mL/min — AB (ref >60)
GLUCOSE: 204 mg/dL — AB (ref 65–99)
POTASSIUM: 3.3 mmol/L — AB (ref 3.5–5.1)
SODIUM: 136 mmol/L (ref 135–145)

## 2015-03-23 LAB — URINE CULTURE

## 2015-03-23 LAB — GLUCOSE, CAPILLARY
Glucose-Capillary: 202 mg/dL — ABNORMAL HIGH (ref 65–99)
Glucose-Capillary: 198 mg/dL — ABNORMAL HIGH (ref 65–99)
Glucose-Capillary: 182 mg/dL — ABNORMAL HIGH (ref 65–99)

## 2015-03-23 MED ORDER — ISOSORBIDE MONONITRATE ER 30 MG PO TB24
30.0000 mg | ORAL_TABLET | Freq: Every day | ORAL | Status: DC
  Administered 2015-03-23 – 2015-03-29 (×7): 30 mg via ORAL
  Filled 2015-03-23 (×8): qty 1

## 2015-03-23 MED ORDER — HYDRALAZINE HCL 25 MG PO TABS
25.0000 mg | ORAL_TABLET | Freq: Three times a day (TID) | ORAL | Status: DC
  Administered 2015-03-23 – 2015-03-29 (×18): 25 mg via ORAL
  Filled 2015-03-23 (×20): qty 1

## 2015-03-23 MED ORDER — POTASSIUM CHLORIDE CRYS ER 20 MEQ PO TBCR
40.0000 meq | EXTENDED_RELEASE_TABLET | Freq: Two times a day (BID) | ORAL | Status: DC
  Administered 2015-03-23 – 2015-03-27 (×9): 40 meq via ORAL
  Filled 2015-03-23 (×9): qty 2

## 2015-03-23 NOTE — Progress Notes (Signed)
Advanced Heart Failure Rounding Note   Subjective:    Admitted with AMS and volume overload. Ammonia on admit 31. CT of head no acute process.   Today mental status much improved. Denies SOB/Orthopnea.       UA negative UDS negative  Objective:   Weight Range:  Vital Signs:   Temp:  [97.5 F (36.4 C)-98.5 F (36.9 C)] 97.5 F (36.4 C) (09/16 0739) Pulse Rate:  [95-103] 98 (09/16 0500) Resp:  [13-24] 19 (09/16 0500) BP: (114-135)/(61-92) 128/70 mmHg (09/16 0500) SpO2:  [91 %-98 %] 96 % (09/16 0500) Weight:  [182 lb 5.1 oz (82.7 kg)] 182 lb 5.1 oz (82.7 kg) (09/16 0400) Last BM Date: 03/23/15  Weight change: Filed Weights   03/21/15 1915 03/22/15 0500 03/23/15 0400  Weight: 189 lb 6 oz (85.9 kg) 188 lb 11.4 oz (85.6 kg) 182 lb 5.1 oz (82.7 kg)    Intake/Output:   Intake/Output Summary (Last 24 hours) at 03/23/15 0908 Last data filed at 03/23/15 0512  Gross per 24 hour  Intake    504 ml  Output   2250 ml  Net  -1746 ml     Physical Exam: General: Chronically ill appearing. No resp difficulty . In chair.  HEENT: normal Neck: supple. JVP 9-10  . Carotids 2+ bilat; no bruits. No lymphadenopathy or thryomegaly appreciated. Cor: PMI nondisplaced. Regular rate & rhythm. No rubs, gallops or murmurs. Lungs: clear Abdomen: soft, nontender, nondistended. No hepatosplenomegaly. No bruits or masses. Good bowel sounds. Extremities: no cyanosis, clubbing, rash, R and LLE trace edema. Extremities warm. RUE PICC  Neuro: Drowsy. Alert x to self. cranial nerves grossly intact. moves all 4 extremities w/o difficulty. Weak lower extremities. Affect pleasant. + asterixis  Telemetry: SR 90 BPM  Labs: Basic Metabolic Panel:  Recent Labs Lab 03/21/15 1207 03/22/15 0530 03/23/15 0420  NA 131* 132* 136  K 3.6 3.6 3.3*  CL 86* 87* 91*  CO2  --  34* 33*  GLUCOSE 196* 125* 204*  BUN 54* 57* 47*  CREATININE 2.20* 2.21* 1.71*  CALCIUM  --  9.2 9.0    Liver Function  Tests:  Recent Labs Lab 03/21/15 1906  AST 37  ALT 18  ALKPHOS 352*  BILITOT 2.6*  PROT 7.6  ALBUMIN 2.6*   No results for input(s): LIPASE, AMYLASE in the last 168 hours.  Recent Labs Lab 03/21/15 1620  AMMONIA 31    CBC:  Recent Labs Lab 03/21/15 1200 03/21/15 1207 03/22/15 0530  WBC 9.8  --  8.7  NEUTROABS 7.4  --   --   HGB 9.6* 12.6* 9.1*  HCT 29.9* 37.0* 28.2*  MCV 68.1*  --  67.0*  PLT 312  --  311    Cardiac Enzymes: No results for input(s): CKTOTAL, CKMB, CKMBINDEX, TROPONINI in the last 168 hours.  BNP: BNP (last 3 results)  Recent Labs  02/21/15 1415 03/11/15 1702 03/21/15 1200  BNP 1554.1* 1184.5* 824.5*    ProBNP (last 3 results) No results for input(s): PROBNP in the last 8760 hours.    Other results:  Imaging: Dg Chest 2 View  03/22/2015   CLINICAL DATA:  Shortness of breath and weakness.  EXAM: CHEST  2 VIEW  COMPARISON:  03/21/2015  FINDINGS: Right-sided PICC line is in place, tip overlying the superior vena cava.  The heart is enlarged. There are diffuse airspace filling opacities bilaterally, consistent with edema or infectious process. The appearance is largely stable. No definite pleural effusions. There are chronic  changes in the left shoulder possibly posttraumatic.  IMPRESSION: Stable cardiomegaly and diffuse airspace filling opacities.   Electronically Signed   By: Norva Pavlov M.D.   On: 03/22/2015 10:11   Dg Chest 2 View  03/21/2015   CLINICAL DATA:  Shortness of breath.  EXAM: CHEST  2 VIEW  COMPARISON:  03/11/2015  FINDINGS: Right PICC line is in place with the tip in the SVC, stable. Cardiomegaly with bilateral airspace opacities, increased since prior study. No effusions. No acute bony abnormality.  IMPRESSION: Worsening diffuse bilateral airspace disease, which could represent edema or infection. Favor edema.   Electronically Signed   By: Charlett Nose M.D.   On: 03/21/2015 15:24   Ct Head Wo Contrast  03/22/2015    CLINICAL DATA:  Confusion. History of hypertension, diabetes, acute encephalopathy, acute renal failure.  EXAM: CT HEAD WITHOUT CONTRAST  TECHNIQUE: Contiguous axial images were obtained from the base of the skull through the vertex without intravenous contrast.  COMPARISON:  None available for comparison at time of study interpretation.  FINDINGS: The ventricles and sulci are upper limits of normal for age. No intraparenchymal hemorrhage, mass effect nor midline shift. No acute large vascular territory infarcts. Small area RIGHT frontal encephalomalacia.  No abnormal extra-axial fluid collections. Basal cisterns are patent. Mild calcific atherosclerosis of the carotid siphons.  No skull fracture. Old RIGHT frontal craniotomy. The included ocular globes and orbital contents are non-suspicious. The mastoid aircells and included paranasal sinuses are well-aerated.  IMPRESSION: 1.  No acute intracranial process.  2.  Old RIGHT frontal craniotomy, RIGHT frontal encephalomalacia.  3. Mild global parenchymal brain volume loss for age.   Electronically Signed   By: Awilda Metro M.D.   On: 03/22/2015 00:53     Medications:     Scheduled Medications: . atorvastatin  40 mg Oral q1800  . buPROPion  150 mg Oral BID  . clopidogrel  75 mg Oral Daily  . DULoxetine  30 mg Oral Daily  . feeding supplement (GLUCERNA SHAKE)  237 mL Oral TID BM  . heparin  5,000 Units Subcutaneous 3 times per day  . hydrALAZINE  10 mg Oral 3 times per day  . insulin aspart  0-9 Units Subcutaneous TID WC  . insulin glargine  16 Units Subcutaneous BID  . isosorbide mononitrate  15 mg Oral Daily  . lipase/protease/amylase  3 capsule Oral TID AC  . magnesium oxide  400 mg Oral BID  . multivitamin with minerals  1 tablet Oral Daily  . olopatadine  1 drop Both Eyes BID  . pantoprazole  40 mg Oral Daily  . sodium chloride  3 mL Intravenous Q12H  . sodium chloride  3 mL Intravenous Q12H  . torsemide  20 mg Oral BID  .  cyanocobalamin  1,000 mcg Oral Daily    Infusions: . 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 A/C Systolic Heart Failure  3. CKD Stage III 4. DMII 5. H/P PAD Stent placed R common iliac 02/2015 6 Current tobacco  7. Hypokalemia.   Plan/Discussion:   Metal status much improved seems back to baseline.     Volumes status ok given R/L heart failure. Continue milrinone 0.375 mcg. Continue torsemide 20 mg twice a day. No bb with cardiogenic shock. Switch back to hydralazine 25 mg tid and imdur  30 mg daily. No Ace with CKD. Renal function down 1.7 . D/C foley   Continue PT  Already set up with [facility] for home milrinone.   Palliative Care Following.  Length of Stay: 2   CLEGG,AMYNP-C  03/23/2015, 9:08 AM  Advanced Heart Failure Team Pager [phone] (M-F; 7a - 4p)  Please contact [facility] for night-coverage after hours (4p -7a ) and weekends on [email or web address]  Patient seen and examined with [name], NP. We discussed all aspects of the encounter. I agree with the assessment and plan as stated above.   Mental status improving but not completely back to baseline. HF is stable. Continue milrinone and resume home HF meds. I still worry he has a component of hepatic encephalopathy (in setting of chronically elevated R-sided pressures) despite low ammonia as he has asterixis on exam. Would consider empiric trial of low-dose lactulose 20 bid.   Bensimhon, Daniel,MD 11:10 AM

## 2015-03-23 NOTE — Progress Notes (Signed)
[place] TEAM 1 - Stepdown/ICU TEAM PROGRESS NOTE  Christian Wall NGE:[id number] DOB: 1960/11/09 DOA: 03/21/2015 PCP: Dorrene German, MD  Admit HPI / Brief Narrative: 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 He was discharged from the cardiology service 03/13/2015 after being treated for acute on chronic systolic heart failure. His wife said he was doing fairly well until he began to become confused.   In the emergency department his WBC was normal, BNP was 824, creatinine 2.2 (elevated from previous but still within his baseline), BUN slightly elevated. Chest x-ray showed bilateral airspace disease-pulmonary edema versus infection.  HPI/Subjective: The patient's wife states that he is more alert in general but does continue to have episodes of waxing and waning mental status with significant somnolence.  The patient himself denies chest pain shortness of breath fevers chills nausea or vomiting.  Assessment/Plan:  Acute encephalopathy -UDS negative -Head CT nondiagnostic -Possibly iatrogenic:  patient on OxyIR, Lyrica, Zanaflex, and tramadol at home. In a patient with chronic renal failure could have built to a toxic level -?hepatic encephalopathy - doubt given low ammonia - will give trial of lactulose and follow  -Hold all sedating medications i.e. narcotics, benzodiazepine's, muscle relaxants  Chronic Systolic CHF (NICM), NYHA III  -care per CHF Team  Essential HTN -BP well controlled   DM Type IIuncontrolled -Over the last year patient's hemoglobin A1c has gone from 15.2--> 7.4 showing significant improvement. - CBGs fairly well controlled - no change in tx plan today   HLD -Lipid panel within ADA guidelines  -Continue Lipitor   Acute on chronic kidney failure stage III  (baseline Cr 1.29-3.22) -Cr now within patient's normal range - follow   Peripheral neuropathy -Currently not complaining of symptoms  Depression -Continue Cymbalta 30 mg daily  Gout - Right knee pain  -9/14 Uric acid 14.8 -Currently not complaining of pain - knee w/o calor or erythema   Hypokalemia -replace and follow - check Mg  Wounds  -Sacral and lower extremity -Wound Care RN assistance requested  Tobacco Abuse  -Patient states stopped smoking ~2 months ago;  encouraged to continue to not smoke  Code Status: FULL Family Communication: Spoke with wife at bedside at length Disposition Plan: SDU  Consultants: Heart failure team  Procedures: None  Antibiotics: None  DVT prophylaxis: SCDs  Objective: Blood pressure 128/70, pulse 98, temperature 98.2 F (36.8 C), temperature source Oral, resp. rate 19, height  (1.727 m), weight 82.7 kg (182 lb 5.1 oz), SpO2 96 %.  Intake/Output Summary (Last 24 hours) at 03/23/15 1644 Last data filed at 03/23/15 1529  Gross per 24 hour  Intake  736.8 ml  Output   2150 ml  Net -1413.2 ml   Exam: General: No acute respiratory distress - alert but somnolent Lungs: Clear to auscultation bilaterally without wheezes or crackles Cardiovascular: Regular rate and rhythm without murmur gallop or rub normal S1 and S2 Abdomen: Nontender, nondistended, soft, bowel sounds positive, no rebound, no ascites, no appreciable mass Extremities: No significant cyanosis, clubbing, or edema bilateral lower extremities  Data Reviewed: Basic Metabolic Panel:  Recent Labs Lab 03/21/15 1207 03/22/15 0530 03/23/15 0420  NA 131* 132* 136  K 3.6 3.6 3.3*  CL 86* 87* 91*  CO2  --  34* 33*  GLUCOSE 196* 125* 204*  BUN 54*  57* 47*  CREATININE 2.20* 2.21* 1.71*  CALCIUM  --  9.2 9.0    CBC:  Recent Labs Lab 03/21/15 1200 03/21/15 1207 03/22/15 0530  WBC 9.8  --  8.7  NEUTROABS 7.4  --   --   HGB 9.6* 12.6* 9.1*  HCT 29.9* 37.0*  28.2*  MCV 68.1*  --  67.0*  PLT 312  --  311    Liver Function Tests:  Recent Labs Lab 03/21/15 1906  AST 37  ALT 18  ALKPHOS 352*  BILITOT 2.6*  PROT 7.6  ALBUMIN 2.6*    Recent Labs Lab 03/21/15 1620  AMMONIA 31   CBG:  Recent Labs Lab 03/22/15 1225 03/22/15 1555 03/22/15 2119 03/23/15 0734 03/23/15 1221  GLUCAP 140* 175* 192* 202* 198*    Recent Results (from the past 240 hour(s))  Culture, blood (routine x 2)     Status: None (Preliminary result)   Collection Time: 03/21/15  5:44 PM  Result Value Ref Range Status   Specimen Description BLOOD LEFT ARM  Final   Special Requests BOTTLES DRAWN AEROBIC AND ANAEROBIC 10CC  Final   Culture NO GROWTH 2 DAYS  Final   Report Status PENDING  Incomplete  Urine culture     Status: None   Collection Time: 03/21/15  6:33 PM  Result Value Ref Range Status   Specimen Description URINE, CLEAN CATCH  Final   Special Requests NONE  Final   Culture MULTIPLE SPECIES PRESENT, SUGGEST RECOLLECTION  Final   Report Status 03/23/2015 FINAL  Final  Culture, blood (routine x 2)     Status: None (Preliminary result)   Collection Time: 03/21/15  7:40 PM  Result Value Ref Range Status   Specimen Description BLOOD LEFT HAND  Final   Special Requests IN PEDIATRIC BOTTLE 2CC  Final   Culture NO GROWTH 2 DAYS  Final   Report Status PENDING  Incomplete     Studies:   Recent x-ray studies have been reviewed in detail by the Attending Physician  Scheduled Meds:  Scheduled Meds: . atorvastatin  40 mg Oral q1800  . buPROPion  150 mg Oral BID  . clopidogrel  75 mg Oral Daily  . DULoxetine  30 mg Oral Daily  . feeding supplement (GLUCERNA SHAKE)  237 mL Oral TID BM  . heparin  5,000 Units Subcutaneous 3 times per day  . hydrALAZINE  25 mg Oral 3 times per day  . insulin aspart  0-9 Units Subcutaneous TID WC  . insulin glargine  16 Units Subcutaneous BID  . isosorbide mononitrate  30 mg Oral Daily  . lipase/protease/amylase  3  capsule Oral TID AC  . magnesium oxide  400 mg Oral BID  . multivitamin with minerals  1 tablet Oral Daily  . olopatadine  1 drop Both Eyes BID  . pantoprazole  40 mg Oral Daily  . potassium chloride  40 mEq Oral BID  . sodium chloride  3 mL Intravenous Q12H  . sodium chloride  3 mL Intravenous Q12H  . torsemide  20 mg Oral BID  . cyanocobalamin  1,000 mcg Oral Daily    Time spent on care of this patient: 35 mins   MCCLUNG,JEFFREY T , MD   [facility] Office  [phone] Pager - Text Page per Loretha Stapler as per below:  On-Call/Text Page:      [email or web address]      password [email or web address]  If 7PM-7AM, please contact night-coverage [email or web address] Password [email or web address] 03/23/2015, 4:44 PM  LOS: 2 days

## 2015-03-23 NOTE — Care Management Important Message (Signed)
Important Message  Patient Details  Name: Christian Wall MRN: [id number] Date of Birth: 09-Aug-1960   Medicare Important Message Given:  Yes-second notification given    Kyla Balzarine 03/23/2015, 11:31 AM

## 2015-03-23 NOTE — Progress Notes (Signed)
Benedetto Coons ( mid level) text paged BMP results this morning, await response.

## 2015-03-23 NOTE — Progress Notes (Signed)
PT Cancellation Note  Patient Details Name: Christian Wall MRN: [id number] DOB: [birth year]   Cancelled Treatment:    Reason Eval/Treat Not Completed: Patient declined, no reason specified.  Attempted multiple times to encourage him to participate without success.  Will try back Monday as able. 03/23/2015  Gregory Bing, PT [phone]  (pager)   Christian Wall, Eliseo Gum 03/23/2015, 4:24 PM

## 2015-03-23 NOTE — Progress Notes (Signed)
Rehab Admissions Coordinator Note:  Patient was screened by Christian Wall for appropriateness for an Inpatient Acute Rehab Consult per OT recommendation.  At this time, we are recommending HH with hospice support. Noted palliative discussions.  Christian Wall 03/23/2015, 3:11 PM  I can be reached at [phone].

## 2015-03-23 NOTE — Progress Notes (Signed)
Daily Progress Note   Patient Name: Christian Wall       Date: 03/23/2015 DOB: June 22, 1961  Age: 54 y.o. MRN#: [id number] Attending Physician: Cherene Altes, MD Primary Care Physician: Philis Fendt, MD Admit Date: 03/21/2015  Reason for Consultation/Follow-up: Establishing goals of care  Subjective: Christian Wall is a 54 year old male with history of chronic systolic heart failure on milrinone, polysubstance abuse, diabetes, peripheral neuropathy and 5 hospitalizations in the last 6 months. He is admitted with altered mental status. Palliative was consulted for goals of care. Interval Events: I met with Christian Wall and his wife today. He is still intermittently confused but is much clearer today than yesterday. We continue discussion regarding his chronic disease as well as goals of care moving forward. He and his wife are both very polite during encounter, but they did not seem to fully engage in conversation. He would often watch television when trying to discuss anything related to his chronic illness.  Length of Stay: 2 days  Current Medications: Scheduled Meds:  . atorvastatin  40 mg Oral q1800  . buPROPion  150 mg Oral BID  . clopidogrel  75 mg Oral Daily  . DULoxetine  30 mg Oral Daily  . feeding supplement (GLUCERNA SHAKE)  237 mL Oral TID BM  . heparin  5,000 Units Subcutaneous 3 times per day  . hydrALAZINE  25 mg Oral 3 times per day  . insulin aspart  0-9 Units Subcutaneous TID WC  . insulin glargine  16 Units Subcutaneous BID  . isosorbide mononitrate  30 mg Oral Daily  . lipase/protease/amylase  3 capsule Oral TID AC  . magnesium oxide  400 mg Oral BID  . multivitamin with minerals  1 tablet Oral Daily  . olopatadine  1 drop Both Eyes BID  . pantoprazole  40 mg Oral Daily  . potassium chloride  40 mEq Oral BID  . torsemide  20 mg Oral BID  . cyanocobalamin  1,000 mcg Oral Daily    Continuous Infusions: . milrinone 0.375 mcg/kg/min (03/23/15 0512)     PRN Meds: acetaminophen **OR** acetaminophen, albuterol, MUSCLE RUB, ondansetron **OR** ondansetron (ZOFRAN) IV, senna-docusate  Palliative Performance Scale: 40%     Vital Signs: BP 128/70 mmHg  Pulse 98  Temp(Src) 97.9 F (36.6 C) (Oral)  Resp 19  Ht 5' 8"  (1.727 m)  Wt 82.7 kg (182 lb 5.1 oz)  BMI 27.73 kg/m2  SpO2 96% SpO2: SpO2: 96 % O2 Device: O2 Device: Nasal Cannula O2 Flow Rate: O2 Flow Rate (L/min): 2 L/min  Intake/output summary:  Intake/Output Summary (Last 24 hours) at 03/23/15 1831 Last data filed at 03/23/15 1713  Gross per 24 hour  Intake  577.6 ml  Output   2151 ml  Net -1573.4 ml   LBM:   Baseline Weight: Weight: 85.9 kg (189 lb 6 oz) Most recent weight: Weight: 82.7 kg (182 lb 5.1 oz)  Physical Exam: General: Chronically ill appearing. No resp difficulty commands HEENT: normal Neck: supple. JVP to ear . CV: Regular rate & rhythm. No rubs, gallops or murmurs. Lungs: clear Abdomen: soft, nontender, nondistended. No bruits or masses. Good bowel sounds. Extremities: no cyanosis, clubbing, rash, R and LLE trace edema. Extremities warm. RUE PICC  Neuro: Drowsy. Moves 4 extremities   Additional Data Reviewed: Recent Labs     03/21/15  1200   03/21/15  1207  03/22/15  0530  03/23/15  0420  WBC  9.8   --    --  8.7   --   HGB  9.6*   --   12.6*  9.1*   --   PLT  312   --    --   311   --   NA   --    < >  131*  132*  136  BUN   --    < >  54*  57*  47*  CREATININE   --    < >  2.20*  2.21*  1.71*   < > = values in this interval not displayed.     Problem List:  Patient Active Problem List   Diagnosis Date Noted  . Pulmonary hypertension   . Systolic murmur   . Right knee pain   . Acute encephalopathy 03/21/2015  . PAD (peripheral artery disease) 03/13/2015  . CKD (chronic kidney disease) stage 3, GFR 30-59 ml/min 03/13/2015  . Pressure ulcer 03/12/2015  . Acute combined systolic and diastolic heart failure, NYHA class 4  03/11/2015  . Shock, cardiogenic 02/23/2015  . Lower limb ischemia 02/23/2015  . Hospital acquired PNA 02/23/2015  . Acute on chronic systolic heart failure 11/94/1740  . Limb ischemia 02/21/2015  . Ischemic neuropathy of foot 02/21/2015  . Acute on chronic renal failure   . HLD (hyperlipidemia)   . Acute renal failure superimposed on stage 3 chronic kidney disease   . Thrombocytopenia   . Diabetes type 2, uncontrolled   . Depression   . Tobacco abuse   . Acute kidney injury   . Acute on chronic systolic CHF (congestive heart failure)   . Acute renal failure (ARF) 01/30/2015  . AKI (acute kidney injury) 01/30/2015  . Chronic systolic CHF (congestive heart failure) 01/10/2015  . Chronic systolic heart failure 81/44/8185  . Upper airway cough syndrome 12/19/2014  . Cigarette smoker 12/19/2014  . Dyspnea   . Palliative care encounter 11/20/2014  . Weakness 11/20/2014  . Acute renal failure   . CHF (congestive heart failure) 11/13/2014  . Acute systolic CHF (congestive heart failure), NYHA class 3 11/13/2014  . Essential hypertension 11/13/2014  . Hypoglycemia 11/13/2014  . Chronic renal insufficiency 11/13/2014  . Diabetes mellitus type II, uncontrolled 11/13/2014  . Peripheral neuropathy 11/13/2014  . Dehydration 11/10/2013  . Non-adherence to medical treatment 11/10/2013  . Poor venous access 11/10/2013  . Anemia 11/10/2013  . Abnormal EKG 11/10/2013  . Acute Metabolic encephalopathy 63/14/9702  . Alcoholism 11/10/2013  . Esophageal thickening 11/10/2013  . Gastric wall thickening 11/10/2013  . Left Renal lesion 11/10/2013  . DKA (diabetic ketoacidoses) 11/09/2013  . Hyperosmolar non-ketotic state in patient with type 2 diabetes mellitus 11/09/2013  . ARF (acute renal failure) 11/09/2013  . Chest pain 11/09/2013  . Herniation of cervical intervertebral disc with radiculopathy 04/13/2012     Palliative Care Assessment & Plan    Code Status:  Full code  Goals of  Care:  I met with the patient is wife today. He seems much more clear mentally, but she reports still not back to his baseline. I'm not sure how much he is processing the information we talk about as he often appears to be looking through me and watching TV. I'm unsure if this is due to his confusion, a matter of poor insight into his medical condition, or if he is emotionally not ready discuss his chronic medical problems. We talked about many of the things that his wife and I covered yesterday. Which included discussion that the hospital can be  useful as long as he is getting well enough from care he receives at the hospital to enjoy his time at home, but there is going to come a time in the near future where hospital level care is unlikely to continue to provide more quality time at home. We also discussed that in light of multiple chronic medical problems that have worsened with this acute problem, care should be focused on interventions that are likely to allow the him to achieve goal of getting back to home and spending time with family. We briefly discussed heroic interventions at the end-of-life and that this is not likely to lead to him getting well enough to go back home.  Both he and his wife report they need time to process information and that they feel they need to discuss further with his cardiologist to see where he feels he is at in his disease trajectory. I asked him to be okay to follow-up with them and they reported there agreeable to meeting again tomorrow.  Symptom Management:  Confusion: Likely multifactorial with unclear specific etiology. Management per primary.   Psycho-social/Spiritual:  Desire for further Chaplaincy support:no   Prognosis: Unable to determine Discharge Planning: To be determined   Care plan was discussed with patient and his wife  Thank you for allowing the Palliative Medicine Team to assist in the care of this patient.   Time In: 1700 Time Out:  1730 Total Time 30 Prolonged Time Billed  no     Greater than 50%  of this time was spent counseling and coordinating care related to the above assessment and plan.   Micheline Rough, MD  03/23/2015, 6:31 PM  Please contact Palliative Medicine Team phone at [phone] for questions and concerns.

## 2015-03-23 NOTE — Care Management Note (Addendum)
Case Management Note  Patient Details  Name: Christian Wall MRN: [id number] Date of Birth: Dec 25, 1960  Subjective/Objective:             Admitted with sob, confusion, recent falls,   h/o chronic systolic heart failure on milrinone drip, polysubstance abuse, DM, peripheral neuropathy, old right frontal craniotomy. ADL's independent, uses cane with ambulation.                  Action/Plan: Return to home when medically stable. CM to f/u with d/c disposition.  Expected Discharge Date:                  Expected Discharge Plan:  Home w Home Health Services  In-House Referral:     Discharge planning Services  CM Consult  Post Acute Care Choice:  Resumption of Svcs/PTA Provider Choice offered to:  Patient  DME Arranged:   (iv milrinone) DME Agency:  [facility] (iv mirinone)  HH Arranged:  RN (iv milrinone, Diseae management) HH Agency:  [facility]  Status of Service:  In process, will continue to follow  Medicare Important Message Given:    Date Medicare IM Given:    Medicare IM give by:    Date Additional Medicare IM Given:    Additional Medicare Important Message give by:     If discussed at Long Length of Stay Meetings, dates discussed:    Additional Comments:  Palliative team to f/u on 03/24/15 to establish goals of care.  Christian Wall (Spouse) [phone]   Christian Wall, [place] [phone] 03/23/2015, 11:05 AM

## 2015-03-23 NOTE — Evaluation (Signed)
Occupational Therapy Evaluation Patient Details Name: Christian Wall MRN: [id number] DOB: Jan 27, 1961 Today's Date: 03/23/2015    History of Present Illness 54 year old gentleman with prior h/o chronic systolic heart failure on milrinone drip, polysubstance abuse, DM, peripheral neuropathy, old right frontal craniotomy; recently discharged from cardiology service on 9/6, presented today with worsening sob and confusion and a recent fall   Clinical Impression   This 54 yo male admitted with above presents to acute OT with cognitive issues, decreased mobility, decreased balance, decreased use of Bil UEs, all affecting his ability to care for himself. He will benefit from acute OT with follow up OT on CIR.    Follow Up Recommendations  CIR    Equipment Recommendations   (TBD at next venue)       Precautions / Restrictions Precautions Precautions: Fall Restrictions Weight Bearing Restrictions: No      Mobility Bed Mobility Overal bed mobility: Needs Assistance Bed Mobility: Supine to Sit     Supine to sit: Mod assist        Transfers Overall transfer level: Needs assistance   Transfers: Squat Pivot Transfers     Squat pivot transfers: Max assist          Balance Overall balance assessment: Needs assistance Sitting-balance support: Bilateral upper extremity supported;Feet supported Sitting balance-Leahy Scale: Poor Sitting balance - Comments: tendency to posterior lean Postural control: Posterior lean                                  ADL Overall ADL's : Needs assistance/impaired Eating/Feeding: Supervision/ safety;Set up;Sitting Eating/Feeding Details (indicate cue type and reason): VCs that he is dropping his food and then where to find it Grooming: Supervision/safety;Set up;Sitting;Wash/dry face Grooming Details (indicate cue type and reason): VCs for thoroughness Upper Body Bathing: Supervision/ safety;Set up;Sitting Upper Body Bathing  Details (indicate cue type and reason): with VCs for throughness Lower Body Bathing: Total assistance (with max A partial stand)   Upper Body Dressing : Moderate assistance;Sitting   Lower Body Dressing: Total assistance (with mod A partial stand)   Toilet Transfer: Maximal assistance;Squat-pivot (bed>recliner going to his left)   Toileting- Clothing Manipulation and Hygiene: Total assistance (with Mod A partial stand)               Vision Vision Assessment?: Vision impaired- to be further tested in functional context Additional Comments: wears glasses for reading he reports when asked. visual attention waxes and wanes                Hand Dominance  (I think my left)   Extremity/Trunk Assessment Upper Extremity Assessment Upper Extremity Assessment: Generalized weakness (also noted decreasd proprioception and ataxia when working on eating his breakfast)           Communication Communication Communication: Expressive difficulties   Cognition Arousal/Alertness: Awake/alert Behavior During Therapy: Flat affect Overall Cognitive Status: No family/caregiver present to determine baseline cognitive functioning Area of Impairment: Orientation;Attention;Following commands;Awareness Orientation Level: Disoriented to;Situation;Time Current Attention Level: Sustained   Following Commands: Follows one step commands with increased time;Follows one step commands inconsistently     Problem Solving: Slow processing;Decreased initiation;Difficulty sequencing;Requires verbal cues;Requires tactile cues                Home Living Family/patient expects to be discharged to:: Private residence Living Arrangements: Spouse/significant other Available Help at Discharge: Family  Additional Comments: pt unable to provide PLOF secondary to AMS. He does state he lives with wife and walks with crutches      Prior Functioning/Environment           Comments:  (Unknown)    OT Diagnosis: Generalized weakness;Cognitive deficits   OT Problem List: Decreased strength;Impaired balance (sitting and/or standing);Decreased cognition;Decreased coordination;Impaired UE functional use   OT Treatment/Interventions: Self-care/ADL training;Therapeutic activities;DME and/or AE instruction;Patient/family education;Balance training;Visual/perceptual remediation/compensation;Cognitive remediation/compensation    OT Goals(Current goals can be found in the care plan section) Acute Rehab OT Goals Patient Stated Goal: to OOB OT Goal Formulation: With patient Time For Goal Achievement: 04/06/15 Potential to Achieve Goals: Good  OT Frequency: Min 3X/week          End of Session Equipment Utilized During Treatment: Gait belt Nurse Communication:  (+2)  Activity Tolerance: Patient tolerated treatment well Patient left: in chair;with call bell/phone within reach;with chair alarm set   Time: 0981-1914 OT Time Calculation (min): 27 min Charges:  OT General Charges $OT Visit: 1 Procedure OT Evaluation $Initial OT Evaluation Tier I: 1 Procedure OT Treatments $Self Care/Home Management : 8-22 mins  Evette Georges [phone] 03/23/2015, 10:45 AM

## 2015-03-24 DIAGNOSIS — R011 Cardiac murmur, unspecified: Secondary | ICD-10-CM

## 2015-03-24 DIAGNOSIS — I10 Essential (primary) hypertension: Secondary | ICD-10-CM

## 2015-03-24 DIAGNOSIS — R410 Disorientation, unspecified: Secondary | ICD-10-CM

## 2015-03-24 LAB — COMPREHENSIVE METABOLIC PANEL
ALBUMIN: 2.3 g/dL — AB (ref 3.5–5.0)
ALK PHOS: 331 U/L — AB (ref 38–126)
ALT: 16 U/L — AB (ref 17–63)
ANION GAP: 10 (ref 5–15)
AST: 22 U/L (ref 15–41)
BILIRUBIN TOTAL: 2 mg/dL — AB (ref 0.3–1.2)
BUN: 44 mg/dL — ABNORMAL HIGH (ref 6–20)
CALCIUM: 8.9 mg/dL (ref 8.9–10.3)
CO2: 36 mmol/L — AB (ref 22–32)
CREATININE: 1.54 mg/dL — AB (ref 0.61–1.24)
Chloride: 91 mmol/L — ABNORMAL LOW (ref 101–111)
GFR calc Af Amer: 58 mL/min — ABNORMAL LOW (ref >60)
GFR calc non Af Amer: 50 mL/min — ABNORMAL LOW (ref >60)
GLUCOSE: 172 mg/dL — AB (ref 65–99)
Potassium: 3.6 mmol/L (ref 3.5–5.1)
SODIUM: 137 mmol/L (ref 135–145)
TOTAL PROTEIN: 6.8 g/dL (ref 6.5–8.1)

## 2015-03-24 LAB — GLUCOSE, CAPILLARY
GLUCOSE-CAPILLARY: 161 mg/dL — AB (ref 65–99)
GLUCOSE-CAPILLARY: 128 mg/dL — AB (ref 65–99)
GLUCOSE-CAPILLARY: 139 mg/dL — AB (ref 65–99)
Glucose-Capillary: 155 mg/dL — ABNORMAL HIGH (ref 65–99)
Glucose-Capillary: 152 mg/dL — ABNORMAL HIGH (ref 65–99)

## 2015-03-24 LAB — CBC
HEMATOCRIT: 29.3 % — AB (ref 39.0–52.0)
HEMOGLOBIN: 9 g/dL — AB (ref 13.0–17.0)
MCH: 21.2 pg — AB (ref 26.0–34.0)
MCHC: 30.7 g/dL (ref 30.0–36.0)
MCV: 68.9 fL — AB (ref 78.0–100.0)
Platelets: 271 10*3/uL (ref 150–400)
RBC: 4.25 MIL/uL (ref 4.22–5.81)
RDW: 19.9 % — AB (ref 11.5–15.5)
WBC: 8.5 10*3/uL (ref 4.0–10.5)

## 2015-03-24 LAB — AMMONIA: Ammonia: 27 umol/L (ref 9–35)

## 2015-03-24 LAB — TSH: TSH: 1.675 u[IU]/mL (ref 0.350–4.500)

## 2015-03-24 LAB — VITAMIN B12: Vitamin B-12: 2558 pg/mL — ABNORMAL HIGH (ref 180–914)

## 2015-03-24 LAB — FOLATE: Folate: 21.9 ng/mL (ref >5.9)

## 2015-03-24 NOTE — Progress Notes (Addendum)
[place] TEAM 1 - Stepdown/ICU TEAM PROGRESS NOTE  Christian Wall NWG:[id number] DOB: 04/21/1961 DOA: 03/21/2015 PCP: Dorrene German, MD  Admit HPI / Brief Narrative: 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 He was discharged from the cardiology service 03/13/2015 after being treated for acute on chronic systolic heart failure. His wife said he was doing fairly well until he began to become confused.   In the emergency department his WBC was normal, BNP was 824, creatinine 2.2 (elevated from previous but still within his baseline), BUN slightly elevated. Chest x-ray showed bilateral airspace disease-pulmonary edema versus infection.  HPI/Subjective: The patient is much more alert but per his wife is not quite back to his baseline.  He is frustrated and tired of being in the hospital.  He denies chest pain shortness breath fevers chills nausea or vomiting.  His primary complaint is that the food here is terrible.  Assessment/Plan:  Acute encephalopathy -UDS negative -B12 normal/high - folate normal - TSH normal  -Head CT nondiagnostic -Possibly iatrogenic:  patient on OxyIR, Lyrica, Zanaflex, and tramadol at home - holding all sedating medications  -?hepatic encephalopathy - doubt given low ammonia - ammonia level stable therefore will not start lactulose   Chronic Systolic CHF (NICM), NYHA III  -care per CHF Team  Essential HTN -BP well controlled   DM Type IIuncontrolled -Over the last year patient's hemoglobin A1c has gone from 15.2--> 7.4 showing significant improvement. - CBGs well controlled - no change in tx plan today   HLD -Continue Lipitor   Acute on chronic kidney failure stage III (baseline Cr 1.29-3.22) -Cr now within patient's normal range - follow   Peripheral  neuropathy -Currently not complaining of symptoms  Depression -Continue Cymbalta 30 mg daily  Gout - Right knee pain  -9/14 Uric acid 14.8 -Currently not complaining of pain - knee w/o calor or erythema   Hypokalemia -replace and follow - check Mg  Wounds  -Sacral and lower extremity -Wound Care RN assistance requested  Tobacco Abuse  -Patient states stopped smoking ~2 months ago;  encouraged to continue to not smoke  Code Status: FULL Family Communication: Spoke with wife at bedside Disposition Plan: SDU - possible d/c home 24-48hrs   Consultants: Heart failure team  Procedures: None  Antibiotics: None  DVT prophylaxis: SCDs  Objective: Blood pressure 124/76, pulse 98, temperature 98 F (36.7 C), temperature source Oral, resp. rate 16, height  (1.727 m), weight 82.1 kg (181 lb), SpO2 99 %.  Intake/Output Summary (Last 24 hours) at 03/24/15 1706 Last data filed at 03/24/15 1143  Gross per 24 hour  Intake    240 ml  Output   1056 ml  Net   -816 ml   Exam: General: No acute respiratory distress - alert and conversant  Lungs: Clear to auscultation bilaterally  Cardiovascular: Regular rate and rhythm without murmur gallop or rub normal S1 and S2 Abdomen: Nontender, nondistended, soft, bowel sounds positive, no rebound, no ascites, no appreciable mass Extremities: No significant cyanosis, clubbing, edema bilateral lower extremities  Data Reviewed: Basic Metabolic Panel:  Recent Labs Lab 03/21/15 1207 03/22/15 0530 03/23/15 0420 03/24/15 0311  NA 131* 132* 136 137  K 3.6 3.6 3.3* 3.6  CL 86* 87* 91* 91*  CO2  --  34* 33* 36*  GLUCOSE 196*  125* 204* 172*  BUN 54* 57* 47* 44*  CREATININE 2.20* 2.21* 1.71* 1.54*  CALCIUM  --  9.2 9.0 8.9    CBC:  Recent Labs Lab 03/21/15 1200 03/21/15 1207 03/22/15 0530 03/24/15 0311  WBC 9.8  --  8.7 8.5  NEUTROABS 7.4  --   --   --   HGB 9.6* 12.6* 9.1* 9.0*  HCT 29.9* 37.0* 28.2* 29.3*  MCV 68.1*   --  67.0* 68.9*  PLT 312  --  311 271    Liver Function Tests:  Recent Labs Lab 03/21/15 1906 03/24/15 0311  AST 37 22  ALT 18 16*  ALKPHOS 352* 331*  BILITOT 2.6* 2.0*  PROT 7.6 6.8  ALBUMIN 2.6* 2.3*    Recent Labs Lab 03/21/15 1620 03/24/15 0430  AMMONIA 31 27   CBG:  Recent Labs Lab 03/23/15 1724 03/23/15 2143 03/24/15 0735 03/24/15 1141 03/24/15 1552  GLUCAP 182* 161* 128* 139* 155*    Recent Results (from the past 240 hour(s))  Culture, blood (routine x 2)     Status: None (Preliminary result)   Collection Time: 03/21/15  5:44 PM  Result Value Ref Range Status   Specimen Description BLOOD LEFT ARM  Final   Special Requests BOTTLES DRAWN AEROBIC AND ANAEROBIC 10CC  Final   Culture NO GROWTH 3 DAYS  Final   Report Status PENDING  Incomplete  Urine culture     Status: None   Collection Time: 03/21/15  6:33 PM  Result Value Ref Range Status   Specimen Description URINE, CLEAN CATCH  Final   Special Requests NONE  Final   Culture MULTIPLE SPECIES PRESENT, SUGGEST RECOLLECTION  Final   Report Status 03/23/2015 FINAL  Final  Culture, blood (routine x 2)     Status: None (Preliminary result)   Collection Time: 03/21/15  7:40 PM  Result Value Ref Range Status   Specimen Description BLOOD LEFT HAND  Final   Special Requests IN PEDIATRIC BOTTLE 2CC  Final   Culture NO GROWTH 3 DAYS  Final   Report Status PENDING  Incomplete     Studies:   Recent x-ray studies have been reviewed in detail by the Attending Physician  Scheduled Meds:  Scheduled Meds: . atorvastatin  40 mg Oral q1800  . buPROPion  150 mg Oral BID  . clopidogrel  75 mg Oral Daily  . DULoxetine  30 mg Oral Daily  . feeding supplement (GLUCERNA SHAKE)  237 mL Oral TID BM  . heparin  5,000 Units Subcutaneous 3 times per day  . hydrALAZINE  25 mg Oral 3 times per day  . insulin aspart  0-9 Units Subcutaneous TID WC  . insulin glargine  16 Units Subcutaneous BID  . isosorbide mononitrate   30 mg Oral Daily  . lipase/protease/amylase  3 capsule Oral TID AC  . magnesium oxide  400 mg Oral BID  . multivitamin with minerals  1 tablet Oral Daily  . olopatadine  1 drop Both Eyes BID  . pantoprazole  40 mg Oral Daily  . potassium chloride  40 mEq Oral BID  . torsemide  20 mg Oral BID  . cyanocobalamin  1,000 mcg Oral Daily    Time spent on care of this patient: 35 mins   Rechel Delosreyes T , MD   [facility] Office  [phone] Pager - Text Page per Loretha Stapler as per below:  On-Call/Text Page:      [email or web address]      password [email or web address]  If  7PM-7AM, please contact night-coverage [email or web address] Password [email or web address] 03/24/2015, 5:06 PM   LOS: 3 days

## 2015-03-24 NOTE — Progress Notes (Signed)
Daily Progress Note   Patient Name: Christian Wall       Date: 03/24/2015 DOB: 1960/11/20  Age: 54 y.o. MRN#: [id number] Attending Physician: Cherene Altes, MD Primary Care Physician: Philis Fendt, MD Admit Date: 03/21/2015  Reason for Consultation/Follow-up: Establishing goals of care  Subjective: Christian Wall is a 54 year old male with history of chronic systolic heart failure on milrinone, polysubstance abuse, diabetes, peripheral neuropathy and 5 hospitalizations in the last 6 months. He is admitted with altered mental status. Palliative was consulted for goals of care. Interval Events: I met with Christian Wall and his wife today. He is still intermittently confused.  He appears sleepier today than yesterday and his wife reports she wants to wait until his mental status is back close to his baseline prior to making any other further decisions regarding long-term goals of care.  Length of Stay: 3 days  Current Medications: Scheduled Meds:  . atorvastatin  40 mg Oral q1800  . buPROPion  150 mg Oral BID  . clopidogrel  75 mg Oral Daily  . DULoxetine  30 mg Oral Daily  . feeding supplement (GLUCERNA SHAKE)  237 mL Oral TID BM  . heparin  5,000 Units Subcutaneous 3 times per day  . hydrALAZINE  25 mg Oral 3 times per day  . insulin aspart  0-9 Units Subcutaneous TID WC  . insulin glargine  16 Units Subcutaneous BID  . isosorbide mononitrate  30 mg Oral Daily  . lipase/protease/amylase  3 capsule Oral TID AC  . magnesium oxide  400 mg Oral BID  . multivitamin with minerals  1 tablet Oral Daily  . olopatadine  1 drop Both Eyes BID  . pantoprazole  40 mg Oral Daily  . potassium chloride  40 mEq Oral BID  . torsemide  20 mg Oral BID  . cyanocobalamin  1,000 mcg Oral Daily    Continuous Infusions: . milrinone 0.375 mcg/kg/min (03/24/15 1506)    PRN Meds: acetaminophen **OR** acetaminophen, albuterol, MUSCLE RUB, ondansetron **OR** ondansetron (ZOFRAN) IV,  senna-docusate  Palliative Performance Scale: 40%     Vital Signs: BP 124/76 mmHg  Pulse 98  Temp(Src) 98 F (36.7 C) (Oral)  Resp 16  Ht 5' 8" (1.727 m)  Wt 82.1 kg (181 lb)  BMI 27.53 kg/m2  SpO2 99% SpO2: SpO2: 99 % O2 Device: O2 Device: Nasal Cannula O2 Flow Rate: O2 Flow Rate (L/min): 2 L/min  Intake/output summary:   Intake/Output Summary (Last 24 hours) at 03/24/15 1812 Last data filed at 03/24/15 1143  Gross per 24 hour  Intake    240 ml  Output   1055 ml  Net   -815 ml   LBM:   Baseline Weight: Weight: 85.9 kg (189 lb 6 oz) Most recent weight: Weight: 82.1 kg (181 lb)  Physical Exam: General: Chronically ill appearing. No resp difficulty commands HEENT: normal Neck: supple. JVP to ear . CV: Regular rate & rhythm. No rubs, gallops or murmurs. Lungs: clear Abdomen: soft, nontender, nondistended. No bruits or masses. Good bowel sounds. Extremities: no cyanosis, clubbing, rash, R and LLE trace edema. Extremities warm. RUE PICC  Neuro: Drowsy. Moves 4 extremities   Additional Data Reviewed: Recent Labs     03/22/15  0530  03/23/15  0420  03/24/15  0311  WBC  8.7   --   8.5  HGB  9.1*   --   9.0*  PLT  311   --   271  NA  132*  136  137  BUN  57*  47*  44*  CREATININE  2.21*  1.71*  1.54*     Problem List:  Patient Active Problem List   Diagnosis Date Noted  . Confusion   . Goals of care, counseling/discussion   . Pulmonary hypertension   . Systolic murmur   . Right knee pain   . Acute encephalopathy 03/21/2015  . PAD (peripheral artery disease) 03/13/2015  . CKD (chronic kidney disease) stage 3, GFR 30-59 ml/min 03/13/2015  . Pressure ulcer 03/12/2015  . Acute combined systolic and diastolic heart failure, NYHA class 4 03/11/2015  . Shock, cardiogenic 02/23/2015  . Lower limb ischemia 02/23/2015  . Hospital acquired PNA 02/23/2015  . Acute on chronic systolic heart failure 79/08/4095  . Limb ischemia 02/21/2015  . Ischemic neuropathy  of foot 02/21/2015  . Acute on chronic renal failure   . HLD (hyperlipidemia)   . Acute renal failure superimposed on stage 3 chronic kidney disease   . Thrombocytopenia   . Diabetes type 2, uncontrolled   . Depression   . Tobacco abuse   . Acute kidney injury   . Acute on chronic systolic CHF (congestive heart failure)   . Acute renal failure (ARF) 01/30/2015  . AKI (acute kidney injury) 01/30/2015  . Chronic systolic CHF (congestive heart failure) 01/10/2015  . Chronic systolic heart failure 35/32/9924  . Upper airway cough syndrome 12/19/2014  . Cigarette smoker 12/19/2014  . Dyspnea   . Palliative care encounter 11/20/2014  . Weakness 11/20/2014  . Acute renal failure   . CHF (congestive heart failure) 11/13/2014  . Acute systolic CHF (congestive heart failure), NYHA class 3 11/13/2014  . Essential hypertension 11/13/2014  . Hypoglycemia 11/13/2014  . Chronic renal insufficiency 11/13/2014  . Diabetes mellitus type II, uncontrolled 11/13/2014  . Peripheral neuropathy 11/13/2014  . Dehydration 11/10/2013  . Non-adherence to medical treatment 11/10/2013  . Poor venous access 11/10/2013  . Anemia 11/10/2013  . Abnormal EKG 11/10/2013  . Acute Metabolic encephalopathy 26/83/4196  . Alcoholism 11/10/2013  . Esophageal thickening 11/10/2013  . Gastric wall thickening 11/10/2013  . Left Renal lesion 11/10/2013  . DKA (diabetic ketoacidoses) 11/09/2013  . Hyperosmolar non-ketotic state in patient with type 2 diabetes mellitus 11/09/2013  . ARF (acute renal failure) 11/09/2013  . Chest pain 11/09/2013  . Herniation of cervical intervertebral disc with radiculopathy 04/13/2012     Palliative Care Assessment & Plan    Code Status:  Full code  Goals of Care:  I met with the patient is wife today. He seems much more clear mentally, but she reports still not back to his baseline. He is sleepier today than yesterday. She reports that she does not want to continue  conversations regarding goals of care moving forward until it appears that he is back to his normal baseline.  I did discuss a MOST form again with them and showed her the form. If his mental status improved to the point he is able, I think it will be worthwhile to complete this during this hospitalization.  I also asked if additional information will be helpful to her. She reports that it would, and I provided a copy of [facility] for [place] for her review.  Symptom Management:  Confusion: Likely multifactorial with unclear specific etiology. This does seem to be improving. Management per primary.   Psycho-social/Spiritual:  Desire for further Chaplaincy support:no   Prognosis: Unable to determine however with his advanced heart failure milrinone dependence he would  likely qualify for hospice support if desired Discharge Planning: To be determined   Care plan was discussed with patient and his wife  Thank you for allowing the Palliative Medicine Team to assist in the care of this patient.   Time In: 1600 Time Out: 1615 Total Time 20 Prolonged Time Billed  no     Greater than 50%  of this time was spent counseling and coordinating care related to the above assessment and plan.   Micheline Rough, MD  03/24/2015, 6:12 PM  Please contact Palliative Medicine Team phone at [phone] for questions and concerns.

## 2015-03-24 NOTE — Progress Notes (Addendum)
       Patient Name: Christian Wall Date of Encounter: 03/24/2015    SUBJECTIVE:Says he feels better and appetite is improving.  TELEMETRY:  Sinus tach Filed Vitals:   03/24/15 0034 03/24/15 0454 03/24/15 0736 03/24/15 1142  BP: 120/76 120/73 124/76 124/76  Pulse: 100 95 96 98  Temp: 97.9 F (36.6 C) 97.8 F (36.6 C) 97.8 F (36.6 C) 97.8 F (36.6 C)  TempSrc: Oral Oral Oral Oral  Resp: 20 18 17 16   Height:  5\' 8"  (1.727 m)    Weight:  82.1 kg (181 lb)    SpO2: 97% 98% 98% 99%    Intake/Output Summary (Last 24 hours) at 03/24/15 1349 Last data filed at 03/24/15 1143  Gross per 24 hour  Intake    240 ml  Output   1306 ml  Net  -1066 ml   LABS: Basic Metabolic Panel:  Recent Labs  23/55/73 0420 03/24/15 0311  NA 136 137  K 3.3* 3.6  CL 91* 91*  CO2 33* 36*  GLUCOSE 204* 172*  BUN 47* 44*  CREATININE 1.71* 1.54*  CALCIUM 9.0 8.9   CBC:  Recent Labs  03/22/15 0530 03/24/15 0311  WBC 8.7 8.5  HGB 9.1* 9.0*  HCT 28.2* 29.3*  MCV 67.0* 68.9*  PLT 311 271   Radiology/Studies:  No new data  Physical Exam: Blood pressure 124/76, pulse 98, temperature 97.8 F (36.6 C), temperature source Oral, resp. rate 16, height 5\' 8"  (1.727 m), weight 82.1 kg (181 lb), SpO2 99 %. Weight change: -0.6 kg (-1 lb 5.2 oz)  Wt Readings from Last 3 Encounters:  03/24/15 82.1 kg (181 lb)  03/13/15 85 kg (187 lb 6.3 oz)  03/08/15 87.272 kg (192 lb 6.4 oz)   S3 gallop. Apical systolic murmur of MR No edema In a "fog" from alertness standpoint and not fully coherent.   ASSESSMENT:  1. Chronic systolic heart failure, with mild volume overload, improving. 2. Altered mental status improved by all accounts compared to admission,.  Plan:  Continue current regimen. Let use know if further questions or concerns. Major issue is mental status.  Selinda Eon 03/24/2015, 1:49 PM

## 2015-03-25 DIAGNOSIS — N183 Chronic kidney disease, stage 3 (moderate): Secondary | ICD-10-CM

## 2015-03-25 LAB — GLUCOSE, CAPILLARY
GLUCOSE-CAPILLARY: 137 mg/dL — AB (ref 65–99)
GLUCOSE-CAPILLARY: 169 mg/dL — AB (ref 65–99)
GLUCOSE-CAPILLARY: 140 mg/dL — AB (ref 65–99)
GLUCOSE-CAPILLARY: 150 mg/dL — AB (ref 65–99)

## 2015-03-25 LAB — BASIC METABOLIC PANEL
Anion gap: 12 (ref 5–15)
BUN: 39 mg/dL — AB (ref 6–20)
CO2: 34 mmol/L — ABNORMAL HIGH (ref 22–32)
CREATININE: 1.69 mg/dL — AB (ref 0.61–1.24)
Calcium: 8.8 mg/dL — ABNORMAL LOW (ref 8.9–10.3)
Chloride: 91 mmol/L — ABNORMAL LOW (ref 101–111)
GFR, EST AFRICAN AMERICAN: 52 mL/min — AB (ref >60)
GFR, EST NON AFRICAN AMERICAN: 45 mL/min — AB (ref >60)
Glucose, Bld: 162 mg/dL — ABNORMAL HIGH (ref 65–99)
POTASSIUM: 3.7 mmol/L (ref 3.5–5.1)
SODIUM: 137 mmol/L (ref 135–145)

## 2015-03-25 LAB — RPR: RPR Ser Ql: NONREACTIVE

## 2015-03-25 MED ORDER — HALOPERIDOL LACTATE 5 MG/ML IJ SOLN: 2.0000 mg | Freq: Once | INTRAMUSCULAR | Status: DC

## 2015-03-25 NOTE — Progress Notes (Signed)
       Patient Name: Christian Wall Date of Encounter: 03/25/2015    SUBJECTIVE: No cardiac complaints  TELEMETRY:  Sinus tachycardia but improved compared with yesterday Filed Vitals:   03/24/15 2315 03/25/15 0304 03/25/15 0328 03/25/15 0722  BP: 127/76 129/75    Pulse: 103 100    Temp: 98.2 F (36.8 C) 98 F (36.7 C)  98 F (36.7 C)  TempSrc: Oral Oral  Oral  Resp: 12 17    Height:   5\' 8"  (1.727 m)   Weight:   81 kg (178 lb 9.2 oz)   SpO2: 96% 90%      Intake/Output Summary (Last 24 hours) at 03/25/15 1227 Last data filed at 03/25/15 0454  Gross per 24 hour  Intake    360 ml  Output    550 ml  Net   -190 ml   LABS: Basic Metabolic Panel:  Recent Labs  44/81/85 0420 03/24/15 0311  NA 136 137  K 3.3* 3.6  CL 91* 91*  CO2 33* 36*  GLUCOSE 204* 172*  BUN 47* 44*  CREATININE 1.71* 1.54*  CALCIUM 9.0 8.9   CBC:  Recent Labs  03/24/15 0311  WBC 8.5  HGB 9.0*  HCT 29.3*  MCV 68.9*  PLT 271    Radiology/Studies:  None  Physical Exam: Blood pressure 129/75, pulse 100, temperature 98 F (36.7 C), temperature source Oral, resp. rate 17, height 5\' 8"  (1.727 m), weight 81 kg (178 lb 9.2 oz), SpO2 90 %. Weight change: -1.1 kg (-2 lb 6.8 oz)  Wt Readings from Last 3 Encounters:  03/25/15 81 kg (178 lb 9.2 oz)  03/13/15 85 kg (187 lb 6.3 oz)  03/08/15 87.272 kg (192 lb 6.4 oz)   Tachycardia Clear lung fields No edema  ASSESSMENT:  1. Chronic right and left heart failure. Net negative since admission by nearly 3 L.  2. Sinus tachycardia improved.  Plan:   No change in current cardiac therapy.  Selinda Eon 03/25/2015, 12:27 PM

## 2015-03-25 NOTE — Progress Notes (Signed)
Daily Progress Note   Patient Name: Christian Wall       Date: 03/25/2015 DOB: 1961/03/18  Age: 54 y.o. MRN#: [id number] Attending Physician: Cherene Altes, MD Primary Care Physician: Philis Fendt, MD Admit Date: 03/21/2015  Reason for Consultation/Follow-up: Establishing goals of care  Subjective: Christian Wall is a 54 year old male with history of chronic systolic heart failure on milrinone, polysubstance abuse, diabetes, peripheral neuropathy and 5 hospitalizations in the last 6 months. He is admitted with altered mental status. Palliative was consulted for goals of care. Interval Events: I met with Christian Wall today. He is still intermittently confused. He reports no concerns today and is not sure when his wife will be in to visit.   Length of Stay: 4 days  Current Medications: Scheduled Meds:  . atorvastatin  40 mg Oral q1800  . buPROPion  150 mg Oral BID  . clopidogrel  75 mg Oral Daily  . DULoxetine  30 mg Oral Daily  . feeding supplement (GLUCERNA SHAKE)  237 mL Oral TID BM  . haloperidol lactate  2 mg Intravenous Once  . heparin  5,000 Units Subcutaneous 3 times per day  . hydrALAZINE  25 mg Oral 3 times per day  . insulin aspart  0-9 Units Subcutaneous TID WC  . insulin glargine  16 Units Subcutaneous BID  . isosorbide mononitrate  30 mg Oral Daily  . lipase/protease/amylase  3 capsule Oral TID AC  . magnesium oxide  400 mg Oral BID  . multivitamin with minerals  1 tablet Oral Daily  . olopatadine  1 drop Both Eyes BID  . pantoprazole  40 mg Oral Daily  . potassium chloride  40 mEq Oral BID  . torsemide  20 mg Oral BID  . cyanocobalamin  1,000 mcg Oral Daily    Continuous Infusions: . milrinone 0.375 mcg/kg/min (03/25/15 0454)    PRN Meds: acetaminophen **OR** acetaminophen, albuterol, MUSCLE RUB, ondansetron **OR** ondansetron (ZOFRAN) IV, senna-docusate  Palliative Performance Scale: 40%     Vital Signs: BP 129/75 mmHg  Pulse 100  Temp(Src) 98  F (36.7 C) (Oral)  Resp 17  Ht 5' 8"  (1.727 m)  Wt 81 kg (178 lb 9.2 oz)  BMI 27.16 kg/m2  SpO2 90% SpO2: SpO2: 90 % O2 Device: O2 Device: Nasal Cannula O2 Flow Rate: O2 Flow Rate (L/min): 2 L/min  Intake/output summary:   Intake/Output Summary (Last 24 hours) at 03/25/15 1123 Last data filed at 03/25/15 0454  Gross per 24 hour  Intake    360 ml  Output    725 ml  Net   -365 ml   LBM:   Baseline Weight: Weight: 85.9 kg (189 lb 6 oz) Most recent weight: Weight: 81 kg (178 lb 9.2 oz)  Physical Exam: General: Chronically ill appearing. No resp difficulty commands HEENT: normal Neck: supple. JVP to ear . CV: Regular rate & rhythm. No rubs, gallops or murmurs. Lungs: clear Abdomen: soft, nontender, nondistended. No bruits or masses. Good bowel sounds. Extremities: no cyanosis, clubbing, rash, R and LLE trace edema. Extremities warm. RUE PICC  Neuro: Drowsy. Moves 4 extremities   Additional Data Reviewed: Recent Labs     03/23/15  0420  03/24/15  0311  WBC   --   8.5  HGB   --   9.0*  PLT   --   271  NA  136  137  BUN  47*  44*  CREATININE  1.71*  1.54*     Problem List:  Patient Active Problem List   Diagnosis Date Noted  . Confusion   . Goals of care, counseling/discussion   . Pulmonary hypertension   . Systolic murmur   . Right knee pain   . Acute encephalopathy 03/21/2015  . PAD (peripheral artery disease) 03/13/2015  . CKD (chronic kidney disease) stage 3, GFR 30-59 ml/min 03/13/2015  . Pressure ulcer 03/12/2015  . Acute combined systolic and diastolic heart failure, NYHA class 4 03/11/2015  . Shock, cardiogenic 02/23/2015  . Lower limb ischemia 02/23/2015  . Hospital acquired PNA 02/23/2015  . Acute on chronic systolic heart failure 86/57/8469  . Limb ischemia 02/21/2015  . Ischemic neuropathy of foot 02/21/2015  . Acute on chronic renal failure   . HLD (hyperlipidemia)   . Acute renal failure superimposed on stage 3 chronic kidney disease     . Thrombocytopenia   . Diabetes type 2, uncontrolled   . Depression   . Tobacco abuse   . Acute kidney injury   . Acute on chronic systolic CHF (congestive heart failure)   . Acute renal failure (ARF) 01/30/2015  . AKI (acute kidney injury) 01/30/2015  . Chronic systolic CHF (congestive heart failure) 01/10/2015  . Chronic systolic heart failure 62/95/2841  . Upper airway cough syndrome 12/19/2014  . Cigarette smoker 12/19/2014  . Dyspnea   . Palliative care encounter 11/20/2014  . Weakness 11/20/2014  . Acute renal failure   . CHF (congestive heart failure) 11/13/2014  . Acute systolic CHF (congestive heart failure), NYHA class 3 11/13/2014  . Essential hypertension 11/13/2014  . Hypoglycemia 11/13/2014  . Chronic renal insufficiency 11/13/2014  . Diabetes mellitus type II, uncontrolled 11/13/2014  . Peripheral neuropathy 11/13/2014  . Dehydration 11/10/2013  . Non-adherence to medical treatment 11/10/2013  . Poor venous access 11/10/2013  . Anemia 11/10/2013  . Abnormal EKG 11/10/2013  . Acute Metabolic encephalopathy 32/44/0102  . Alcoholism 11/10/2013  . Esophageal thickening 11/10/2013  . Gastric wall thickening 11/10/2013  . Left Renal lesion 11/10/2013  . DKA (diabetic ketoacidoses) 11/09/2013  . Hyperosmolar non-ketotic state in patient with type 2 diabetes mellitus 11/09/2013  . ARF (acute renal failure) 11/09/2013  . Chest pain 11/09/2013  . Herniation of cervical intervertebral disc with radiculopathy 04/13/2012     Palliative Care Assessment & Plan    Code Status:  Full code  Goals of Care:  I been discussing goals of care with Christian Wall and his wife daily basis. She is not in the room for my encounter today. She feels as though he has not really been clearheaded enough to appreciate the conversation and participate fully. She has expressed to me that she wants to wait until his mental status improves prior to making any other decisions moving  forward long-term. I did speak with her yesterday that he may never get back to the point he was at prior to this hospitalization.  I met with the patient again today. Nurse reports that he is more confused again this morning.  His wife was not present today during the encounter today. I will try to visit again later this afternoon to see if she is here. Up to this point in time I have provided a lot of information to his wife including recommendation to complete a MOST form and providing a copy of [facility] for [place] for her review.  Overall, I am not sure if they are emotionally ready to continue discussion about goals of care and his limited prognosis moving forward at  this time. I have tried to approach the topic from multiple angles with no significant progress.   I recommended to his wife yesterday that regardless of time frame of this hospitalization, he should continue to have conversations about his clinical course moving forward with both his primary care physician and his cardiologists on follow-up. I'm not sure that they have really processed prognostic information they've been given to this point in time.  I will follow-up with Christian Wall again when his wife is present either later today or tomorrow.  Symptom Management:  Confusion: Likely multifactorial with unclear specific etiology. This does seem to be improving overall. Management per primary.   Psycho-social/Spiritual:  Desire for further Chaplaincy support:no   Prognosis: Unable to determine however with his advanced heart failure milrinone dependence he would likely qualify for hospice support if desired Discharge Planning: To be determined. Likely home with home health..    Care plan was discussed with patient and his wife  Thank you for allowing the Palliative Medicine Team to assist in the care of this patient.   Time In: 1100 Time Out: 1115 Total Time 15 Prolonged Time Billed  no     Greater  than 50%  of this time was spent counseling and coordinating care related to the above assessment and plan.   Micheline Rough, MD  03/25/2015, 11:23 AM  Please contact Palliative Medicine Team phone at [phone] for questions and concerns.

## 2015-03-25 NOTE — Progress Notes (Signed)
[facility] TEAM 1 - Stepdown/ICU TEAM PROGRESS NOTE  Tytus Strahle ZOX:[id number] DOB: 02/13/1961 DOA: 03/21/2015 PCP: Dorrene German, MD  Admit HPI / Brief Narrative: 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 He was discharged from the cardiology service 03/13/2015 after being treated for acute on chronic systolic heart failure. His wife said he was doing fairly well until he began to become confused.   In the emergency department his WBC was normal, BNP was 824, creatinine 2.2 (elevated from previous but still within his baseline), BUN slightly elevated. Chest x-ray showed bilateral airspace disease-pulmonary edema versus infection.  HPI/Subjective: The patient is alert and interactive though his wife states he is not quite back to his baseline.  He did have a brief episode of agitation this morning but was able to be redirected by the nursing staff without pharmacologic therapy.  He denies any complaints at the present time and states he is anxious to be discharged home.  Assessment/Plan:  Acute encephalopathy -UDS negative -B12 normal/high - folate normal - TSH normal  -Head CT nondiagnostic -Possibly iatrogenic:  patient on OxyIR, Lyrica, Zanaflex, and tramadol at home - holding all sedating medications  -?hepatic encephalopathy - doubt given low ammonia - ammonia level stable therefore will not start lactulose   Chronic Systolic CHF (NICM), NYHA III  -care per CHF Team  Essential HTN -BP well controlled   DM Type II -Over the last year patient's hemoglobin A1c has gone from 15.2--> 7.4 showing significant improvement. - CBGs well controlled   HLD -Continue Lipitor   Acute on chronic kidney failure stage III (baseline Cr 1.29-3.22) -Cr now within patient's normal range - follow    Peripheral neuropathy -Currently not complaining of symptoms  Depression -Continue Cymbalta 30 mg daily  Gout - Right knee pain  -9/14 Uric acid 14.8 -Currently not complaining of pain - knee w/o calor or erythema   Hypokalemia -f/u in AM  Wounds  -Sacral and lower extremity -Wound Care RN assistance requested  Tobacco Abuse  -Patient states stopped smoking ~2 months ago;  encouraged to continue to not smoke  Code Status: FULL Family Communication: Spoke with wife at bedside Disposition Plan: SDU - probable d/c home 9/19   Consultants: Heart failure team Palliative Care   Procedures: None  Antibiotics: None  DVT prophylaxis: SCDs  Objective: Blood pressure 119/72, pulse 101, temperature 98.2 F (36.8 C), temperature source Oral, resp. rate 22, height  (1.727 m), weight 81 kg (178 lb 9.2 oz), SpO2 93 %.  Intake/Output Summary (Last 24 hours) at 03/25/15 1729 Last data filed at 03/25/15 1400  Gross per 24 hour  Intake  556.8 ml  Output    550 ml  Net    6.8 ml   Exam: General: No acute respiratory distress - alert / conversant  Lungs: Clear to auscultation bilaterally - no wheeze or crackles  Cardiovascular: Regular rate and rhythm without murmur gallop or rub normal S1 and S2 Abdomen: Nontender, nondistended, soft, bowel sounds positive, no rebound, no ascites, no appreciable mass Extremities: No significant cyanosis, clubbing, or edema bilateral lower extremities  Data Reviewed: Basic Metabolic Panel:  Recent Labs Lab 03/21/15 1207 03/22/15 0530 03/23/15 0420 03/24/15 0311  NA 131* 132* 136 137  K 3.6 3.6 3.3* 3.6  CL 86* 87* 91* 91*  CO2  --  34* 33* 36*  GLUCOSE 196* 125* 204* 172*  BUN 54* 57* 47* 44*  CREATININE 2.20* 2.21* 1.71* 1.54*  CALCIUM  --  9.2 9.0 8.9    CBC:  Recent Labs Lab 03/21/15 1200 03/21/15 1207 03/22/15 0530 03/24/15 0311  WBC 9.8  --  8.7 8.5  NEUTROABS 7.4  --   --   --   HGB 9.6* 12.6* 9.1* 9.0*   HCT 29.9* 37.0* 28.2* 29.3*  MCV 68.1*  --  67.0* 68.9*  PLT 312  --  311 271    Liver Function Tests:  Recent Labs Lab 03/21/15 1906 03/24/15 0311  AST 37 22  ALT 18 16*  ALKPHOS 352* 331*  BILITOT 2.6* 2.0*  PROT 7.6 6.8  ALBUMIN 2.6* 2.3*    Recent Labs Lab 03/21/15 1620 03/24/15 0430  AMMONIA 31 27   CBG:  Recent Labs Lab 03/24/15 1552 03/24/15 2145 03/25/15 0717 03/25/15 1115 03/25/15 1603  GLUCAP 155* 152* 137* 169* 140*    Recent Results (from the past 240 hour(s))  Culture, blood (routine x 2)     Status: None (Preliminary result)   Collection Time: 03/21/15  5:44 PM  Result Value Ref Range Status   Specimen Description BLOOD LEFT ARM  Final   Special Requests BOTTLES DRAWN AEROBIC AND ANAEROBIC 10CC  Final   Culture NO GROWTH 4 DAYS  Final   Report Status PENDING  Incomplete  Urine culture     Status: None   Collection Time: 03/21/15  6:33 PM  Result Value Ref Range Status   Specimen Description URINE, CLEAN CATCH  Final   Special Requests NONE  Final   Culture MULTIPLE SPECIES PRESENT, SUGGEST RECOLLECTION  Final   Report Status 03/23/2015 FINAL  Final  Culture, blood (routine x 2)     Status: None (Preliminary result)   Collection Time: 03/21/15  7:40 PM  Result Value Ref Range Status   Specimen Description BLOOD LEFT HAND  Final   Special Requests IN PEDIATRIC BOTTLE 2CC  Final   Culture NO GROWTH 4 DAYS  Final   Report Status PENDING  Incomplete     Studies:   Recent x-ray studies have been reviewed in detail by the Attending Physician  Scheduled Meds:  Scheduled Meds: . atorvastatin  40 mg Oral q1800  . buPROPion  150 mg Oral BID  . clopidogrel  75 mg Oral Daily  . DULoxetine  30 mg Oral Daily  . feeding supplement (GLUCERNA SHAKE)  237 mL Oral TID BM  . haloperidol lactate  2 mg Intravenous Once  . heparin  5,000 Units Subcutaneous 3 times per day  . hydrALAZINE  25 mg Oral 3 times per day  . insulin aspart  0-9 Units  Subcutaneous TID WC  . insulin glargine  16 Units Subcutaneous BID  . isosorbide mononitrate  30 mg Oral Daily  . lipase/protease/amylase  3 capsule Oral TID AC  . magnesium oxide  400 mg Oral BID  . multivitamin with minerals  1 tablet Oral Daily  . olopatadine  1 drop Both Eyes BID  . pantoprazole  40 mg Oral Daily  . potassium chloride  40 mEq Oral BID  . torsemide  20 mg Oral BID  . cyanocobalamin  1,000 mcg Oral Daily    Time spent on care of this patient: 25 mins   [facility] T , MD   [facility] Office  [phone] Pager - Text Page per Loretha Stapler as per  below:  On-Call/Text Page:      [email or web address]      password [email or web address]  If 7PM-7AM, please contact night-coverage [email or web address] Password [email or web address] 03/25/2015, 5:29 PM   LOS: 4 days

## 2015-03-26 ENCOUNTER — Telehealth (HOSPITAL_COMMUNITY): Payer: Self-pay | Admitting: *Deleted

## 2015-03-26 LAB — GLUCOSE, CAPILLARY
GLUCOSE-CAPILLARY: 147 mg/dL — AB (ref 65–99)
GLUCOSE-CAPILLARY: 153 mg/dL — AB (ref 65–99)
GLUCOSE-CAPILLARY: 96 mg/dL (ref 65–99)
Glucose-Capillary: 82 mg/dL (ref 65–99)

## 2015-03-26 LAB — CULTURE, BLOOD (ROUTINE X 2)
Culture: NO GROWTH
Culture: NO GROWTH

## 2015-03-26 MED ORDER — INSULIN GLARGINE 100 UNIT/ML ~~LOC~~ SOLN
12.0000 [IU] | Freq: Once | SUBCUTANEOUS | Status: AC
  Administered 2015-03-27: 12 [IU] via SUBCUTANEOUS
  Filled 2015-03-26: qty 0.12

## 2015-03-26 NOTE — Progress Notes (Signed)
[facility] TEAM 1 - Stepdown/ICU TEAM PROGRESS NOTE  Christian Wall WJX:[id number] DOB: 09-26-1960 DOA: 03/21/2015 PCP: Philis Fendt, MD  Admit HPI / Brief Narrative: 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 He was discharged from the cardiology service 03/13/2015 after being treated for acute on chronic systolic heart failure. His wife said he was doing fairly well until he began to become confused.   In the emergency department his WBC was normal, BNP was 824, creatinine 2.2 (elevated from previous but still within his baseline), BUN slightly elevated. Chest x-ray showed bilateral airspace disease-pulmonary edema versus infection.  HPI/Subjective: The patient is alert and conversant.  He denies any complaints as he has throughout his hospital stay.  He appears to still be somewhat confused.  The original plan was to discharge him home today with maximal home health therapy.  After having physical and occupational therapy reevaluate him today however it became clear that he was not safe for discharge home.  The patient remains quite weak and had great difficulty even moving from his bed to a chair.  When he attempted to do so his blood pressure also dropped significantly with a systolic of 80 standing at bedside, though the patient did not feel presyncopal.  I then had a lengthy discussion with the patient and his wife and have advised that he would be safest in a skilled nursing facility for a prolonged rehabilitation stay.  His wife fully supports this issue and the patient was willing to give it a try.  We therefore decided to transfer the patient to the CHF floor while we investigate options regarding SNF rehabilitation placement.  Of note palliative care has met with the patient and his wife on  multiple occasions but they have been a thus far unwilling to consider transitioning towards comfort directed mode.  Assessment/Plan:  Acute encephalopathy -UDS negative -B12 normal/high - folate normal - TSH normal  -Head CT nondiagnostic -Possibly iatrogenic:  patient on OxyIR, Lyrica, Zanaflex, and tramadol at home - holding all sedating medications  -?hepatic encephalopathy - doubt given low ammonia - ammonia level stable therefore will not start lactulose  -I have advised the patient's wife that his current mental status may be his new baseline and that this could be related to his general weakened health and low cardiac output  Chronic Systolic CHF (NICM), NYHA III  -care per CHF Team - they have made no changes in his treatment today and feel that he is stable from their standpoint  Essential HTN -BP well controlled   DM Type II -Over the last year patient's hemoglobin A1c has gone from 15.2--> 7.4 showing significant improvement. - CBGs well controlled   HLD -Continue Lipitor   Acute on chronic kidney failure stage III (baseline Cr 1.29-3.22) -Cr now within patient's normal range   Peripheral neuropathy -Currently not complaining of symptoms  Depression -Continue Cymbalta 30 mg daily  Gout - Right knee pain  -9/14 Uric acid 14.8 -Currently not complaining of pain - knee w/o calor or erythema   Hypokalemia -Corrected  Wounds  -Sacral and lower extremity -Wound Care RN assistance requested  Tobacco Abuse  -Patient states stopped smoking ~2 months ago;  encouraged to continue to not smoke  Code Status: FULL Family Communication: Spoke with wife at bedside at length as detailed  above Disposition Plan: Transfer to CHF floor for ongoing milrinone therapy - begin SNF search for rehabilitation stay  Consultants: Heart failure team Palliative Care   Procedures: None  Antibiotics: None  DVT prophylaxis: SCDs  Objective: Blood pressure 114/76, pulse  99, temperature 97.7 F (36.5 C), temperature source Oral, resp. rate 20, height _0  (1.702 m), weight 80.8 kg (178 lb 2.1 oz), SpO2 96 %.  Intake/Output Summary (Last 24 hours) at 03/26/15 1800 Last data filed at 03/26/15 1600  Gross per 24 hour  Intake 1768.2 ml  Output   1100 ml  Net  668.2 ml   Exam: General: No acute respiratory distress - alert / conversant though subtly confused Lungs: Clear to auscultation bilaterally - no crackles  Cardiovascular: Regular rate and rhythm without murmur gallop or rub Abdomen: Nontender, nondistended, soft, bowel sounds positive, no rebound, no ascites, no appreciable mass Extremities: No significant cyanosis, clubbing, or edema bilateral lower extremities  Data Reviewed: Basic Metabolic Panel:  Recent Labs Lab 03/21/15 1207 03/22/15 0530 03/23/15 0420 03/24/15 0311 03/25/15 2307  NA 131* 132* 136 137 137  K 3.6 3.6 3.3* 3.6 3.7  CL 86* 87* 91* 91* 91*  CO2  --  34* 33* 36* 34*  GLUCOSE 196* 125* 204* 172* 162*  BUN 54* 57* 47* 44* 39*  CREATININE 2.20* 2.21* 1.71* 1.54* 1.69*  CALCIUM  --  9.2 9.0 8.9 8.8*    CBC:  Recent Labs Lab 03/21/15 1200 03/21/15 1207 03/22/15 0530 03/24/15 0311  WBC 9.8  --  8.7 8.5  NEUTROABS 7.4  --   --   --   HGB 9.6* 12.6* 9.1* 9.0*  HCT 29.9* 37.0* 28.2* 29.3*  MCV 68.1*  --  67.0* 68.9*  PLT 312  --  311 271    Liver Function Tests:  Recent Labs Lab 03/21/15 1906 03/24/15 0311  AST 37 22  ALT 18 16*  ALKPHOS 352* 331*  BILITOT 2.6* 2.0*  PROT 7.6 6.8  ALBUMIN 2.6* 2.3*    Recent Labs Lab 03/21/15 1620 03/24/15 0430  AMMONIA 31 27   CBG:  Recent Labs Lab 03/25/15 1603 03/25/15 2112 03/26/15 0811 03/26/15 1154 03/26/15 1711  GLUCAP 140* 150* 147* 153* 96    Recent Results (from the past 240 hour(s))  Culture, blood (routine x 2)     Status: None   Collection Time: 03/21/15  5:44 PM  Result Value Ref Range Status   Specimen Description BLOOD LEFT ARM  Final    Special Requests BOTTLES DRAWN AEROBIC AND ANAEROBIC 10CC  Final   Culture NO GROWTH 5 DAYS  Final   Report Status 03/26/2015 FINAL  Final  Urine culture     Status: None   Collection Time: 03/21/15  6:33 PM  Result Value Ref Range Status   Specimen Description URINE, CLEAN CATCH  Final   Special Requests NONE  Final   Culture MULTIPLE SPECIES PRESENT, SUGGEST RECOLLECTION  Final   Report Status 03/23/2015 FINAL  Final  Culture, blood (routine x 2)     Status: None   Collection Time: 03/21/15  7:40 PM  Result Value Ref Range Status   Specimen Description BLOOD LEFT HAND  Final   Special Requests IN PEDIATRIC BOTTLE 2CC  Final   Culture NO GROWTH 5 DAYS  Final   Report Status 03/26/2015 FINAL  Final     Studies:   Recent x-ray studies have been reviewed in detail by the Attending Physician  Scheduled Meds:  Scheduled Meds: . atorvastatin  40 mg Oral q1800  . buPROPion  150 mg Oral BID  . clopidogrel  75 mg Oral Daily  . DULoxetine  30 mg Oral Daily  . feeding supplement (GLUCERNA SHAKE)  237 mL Oral TID BM  . heparin  5,000 Units Subcutaneous 3 times per day  . hydrALAZINE  25 mg Oral 3 times per day  . insulin aspart  0-9 Units Subcutaneous TID WC  . insulin glargine  16 Units Subcutaneous BID  . isosorbide mononitrate  30 mg Oral Daily  . lipase/protease/amylase  3 capsule Oral TID AC  . magnesium oxide  400 mg Oral BID  . multivitamin with minerals  1 tablet Oral Daily  . olopatadine  1 drop Both Eyes BID  . pantoprazole  40 mg Oral Daily  . potassium chloride  40 mEq Oral BID  . torsemide  20 mg Oral BID  . cyanocobalamin  1,000 mcg Oral Daily    Time spent on care of this patient: 35 mins   MCCLUNG,JEFFREY T , MD   [facility] Office  [phone] Pager - Text Page per Shea Evans as per below:  On-Call/Text Page:      [email or web address]      password [email or web address]  If 7PM-7AM, please contact night-coverage [email or web address] Password [email or web address] 03/26/2015, 6:00 PM   LOS: 5  days

## 2015-03-26 NOTE — Progress Notes (Signed)
Advanced Heart Failure Rounding Note   Subjective:    Admitted with AMS and volume overload. Ammonia on admit 31. CT of head no acute process.   No events over the weekend. He continued on torsemide and milrinone.  Weight down 1 pound.   Remains confused. Denies SOB or orthopnea   UA negative UDS negative  Objective:   Weight Range:  Vital Signs:   Temp:  [97.3 F (36.3 C)-98.3 F (36.8 C)] 97.3 F (36.3 C) (09/19 0813) Pulse Rate:  [97-102] 97 (09/19 0738) Resp:  [15-22] 17 (09/19 0504) BP: (118-130)/(72-85) 118/78 mmHg (09/19 0738) SpO2:  [91 %-98 %] 93 % (09/19 0738) Weight:  [177 lb 4 oz (80.4 kg)] 177 lb 4 oz (80.4 kg) (09/19 0504) Last BM Date: 03/25/15  Weight change: Filed Weights   03/24/15 0454 03/25/15 0328 03/26/15 0504  Weight: 181 lb (82.1 kg) 178 lb 9.2 oz (81 kg) 177 lb 4 oz (80.4 kg)    Intake/Output:   Intake/Output Summary (Last 24 hours) at 03/26/15 0908 Last data filed at 03/26/15 0855  Gross per 24 hour  Intake  700.8 ml  Output    900 ml  Net -199.2 ml     Physical Exam: General: Chronically ill appearing. No resp difficulty  commands HEENT: normal Neck: supple. JVP to jaw  Carotids 2+ bilat; no bruits. No lymphadenopathy or thryomegaly appreciated. Cor: PMI nondisplaced. Regular rate & rhythm. No rubs, gallops or murmurs. Lungs: clear Abdomen: soft, nontender, nondistended. No hepatosplenomegaly. No bruits or masses. Good bowel sounds. Extremities: no cyanosis, clubbing, rash, R and LLE trace edema. Extremities warm. Bilateral SCDs .  RUE PICC  Neuro: Drowsy. Alert x to self. cranial nerves grossly intact. moves all 4 extremities w/o difficulty. Weak lower extremities. Affect pleasant.   Telemetry: SR 90s Labs: Basic Metabolic Panel:  Recent Labs Lab 03/21/15 1207  03/22/15 0530 03/23/15 0420 03/24/15 0311 03/25/15 2307  NA 131*  --  132* 136 137 137  K 3.6  --  3.6 3.3* 3.6 3.7  CL 86*  --  87* 91* 91* 91*  CO2  --   --   34* 33* 36* 34*  GLUCOSE 196*  --  125* 204* 172* 162*  BUN 54*  --  57* 47* 44* 39*  CREATININE 2.20*  --  2.21* 1.71* 1.54* 1.69*  CALCIUM  --   < > 9.2 9.0 8.9 8.8*  < > = values in this interval not displayed.  Liver Function Tests:  Recent Labs Lab 03/21/15 1906 03/24/15 0311  AST 37 22  ALT 18 16*  ALKPHOS 352* 331*  BILITOT 2.6* 2.0*  PROT 7.6 6.8  ALBUMIN 2.6* 2.3*   No results for input(s): LIPASE, AMYLASE in the last 168 hours.  Recent Labs Lab 03/21/15 1620 03/24/15 0430  AMMONIA 31 27    CBC:  Recent Labs Lab 03/21/15 1200 03/21/15 1207 03/22/15 0530 03/24/15 0311  WBC 9.8  --  8.7 8.5  NEUTROABS 7.4  --   --   --   HGB 9.6* 12.6* 9.1* 9.0*  HCT 29.9* 37.0* 28.2* 29.3*  MCV 68.1*  --  67.0* 68.9*  PLT 312  --  311 271    Cardiac Enzymes: No results for input(s): CKTOTAL, CKMB, CKMBINDEX, TROPONINI in the last 168 hours.  BNP: BNP (last 3 results)  Recent Labs  02/21/15 1415 03/11/15 1702 03/21/15 1200  BNP 1554.1* 1184.5* 824.5*    ProBNP (last 3 results) No results for input(s): PROBNP  in the last 8760 hours.    Other results:  Imaging: No results found.   Medications:     Scheduled Medications: . atorvastatin  40 mg Oral q1800  . buPROPion  150 mg Oral BID  . clopidogrel  75 mg Oral Daily  . DULoxetine  30 mg Oral Daily  . feeding supplement (GLUCERNA SHAKE)  237 mL Oral TID BM  . heparin  5,000 Units Subcutaneous 3 times per day  . hydrALAZINE  25 mg Oral 3 times per day  . insulin aspart  0-9 Units Subcutaneous TID WC  . insulin glargine  16 Units Subcutaneous BID  . isosorbide mononitrate  30 mg Oral Daily  . lipase/protease/amylase  3 capsule Oral TID AC  . magnesium oxide  400 mg Oral BID  . multivitamin with minerals  1 tablet Oral Daily  . olopatadine  1 drop Both Eyes BID  . pantoprazole  40 mg Oral Daily  . potassium chloride  40 mEq Oral BID  . torsemide  20 mg Oral BID  . cyanocobalamin  1,000 mcg Oral  Daily    Infusions: . 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 A/C Systolic Heart Failure  3. CKD Stage III 4. DMII 5. H/P PAD Stent placed R common iliac 02/2015 6 Current tobacco   Plan/Discussion:     Volumes status ok given R/L heart failure. Continue torsemide 20 mg twice a day. Continue hydralazine/imdur at current dose. Creatinine 1.69    [facility] to follow for home milrinone and weekly labs.   We will schedule HF follow up.   Length of Stay: 5   CLEGG,AMYNP-C  03/26/2015, 9:08 AM  Advanced Heart Failure Team Pager [phone] (M-F; 7a - 4p)  Please contact [facility] for night-coverage after hours (4p -7a ) and weekends on [email or web address]  Patient seen with NP, agree with the above note.  He appears stable from cardiac standpoint.  He can go home on his prior cardiac regimen (no changes compared to prior to admission).   Marca Ancona 03/26/2015 9:59 AM

## 2015-03-26 NOTE — Progress Notes (Addendum)
Occupational Therapy Treatment Patient Details Name: Christian Wall MRN: [id number] DOB: 11/09/1960 Today's Date: 03/26/2015    History of present illness 54 year old gentleman with prior h/o chronic systolic heart failure on milrinone drip, polysubstance abuse, DM, peripheral neuropathy, old right frontal craniotomy; recently discharged from cardiology service on 9/6, presented today with worsening sob and confusion and a recent fall   OT comments  Pt with improved activity tolerance and was able to transfer to recliner with mod A +2 (pt typically ambulates with Canadian crutches at home).  He stood with max A for peri care, and then was unable to tolerate further activity.   BP supine 137/66; sitting EOB 85/55 (MD okay'd transer to recliner); sitting in recliner 104/58 and end of session 109/66.   02 sats on RA dropped to 87%, 95% on 2L.    He will need 24 hour physical assistance at home.  Anticipate he will fatigue throughout the day and care needs/assist needs may increase as the day progresses.  Optimally recommend SNF level rehab at discharge.  IF pt/wife choose to discharge home, recommend hospital bed with air mattress overlay, hoyer lift, wheelchair,Tub transfer bench,  BSC, home 02, and will likely need SCAT transport for MD appointments (via w/c) as I am unsure that he will be able to tolerate the number of transfers he will be required to perform to transport via personal vehicle.  If home does not have a ramp, recommend that family arrange to have ramp built.   Follow Up Recommendations  SNF;Supervision/Assistance - 24 hour - If discharge is for home, recommend HHOT, HHPT, HHaid, HHRN, HHaide, HHSW    Equipment Recommendations  3 in 1 bedside comode;Tub/shower bench;Wheelchair (measurements OT);Hospital bed;Other (comment) (hoyer lift, SCAT transportation ).  See OT comments section for details of performance and home equipment needs.    Recommendations for Other Services       Precautions / Restrictions Precautions Precautions: Fall       Mobility Bed Mobility Overal bed mobility: Needs Assistance Bed Mobility: Supine to Sit     Supine to sit: Min assist     General bed mobility comments: Pt required assist to lift trunk from bed.  used bedrails   Transfers Overall transfer level: Needs assistance   Transfers: Stand Pivot Transfers   Stand pivot transfers: Mod assist;+2 physical assistance       General transfer comment: Pt required +2 hand held assist (therapists simulating "[place] crutches")    Balance Overall balance assessment: Needs assistance Sitting-balance support: Feet supported Sitting balance-Leahy Scale: Fair     Standing balance support: Bilateral upper extremity supported Standing balance-Leahy Scale: Zero Standing balance comment: requires max A                    ADL Overall ADL's : Needs assistance/impaired Eating/Feeding: Supervision/ safety;Set up;Sitting   Grooming: Supervision/safety;Set up;Sitting;Wash/dry face   Upper Body Bathing: Supervision/ safety;Set up;Sitting   Lower Body Bathing: Maximal assistance;Sit to/from stand   Upper Body Dressing : Moderate assistance;Sitting   Lower Body Dressing: Total assistance;Sit to/from stand   Toilet Transfer: Moderate assistance;+2 for physical assistance;Stand-pivot;BSC   Toileting- Clothing Manipulation and Hygiene: Total assistance;+2 for physical assistance Toileting - Clothing Manipulation Details (indicate cue type and reason): Pt assisted with peri care.  Required max A to maintain standind and total A for clean up     Functional mobility during ADLs: Moderate assistance;+2 for physical assistance General ADL Comments: Pt reports he uses "Canadian crutches"  for ambulation at home and his sister in law and wife assist hime       Vision                     Perception     Praxis      Cognition   Behavior During Therapy: Flat  affect Overall Cognitive Status: Impaired/Different from baseline Area of Impairment: Orientation;Attention;Memory;Following commands;Safety/judgement;Awareness;Problem solving Orientation Level: Disoriented to;Situation;Time Current Attention Level: Sustained Memory: Decreased short-term memory  Following Commands: Follows one step commands consistently Safety/Judgement: Decreased awareness of safety;Decreased awareness of deficits   Problem Solving: Slow processing;Decreased initiation;Difficulty sequencing;Requires verbal cues;Requires tactile cues General Comments: Pt intermittently confused during session.  Frequently confused home situation with hospital occurrences     Extremity/Trunk Assessment               Exercises     Shoulder Instructions       General Comments      Pertinent Vitals/ Pain       Pain Assessment: No/denies pain  Home Living                                          Prior Functioning/Environment              Frequency Min 3X/week     Progress Toward Goals  OT Goals(current goals can now be found in the care plan section)  Progress towards OT goals: Progressing toward goals  ADL Goals Pt Will Perform Grooming: with set-up;with supervision;sitting Pt Will Perform Upper Body Bathing: with set-up;with supervision;sitting Pt Will Perform Upper Body Dressing: with min assist;sitting Pt Will Transfer to Toilet: with min assist;squat pivot transfer;stand pivot transfer;bedside commode Additional ADL Goal #1: Pt will be Min guard A to come up to EOB with HOB up and use of rail prn Additional ADL Goal #2: Continue to asses vision  Plan Discharge plan needs to be updated    Co-evaluation                 End of Session Equipment Utilized During Treatment: Gait belt;Oxygen   Activity Tolerance Patient limited by fatigue   Patient Left in chair;with call bell/phone within reach;with chair alarm set   Nurse  Communication Mobility status        Time: 4696-2952 OT Time Calculation (min): 46 min  Charges: OT General Charges $OT Visit: 1 Procedure OT Treatments $Therapeutic Activity: 23-37 mins  Conarpe, Wendi M 03/26/2015, 11:39 AM

## 2015-03-26 NOTE — Progress Notes (Signed)
SATURATION QUALIFICATIONS: (This note is used to comply with regulatory documentation for home oxygen)  Patient Saturations on Room Air at Rest = 92%  Patient Saturations on Room Air while Standing = 87%  Patient Saturations on 2 Liters of oxygen while Standing = 95%  Please briefly explain why patient needs home oxygen: due to 02 sats dropping to 87% with activity.  Pt currently, is non - ambulatory.   Jeani Hawking, OTR/L [phone]

## 2015-03-26 NOTE — Progress Notes (Signed)
Physical Therapy Treatment Patient Details Name: Christian Wall MRN: [id number] DOB: Nov 24, 1960 Today's Date: 03/26/2015    History of Present Illness 54 year old gentleman with prior h/o chronic systolic heart failure on milrinone drip, polysubstance abuse, DM, peripheral neuropathy, old right frontal craniotomy; recently discharged from cardiology service on 9/6, presented today with worsening sob and confusion and a recent fall    PT Comments    Pt with improved activity tolerance and was able to transfer to recliner with mod A +2 (pt typically ambulates with [place] crutches at home). He stood with max A for peri care, and then was unable to tolerate further activity. BP supine 137/66; sitting EOB 85/55 (MD okay'd transer to recliner); sitting in recliner 104/58 and end of session 109/66. 02 sats on RA dropped to 87%, 95% on 2L. He will need 24 hour physical assistance at home. Anticipate he will fatigue throughout the day and care needs/assist needs may increase as the day progresses. Optimally recommend SNF level rehab at discharge. IF pt/wife choose to discharge home, recommend hospital bed with air mattress overlay, hoyer lift, wheelchair,Tub transfer bench, BSC, home 02, and will likely need SCAT transport for MD appointments (via w/c) as I am unsure that he will be able to tolerate the number of transfers he will be required to perform to transport via personal vehicle. If home does not have a ramp, recommend that family arrange to have ramp built.   Follow Up Recommendations  Supervision/Assistance - 24 hour;SNF; SNF is optimal, however it seems patient/family are opting for dc home     Equipment Recommendations  Hospital bed (recommend max HHservices/equipment as outlined above)    Recommendations for Other Services Speech consult;OT consult     Precautions / Restrictions Precautions Precautions: Fall    Mobility  Bed Mobility Overal bed mobility: Needs  Assistance Bed Mobility: Supine to Sit     Supine to sit: Min assist     General bed mobility comments: Pt required assist to lift trunk from bed.  used bedrails   Transfers Overall transfer level: Needs assistance Equipment used: 2 person hand held assist (with support given at elbows) Transfers: Stand Pivot Transfers;Sit to/from Stand Sit to Stand: Max assist Stand pivot transfers: Mod assist;+2 physical assistance       General transfer comment: Cues for technique; Pt required +2 hand held assist (therapists simulating "[place] crutches"); very fatigued after pivot transfer, Max assist for sit to stand (for peri-ca after pivot transfer)  Ambulation/Gait                 Stairs            Wheelchair Mobility    Modified Rankin (Stroke Patients Only)       Balance Overall balance assessment: Needs assistance Sitting-balance support: Feet supported Sitting balance-Leahy Scale: Fair     Standing balance support: Bilateral upper extremity supported Standing balance-Leahy Scale: Zero Standing balance comment: max assist                    Cognition Arousal/Alertness: Awake/alert Behavior During Therapy: Flat affect Overall Cognitive Status: Impaired/Different from baseline Area of Impairment: Orientation;Attention;Memory;Following commands;Safety/judgement;Awareness;Problem solving Orientation Level: Disoriented to;Situation;Time Current Attention Level: Sustained Memory: Decreased short-term memory Following Commands: Follows one step commands consistently Safety/Judgement: Decreased awareness of safety;Decreased awareness of deficits   Problem Solving: Slow processing;Decreased initiation;Difficulty sequencing;Requires verbal cues;Requires tactile cues General Comments: Pt intermittently confused during session.  Frequently confused home situation with hospital occurrences  Exercises      General Comments        Pertinent  Vitals/Pain Pain Assessment: No/denies pain    Home Living                      Prior Function            PT Goals (current goals can now be found in the care plan section) Acute Rehab PT Goals Patient Stated Goal: to OOB PT Goal Formulation: Patient unable to participate in goal setting Time For Goal Achievement: 04/05/15 Potential to Achieve Goals: Fair Progress towards PT goals: Progressing toward goals    Frequency  Min 3X/week    PT Plan Current plan remains appropriate    Co-evaluation PT/OT/SLP Co-Evaluation/Treatment: Yes Reason for Co-Treatment: Complexity of the patient's impairments (multi-system involvement);For patient/therapist safety PT goals addressed during session: Mobility/safety with mobility;Balance       End of Session Equipment Utilized During Treatment: Gait belt Activity Tolerance: Patient limited by fatigue Patient left: in chair;with call bell/phone within reach;with chair alarm set     Time: 1610-9604 PT Time Calculation (min) (ACUTE ONLY): 45 min  Charges:  $Therapeutic Activity: 8-22 mins                    G Codes:      Olen Pel 03/26/2015, 1:45 PM  Van Clines,   Acute Rehabilitation Services Pager [phone] Office [phone]

## 2015-03-26 NOTE — Progress Notes (Signed)
[facility]  [facility] hospital team continues to follow pt to support  pending DC to resume home Milrinone/HF home management.  [facility] team will await final resumption of Milrinone/Home HF orders when pt is confirmed for DC to home.   If patient discharges after hours, please call [phone].   Christian Wall 03/26/2015, 4:04 PM

## 2015-03-26 NOTE — Telephone Encounter (Signed)
Post hosp sch per Bard Herbert, RN

## 2015-03-27 ENCOUNTER — Inpatient Hospital Stay (HOSPITAL_COMMUNITY): Payer: Medicare Other

## 2015-03-27 DIAGNOSIS — Z7189 Other specified counseling: Secondary | ICD-10-CM

## 2015-03-27 LAB — GLUCOSE, CAPILLARY
GLUCOSE-CAPILLARY: 114 mg/dL — AB (ref 65–99)
GLUCOSE-CAPILLARY: 77 mg/dL (ref 65–99)
GLUCOSE-CAPILLARY: 95 mg/dL (ref 65–99)
Glucose-Capillary: 59 mg/dL — ABNORMAL LOW (ref 65–99)
Glucose-Capillary: 68 mg/dL (ref 65–99)
Glucose-Capillary: 116 mg/dL — ABNORMAL HIGH (ref 65–99)
Glucose-Capillary: 77 mg/dL (ref 65–99)

## 2015-03-27 LAB — BASIC METABOLIC PANEL
ANION GAP: 10 (ref 5–15)
BUN: 45 mg/dL — ABNORMAL HIGH (ref 6–20)
CALCIUM: 9.5 mg/dL (ref 8.9–10.3)
CHLORIDE: 98 mmol/L — AB (ref 101–111)
CO2: 33 mmol/L — ABNORMAL HIGH (ref 22–32)
CREATININE: 2.17 mg/dL — AB (ref 0.61–1.24)
GFR calc non Af Amer: 33 mL/min — ABNORMAL LOW (ref >60)
GFR, EST AFRICAN AMERICAN: 38 mL/min — AB (ref >60)
Glucose, Bld: 112 mg/dL — ABNORMAL HIGH (ref 65–99)
Potassium: 5.3 mmol/L — ABNORMAL HIGH (ref 3.5–5.1)
SODIUM: 141 mmol/L (ref 135–145)

## 2015-03-27 MED ORDER — SODIUM CHLORIDE 0.9 % IJ SOLN
10.0000 mL | INTRAMUSCULAR | Status: DC | PRN
Start: 2015-03-27 — End: 2015-03-29
  Administered 2015-03-27: 20 mL
  Administered 2015-03-28: 10 mL
  Filled 2015-03-27: qty 40

## 2015-03-27 MED ORDER — POTASSIUM CHLORIDE CRYS ER 20 MEQ PO TBCR: 40.0000 meq | EXTENDED_RELEASE_TABLET | Freq: Two times a day (BID) | ORAL | Status: DC

## 2015-03-27 NOTE — Progress Notes (Signed)
Pt bed alarm went off, when RN got to room, pt on floor beside bed. Charge nurse notified for assistance. Oxygen tubing beside patient on floor, IV tubing wrapped around patients back pulled tightly. Pt attempting to get up and states he needs to get in bed and he hit his head on the chair. Pt states he is dizzy and his head hurts. Patient's RN to room and notified of events.

## 2015-03-27 NOTE — Progress Notes (Signed)
Pt. With CBG of 59 this am. On call NP, K. Craige Cotta made aware. RN will continue to monitor pt. For changes in condition. Wall, Cheryll Dessert

## 2015-03-27 NOTE — Progress Notes (Signed)
Physical Therapy Treatment Patient Details Name: Christian Wall MRN: [id number] DOB: [birth year] Today's Date: 03/27/2015    History of Present Illness 54 year old gentleman with prior h/o chronic systolic heart failure on milrinone drip, polysubstance abuse, DM, peripheral neuropathy, old right frontal craniotomy; recently discharged from cardiology service on 9/6, presented today with worsening sob and confusion and a recent fall    PT Comments    Progressing well.  Beginning to start ambulation.  Emphasized transfers and transfer safety.   Follow Up Recommendations  Supervision/Assistance - 24 hour;SNF     Equipment Recommendations  None recommended by PT    Recommendations for Other Services       Precautions / Restrictions Precautions Precautions: Fall    Mobility  Bed Mobility Overal bed mobility: Needs Assistance Bed Mobility: Supine to Sit     Supine to sit: Min assist     General bed mobility comments: assist to come forward off bed  Transfers Overall transfer level: Needs assistance Equipment used: Rolling walker (2 wheeled) Transfers: Sit to/from [employer] Sit to Stand: Mod assist Stand pivot transfers: Mod assist;+2 physical assistance       General transfer comment: demo and cueing for technique, hand placement.  Ambulation/Gait Ambulation/Gait assistance: Mod assist;+2 safety/equipment Ambulation Distance (Feet): 16 Feet (then 10 then 14 feet respectively with rest in between) Assistive device: Rolling walker (2 wheeled) Gait Pattern/deviations: Step-to pattern;Decreased step length - right;Decreased step length - left;Decreased stride length Gait velocity: slower Gait velocity interpretation: Below normal speed for age/gender General Gait Details: uncoordinated step to gait.  Up on his R toes.  Trouble sequencing with the RW   Stairs            Wheelchair Mobility    Modified Rankin (Stroke Patients Only)        Balance Overall balance assessment: Needs assistance   Sitting balance-Leahy Scale: Fair Sitting balance - Comments: tendency to posterior lean   Standing balance support: Bilateral upper extremity supported Standing balance-Leahy Scale: Poor Standing balance comment: requiring mod assist                    Cognition Arousal/Alertness: Awake/alert Behavior During Therapy: Flat affect Overall Cognitive Status: Impaired/Different from baseline     Current Attention Level: Selective   Following Commands: Follows one step commands consistently Safety/Judgement: Decreased awareness of safety;Decreased awareness of deficits Awareness: Emergent Problem Solving: Slow processing General Comments: pt still with intermittent confusion    Exercises      General Comments        Pertinent Vitals/Pain Pain Assessment: No/denies pain    Home Living                      Prior Function            PT Goals (current goals can now be found in the care plan section) Acute Rehab PT Goals Patient Stated Goal: to OOB PT Goal Formulation: Patient unable to participate in goal setting Time For Goal Achievement: 04/05/15 Potential to Achieve Goals: Fair Progress towards PT goals: Progressing toward goals    Frequency  Min 3X/week    PT Plan Current plan remains appropriate    Co-evaluation             End of Session   Activity Tolerance: Patient limited by fatigue Patient left: in chair;with call bell/phone within reach;with chair alarm set     Time: 1315-1340 PT Time Calculation (min) (  ACUTE ONLY): 25 min  Charges:  $Gait Training: 8-22 mins $Therapeutic Activity: 8-22 mins                    G Codes:      Mottinger, Eliseo Gum 03/27/2015, 3:19 PM I 03/27/2015  G[place]coe Bing, PT [phone]  (pager)

## 2015-03-27 NOTE — Plan of Care (Signed)
Problem: Phase I Progression Outcomes Goal: OOB as tolerated unless otherwise ordered Outcome: Progressing Up in recliner today, but would not stand to go back to bed. Had to use lift/sling.

## 2015-03-27 NOTE — Progress Notes (Signed)
[place] TEAM  PROGRESS NOTE  Christian Wall YFV:[id number] DOB: Nov 09, 1960 DOA: 03/21/2015 PCP: Dorrene German, MD  Admit HPI / Brief Narrative: 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 He was discharged from the cardiology service 03/13/2015 after being treated for acute on chronic systolic heart failure. His wife said he was doing fairly well until he began to become confused.   In the emergency department his WBC was normal, BNP was 824, creatinine 2.2 (elevated from previous but still within his baseline), BUN slightly elevated. Chest x-ray showed bilateral airspace disease-pulmonary edema versus infection.  HPI/Subjective: Patient is awake and alert but still disoriented and confused.  Assessment/Plan:  Acute encephalopathy -UDS negative -B12 normal/high - folate normal - TSH normal  -Head CT nondiagnostic -Possibly iatrogenic:  patient on OxyIR, Lyrica, Zanaflex, and tramadol at home - holding all sedating medications  -?hepatic encephalopathy - doubt given low ammonia - ammonia level stable therefore will not start lactulose  -Continue to hold sedating medication  Chronic Systolic CHF (NICM), NYHA III  -care per CHF Team  Essential HTN -BP well controlled   DM Type II -Over the last year patient's hemoglobin A1c has gone from 15.2--> 7.4 showing significant improvement. - CBGs well controlled   HLD -Continue Lipitor   Acute on chronic kidney failure stage III (baseline Cr 1.29-3.22) -Cr now within patient's baseline at 1.69.  Peripheral neuropathy -Currently not complaining of symptoms  Depression -Continue Cymbalta 30 mg daily  Gout - Right knee pain  -9/14 Uric acid 14.8 -Currently not complaining of pain - knee w/o color or erythema   Hypokalemia -f/u in  AM  Wounds  -Sacral and lower extremity -Wound Care RN assistance requested  Tobacco Abuse  -Patient states stopped smoking ~2 months ago;  encouraged to continue to not smoke.   Code Status: FULL Family Communication: Spoke with wife at bedside Disposition Plan: SDU - probable d/c home 9/19   Consultants: Heart failure team Palliative Care   Procedures: None  Antibiotics: None  DVT prophylaxis: SCDs  Objective: Blood pressure 109/73, pulse 99, temperature 97.6 F (36.4 C), temperature source Oral, resp. rate 18, height 5\' 7"  (1.702 m), weight 80.8 kg (178 lb 2.1 oz), SpO2 91 %.  Intake/Output Summary (Last 24 hours) at 03/27/15 1428 Last data filed at 03/27/15 0842  Gross per 24 hour  Intake  710.4 ml  Output    500 ml  Net  210.4 ml   Exam: General: No acute respiratory distress - alert / conversant  Lungs: Clear to auscultation bilaterally - no wheeze or crackles  Cardiovascular: Regular rate and rhythm without murmur gallop or rub normal S1 and S2 Abdomen: Nontender, nondistended, soft, bowel sounds positive, no rebound, no ascites, no appreciable mass Extremities: No significant cyanosis, clubbing, or edema bilateral lower extremities  Data Reviewed: Basic Metabolic Panel:  Recent Labs Lab 03/21/15 1207 03/22/15 0530 03/23/15 0420 03/24/15 0311 03/25/15 2307  NA 131* 132* 136 137 137  K 3.6 3.6 3.3* 3.6 3.7  CL 86* 87* 91* 91* 91*  CO2  --  34* 33* 36* 34*  GLUCOSE 196* 125* 204* 172* 162*  BUN 54* 57* 47* 44* 39*  CREATININE 2.20* 2.21* 1.71* 1.54* 1.69*  CALCIUM  --  9.2 9.0 8.9 8.8*    CBC:  Recent Labs Lab  03/21/15 1200 03/21/15 1207 03/22/15 0530 03/24/15 0311  WBC 9.8  --  8.7 8.5  NEUTROABS 7.4  --   --   --   HGB 9.6* 12.6* 9.1* 9.0*  HCT 29.9* 37.0* 28.2* 29.3*  MCV 68.1*  --  67.0* 68.9*  PLT 312  --  311 271    Liver Function Tests:  Recent Labs Lab 03/21/15 1906 03/24/15 0311  AST 37 22  ALT 18 16*  ALKPHOS  352* 331*  BILITOT 2.6* 2.0*  PROT 7.6 6.8  ALBUMIN 2.6* 2.3*    Recent Labs Lab 03/21/15 1620 03/24/15 0430  AMMONIA 31 27   CBG:  Recent Labs Lab 03/27/15 0039 03/27/15 0440 03/27/15 0526 03/27/15 0755 03/27/15 1130  GLUCAP 77 59* 68 116* 114*    Recent Results (from the past 240 hour(s))  Culture, blood (routine x 2)     Status: None   Collection Time: 03/21/15  5:44 PM  Result Value Ref Range Status   Specimen Description BLOOD LEFT ARM  Final   Special Requests BOTTLES DRAWN AEROBIC AND ANAEROBIC 10CC  Final   Culture NO GROWTH 5 DAYS  Final   Report Status 03/26/2015 FINAL  Final  Urine culture     Status: None   Collection Time: 03/21/15  6:33 PM  Result Value Ref Range Status   Specimen Description URINE, CLEAN CATCH  Final   Special Requests NONE  Final   Culture MULTIPLE SPECIES PRESENT, SUGGEST RECOLLECTION  Final   Report Status 03/23/2015 FINAL  Final  Culture, blood (routine x 2)     Status: None   Collection Time: 03/21/15  7:40 PM  Result Value Ref Range Status   Specimen Description BLOOD LEFT HAND  Final   Special Requests IN PEDIATRIC BOTTLE 2CC  Final   Culture NO GROWTH 5 DAYS  Final   Report Status 03/26/2015 FINAL  Final     Studies:   Recent x-ray studies have been reviewed in detail by the Attending Physician  Scheduled Meds:  Scheduled Meds: . atorvastatin  40 mg Oral q1800  . buPROPion  150 mg Oral BID  . clopidogrel  75 mg Oral Daily  . DULoxetine  30 mg Oral Daily  . feeding supplement (GLUCERNA SHAKE)  237 mL Oral TID BM  . heparin  5,000 Units Subcutaneous 3 times per day  . hydrALAZINE  25 mg Oral 3 times per day  . insulin aspart  0-9 Units Subcutaneous TID WC  . insulin glargine  16 Units Subcutaneous BID  . isosorbide mononitrate  30 mg Oral Daily  . lipase/protease/amylase  3 capsule Oral TID AC  . magnesium oxide  400 mg Oral BID  . multivitamin with minerals  1 tablet Oral Daily  . olopatadine  1 drop Both Eyes  BID  . pantoprazole  40 mg Oral Daily  . potassium chloride  40 mEq Oral BID  . torsemide  20 mg Oral BID  . cyanocobalamin  1,000 mcg Oral Daily    Time spent on care of this patient: 25 mins   Clint Lipps , MD   [facility] Office  [phone] Pager - Text Page per Loretha Stapler as per below:  On-Call/Text Page:      [email or web address]      password [email or web address]  If 7PM-7AM, please contact night-coverage [email or web address] Password [email or web address] 03/27/2015, 2:28 PM   LOS: 6 days

## 2015-03-27 NOTE — Care Management Important Message (Signed)
Important Message  Patient Details  Name: Tregan Whitfield MRN: [id number] Date of Birth: January 03, 1961   Medicare Important Message Given:  Yes-third notification given    Orson Aloe 03/27/2015, 11:26 AM

## 2015-03-27 NOTE — Progress Notes (Signed)
New order received to administer 12U of Lantus.

## 2015-03-27 NOTE — Progress Notes (Signed)
Pt. Found on floor by floor RN this am. With bed alarm alarming. Pt. C/o dizziness at the time. No visible injuries noted. Pt. BP 100/65, HR 101. On call NP, Donnamarie Poag, made aware. Pt. CBG 68. Pt. Stated he hit his on a chair at the bedside. Pts. PICC infusing at the time of fall.  New orders received for CT of head and X-Ray to confirm picc placement. Neuro check completed and WNL.  Pt. Placed back in bed and reoriented. Bed alarm on. Pts. Spouse, Fahd Antczak made aware of pts. Incident. Pt. Also moved to camera room. Family made aware. RN will continue to monitor pt. For changes in condition. Wall, Cheryll Dessert

## 2015-03-27 NOTE — Progress Notes (Signed)
Wife here and told of event.

## 2015-03-27 NOTE — Progress Notes (Signed)
Advanced Heart Failure Rounding Note   Subjective:    Admitted with AMS and volume overload. Ammonia on admit 31. CT of head no acute process.   Earlier this morning he fell. Glucose 68.   Denies SOB or orthopnea. Complaining of leg fatigue.   UA negative UDS negative  Objective:   Weight Range:  Vital Signs:   Temp:  [97.6 F (36.4 C)-97.8 F (36.6 C)] 97.6 F (36.4 C) (09/20 1222) Pulse Rate:  [26-101] 99 (09/20 1222) Resp:  [18-21] 18 (09/20 1222) BP: (100-114)/(65-78) 109/73 mmHg (09/20 1222) SpO2:  [91 %-100 %] 91 % (09/20 1222) Weight:  [178 lb 2.1 oz (80.8 kg)] 178 lb 2.1 oz (80.8 kg) (09/19 1608) Last BM Date: 03/26/15  Weight change: Filed Weights   03/25/15 0328 03/26/15 0504 03/26/15 1608  Weight: 178 lb 9.2 oz (81 kg) 177 lb 4 oz (80.4 kg) 178 lb 2.1 oz (80.8 kg)    Intake/Output:   Intake/Output Summary (Last 24 hours) at 03/27/15 1241 Last data filed at 03/27/15 0842  Gross per 24 hour  Intake 1070.4 ml  Output    500 ml  Net  570.4 ml     Physical Exam: General: Chronically ill appearing. No resp difficulty  commands HEENT: normal Neck: supple. JVP to jaw  Carotids 2+ bilat; no bruits. No lymphadenopathy or thryomegaly appreciated. Cor: PMI nondisplaced. Regular rate & rhythm. No rubs, gallops or murmurs. Lungs: clear Abdomen: soft, nontender, nondistended. No hepatosplenomegaly. No bruits or masses. Good bowel sounds. Extremities: no cyanosis, clubbing, rash, R and LLE no edema. Extremities warm. Bilateral SCDs .  RUE PICC  Neuro: Drowsy. Alert x to self. cranial nerves grossly intact. moves all 4 extremities w/o difficulty. Weak lower extremities. Affect pleasant.   Telemetry: SR 90s Labs: Basic Metabolic Panel:  Recent Labs Lab 03/21/15 1207  03/22/15 0530 03/23/15 0420 03/24/15 0311 03/25/15 2307  NA 131*  --  132* 136 137 137  K 3.6  --  3.6 3.3* 3.6 3.7  CL 86*  --  87* 91* 91* 91*  CO2  --   --  34* 33* 36* 34*  GLUCOSE  196*  --  125* 204* 172* 162*  BUN 54*  --  57* 47* 44* 39*  CREATININE 2.20*  --  2.21* 1.71* 1.54* 1.69*  CALCIUM  --   < > 9.2 9.0 8.9 8.8*  < > = values in this interval not displayed.  Liver Function Tests:  Recent Labs Lab 03/21/15 1906 03/24/15 0311  AST 37 22  ALT 18 16*  ALKPHOS 352* 331*  BILITOT 2.6* 2.0*  PROT 7.6 6.8  ALBUMIN 2.6* 2.3*   No results for input(s): LIPASE, AMYLASE in the last 168 hours.  Recent Labs Lab 03/21/15 1620 03/24/15 0430  AMMONIA 31 27    CBC:  Recent Labs Lab 03/21/15 1200 03/21/15 1207 03/22/15 0530 03/24/15 0311  WBC 9.8  --  8.7 8.5  NEUTROABS 7.4  --   --   --   HGB 9.6* 12.6* 9.1* 9.0*  HCT 29.9* 37.0* 28.2* 29.3*  MCV 68.1*  --  67.0* 68.9*  PLT 312  --  311 271    Cardiac Enzymes: No results for input(s): CKTOTAL, CKMB, CKMBINDEX, TROPONINI in the last 168 hours.  BNP: BNP (last 3 results)  Recent Labs  02/21/15 1415 03/11/15 1702 03/21/15 1200  BNP 1554.1* 1184.5* 824.5*    ProBNP (last 3 results) No results for input(s): PROBNP in the last 8760 hours.  Other results:  Imaging: Dg Chest 1 View  03/27/2015   CLINICAL DATA:  Status post fall, evaluate PICC  EXAM: CHEST  1 VIEW  COMPARISON:  03/22/2015  FINDINGS: Cardiomegaly.  Patchy/interstitial opacities, upper lobe predominant, mildly improved. Differential considerations include multifocal pneumonia versus mild interstitial edema.  No pleural effusion or pneumothorax.  Right arm PICC terminates in the lower SVC.  IMPRESSION: Right arm PICC terminates in the lower SVC.  Multifocal patchy opacities, upper lobe predominant, mildly improved. Differential considerations include multifocal pneumonia (favored) versus mild interstitial edema.   Electronically Signed   By: Charline Bills M.D.   On: 03/27/2015 06:31   Ct Head Wo Contrast  03/27/2015   CLINICAL DATA:  Fall out of bed with closed-head injury  EXAM: CT HEAD WITHOUT CONTRAST  TECHNIQUE:  Contiguous axial images were obtained from the base of the skull through the vertex without intravenous contrast.  COMPARISON:  03/21/2015  FINDINGS: Bony calvarium is intact with the exception of a previous right frontal craniotomy. Mild atrophic changes are seen. Areas of encephalomalacia in the right frontal lobe are noted related to the prior surgery. No findings to suggest acute hemorrhage, acute infarction or space-occupying mass lesion are noted.  IMPRESSION: Mild atrophic changes.  No acute abnormality noted.   Electronically Signed   By: Alcide Clever M.D.   On: 03/27/2015 08:27     Medications:     Scheduled Medications: . atorvastatin  40 mg Oral q1800  . buPROPion  150 mg Oral BID  . clopidogrel  75 mg Oral Daily  . DULoxetine  30 mg Oral Daily  . feeding supplement (GLUCERNA SHAKE)  237 mL Oral TID BM  . heparin  5,000 Units Subcutaneous 3 times per day  . hydrALAZINE  25 mg Oral 3 times per day  . insulin aspart  0-9 Units Subcutaneous TID WC  . insulin glargine  16 Units Subcutaneous BID  . isosorbide mononitrate  30 mg Oral Daily  . lipase/protease/amylase  3 capsule Oral TID AC  . magnesium oxide  400 mg Oral BID  . multivitamin with minerals  1 tablet Oral Daily  . olopatadine  1 drop Both Eyes BID  . pantoprazole  40 mg Oral Daily  . potassium chloride  40 mEq Oral BID  . torsemide  20 mg Oral BID  . cyanocobalamin  1,000 mcg Oral Daily    Infusions: . 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 A/C Systolic Heart Failure  3. CKD Stage III 4. DMII- hypoglycemic this am  5. H/P PAD Stent placed R common iliac 02/2015 6 Current tobacco  7. Fall  Plan/Discussion:     Volumes status ok given R/L heart failure. Continue milrinone 0.375 mcg. Continue torsemide 20 mg  twice a day. Continue hydralazine/imdur at current dose. Check BMET.   [facility] to follow for home milrinone and weekly labs.   We will schedule HF follow up.   Disposition per primary team to snf. Only 2 SNF that will take milrinone --> [facility] or [facility].   Length of Stay: 6   CLEGG,AMYNP-C  03/27/2015, 12:41 PM  Advanced Heart Failure Team Pager [phone] (M-F; 7a - 4p)  Please contact [facility] for night-coverage after hours (4p -7a ) and weekends on [email or web address]  Patient seen with NP, agree with the above note.  Awaiting  SNF placement.  Stable from cardiac standpoint.   Marca Ancona 03/27/2015 1:48 PM

## 2015-03-27 NOTE — Progress Notes (Addendum)
Inpatient Diabetes Program Recommendations  AACE/ADA: New Consensus Statement on Inpatient Glycemic Control (2015)  Target Ranges:  Prepandial:   less than 140 mg/dL      Peak postprandial:   less than 180 mg/dL (1-2 hours)      Critically ill patients:  140 - 180 mg/dL    Results for CARLES, WHITBY (MRN [id number]) as of 03/27/2015 15:00  Ref. Range 03/27/2015 00:39 03/27/2015 04:40 03/27/2015 05:26 03/27/2015 07:55 03/27/2015 11:30  Glucose-Capillary Latest Ref Range: 65-99 mg/dL 77 59 (L) 68 009 (H) 233 (H)    Current DM Orders: Lantus 16 units bid            Novolog Sensitive SSI TID AC     -Patient refused Lantus 16 units last PM.  Would take 12 units Lantus instead.  -Hypoglycemic this AM (CBG 59 mg/dl).    MD- Please consider decreasing Lantus to 10 units bid     Will follow Ambrose Finland RN, MSN, CDE Diabetes Coordinator Inpatient Glycemic Control Team Team Pager: [phone] (8a-5p)

## 2015-03-27 NOTE — Progress Notes (Signed)
Patient had walked with Physical Therapy and 2 assists earlier. They placed patient in recliner. He was ready to go to bed, so 2 assistants used walker to attempt to put patient back to bed but his legs would not hold him and he could not understand that he needed to hold on to walker. Went to knees without falling to floor. Used Sling and Lift to put patient back to bed.

## 2015-03-27 NOTE — Progress Notes (Addendum)
Pt. With CBG of 82. Lantus scheduled. On call NP, Donnamarie Poag made aware. 16u of Lantus held. RN will continue to monitor pt. For changes in condition. Yurianna Tusing, Cheryll Dessert

## 2015-03-28 DIAGNOSIS — E785 Hyperlipidemia, unspecified: Secondary | ICD-10-CM

## 2015-03-28 DIAGNOSIS — M25561 Pain in right knee: Secondary | ICD-10-CM

## 2015-03-28 DIAGNOSIS — I270 Primary pulmonary hypertension: Secondary | ICD-10-CM

## 2015-03-28 LAB — BASIC METABOLIC PANEL
ANION GAP: 11 (ref 5–15)
BUN: 49 mg/dL — ABNORMAL HIGH (ref 6–20)
CALCIUM: 9.7 mg/dL (ref 8.9–10.3)
CO2: 31 mmol/L (ref 22–32)
CREATININE: 2.48 mg/dL — AB (ref 0.61–1.24)
Chloride: 99 mmol/L — ABNORMAL LOW (ref 101–111)
GFR calc non Af Amer: 28 mL/min — ABNORMAL LOW (ref >60)
GFR, EST AFRICAN AMERICAN: 32 mL/min — AB (ref >60)
GLUCOSE: 74 mg/dL (ref 65–99)
Potassium: 5.5 mmol/L — ABNORMAL HIGH (ref 3.5–5.1)
Sodium: 141 mmol/L (ref 135–145)

## 2015-03-28 LAB — GLUCOSE, CAPILLARY
GLUCOSE-CAPILLARY: 166 mg/dL — AB (ref 65–99)
GLUCOSE-CAPILLARY: 123 mg/dL — AB (ref 65–99)
GLUCOSE-CAPILLARY: 87 mg/dL (ref 65–99)
GLUCOSE-CAPILLARY: 141 mg/dL — AB (ref 65–99)
Glucose-Capillary: 68 mg/dL (ref 65–99)

## 2015-03-28 MED ORDER — INSULIN GLARGINE 100 UNIT/ML ~~LOC~~ SOLN
12.0000 [IU] | Freq: Two times a day (BID) | SUBCUTANEOUS | Status: DC
  Administered 2015-03-29: 12 [IU] via SUBCUTANEOUS
  Filled 2015-03-28 (×5): qty 0.12

## 2015-03-28 MED ORDER — DEXTROSE 50 % IV SOLN
INTRAVENOUS | Status: AC
  Administered 2015-03-28: 50 mL
  Filled 2015-03-28: qty 50

## 2015-03-28 NOTE — Clinical Social Work Placement (Signed)
   CLINICAL SOCIAL WORK PLACEMENT  NOTE  Date:  03/28/2015  Patient Details  Name: Christian Wall MRN: [id number] Date of Birth: Aug 04, 1960  Clinical Social Work is seeking post-discharge placement for this patient at the Skilled  Nursing Facility level of care (*CSW will initial, date and re-position this form in  chart as items are completed):  Yes   Patient/family provided with [place] Clinical Social Work Department's list of facilities offering this level of care within the geographic area requested by the patient (or if unable, by the patient's family).  Yes   Patient/family informed of their freedom to choose among providers that offer the needed level of care, that participate in Medicare, Medicaid or managed care program needed by the patient, have an available bed and are willing to accept the patient.  Yes   Patient/family informed of 's ownership interest in [facility] and [facility], as well as of the fact that they are under no obligation to receive care at these facilities.  PASRR submitted to EDS on 03/28/15     PASRR number received on 03/28/15     Existing PASRR number confirmed on       FL2 transmitted to all facilities in geographic area requested by pt/family on 03/28/15     FL2 transmitted to all facilities within larger geographic area on       Patient informed that his/her managed care company has contracts with or will negotiate with certain facilities, including the following:            Patient/family informed of bed offers received.  Patient chooses bed at       Physician recommends and patient chooses bed at      Patient to be transferred to   on  .  Patient to be transferred to facility by       Patient family notified on   of transfer.  Name of family member notified:        PHYSICIAN Please sign FL2     Additional Comment:    _______________________________________________ Sharol Harness, LCSW [phone]

## 2015-03-28 NOTE — Progress Notes (Signed)
Advanced Heart Failure Rounding Note   Subjective:    Admitted with AMS and volume overload. Ammonia on admit 31. CT of head no acute process.   Remains confused. Unable to tell me year. Knows he is at [facility].   No dyspnea.   Objective:   Weight Range:  Vital Signs:   Temp:  [97.8 F (36.6 C)-98.4 F (36.9 C)] 98.4 F (36.9 C) (09/21 2111) Pulse Rate:  [88-94] 94 (09/21 2111) Resp:  [18] 18 (09/21 2111) BP: (91-111)/(62-89) 109/89 mmHg (09/21 2111) SpO2:  [93 %] 93 % (09/21 2111) Weight:  [80.7 kg (177 lb 14.6 oz)] 80.7 kg (177 lb 14.6 oz) (09/21 0421) Last BM Date: 03/28/15  Weight change: Filed Weights   03/26/15 0504 03/26/15 1608 03/28/15 0421  Weight: 80.4 kg (177 lb 4 oz) 80.8 kg (178 lb 2.1 oz) 80.7 kg (177 lb 14.6 oz)    Intake/Output:   Intake/Output Summary (Last 24 hours) at 03/28/15 2255 Last data filed at 03/28/15 2000  Gross per 24 hour  Intake  725.2 ml  Output    301 ml  Net  424.2 ml     Physical Exam: General: Chronically ill appearing. No resp difficulty  commands HEENT: normal Neck: supple. JVP to 12  Carotids 2+ bilat; no bruits. No lymphadenopathy or thryomegaly appreciated. Cor: PMI nondisplaced. Regular rate & rhythm. No rubs, gallops or murmurs. Lungs: clear Abdomen: soft, nontender, nondistended. No hepatosplenomegaly. No bruits or masses. Good bowel sounds. Extremities: no cyanosis, clubbing, rash, R and LLE no edema. Extremities warm. Bilateral SCDs .  RUE PICC  Neuro: Drowsy. Alert x to self and place. cranial nerves grossly intact. moves all 4 extremities w/o difficulty. Weak lower extremities. Affect pleasant.   Telemetry: SR 90s Labs: Basic Metabolic Panel:  Recent Labs Lab 03/23/15 0420 03/24/15 0311 03/25/15 2307 03/27/15 1523 03/28/15 0435  NA 136 137 137 141 141  K 3.3* 3.6 3.7 5.3* 5.5*  CL 91* 91* 91* 98* 99*  CO2 33* 36* 34* 33* 31  GLUCOSE 204* 172* 162* 112* 74  BUN 47* 44* 39* 45* 49*  CREATININE 1.71*  1.54* 1.69* 2.17* 2.48*  CALCIUM 9.0 8.9 8.8* 9.5 9.7    Liver Function Tests:  Recent Labs Lab 03/24/15 0311  AST 22  ALT 16*  ALKPHOS 331*  BILITOT 2.0*  PROT 6.8  ALBUMIN 2.3*   No results for input(s): LIPASE, AMYLASE in the last 168 hours.  Recent Labs Lab 03/24/15 0430  AMMONIA 27    CBC:  Recent Labs Lab 03/22/15 0530 03/24/15 0311  WBC 8.7 8.5  HGB 9.1* 9.0*  HCT 28.2* 29.3*  MCV 67.0* 68.9*  PLT 311 271    Cardiac Enzymes: No results for input(s): CKTOTAL, CKMB, CKMBINDEX, TROPONINI in the last 168 hours.  BNP: BNP (last 3 results)  Recent Labs  02/21/15 1415 03/11/15 1702 03/21/15 1200  BNP 1554.1* 1184.5* 824.5*    ProBNP (last 3 results) No results for input(s): PROBNP in the last 8760 hours.    Other results:  Imaging: Dg Chest 1 View  03/27/2015   CLINICAL DATA:  Status post fall, evaluate PICC  EXAM: CHEST  1 VIEW  COMPARISON:  03/22/2015  FINDINGS: Cardiomegaly.  Patchy/interstitial opacities, upper lobe predominant, mildly improved. Differential considerations include multifocal pneumonia versus mild interstitial edema.  No pleural effusion or pneumothorax.  Right arm PICC terminates in the lower SVC.  IMPRESSION: Right arm PICC terminates in the lower SVC.  Multifocal patchy opacities, upper lobe  predominant, mildly improved. Differential considerations include multifocal pneumonia (favored) versus mild interstitial edema.   Electronically Signed   By: Charline Bills M.D.   On: 03/27/2015 06:31   Ct Head Wo Contrast  03/27/2015   CLINICAL DATA:  Fall out of bed with closed-head injury  EXAM: CT HEAD WITHOUT CONTRAST  TECHNIQUE: Contiguous axial images were obtained from the base of the skull through the vertex without intravenous contrast.  COMPARISON:  03/21/2015  FINDINGS: Bony calvarium is intact with the exception of a previous right frontal craniotomy. Mild atrophic changes are seen. Areas of encephalomalacia in the right  frontal lobe are noted related to the prior surgery. No findings to suggest acute hemorrhage, acute infarction or space-occupying mass lesion are noted.  IMPRESSION: Mild atrophic changes.  No acute abnormality noted.   Electronically Signed   By: Alcide Clever M.D.   On: 03/27/2015 08:27     Medications:     Scheduled Medications: . atorvastatin  40 mg Oral q1800  . buPROPion  150 mg Oral BID  . clopidogrel  75 mg Oral Daily  . DULoxetine  30 mg Oral Daily  . feeding supplement (GLUCERNA SHAKE)  237 mL Oral TID BM  . heparin  5,000 Units Subcutaneous 3 times per day  . hydrALAZINE  25 mg Oral 3 times per day  . insulin aspart  0-9 Units Subcutaneous TID WC  . insulin glargine  12 Units Subcutaneous BID  . isosorbide mononitrate  30 mg Oral Daily  . lipase/protease/amylase  3 capsule Oral TID AC  . magnesium oxide  400 mg Oral BID  . multivitamin with minerals  1 tablet Oral Daily  . olopatadine  1 drop Both Eyes BID  . pantoprazole  40 mg Oral Daily  . torsemide  20 mg Oral BID  . cyanocobalamin  1,000 mcg Oral Daily    Infusions: . 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 A/C Systolic Heart Failure  3. CKD Stage III 4. DMII- hypoglycemic this am  5. H/P PAD Stent placed R common iliac 02/2015 6 Current tobacco  7. Fall  Plan/Discussion:     HF is stable but remains confused. No clear etiology for delirium. Will check RPR. Not sure many options left.   Disposition per primary team to SNF. Only 2 SNF that will take milrinone --> [facility] or [facility].   Length of Stay: 7   Bensimhon, Daniel MD 03/28/2015, 10:55 PM  Advanced Heart Failure Team Pager [phone] (M-F; 7a - 4p)  Please contact [facility] for night-coverage after hours (4p -7a )  and weekends on [email or web address]

## 2015-03-28 NOTE — Progress Notes (Signed)
[place] TEAM  PROGRESS NOTE  Christian Wall ZOX:[id number] DOB: 06/29/1961 DOA: 03/21/2015 PCP: Dorrene German, MD  Admit HPI / Brief Narrative: 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 He was discharged from the cardiology service 03/13/2015 after being treated for acute on chronic systolic heart failure. His wife said he was doing fairly well until he began to become confused.   In the emergency department his WBC was normal, BNP was 824, creatinine 2.2 (elevated from previous but still within his baseline), BUN slightly elevated. Chest x-ray showed bilateral airspace disease-pulmonary edema versus infection.  HPI/Subjective: Awake and alert, still disoriented to place but looks much better than yesterday. 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  Assessment/Plan:  Acute encephalopathy -UDS negative -B12 normal/high - folate normal - TSH normal  -Head CT nondiagnostic -Possibly iatrogenic:  patient on OxyIR, Lyrica, Zanaflex, and tramadol at home - holding all sedating medications  -?hepatic encephalopathy - doubt given low ammonia - ammonia level stable therefore will not start lactulose  -Continue to hold sedating medication, improving, will check with his wife if he is close to his baseline. -CSW for SNF placement.  Chronic Systolic CHF (NICM), NYHA III  -care per CHF Team  Essential HTN -BP well controlled   DM Type II -Over the last year patient's hemoglobin A1c has gone from 15.2--> 7.4 showing significant improvement. - CBGs well controlled   HLD -Continue Lipitor   Acute on chronic kidney failure stage III (baseline Cr 1.29-3.22) -Cr now within patient's baseline at 1.69.. -Worsening of creatinine from 1.7---->2.4,  likely secondary to diuresis. -Continue diuresis per cardiology recommendation.  Peripheral neuropathy -Currently not complaining of symptoms  Depression -Continue Cymbalta 30 mg daily  Gout - Right knee pain  -9/14 Uric acid 14.8 -Currently not complaining of pain - knee w/o color or erythema   Hypokalemia -f/u in AM  Wounds  -Sacral and lower extremity -Wound Care RN assistance requested  Tobacco Abuse  -Patient states stopped smoking ~2 months ago;  encouraged to continue to not smoke.  Hyperkalemia -Slight hyperkalemia with potassium of 5.5, patient was getting potassium supplementation 40 mg twice a day. -This is discontinued earlier today.   Code Status: FULL Family Communication: Spoke with wife at bedside Disposition Plan: SDU - probable d/c home 9/19   Consultants: Heart failure team Palliative Care   Procedures: None  Antibiotics: None  DVT prophylaxis: SCDs  Objective: Blood pressure 91/62, pulse 88, temperature 97.8 F (36.6 C), temperature source Oral, resp. rate 18, height  (1.702 m), weight 80.7 kg (177 lb 14.6 oz), SpO2 93 %.  Intake/Output Summary (Last 24 hours) at 03/28/15 1157 Last data filed at 03/28/15 0944  Gross per 24 hour  Intake  820.8 ml  Output    301 ml  Net  519.8 ml   Exam: General: No acute respiratory distress - alert / conversant  Lungs: Clear to auscultation bilaterally - no wheeze or crackles  Cardiovascular: Regular rate and rhythm without murmur gallop or rub normal S1 and S2 Abdomen: Nontender, nondistended, soft, bowel sounds positive, no rebound, no ascites, no appreciable mass Extremities: No significant cyanosis, clubbing, or edema bilateral lower extremities  Data Reviewed: Basic Metabolic Panel:  Recent Labs Lab 03/23/15 0420  03/24/15 0311 03/25/15 2307 03/27/15 1523 03/28/15 0435  NA 136 137 137 141 141  K 3.3* 3.6 3.7 5.3* 5.5*  CL 91* 91* 91* 98* 99*  CO2 33* 36* 34* 33* 31  GLUCOSE  204* 172* 162* 112* 74  BUN 47* 44* 39* 45* 49*  CREATININE 1.71* 1.54* 1.69* 2.17* 2.48*  CALCIUM 9.0 8.9 8.8* 9.5 9.7    CBC:  Recent Labs Lab 03/21/15 1200 03/21/15 1207 03/22/15 0530 03/24/15 0311  WBC 9.8  --  8.7 8.5  NEUTROABS 7.4  --   --   --   HGB 9.6* 12.6* 9.1* 9.0*  HCT 29.9* 37.0* 28.2* 29.3*  MCV 68.1*  --  67.0* 68.9*  PLT 312  --  311 271    Liver Function Tests:  Recent Labs Lab 03/21/15 1906 03/24/15 0311  AST 37 22  ALT 18 16*  ALKPHOS 352* 331*  BILITOT 2.6* 2.0*  PROT 7.6 6.8  ALBUMIN 2.6* 2.3*    Recent Labs Lab 03/21/15 1620 03/24/15 0430  AMMONIA 31 27   CBG:  Recent Labs Lab 03/27/15 1646 03/27/15 2052 03/28/15 0615 03/28/15 0650 03/28/15 1102  GLUCAP 95 77 68 166* 123*    Recent Results (from the past 240 hour(s))  Culture, blood (routine x 2)     Status: None   Collection Time: 03/21/15  5:44 PM  Result Value Ref Range Status   Specimen Description BLOOD LEFT ARM  Final   Special Requests BOTTLES DRAWN AEROBIC AND ANAEROBIC 10CC  Final   Culture NO GROWTH 5 DAYS  Final   Report Status 03/26/2015 FINAL  Final  Urine culture     Status: None   Collection Time: 03/21/15  6:33 PM  Result Value Ref Range Status   Specimen Description URINE, CLEAN CATCH  Final   Special Requests NONE  Final   Culture MULTIPLE SPECIES PRESENT, SUGGEST RECOLLECTION  Final   Report Status 03/23/2015 FINAL  Final  Culture, blood (routine x 2)     Status: None   Collection Time: 03/21/15  7:40 PM  Result Value Ref Range Status   Specimen Description BLOOD LEFT HAND  Final   Special Requests IN PEDIATRIC BOTTLE 2CC  Final   Culture NO GROWTH 5 DAYS  Final   Report Status 03/26/2015 FINAL  Final     Studies:   Recent x-ray studies have been reviewed in detail by the Attending Physician  Scheduled Meds:  Scheduled Meds: . atorvastatin  40 mg Oral q1800  . buPROPion  150 mg Oral BID  . clopidogrel  75 mg Oral Daily  . DULoxetine  30  mg Oral Daily  . feeding supplement (GLUCERNA SHAKE)  237 mL Oral TID BM  . heparin  5,000 Units Subcutaneous 3 times per day  . hydrALAZINE  25 mg Oral 3 times per day  . insulin aspart  0-9 Units Subcutaneous TID WC  . insulin glargine  12 Units Subcutaneous BID  . isosorbide mononitrate  30 mg Oral Daily  . lipase/protease/amylase  3 capsule Oral TID AC  . magnesium oxide  400 mg Oral BID  . multivitamin with minerals  1 tablet Oral Daily  . olopatadine  1 drop Both Eyes BID  . pantoprazole  40 mg Oral Daily  . torsemide  20 mg Oral BID  . cyanocobalamin  1,000 mcg Oral Daily    Time spent on care of this patient: 25 mins   ELMAHI,MUTAZ A , MD   [facility]  Office  [phone] Pager - Text Page per Loretha Stapler as per below:  On-Call/Text Page:      [email or web address]      password [email or web address]  If 7PM-7AM, please contact night-coverage [email or web address] Password [facility] 03/28/2015, 11:57 AM   LOS: 7 days

## 2015-03-28 NOTE — Progress Notes (Signed)
Hypoglycemic Event  CBG: 68  Treatment: D50 IV 50 mL  Symptoms: Sweaty  Follow-up CBG: [id number] CBG Result: 166  Possible Reasons for Event: Inadequate meal intake  Comments/MD notified: Craige Cotta, K.    Herminio Heads L  Remember to initiate Hypoglycemia Order Set & complete

## 2015-03-28 NOTE — Clinical Social Work Note (Signed)
Clinical Social Work Assessment  Patient Details  Name: Christian Wall MRN: [id number] Date of Birth: 04/23/1961  Date of referral:  03/28/15               Reason for consult:  Facility Placement                Permission sought to share information with:  Oceanographer granted to share information::  Yes, Verbal Permission Granted  Name::        Agency::  [facility]  Relationship::     Contact Information:     Housing/Transportation Living arrangements for the past 2 months:  Single Family Home Source of Information:  Spouse Patient Interpreter Needed:  None Criminal Activity/Legal Involvement Pertinent to Current Situation/Hospitalization:  No - Comment as needed Significant Relationships:  Spouse Lives with:  Spouse Do you feel safe going back to the place where you live?  No (Concerns for fall risk) Need for family participation in patient care:  Yes (Comment)  Care giving concerns:  Pt needing increased assistance with mobility   Social Worker assessment / plan:  CSW visited pt room and discussed recommendations with pt wife. Pt wife informed CSW she is aware of need for rehab and in agreement. Pt wife is aware of limited facility options due to pt need for milrinone. Pt wife is agreeable to referral being sent to [facility]. Facility is already aware of pt and have provided a bed offer pending review of clinical information.   Employment status:    Insurance information:  Medicare PT Recommendations:  Skilled Holiday representative, 24 Hour Supervision Information / Referral to community resources:  Skilled Nursing Facility  Patient/Family's Response to care:  Pt wife in agreement with plan of care  Patient/Family's Understanding of and Emotional Response to Diagnosis, Current Treatment, and Prognosis:  Pt wife has good insight on pt condition. She is understanding of treatment and  needs. Pt wife with appropriate emotional response and stated she is ready for pt to get back to himself.   Emotional Assessment Appearance:  Appears stated age, Well-Groomed Attitude/Demeanor/Rapport:  Unable to Assess Affect (typically observed):  Unable to Assess Orientation:  Oriented to Self Alcohol / Substance use:  Not Applicable Psych involvement (Current and /or in the community):  No (Comment)  Discharge Needs  Concerns to be addressed:  Discharge Planning Concerns Readmission within the last 30 days:  Yes Current discharge risk:  Dependent with Mobility, Cognitively Impaired Barriers to Discharge:  Continued Medical Work up   [employer], LCSW [phone]

## 2015-03-28 NOTE — Plan of Care (Signed)
Problem: Phase I Progression Outcomes Goal: Initial discharge plan identified Outcome: Progressing Planning to go to SNF, however, patient sometimes unable to follow commands.

## 2015-03-29 ENCOUNTER — Encounter (HOSPITAL_COMMUNITY): Payer: Medicare Other

## 2015-03-29 LAB — RPR: RPR Ser Ql: NONREACTIVE

## 2015-03-29 LAB — GLUCOSE, CAPILLARY
GLUCOSE-CAPILLARY: 181 mg/dL — AB (ref 65–99)
Glucose-Capillary: 242 mg/dL — ABNORMAL HIGH (ref 65–99)
Glucose-Capillary: 274 mg/dL — ABNORMAL HIGH (ref 65–99)

## 2015-03-29 LAB — BASIC METABOLIC PANEL
Anion gap: 11 (ref 5–15)
BUN: 56 mg/dL — AB (ref 6–20)
CHLORIDE: 98 mmol/L — AB (ref 101–111)
CO2: 30 mmol/L (ref 22–32)
CREATININE: 2.94 mg/dL — AB (ref 0.61–1.24)
Calcium: 9.1 mg/dL (ref 8.9–10.3)
GFR, EST AFRICAN AMERICAN: 26 mL/min — AB (ref >60)
GFR, EST NON AFRICAN AMERICAN: 23 mL/min — AB (ref >60)
Glucose, Bld: 181 mg/dL — ABNORMAL HIGH (ref 65–99)
POTASSIUM: 5.1 mmol/L (ref 3.5–5.1)
SODIUM: 139 mmol/L (ref 135–145)

## 2015-03-29 LAB — CBC
HEMATOCRIT: 27.6 % — AB (ref 39.0–52.0)
Hemoglobin: 8.6 g/dL — ABNORMAL LOW (ref 13.0–17.0)
MCH: 20.9 pg — ABNORMAL LOW (ref 26.0–34.0)
MCHC: 31.2 g/dL (ref 30.0–36.0)
MCV: 67.2 fL — ABNORMAL LOW (ref 78.0–100.0)
PLATELETS: 299 10*3/uL (ref 150–400)
RBC: 4.11 MIL/uL — AB (ref 4.22–5.81)
RDW: 19.9 % — AB (ref 11.5–15.5)
WBC: 9 10*3/uL (ref 4.0–10.5)

## 2015-03-29 LAB — HIV ANTIBODY (ROUTINE TESTING W REFLEX): HIV SCREEN 4TH GENERATION: NONREACTIVE

## 2015-03-29 MED ORDER — MILRINONE IN DEXTROSE 20 MG/100ML IV SOLN: 0.3750 ug/kg/min | INTRAVENOUS | Status: DC

## 2015-03-29 NOTE — Discharge Summary (Signed)
Physician Discharge Summary  Christian Wall RSW:[id number] DOB: 1960-10-15 DOA: 03/21/2015  PCP: Dorrene German, MD  Admit date: 03/21/2015 Discharge date: 03/29/2015  Time spent: 40 minutes  Recommendations for Outpatient Follow-up:  1. Follow-up with primary care physician within one week.  Discharge Diagnoses:  Principal Problem:   Acute encephalopathy Active Problems:   Essential hypertension   Acute on chronic systolic CHF (congestive heart failure)   HLD (hyperlipidemia)   CKD (chronic kidney disease) stage 3, GFR 30-59 ml/min   Pulmonary hypertension   Systolic murmur   Right knee pain   Confusion   Goals of care, counseling/discussion   Discharge Condition: Stable  Diet recommendation: Heart healthy diet  Filed Weights   03/26/15 1608 03/28/15 0421 03/29/15 0318  Weight: 80.8 kg (178 lb 2.1 oz) 80.7 kg (177 lb 14.6 oz) 78.9 kg (173 lb 15.1 oz)    History of present illness:  Christian Wall is a 54 y.o. 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 He is being admitted for acute encephalopathy and acute on chronic systolic heart failure. He was just discharged from cardiology's service on 03/13/2015 after being treated for acute on chronic systolic heart failure. His wife said he was doing fairly well until Sunday when he began to become confused. His wife is extremely concerned. She states he is talking out of his head. She is unable to care for him at home in this state. His oxygen saturation was 86% on room air. His wife reports he is not on any new medications he did take 1 hydrocodone yesterday for knee pain. She reports that he does not drink or use any recreational drugs. He has never had any issues with liver disease. She reports that he is urinating less and it appears dark. His appetite has also dropped off. He had one episode of vomiting just after discharge last week.  Otherwise he has not had any fever, cough, chest pain, abdominal pain, dysuria, diarrhea, myalgias.  In the emergency department his WBC is within normal limits, BNP is 824, creatinine is 2.2 (elevated from previous but still within his baseline), BUN is slightly elevated. Chest x-ray shows bilateral airspace disease-pulmonary edema versus infection.  Hospital Course:   Acute encephalopathy -UDS negative -B12 normal/high - folate normal - TSH normal  -Head CT nondiagnostic -Possibly secondary to medications includingOxyIR, Lyrica, Zanaflex, and tramadol at home - holding all sedating medications  -?hepatic encephalopathy - doubt given low ammonia - ammonia level stable, lactulose was not started.  -Continue to hold sedating medications, patient improving and expect to continue improvement. -Will be discharged to nursing home. OxyIR, Zanaflex and Lyrica discontinued.  Chronic Systolic CHF (NICM), NYHA III  -Patient seen by a mass CHF team, on milrinone drip. -No changes in medications, continue torsemide 20 mg twice a day and Imdur 30 mg daily. -Appears euvolemic and compensated.  Essential HTN -BP well controlled   DM Type II -Over the last year patient's hemoglobin A1c has gone from 15.2--> 7.4 showing significant improvement. - CBGs well controlled   HLD -Continue Lipitor   Acute on chronic kidney failure stage III (baseline Cr 1.29-3.22) -Cr now within patient's baseline at 1.69.. -Worsening of creatinine from 1.7---->2.4, likely secondary to diuresis. -Continue diuresis per cardiology recommendation.  Peripheral neuropathy -Currently not complaining of symptoms  Depression -Continue Cymbalta 30 mg daily  Gout - Right knee pain  -9/14 Uric acid 14.8 -Currently not  complaining of pain - knee w/o color or erythema   Hypokalemia -f/u in AM  Wounds  -Has stage IV sacral decubitus ulcer present on admission. Please see WOC note team from 03/22/2015. -Wound Care  RN assistance requested  Tobacco Abuse  -Patient states stopped smoking ~2 months ago; encouraged to continue to not smoke.  Hyperkalemia -Slight hyperkalemia with potassium of 5.5, patient was getting potassium supplementation 40 mg twice a day. -This is discontinued earlier today.   Procedures:  None  Consultations:  Dense heart failure team  Discharge Exam: Filed Vitals:   03/29/15 0527  BP: 108/67  Pulse:   Temp:   Resp:    General: Alert and awake, oriented x2 not to dates, not in any acute distress. HEENT: anicteric sclera, pupils reactive to light and accommodation, EOMI CVS: S1-S2 clear, no murmur rubs or gallops Chest: clear to auscultation bilaterally, no wheezing, rales or rhonchi Abdomen: soft nontender, nondistended, normal bowel sounds, no organomegaly Extremities: no cyanosis, clubbing or edema noted bilaterally Neuro: Cranial nerves II-XII intact, no focal neurological deficits   Discharge Instructions   Discharge Instructions    Diet - low sodium heart healthy    Complete by:  As directed      Increase activity slowly    Complete by:  As directed           Current Discharge Medication List    CONTINUE these medications which have NOT CHANGED   Details  acetaminophen (TYLENOL) 500 MG tablet Take 2 tablets (1,000 mg total) by mouth every 6 (six) hours as needed for mild pain or headache. Qty: 30 tablet, Refills: 0    atorvastatin (LIPITOR) 40 MG tablet Take 1 tablet (40 mg total) by mouth daily at 6 PM. Qty: 30 tablet, Refills: 6    bisacodyl (DULCOLAX) 5 MG EC tablet Take 5 mg by mouth daily as needed for mild constipation or moderate constipation.    buPROPion (WELLBUTRIN SR) 150 MG 12 hr tablet Take 150 mg by mouth 2 (two) times daily. Refills: 3    clopidogrel (PLAVIX) 75 MG tablet Take 1 tablet (75 mg total) by mouth daily. Qty: 30 tablet, Refills: 6    [employer] VITAMIN B12 1000 MCG tablet Take 1,000 mcg by mouth daily. Refills: 5     DULoxetine (CYMBALTA) 30 MG capsule Take 1 capsule (30 mg total) by mouth daily. Qty: 30 capsule, Refills: 0    esomeprazole (NEXIUM) 40 MG capsule Take 40 mg by mouth daily before breakfast.    ferrous sulfate 325 (65 FE) MG EC tablet Take 1 tablet (325 mg total) by mouth daily with breakfast. Qty: 30 tablet, Refills: 6    hydrALAZINE (APRESOLINE) 25 MG tablet Take 1 tablet (25 mg total) by mouth every 8 (eight) hours. Qty: 270 tablet, Refills: 11    insulin aspart (NOVOLOG) 100 UNIT/ML injection Inject 0-15 Units into the skin 3 (three) times daily with meals. CBG 70 - 120: 0 units CBG 121 - 150: 2 units CBG 151 - 200: 3 units CBG 201 - 250: 5 units CBG 251 - 300: 8 units CBG 301 - 350: 11 units CBG 351 - 400: 15 units Qty: 10 mL, Refills: 11    insulin glargine (LANTUS) 100 UNIT/ML injection Inject 0.2 mLs (20 Units total) into the skin 2 (two) times daily. Qty: 10 mL, Refills: 11    isosorbide mononitrate (IMDUR) 30 MG 24 hr tablet Take 1 tablet (30 mg total) by mouth daily. Qty: 30 tablet, Refills:  0    lipase/protease/amylase (CREON-10/PANCREASE) 12000 UNITS CPEP Take 1-3 capsules by mouth 2 (two) times daily. 3 capsules before meal, and 1 capsule before snack    magnesium oxide (MAG-OX) 400 (241.3 MG) MG tablet Take 400 mg by mouth 2 (two) times daily. Refills: 3    milrinone (PRIMACOR) 20 MG/100ML SOLN infusion Inject 31.8375 mcg/min into the vein continuous. Per [facility]. 12 months Qty: 100 mL, Refills: 12    Olopatadine HCl (PATADAY) 0.2 % SOLN Apply 1 drop to eye daily as needed. For allergies    torsemide (DEMADEX) 20 MG tablet Take 1 tablet (20 mg total) by mouth 2 (two) times daily. Start the 20 mg twice a day. Start 9/6 at 6 pm Qty: 60 tablet, Refills: 6    VENTOLIN HFA 108 (90 BASE) MCG/ACT inhaler Inhale 1-2 puffs into the lungs every 6 (six) hours as needed for wheezing or shortness of breath.  Refills: 5      STOP taking these medications     oxyCODONE  (OXY IR/ROXICODONE) 5 MG immediate release tablet      pregabalin (LYRICA) 150 MG capsule      tiZANidine (ZANAFLEX) 4 MG tablet        No Known Allergies Follow-up Information    Follow up with Arvilla Meres, MD On 04/04/2015.   Specialty:  Cardiology   Why:  at 11:00 am in the [facility]--gate code 0090--please bring all medications to appt   Contact information:   [address] [place] [address] [phone]        The results of significant diagnostics from this hospitalization (including imaging, microbiology, ancillary and laboratory) are listed below for reference.    Significant Diagnostic Studies: Dg Chest 1 View  03/27/2015   CLINICAL DATA:  Status post fall, evaluate PICC  EXAM: CHEST  1 VIEW  COMPARISON:  03/22/2015  FINDINGS: Cardiomegaly.  Patchy/interstitial opacities, upper lobe predominant, mildly improved. Differential considerations include multifocal pneumonia versus mild interstitial edema.  No pleural effusion or pneumothorax.  Right arm PICC terminates in the lower SVC.  IMPRESSION: Right arm PICC terminates in the lower SVC.  Multifocal patchy opacities, upper lobe predominant, mildly improved. Differential considerations include multifocal pneumonia (favored) versus mild interstitial edema.   Electronically Signed   By: Charline Bills M.D.   On: 03/27/2015 06:31   Dg Chest 2 View  03/22/2015   CLINICAL DATA:  Shortness of breath and weakness.  EXAM: CHEST  2 VIEW  COMPARISON:  03/21/2015  FINDINGS: Right-sided PICC line is in place, tip overlying the superior vena cava.  The heart is enlarged. There are diffuse airspace filling opacities bilaterally, consistent with edema or infectious process. The appearance is largely stable. No definite pleural effusions. There are chronic changes in the left shoulder possibly posttraumatic.  IMPRESSION: Stable cardiomegaly and diffuse airspace filling opacities.   Electronically  Signed   By: Norva Pavlov M.D.   On: 03/22/2015 10:11   Dg Chest 2 View  03/21/2015   CLINICAL DATA:  Shortness of breath.  EXAM: CHEST  2 VIEW  COMPARISON:  03/11/2015  FINDINGS: Right PICC line is in place with the tip in the SVC, stable. Cardiomegaly with bilateral airspace opacities, increased since prior study. No effusions. No acute bony abnormality.  IMPRESSION: Worsening diffuse bilateral airspace disease, which could represent edema or infection. Favor edema.   Electronically Signed   By: Charlett Nose M.D.   On: 03/21/2015 15:24   Dg  Chest 2 View  03/11/2015   CLINICAL DATA:  Acute shortness of breath.  History of CHF.  EXAM: CHEST  2 VIEW  COMPARISON:  02/22/2015 and prior exams  FINDINGS: Cardiomegaly and mild interstitial pulmonary edema noted, increased from the prior study.  A right PICC line is noted with tip overlying the mid mid-upper SVC.  There is no evidence of pneumothorax or pleural effusion.  No acute bony abnormalities are noted.  IMPRESSION: Cardiomegaly with mild-moderate interstitial pulmonary edema.   Electronically Signed   By: Harmon Pier M.D.   On: 03/11/2015 08:48   Ct Head Wo Contrast  03/27/2015   CLINICAL DATA:  Fall out of bed with closed-head injury  EXAM: CT HEAD WITHOUT CONTRAST  TECHNIQUE: Contiguous axial images were obtained from the base of the skull through the vertex without intravenous contrast.  COMPARISON:  03/21/2015  FINDINGS: Bony calvarium is intact with the exception of a previous right frontal craniotomy. Mild atrophic changes are seen. Areas of encephalomalacia in the right frontal lobe are noted related to the prior surgery. No findings to suggest acute hemorrhage, acute infarction or space-occupying mass lesion are noted.  IMPRESSION: Mild atrophic changes.  No acute abnormality noted.   Electronically Signed   By: Alcide Clever M.D.   On: 03/27/2015 08:27   Ct Head Wo Contrast  03/22/2015   CLINICAL DATA:  Confusion. History of hypertension,  diabetes, acute encephalopathy, acute renal failure.  EXAM: CT HEAD WITHOUT CONTRAST  TECHNIQUE: Contiguous axial images were obtained from the base of the skull through the vertex without intravenous contrast.  COMPARISON:  None available for comparison at time of study interpretation.  FINDINGS: The ventricles and sulci are upper limits of normal for age. No intraparenchymal hemorrhage, mass effect nor midline shift. No acute large vascular territory infarcts. Small area RIGHT frontal encephalomalacia.  No abnormal extra-axial fluid collections. Basal cisterns are patent. Mild calcific atherosclerosis of the carotid siphons.  No skull fracture. Old RIGHT frontal craniotomy. The included ocular globes and orbital contents are non-suspicious. The mastoid aircells and included paranasal sinuses are well-aerated.  IMPRESSION: 1.  No acute intracranial process.  2.  Old RIGHT frontal craniotomy, RIGHT frontal encephalomalacia.  3. Mild global parenchymal brain volume loss for age.   Electronically Signed   By: Awilda Metro M.D.   On: 03/22/2015 00:53    Microbiology: Recent Results (from the past 240 hour(s))  Culture, blood (routine x 2)     Status: None   Collection Time: 03/21/15  5:44 PM  Result Value Ref Range Status   Specimen Description BLOOD LEFT ARM  Final   Special Requests BOTTLES DRAWN AEROBIC AND ANAEROBIC 10CC  Final   Culture NO GROWTH 5 DAYS  Final   Report Status 03/26/2015 FINAL  Final  Urine culture     Status: None   Collection Time: 03/21/15  6:33 PM  Result Value Ref Range Status   Specimen Description URINE, CLEAN CATCH  Final   Special Requests NONE  Final   Culture MULTIPLE SPECIES PRESENT, SUGGEST RECOLLECTION  Final   Report Status 03/23/2015 FINAL  Final  Culture, blood (routine x 2)     Status: None   Collection Time: 03/21/15  7:40 PM  Result Value Ref Range Status   Specimen Description BLOOD LEFT HAND  Final   Special Requests IN PEDIATRIC BOTTLE [facility]  Final    Culture NO GROWTH 5 DAYS  Final   Report Status 03/26/2015 FINAL  Final  Labs: Basic Metabolic Panel:  Recent Labs Lab 03/24/15 0311 03/25/15 2307 03/27/15 1523 03/28/15 0435 03/29/15 0455  NA 137 137 141 141 139  K 3.6 3.7 5.3* 5.5* 5.1  CL 91* 91* 98* 99* 98*  CO2 36* 34* 33* 31 30  GLUCOSE 172* 162* 112* 74 181*  BUN 44* 39* 45* 49* 56*  CREATININE 1.54* 1.69* 2.17* 2.48* 2.94*  CALCIUM 8.9 8.8* 9.5 9.7 9.1   Liver Function Tests:  Recent Labs Lab 03/24/15 0311  AST 22  ALT 16*  ALKPHOS 331*  BILITOT 2.0*  PROT 6.8  ALBUMIN 2.3*   No results for input(s): LIPASE, AMYLASE in the last 168 hours.  Recent Labs Lab 03/24/15 0430  AMMONIA 27   CBC:  Recent Labs Lab 03/24/15 0311 03/29/15 0455  WBC 8.5 9.0  HGB 9.0* 8.6*  HCT 29.3* 27.6*  MCV 68.9* 67.2*  PLT 271 299   Cardiac Enzymes: No results for input(s): CKTOTAL, CKMB, CKMBINDEX, TROPONINI in the last 168 hours. BNP: BNP (last 3 results)  Recent Labs  02/21/15 1415 03/11/15 1702 03/21/15 1200  BNP 1554.1* 1184.5* 824.5*    ProBNP (last 3 results) No results for input(s): PROBNP in the last 8760 hours.  CBG:  Recent Labs Lab 03/28/15 0650 03/28/15 1102 03/28/15 1605 03/28/15 2114 03/29/15 0625  GLUCAP 166* 123* 87 141* 181*       Signed:  ELMAHI,MUTAZ A  [facility] 03/29/2015, 10:33 AM

## 2015-03-29 NOTE — Progress Notes (Signed)
Physical Therapy Treatment Patient Details Name: Christian Wall MRN: [id number] DOB: [birth year] Today's Date: 03/29/2015    History of Present Illness 54 year old gentleman with prior h/o chronic systolic heart failure on milrinone drip, polysubstance abuse, DM, peripheral neuropathy, old right frontal craniotomy; recently discharged from cardiology service on 9/6, presented with worsening sob and confusion and a recent fall, elevated ammonia on arrival    PT Comments    Pt pleasant and confused who is able to continue standing trials today and limited gait. He remains limited by confusion and bil LE weakness. Pt educated for HEP and encouraged to perform as well as OOB to chair daily with nursing. Will continue to follow.   Follow Up Recommendations  Supervision/Assistance - 24 hour;SNF     Equipment Recommendations       Recommendations for Other Services       Precautions / Restrictions Precautions Precautions: Fall    Mobility  Bed Mobility Overal bed mobility: Needs Assistance       Supine to sit: Min guard     General bed mobility comments: cues for sequence with assist for rail and pt able to complete without physical assist  Transfers Overall transfer level: Needs assistance   Transfers: Sit to/from Stand Sit to Stand: Min assist Stand pivot transfers: Min assist;+2 safety/equipment       General transfer comment: cues for hand placement, sequence and safety with pt pulling up on RW x 2 with 4 trials total and pt pivoted bed to bSC with total assist for pericare after BM  Ambulation/Gait Ambulation/Gait assistance: Min assist;+2 safety/equipment Ambulation Distance (Feet): 6 Feet Assistive device: Rolling walker (2 wheeled) Gait Pattern/deviations: Shuffle;Narrow base of support   Gait velocity interpretation: Below normal speed for age/gender General Gait Details: pt on bil toes, narrow BOS, utilizing knees locked and extensive bil UE strength to  progress feet, chair followed with pt fatigued after transfers and 6' of gait   Stairs            Wheelchair Mobility    Modified Rankin (Stroke Patients Only)       Balance Overall balance assessment: Needs assistance   Sitting balance-Leahy Scale: Fair       Standing balance-Leahy Scale: Poor                      Cognition Arousal/Alertness: Awake/alert Behavior During Therapy: Flat affect Overall Cognitive Status: Impaired/Different from baseline   Orientation Level: Disoriented to;Situation;Time Current Attention Level: Sustained Memory: Decreased short-term memory Following Commands: Follows one step commands consistently;Follows one step commands with increased time Safety/Judgement: Decreased awareness of safety;Decreased awareness of deficits   Problem Solving: Slow processing General Comments: pt still with intermittent confusion stating it is 2002, unable to answer questions consistently and requires repeated cues before initiation    Exercises General Exercises - Lower Extremity Short Arc Quad: AROM;AAROM;Right;Left;Seated;15 reps (AAROM on RLE) Hip Flexion/Marching: AROM;AAROM;Right;Left;Seated;15 reps (AAROM on RLE)    General Comments        Pertinent Vitals/Pain Pain Assessment: No/denies pain    Home Living                      Prior Function            PT Goals (current goals can now be found in the care plan section) Progress towards PT goals: Progressing toward goals    Frequency       PT Plan Current plan remains  appropriate    Co-evaluation             End of Session Equipment Utilized During Treatment: Gait belt Activity Tolerance: Patient limited by fatigue Patient left: in chair;with call bell/phone within reach;with chair alarm set     Time: [phone] PT Time Calculation (min) (ACUTE ONLY): 24 min  Charges:  $Therapeutic Exercise: 8-22 mins $Therapeutic Activity: 8-22 mins                     G Codes:      Delorse Lek 2015-04-09, 11:04 AM  Delaney Meigs, PT [phone]

## 2015-03-29 NOTE — Progress Notes (Signed)
SNF bed available for patient at [facility].  Awaiting DC (stability) per MD.  Patient is aware and agreeable to SNF.  Lorri Frederick. Jaci Lazier, [place] [phone]

## 2015-04-02 ENCOUNTER — Inpatient Hospital Stay (HOSPITAL_COMMUNITY): Payer: Medicare Other

## 2015-04-02 ENCOUNTER — Encounter: Payer: Self-pay | Admitting: Vascular Surgery

## 2015-04-02 ENCOUNTER — Emergency Department (HOSPITAL_COMMUNITY): Payer: Medicare Other

## 2015-04-02 ENCOUNTER — Encounter (HOSPITAL_COMMUNITY): Payer: Self-pay | Admitting: Emergency Medicine

## 2015-04-02 ENCOUNTER — Inpatient Hospital Stay (HOSPITAL_COMMUNITY)
Admission: EM | Admit: 2015-04-02 | Discharge: 2015-04-11 | DRG: 314 | Disposition: A | Discharge status: Other Acute Inpatient Hospital | Payer: Medicare Other | Attending: Pulmonary Disease | Admitting: Pulmonary Disease

## 2015-04-02 DIAGNOSIS — I739 Peripheral vascular disease, unspecified: Secondary | ICD-10-CM | POA: Diagnosis present

## 2015-04-02 DIAGNOSIS — I5022 Chronic systolic (congestive) heart failure: Secondary | ICD-10-CM | POA: Diagnosis not present

## 2015-04-02 DIAGNOSIS — R066 Hiccough: Secondary | ICD-10-CM | POA: Diagnosis present

## 2015-04-02 DIAGNOSIS — E872 Acidosis: Secondary | ICD-10-CM | POA: Diagnosis present

## 2015-04-02 DIAGNOSIS — Z515 Encounter for palliative care: Secondary | ICD-10-CM | POA: Diagnosis not present

## 2015-04-02 DIAGNOSIS — T82524A Displacement of infusion catheter, initial encounter: Secondary | ICD-10-CM | POA: Diagnosis present

## 2015-04-02 DIAGNOSIS — R6521 Severe sepsis with septic shock: Secondary | ICD-10-CM | POA: Diagnosis not present

## 2015-04-02 DIAGNOSIS — E876 Hypokalemia: Secondary | ICD-10-CM | POA: Diagnosis not present

## 2015-04-02 DIAGNOSIS — I270 Primary pulmonary hypertension: Secondary | ICD-10-CM | POA: Diagnosis not present

## 2015-04-02 DIAGNOSIS — J9690 Respiratory failure, unspecified, unspecified whether with hypoxia or hypercapnia: Secondary | ICD-10-CM

## 2015-04-02 DIAGNOSIS — Z79899 Other long term (current) drug therapy: Secondary | ICD-10-CM | POA: Diagnosis not present

## 2015-04-02 DIAGNOSIS — E1122 Type 2 diabetes mellitus with diabetic chronic kidney disease: Secondary | ICD-10-CM | POA: Diagnosis present

## 2015-04-02 DIAGNOSIS — F17210 Nicotine dependence, cigarettes, uncomplicated: Secondary | ICD-10-CM | POA: Diagnosis present

## 2015-04-02 DIAGNOSIS — I509 Heart failure, unspecified: Secondary | ICD-10-CM

## 2015-04-02 DIAGNOSIS — R748 Abnormal levels of other serum enzymes: Secondary | ICD-10-CM | POA: Diagnosis not present

## 2015-04-02 DIAGNOSIS — N179 Acute kidney failure, unspecified: Secondary | ICD-10-CM | POA: Diagnosis present

## 2015-04-02 DIAGNOSIS — J9601 Acute respiratory failure with hypoxia: Secondary | ICD-10-CM | POA: Diagnosis present

## 2015-04-02 DIAGNOSIS — Z981 Arthrodesis status: Secondary | ICD-10-CM | POA: Diagnosis not present

## 2015-04-02 DIAGNOSIS — R410 Disorientation, unspecified: Secondary | ICD-10-CM | POA: Diagnosis not present

## 2015-04-02 DIAGNOSIS — N184 Chronic kidney disease, stage 4 (severe): Secondary | ICD-10-CM | POA: Diagnosis present

## 2015-04-02 DIAGNOSIS — E11649 Type 2 diabetes mellitus with hypoglycemia without coma: Secondary | ICD-10-CM | POA: Diagnosis present

## 2015-04-02 DIAGNOSIS — F05 Delirium due to known physiological condition: Secondary | ICD-10-CM | POA: Diagnosis not present

## 2015-04-02 DIAGNOSIS — E1121 Type 2 diabetes mellitus with diabetic nephropathy: Secondary | ICD-10-CM | POA: Diagnosis present

## 2015-04-02 DIAGNOSIS — G629 Polyneuropathy, unspecified: Secondary | ICD-10-CM

## 2015-04-02 DIAGNOSIS — Z720 Tobacco use: Secondary | ICD-10-CM | POA: Diagnosis not present

## 2015-04-02 DIAGNOSIS — I952 Hypotension due to drugs: Secondary | ICD-10-CM

## 2015-04-02 DIAGNOSIS — K7460 Unspecified cirrhosis of liver: Secondary | ICD-10-CM | POA: Diagnosis present

## 2015-04-02 DIAGNOSIS — D638 Anemia in other chronic diseases classified elsewhere: Secondary | ICD-10-CM

## 2015-04-02 DIAGNOSIS — R740 Nonspecific elevation of levels of transaminase and lactic acid dehydrogenase [LDH]: Secondary | ICD-10-CM

## 2015-04-02 DIAGNOSIS — L89154 Pressure ulcer of sacral region, stage 4: Secondary | ICD-10-CM | POA: Diagnosis present

## 2015-04-02 DIAGNOSIS — J189 Pneumonia, unspecified organism: Secondary | ICD-10-CM | POA: Diagnosis not present

## 2015-04-02 DIAGNOSIS — Y95 Nosocomial condition: Secondary | ICD-10-CM | POA: Diagnosis not present

## 2015-04-02 DIAGNOSIS — N183 Chronic kidney disease, stage 3 unspecified: Secondary | ICD-10-CM | POA: Diagnosis present

## 2015-04-02 DIAGNOSIS — I272 Other secondary pulmonary hypertension: Secondary | ICD-10-CM | POA: Diagnosis present

## 2015-04-02 DIAGNOSIS — R1310 Dysphagia, unspecified: Secondary | ICD-10-CM | POA: Diagnosis present

## 2015-04-02 DIAGNOSIS — E0821 Diabetes mellitus due to underlying condition with diabetic nephropathy: Secondary | ICD-10-CM

## 2015-04-02 DIAGNOSIS — R339 Retention of urine, unspecified: Secondary | ICD-10-CM | POA: Diagnosis present

## 2015-04-02 DIAGNOSIS — I959 Hypotension, unspecified: Secondary | ICD-10-CM | POA: Diagnosis present

## 2015-04-02 DIAGNOSIS — R0902 Hypoxemia: Secondary | ICD-10-CM | POA: Insufficient documentation

## 2015-04-02 DIAGNOSIS — A419 Sepsis, unspecified organism: Secondary | ICD-10-CM | POA: Diagnosis not present

## 2015-04-02 DIAGNOSIS — A4151 Sepsis due to Escherichia coli [E. coli]: Secondary | ICD-10-CM | POA: Diagnosis not present

## 2015-04-02 DIAGNOSIS — G9341 Metabolic encephalopathy: Secondary | ICD-10-CM | POA: Diagnosis not present

## 2015-04-02 DIAGNOSIS — Z7902 Long term (current) use of antithrombotics/antiplatelets: Secondary | ICD-10-CM

## 2015-04-02 DIAGNOSIS — Z7401 Bed confinement status: Secondary | ICD-10-CM

## 2015-04-02 DIAGNOSIS — R579 Shock, unspecified: Secondary | ICD-10-CM | POA: Diagnosis not present

## 2015-04-02 DIAGNOSIS — IMO0002 Reserved for concepts with insufficient information to code with codable children: Secondary | ICD-10-CM | POA: Diagnosis present

## 2015-04-02 DIAGNOSIS — R7881 Bacteremia: Secondary | ICD-10-CM | POA: Diagnosis not present

## 2015-04-02 DIAGNOSIS — K219 Gastro-esophageal reflux disease without esophagitis: Secondary | ICD-10-CM | POA: Diagnosis present

## 2015-04-02 DIAGNOSIS — Z794 Long term (current) use of insulin: Secondary | ICD-10-CM

## 2015-04-02 DIAGNOSIS — R509 Fever, unspecified: Secondary | ICD-10-CM | POA: Diagnosis not present

## 2015-04-02 DIAGNOSIS — F329 Major depressive disorder, single episode, unspecified: Secondary | ICD-10-CM | POA: Diagnosis present

## 2015-04-02 DIAGNOSIS — E1165 Type 2 diabetes mellitus with hyperglycemia: Secondary | ICD-10-CM | POA: Diagnosis not present

## 2015-04-02 DIAGNOSIS — E1142 Type 2 diabetes mellitus with diabetic polyneuropathy: Secondary | ICD-10-CM | POA: Diagnosis present

## 2015-04-02 DIAGNOSIS — J9600 Acute respiratory failure, unspecified whether with hypoxia or hypercapnia: Secondary | ICD-10-CM | POA: Diagnosis present

## 2015-04-02 DIAGNOSIS — Y838 Other surgical procedures as the cause of abnormal reaction of the patient, or of later complication, without mention of misadventure at the time of the procedure: Secondary | ICD-10-CM | POA: Diagnosis present

## 2015-04-02 DIAGNOSIS — R570 Cardiogenic shock: Secondary | ICD-10-CM | POA: Diagnosis not present

## 2015-04-02 DIAGNOSIS — R7401 Elevation of levels of liver transaminase levels: Secondary | ICD-10-CM

## 2015-04-02 DIAGNOSIS — I5023 Acute on chronic systolic (congestive) heart failure: Secondary | ICD-10-CM | POA: Diagnosis not present

## 2015-04-02 DIAGNOSIS — R1110 Vomiting, unspecified: Secondary | ICD-10-CM

## 2015-04-02 DIAGNOSIS — R4182 Altered mental status, unspecified: Secondary | ICD-10-CM

## 2015-04-02 DIAGNOSIS — Z4659 Encounter for fitting and adjustment of other gastrointestinal appliance and device: Secondary | ICD-10-CM

## 2015-04-02 LAB — I-STAT CG4 LACTIC ACID, ED
LACTIC ACID, VENOUS: 1.46 mmol/L (ref 0.5–2.0)
Lactic Acid, Venous: 5.56 mmol/L (ref 0.5–2.0)
Lactic Acid, Venous: 4.63 mmol/L (ref 0.5–2.0)

## 2015-04-02 LAB — URINALYSIS, ROUTINE W REFLEX MICROSCOPIC
GLUCOSE, UA: NEGATIVE mg/dL
HGB URINE DIPSTICK: NEGATIVE
Ketones, ur: NEGATIVE mg/dL
Nitrite: NEGATIVE
PROTEIN: NEGATIVE mg/dL
Specific Gravity, Urine: 1.02 (ref 1.005–1.030)
Urobilinogen, UA: 1 mg/dL (ref 0.0–1.0)
pH: 5 (ref 5.0–8.0)

## 2015-04-02 LAB — CBC WITH DIFFERENTIAL/PLATELET
Basophils Absolute: 0 10*3/uL (ref 0.0–0.1)
Basophils Relative: 0 %
EOS PCT: 0 %
Eosinophils Absolute: 0 10*3/uL (ref 0.0–0.7)
HCT: 36.4 % — ABNORMAL LOW (ref 39.0–52.0)
HEMOGLOBIN: 11.4 g/dL — AB (ref 13.0–17.0)
LYMPHS PCT: 20 %
Lymphs Abs: 1.3 10*3/uL (ref 0.7–4.0)
MCH: 21 pg — ABNORMAL LOW (ref 26.0–34.0)
MCHC: 31.3 g/dL (ref 30.0–36.0)
MCV: 67.2 fL — ABNORMAL LOW (ref 78.0–100.0)
MONOS PCT: 9 %
Monocytes Absolute: 0.6 10*3/uL (ref 0.1–1.0)
NEUTROS ABS: 4.8 10*3/uL (ref 1.7–7.7)
Neutrophils Relative %: 71 %
Platelets: 329 10*3/uL (ref 150–400)
RBC: 5.42 MIL/uL (ref 4.22–5.81)
RDW: 20.3 % — ABNORMAL HIGH (ref 11.5–15.5)
WBC: 6.7 10*3/uL (ref 4.0–10.5)

## 2015-04-02 LAB — COMPREHENSIVE METABOLIC PANEL
ALBUMIN: 3.2 g/dL — AB (ref 3.5–5.0)
ALK PHOS: 857 U/L — AB (ref 38–126)
ALT: 81 U/L — AB (ref 17–63)
AST: 105 U/L — ABNORMAL HIGH (ref 15–41)
Anion gap: 15 (ref 5–15)
BUN: 64 mg/dL — AB (ref 6–20)
CALCIUM: 9.2 mg/dL (ref 8.9–10.3)
CO2: 24 mmol/L (ref 22–32)
CREATININE: 2.31 mg/dL — AB (ref 0.61–1.24)
Chloride: 98 mmol/L — ABNORMAL LOW (ref 101–111)
GFR calc Af Amer: 35 mL/min — ABNORMAL LOW (ref >60)
GFR calc non Af Amer: 31 mL/min — ABNORMAL LOW (ref >60)
GLUCOSE: 23 mg/dL — AB (ref 65–99)
Potassium: 4.4 mmol/L (ref 3.5–5.1)
SODIUM: 137 mmol/L (ref 135–145)
Total Bilirubin: 3.4 mg/dL — ABNORMAL HIGH (ref 0.3–1.2)
Total Protein: 8.8 g/dL — ABNORMAL HIGH (ref 6.5–8.1)

## 2015-04-02 LAB — URINE MICROSCOPIC-ADD ON

## 2015-04-02 LAB — I-STAT TROPONIN, ED
Troponin i, poc: 0.08 ng/mL (ref 0.00–0.08)
Troponin i, poc: 0.07 ng/mL (ref 0.00–0.08)
Troponin i, poc: 0.06 ng/mL (ref 0.00–0.08)

## 2015-04-02 LAB — CBG MONITORING, ED
GLUCOSE-CAPILLARY: 17 mg/dL — AB (ref 65–99)
GLUCOSE-CAPILLARY: 76 mg/dL (ref 65–99)
Glucose-Capillary: 85 mg/dL (ref 65–99)
Glucose-Capillary: 105 mg/dL — ABNORMAL HIGH (ref 65–99)

## 2015-04-02 LAB — MRSA PCR SCREENING: MRSA BY PCR: NEGATIVE

## 2015-04-02 LAB — BRAIN NATRIURETIC PEPTIDE: B Natriuretic Peptide: 1932 pg/mL — ABNORMAL HIGH (ref 0.0–100.0)

## 2015-04-02 LAB — GLUCOSE, CAPILLARY: Glucose-Capillary: 104 mg/dL — ABNORMAL HIGH (ref 65–99)

## 2015-04-02 MED ORDER — BISACODYL 5 MG PO TBEC
5.0000 mg | DELAYED_RELEASE_TABLET | Freq: Every day | ORAL | Status: DC | PRN
  Filled 2015-04-02: qty 1

## 2015-04-02 MED ORDER — ALBUTEROL SULFATE (2.5 MG/3ML) 0.083% IN NEBU: 2.5000 mg | INHALATION_SOLUTION | RESPIRATORY_TRACT | Status: DC | PRN

## 2015-04-02 MED ORDER — ONDANSETRON HCL 4 MG/2ML IJ SOLN
4.0000 mg | Freq: Four times a day (QID) | INTRAMUSCULAR | Status: DC | PRN
  Administered 2015-04-02 – 2015-04-10 (×3): 4 mg via INTRAVENOUS
  Filled 2015-04-02 (×3): qty 2

## 2015-04-02 MED ORDER — DULOXETINE HCL 30 MG PO CPEP
30.0000 mg | ORAL_CAPSULE | Freq: Every day | ORAL | Status: DC
  Administered 2015-04-02 – 2015-04-04 (×3): 30 mg via ORAL
  Filled 2015-04-02 (×5): qty 1

## 2015-04-02 MED ORDER — FERROUS SULFATE 325 (65 FE) MG PO TABS
325.0000 mg | ORAL_TABLET | Freq: Every day | ORAL | Status: DC
  Administered 2015-04-03 – 2015-04-11 (×4): 325 mg via ORAL
  Filled 2015-04-02 (×11): qty 1

## 2015-04-02 MED ORDER — PANTOPRAZOLE SODIUM 40 MG PO TBEC
40.0000 mg | DELAYED_RELEASE_TABLET | Freq: Every day | ORAL | Status: DC
  Administered 2015-04-03: 40 mg via ORAL
  Filled 2015-04-02: qty 1

## 2015-04-02 MED ORDER — TORSEMIDE 20 MG PO TABS
20.0000 mg | ORAL_TABLET | Freq: Two times a day (BID) | ORAL | Status: DC
  Administered 2015-04-02 – 2015-04-04 (×4): 20 mg via ORAL
  Filled 2015-04-02 (×14): qty 1

## 2015-04-02 MED ORDER — INSULIN GLARGINE 100 UNIT/ML ~~LOC~~ SOLN
20.0000 [IU] | Freq: Two times a day (BID) | SUBCUTANEOUS | Status: DC
  Administered 2015-04-02 – 2015-04-05 (×5): 20 [IU] via SUBCUTANEOUS
  Filled 2015-04-02 (×9): qty 0.2

## 2015-04-02 MED ORDER — CLOPIDOGREL BISULFATE 75 MG PO TABS
75.0000 mg | ORAL_TABLET | Freq: Every day | ORAL | Status: DC
  Administered 2015-04-02 – 2015-04-11 (×6): 75 mg via ORAL
  Filled 2015-04-02 (×11): qty 1

## 2015-04-02 MED ORDER — SODIUM CHLORIDE 0.9 % IV BOLUS (SEPSIS)
500.0000 mL | Freq: Once | INTRAVENOUS | Status: AC
  Administered 2015-04-02: 500 mL via INTRAVENOUS

## 2015-04-02 MED ORDER — PANCRELIPASE (LIP-PROT-AMYL) 12000-38000 UNITS PO CPEP
36000.0000 [IU] | ORAL_CAPSULE | Freq: Three times a day (TID) | ORAL | Status: DC
  Administered 2015-04-03 – 2015-04-05 (×6): 36000 [IU] via ORAL
  Filled 2015-04-02 (×12): qty 3

## 2015-04-02 MED ORDER — ATORVASTATIN CALCIUM 40 MG PO TABS
40.0000 mg | ORAL_TABLET | Freq: Every day | ORAL | Status: DC
  Administered 2015-04-03 – 2015-04-04 (×2): 40 mg via ORAL
  Filled 2015-04-02 (×2): qty 1

## 2015-04-02 MED ORDER — OLOPATADINE HCL 0.1 % OP SOLN
1.0000 [drp] | Freq: Two times a day (BID) | OPHTHALMIC | Status: DC
  Administered 2015-04-02 – 2015-04-11 (×17): 1 [drp] via OPHTHALMIC
  Filled 2015-04-02 (×3): qty 5

## 2015-04-02 MED ORDER — DEXTROSE 50 % IV SOLN
INTRAVENOUS | Status: AC
Start: 2015-04-02 — End: 2015-04-02
  Administered 2015-04-02: 50 mL via INTRAVENOUS
  Filled 2015-04-02: qty 50

## 2015-04-02 MED ORDER — MILRINONE IN DEXTROSE 20 MG/100ML IV SOLN
0.3750 ug/kg/min | INTRAVENOUS | Status: DC
  Administered 2015-04-02 – 2015-04-05 (×8): 0.375 ug/kg/min via INTRAVENOUS
  Filled 2015-04-02 (×9): qty 100

## 2015-04-02 MED ORDER — ENOXAPARIN SODIUM 40 MG/0.4ML ~~LOC~~ SOLN
40.0000 mg | SUBCUTANEOUS | Status: DC
  Administered 2015-04-02: 40 mg via SUBCUTANEOUS
  Filled 2015-04-02 (×2): qty 0.4

## 2015-04-02 MED ORDER — BUPROPION HCL ER (SR) 150 MG PO TB12
150.0000 mg | ORAL_TABLET | Freq: Two times a day (BID) | ORAL | Status: DC
  Administered 2015-04-02 – 2015-04-03 (×3): 150 mg via ORAL
  Filled 2015-04-02 (×6): qty 1

## 2015-04-02 MED ORDER — INSULIN ASPART 100 UNIT/ML ~~LOC~~ SOLN
0.0000 [IU] | Freq: Three times a day (TID) | SUBCUTANEOUS | Status: DC
  Administered 2015-04-03: 2 [IU] via SUBCUTANEOUS
  Administered 2015-04-04: 1 [IU] via SUBCUTANEOUS
  Administered 2015-04-04: 2 [IU] via SUBCUTANEOUS
  Administered 2015-04-04 – 2015-04-05 (×2): 1 [IU] via SUBCUTANEOUS

## 2015-04-02 MED ORDER — DEXTROSE 50 % IV SOLN
50.0000 mL | Freq: Once | INTRAVENOUS | Status: AC
  Administered 2015-04-02: 50 mL via INTRAVENOUS

## 2015-04-02 MED ORDER — ONDANSETRON HCL 4 MG PO TABS: 4.0000 mg | ORAL_TABLET | Freq: Four times a day (QID) | ORAL | Status: DC | PRN

## 2015-04-02 NOTE — Progress Notes (Signed)
Pt arrived vis [facility] at 50.  Pt is stable, alert and mostly oriented with occasional confused words.  Afebrile, BP stable, no complaints of pain.  Pt is resting and RN will continue to monitor.

## 2015-04-02 NOTE — ED Notes (Signed)
[facility] called.Marland Kitchenklj

## 2015-04-02 NOTE — ED Notes (Signed)
Pt transported to [facility] via [facility].

## 2015-04-02 NOTE — Clinical Social Work Note (Signed)
Clinical Social Work Assessment  Patient Details  Name: Christian Wall MRN: [id number] Date of Birth: 1960/08/15  Date of referral:  04/02/15               Reason for consult:   (Patient is from [facility])                Permission sought to share information with:   (None.) Permission granted to share information::  No  Name::        Agency::     Relationship::     Contact Information:     Housing/Transportation Living arrangements for the past 2 months:  [place] of Information:  Patient, Spouse Patient Interpreter Needed:  None Criminal Activity/Legal Involvement Pertinent to Current Situation/Hospitalization:  No - Comment as needed Significant Relationships:    Lives with:  Spouse Do you feel safe going back to the place where you live?  Yes (However, wife states she does not like facility due to them not answering the phone when she calls sometimes for patient. ) Need for family participation in patient care:  Yes (Comment) (Wife states that she is primary support for patient.)  Care giving concerns:  There are no care giving concerns at this time. Patient is a resident at [facility].   Social Worker assessment / plan:  CSW met with patient at bedside. Wife was present.  Patient confirms that he presents to [facility] due to [place] issues.  Also, the patient confirms that he is from [facility]. Wife states that he patient is a new resident at the facility and has been there since last Friday.  Patient informed CSW that he receives assistance with completing ADL's. Wife states that the patient does not fall often. She states that he has fallen x2 in 6 months. Also, she states that she is the patient's primary support.  Wife informed CSW that she has not been happy with facility thus far. CSW gave patient a list of ALF. Also, CSW gave wife [place] information. Patient and wife state that they do not have any questions for CSW.   Employment  status:  Disabled (Comment on whether or not currently receiving Disability) Insurance information:  Medicare PT Recommendations:  Not assessed at this time Information / Referral to community resources:   (CSW gave wife a list of facilities and [employer] information.)  Patient/Family's Response to care:  Patient and wife are aware that he will be transported and admitted to [facility]. Both are accepting and appropriate at this time.  Patient/Family's Understanding of and Emotional Response to Diagnosis, Current Treatment, and Prognosis:  Patient and wife are understanding. They state that they do not have any questions.  Emotional Assessment Appearance:  Appears stated age Attitude/Demeanor/Rapport:  Guarded, Other (Patient would stair at [place] and not answer most questions. His wife spoke for him.) Affect (typically observed):  Quiet Orientation:  Oriented to Self, Oriented to Place, Oriented to Situation, Oriented to  Time Alcohol / Substance use:  Not Applicable Psych involvement (Current and /or in the community):  No (Comment)  Discharge Needs  Concerns to be addressed:  Adjustment to Illness Readmission within the last 30 days:  Yes Current discharge risk:  None Barriers to Discharge:  No Barriers Identified   Bernita Buffy, LCSW 04/02/2015, 4:41 PM

## 2015-04-02 NOTE — ED Notes (Signed)
Bed: YT11 Expected date:  Expected time:  Means of arrival:  Comments: EMS- 53yo M, PICC line issue

## 2015-04-02 NOTE — H&P (Signed)
[facility] History and Physical  Christian Wall TXH:[id number] DOB: 05/05/1961 DOA: 04/02/2015  Referring physician: ED physician, Dr. Dalene Seltzer  PCP: Dorrene German, MD   Chief Complaint: PICC line dislodged   HPI:  Patient is 54 year old male with multiple medical conditions including systolic heart failure on milrinone drip, resident of skilled nursing facility, diabetes mellitus, bed bound due to severe atrophy of bilateral calf, peripheral neuropathies, recently discharged from hospital on 03/29/2015 after being hospitalized for acute encephalopathy that was determined to be secondary to medications including OxyIR, Lyrica, Zanaflex. Now presented to [facility] emergency department from skilled nursing facility [facility] after staff member noted PICC line to be dislodged. Please note that patient has PICC line for milrinone drip administration. Patient has somewhat poor historian and unable to provide details. He reports feeling weak and tired, denies chest pain or shortness of breath at this time, also denies specific abdominal or urinary concerns, no fevers or chills. He reports not eating much over the past week, has not been out of the bed.  In emergency department, vital signs notable for blood pressure 89/76 with oxygen saturation 87% on room air, blood work notable for CR 2.31 and glucose 23. TRH asked to admit to step down unit, per cardiology request we'll transfer to [facility] for further management.  Assessment and Plan:  Principal Problem:   Acute respiratory failure with hypoxia - possibly related to acute on chronic systolic CHF - place on Milrinone drip and transfer to [facility], heart failure team notified - continue Torsemide   Active Problems:   Acute on chronic systolic CHF (congestive heart failure) - management per heart failure team     Acute renal failure superimposed on stage 3 chronic kidney disease - Creatinine at baseline ~2, was as high as 3 in  the recent past - Follow creatinine closely, repeat BMP in the morning   Hypotension - hold Hydralazine and Imdur until BP stabilizes     Diabetes mellitus type II, uncontrolled with complications of Diabetic nephropathy and Peripheral neuropathy - continue Lantus as per home regimen and add SSI    Cigarette smoker - Patient reports he quit 2 months ago - Place on nicotine patch if patient wants    Transaminitis with elevated bilirubin - abd [place] requested  - CMET in AM    Anemia of chronic disease - Hemoglobin stable and at baseline with no signs of bleeding - CBC in the morning    Stage IV sacral decubitus ulcer - Wound care consulted     DVT prophylaxis - Lovenox SQ   Radiological Exams on Admission: Dg Chest Portable 1 View  04/02/2015   CLINICAL DATA:  Lactic acidosis.  EXAM: PORTABLE CHEST 1 VIEW  COMPARISON:  03/27/2015  FINDINGS: The PICC has been removed. Chronic cardiomegaly. Slight pulmonary vascular prominence without infiltrates or effusions. Slight pulmonary edema present on the prior exam has resolved.  IMPRESSION: PICC line has been removed. Persistent cardiomegaly. Improved pulmonary vascular congestion.   Electronically Signed   By: Francene Boyers M.D.   On: 04/02/2015 12:32   Code Status: Full Family Communication: Pt at bedside Disposition Plan: Admit for further evaluation, transfer to [facility]   Danie [facility] [phone]   Review of Systems:  Constitutional: Negative for fever, chills. Negative for diaphoresis.  HENT: Negative for hearing loss, ear pain, nosebleeds, congestion, sore throat, neck pain, tinnitus and ear discharge.   Eyes: Negative for blurred vision, double vision, photophobia, pain, discharge and redness.  Respiratory:  Negative for cough, hemoptysis, sputum production, wheezing and stridor.   Cardiovascular: Negative for palpitations, orthopnea, claudication.  Gastrointestinal: Negative for nausea, vomiting and abdominal pain. Negative for  heartburn, constipation, blood in stool and melena.  Genitourinary: Negative for dysuria, urgency, frequency, hematuria and flank pain.  Musculoskeletal: Negative for myalgias, back pain, joint pain and falls.  Skin: Negative for itching and rash.  Neurological: per HPI Endo/Heme/Allergies: Negative for environmental allergies and polydipsia. Does not bruise/bleed easily.  Psychiatric/Behavioral: Negative for suicidal ideas. The patient is not nervous/anxious.      Past Medical History  Diagnosis Date  . Diabetes mellitus   . Atrophy of calf muscles   . Depression   . GERD (gastroesophageal reflux disease)   . Peripheral neuropathy   . Hypercholesteremia   . Hx of tracheostomy   . Chronic systolic CHF (congestive heart failure), NYHA class 3     Past Surgical History  Procedure Laterality Date  . Pancreas surgery  2001    hx pancreatitis  . Anterior cervical decomp/discectomy fusion  04/13/2012    Procedure: ANTERIOR CERVICAL DECOMPRESSION/DISCECTOMY FUSION 1 LEVEL;  Surgeon: Temple Pacini, MD;  Location: [facility] NEURO ORS;  Service: Neurosurgery;  Laterality: N/A;  Cervcial Five-Six Anterior Cervical Decompression and Fusion with Allograft and Plating  . Cardiac catheterization N/A 11/15/2014    Procedure: Right Heart Cath;  Surgeon: Dolores Patty, MD;  Location: [facility] INVASIVE CV LAB;  Service: Cardiovascular;  Laterality: N/A;  . Peripheral vascular catheterization N/A 02/26/2015    Procedure: Abdominal Aortogram;  Surgeon: Chuck Hint, MD;  Location: [facility] INVASIVE CV LAB;  Service: Cardiovascular;  Laterality: N/A;    Social History:  reports that he has been smoking Cigarettes.  He has a 30 pack-year smoking history. He has never used smokeless tobacco. He reports that he does not drink alcohol or use illicit drugs.  No Known Allergies  Family History  Problem Relation Age of Onset  . Diabetes Mellitus II Sister   . CAD Brother   . Cancer Mother     ? type   . Cancer  Father     ? type    Medication Sig  acetaminophen (TYLENOL) 500 MG tablet Take 2 tablets (1,000 mg total) by mouth every 6 (six) hours as needed for mild pain or headache.  atorvastatin (LIPITOR) 40 MG tablet Take 1 tablet (40 mg total) by mouth daily at 6 PM.  bisacodyl (DULCOLAX) 5 MG EC tablet Take 5 mg by mouth daily as needed for mild constipation or moderate constipation.  buPROPion (WELLBUTRIN SR) 150 MG 12 hr tablet Take 150 mg by mouth 2 (two) times daily.  clopidogrel (PLAVIX) 75 MG tablet Take 1 tablet (75 mg total) by mouth daily.  DULoxetine (CYMBALTA) 30 MG capsule Take 1 capsule (30 mg total) by mouth daily.  esomeprazole (NEXIUM) 40 MG capsule Take 40 mg by mouth daily before breakfast.  ferrous sulfate 325 (65 FE) MG EC tablet Take 1 tablet (325 mg total) by mouth daily with breakfast.  hydrALAZINE (APRESOLINE) 25 MG tablet Take 1 tablet (25 mg total) by mouth every 8 (eight) hours.  insulin glargine (LANTUS) 100 UNIT/ML injection Inject 0.2 mLs (20 Units total) into the skin 2 (two) times daily.  isosorbide mononitrate (IMDUR) 30 MG 24 hr tablet Take 1 tablet (30 mg total) by mouth daily.  lipase/protease/amylase (CREON-10/PANCREASE) 12000 UNITS CPEP Take 1-3 capsules by mouth 2 (two) times daily. 3 capsules before meal, and 1 capsule before snack  magnesium oxide (MAG-OX) 400 (241.3 MG) MG tablet Take 400 mg by mouth 2 (two) times daily.  milrinone (PRIMACOR) 20 MG/100ML SOLN infusion Inject 31.8375 mcg/min into the vein continuous. Per [facility] 12 months  Olopatadine HCl (PATADAY) 0.2 % SOLN Apply 1 drop to eye daily as needed. For allergies  torsemide (DEMADEX) 20 MG tablet Take 1 tablet (20 mg total) by mouth 2 (two) times daily. Start the 20 mg twice a day. Start 9/6 at 6 pm  VENTOLIN HFA 108 (90 BASE) MCG/ACT inhaler Inhale 1-2 puffs into the lungs every 6 (six) hours as needed for wheezing or shortness of breath.     Physical Exam: Filed Vitals:   04/02/15 1700  04/02/15 1730 04/02/15 1800 04/02/15 1830  BP: 124/84 121/83 96/61 111/75  Pulse: 99 100 100 99  Temp:      TempSrc:      Resp: Height:      Weight:      SpO2: 99% 99% 91% 98%    Physical Exam  Constitutional: Appears well-developed and well-nourished. In mild distress due to dyspnea  HENT: Normocephalic. External right and left ear normal. Oropharynx is clear and moist.  Eyes: Conjunctivae and EOM are normal. PERRLA, no scleral icterus.  Neck: Normal ROM. Neck supple. No JVD. No tracheal deviation. No thyromegaly.  CVS: RRR, S1/S2 +, SEM 3/6, no gallops Pulmonary: Effort and breath sounds normal, no stridor, bibasilar crackles  Abdominal: Soft. BS +,  no distension, tenderness, rebound or guarding.  Musculoskeletal: Normal range of motion. +1 LE edema Lymphadenopathy: No lymphadenopathy noted, cervical, inguinal. Neuro: Alert. Normal reflexes, muscle tone coordination. No cranial nerve deficit. Skin: Skin is warm and dry. No rash noted. Psychiatric: Normal mood and affect.   Labs on Admission:  Basic Metabolic Panel:  Recent Labs Lab 03/27/15 1523 03/28/15 0435 03/29/15 0455 04/02/15 1032  NA 141 141 139 137  K 5.3* 5.5* 5.1 4.4  CL 98* 99* 98* 98*  CO2 33* GLUCOSE 112* 74 181* 23*  BUN 45* 49* 56* 64*  CREATININE 2.17* 2.48* 2.94* 2.31*  CALCIUM 9.5 9.7 9.1 9.2   Liver Function Tests:  Recent Labs Lab 04/02/15 1032  AST 105*  ALT 81*  ALKPHOS 857*  BILITOT 3.4*  PROT 8.8*  ALBUMIN 3.2*   CBC:  Recent Labs Lab 03/29/15 0455 04/02/15 1032  WBC 9.0 6.7  NEUTROABS  --  4.8  HGB 8.6* 11.4*  HCT 27.6* 36.4*  MCV 67.2* 67.2*  PLT 299 329   CBG:  Recent Labs Lab 03/29/15 1625 04/02/15 1045 04/02/15 1129 04/02/15 1212 04/02/15 1746  GLUCAP 274* 17* 76 85 105*    EKG: pending    If 7PM-7AM, please contact night-coverage [email or web address] Password [facility] 04/02/2015, 6:49 PM

## 2015-04-02 NOTE — ED Notes (Signed)
MD at bedside to perform US guided IV access.

## 2015-04-02 NOTE — ED Notes (Signed)
Family sitting at bedside and given update on pt being transferred to [facility]. Family understanding. Pt resting quietly at this time with no complaints or in NAD.

## 2015-04-02 NOTE — ED Provider Notes (Signed)
CSN: [id number]     Arrival date & time 04/02/15  0845 History   First MD Initiated Contact with Patient 04/02/15 [phone]     Chief Complaint  Patient presents with  . Vascular Access Problem     (Consider location/radiation/quality/duration/timing/severity/associated sxs/prior Treatment) HPI Comments: Patient presents from assisted living facility for concern of accidental PICC removal. Patient's PICC line was removed at approximately 1 AM per patient. Patient and facility deny any acute symptoms, including no acute cough, nausea, vomiting, diarrhea, black or bloody stool, abdominal pain.  Patient reports chest pain which is unchanged from what he has chronically. Denies any worsening shortness of breath. Notes some increasing fatigue   Past Medical History  Diagnosis Date  . Diabetes mellitus   . Atrophy of calf muscles   . Depression   . GERD (gastroesophageal reflux disease)   . Peripheral neuropathy   . Hypercholesteremia   . Hx of tracheostomy   . Chronic systolic CHF (congestive heart failure), NYHA class 3    Past Surgical History  Procedure Laterality Date  . Pancreas surgery  18-Nov-1999    hx pancreatitis  . Anterior cervical decomp/discectomy fusion  04/13/2012    Procedure: ANTERIOR CERVICAL DECOMPRESSION/DISCECTOMY FUSION 1 LEVEL;  Surgeon: Temple Pacini, MD;  Location: [facility] NEURO ORS;  Service: Neurosurgery;  Laterality: N/A;  Cervcial Five-Six Anterior Cervical Decompression and Fusion with Allograft and Plating  . Cardiac catheterization N/A 11/15/2014    Procedure: Right Heart Cath;  Surgeon: Dolores Patty, MD;  Location: [facility] INVASIVE CV LAB;  Service: Cardiovascular;  Laterality: N/A;  . Peripheral vascular catheterization N/A 02/26/2015    Procedure: Abdominal Aortogram;  Surgeon: Chuck Hint, MD;  Location: [facility] INVASIVE CV LAB;  Service: Cardiovascular;  Laterality: N/A;   Family History  Problem Relation Age of Onset  . Diabetes Mellitus II Sister   . CAD  Brother   . Cancer Mother     ? type   . Cancer Father     ? type   Social History  Substance Use Topics  . Smoking status: Current Every Day Smoker -- 1.00 packs/day for 30 years    Types: Cigarettes  . Smokeless tobacco: Never Used  . Alcohol Use: No     Comment: Quit after his brother's death in 11/17/05 or 8     Review of Systems  Constitutional: Positive for fatigue. Negative for fever.  HENT: Negative for sore throat.   Eyes: Negative for visual disturbance.  Respiratory: Negative for shortness of breath.   Cardiovascular: Positive for chest pain (unchanged).  Gastrointestinal: Negative for nausea, vomiting, abdominal pain and diarrhea.  Genitourinary: Negative for difficulty urinating.  Musculoskeletal: Negative for back pain and neck stiffness.  Skin: Negative for rash.  Neurological: Negative for syncope and headaches.      Allergies  Review of patient's allergies indicates no known allergies.  Home Medications   Prior to Admission medications   Medication Sig Start Date End Date Taking? Authorizing Provider  acetaminophen (TYLENOL) 500 MG tablet Take 2 tablets (1,000 mg total) by mouth every 6 (six) hours as needed for mild pain or headache. 11/21/14  Yes Rhonda G Barrett, PA-C  atorvastatin (LIPITOR) 40 MG tablet Take 1 tablet (40 mg total) by mouth daily at 6 PM. 03/01/15  Yes Amy D Clegg, NP  bisacodyl (DULCOLAX) 5 MG EC tablet Take 5 mg by mouth daily as needed for mild constipation or moderate constipation.   Yes Historical Provider, MD  buPROPion [facility]  SR) 150 MG 12 hr tablet Take 150 mg by mouth 2 (two) times daily. 01/29/15  Yes Historical Provider, MD  clopidogrel (PLAVIX) 75 MG tablet Take 1 tablet (75 mg total) by mouth daily. 03/01/15  Yes Amy D Clegg, NP  [employer] VITAMIN B12 1000 MCG tablet Take 1,000 mcg by mouth daily. 10/15/14  Yes Historical Provider, MD  DULoxetine (CYMBALTA) 30 MG capsule Take 1 capsule (30 mg total) by mouth daily. 02/04/15  Yes Drema Dallas, MD  esomeprazole (NEXIUM) 40 MG capsule Take 40 mg by mouth daily before breakfast.   Yes Historical Provider, MD  ferrous sulfate 325 (65 FE) MG EC tablet Take 1 tablet (325 mg total) by mouth daily with breakfast. 03/08/15  Yes Amy D Clegg, NP  hydrALAZINE (APRESOLINE) 25 MG tablet Take 1 tablet (25 mg total) by mouth every 8 (eight) hours. 03/01/15  Yes Amy D Clegg, NP  insulin aspart (NOVOLOG) 100 UNIT/ML injection Inject 0-15 Units into the skin 3 (three) times daily with meals. CBG 70 - 120: 0 units CBG 121 - 150: 2 units CBG 151 - 200: 3 units CBG 201 - 250: 5 units CBG 251 - 300: 8 units CBG 301 - 350: 11 units CBG 351 - 400: 15 units 11/13/13  Yes Maryann Mikhail, DO  insulin glargine (LANTUS) 100 UNIT/ML injection Inject 0.2 mLs (20 Units total) into the skin 2 (two) times daily. 03/13/15  Yes Amy D Clegg, NP  isosorbide mononitrate (IMDUR) 30 MG 24 hr tablet Take 1 tablet (30 mg total) by mouth daily. 02/04/15  Yes Drema Dallas, MD  lipase/protease/amylase (CREON-10/PANCREASE) 12000 UNITS CPEP Take 36,000 Units by mouth 3 (three) times daily before meals.    Yes Historical Provider, MD  magnesium oxide (MAG-OX) 400 (241.3 MG) MG tablet Take 400 mg by mouth 2 (two) times daily. 01/26/15  Yes Historical Provider, MD  milrinone (PRIMACOR) 20 MG/100ML SOLN infusion Inject 31.8375 mcg/min into the vein continuous. Per [facility]. 12 months 03/29/15  Yes Clydia Llano, MD  Olopatadine HCl (PATADAY) 0.2 % SOLN Apply 1 drop to eye daily as needed. For allergies   Yes Historical Provider, MD  torsemide (DEMADEX) 20 MG tablet Take 1 tablet (20 mg total) by mouth 2 (two) times daily. Start the 20 mg twice a day. Start 9/6 at 6 pm 03/13/15  Yes Amy D Clegg, NP  VENTOLIN HFA 108 (90 BASE) MCG/ACT inhaler Inhale 1-2 puffs into the lungs every 6 (six) hours as needed for wheezing or shortness of breath.  02/15/15  Yes Historical Provider, MD   BP 124/84 mmHg  Pulse 99  Temp(Src) 97.5 F (36.4 C) (Oral)   Resp 17  Ht  (1.727 m)  Wt 180 lb (81.647 kg)  BMI 27.38 kg/m2  SpO2 99% Physical Exam  Constitutional: He is oriented to person, place, and time. He appears well-developed. He appears ill. No distress.  Chronically ill appearance Sleepy however answers   HENT:  Head: Normocephalic and atraumatic.  Eyes: Conjunctivae and EOM are normal.  Neck: Normal range of motion.  Cardiovascular: Normal rate, regular rhythm, normal heart sounds and intact distal pulses.  Exam reveals no gallop and no friction rub.   No murmur heard. Pulses:      Radial pulses are 1+ on the right side, and 1+ on the left side.  Pulmonary/Chest: Effort normal and breath sounds normal. No respiratory distress. He has no wheezes. He has no rales.  Abdominal: Soft. He exhibits no distension.  There is no tenderness. There is no guarding.  Musculoskeletal: He exhibits no edema.  Neurological: He is alert and oriented to person, place, and time. GCS eye subscore is 3. GCS verbal subscore is 5. GCS motor subscore is 6.  [facility]orts at [facility]" (however at [facility] long, a [facility]) Sleepy but will answer questions  Skin: Skin is warm and dry. No rash noted. He is not diaphoretic.  Nursing note and vitals reviewed.   ED Course  Procedures (including critical care time) Labs Review Labs Reviewed  CBC WITH DIFFERENTIAL/PLATELET - Abnormal; Notable for the following:    Hemoglobin 11.4 (*)    HCT 36.4 (*)    MCV 67.2 (*)    MCH 21.0 (*)    RDW 20.3 (*)    All other components within normal limits  COMPREHENSIVE METABOLIC PANEL - Abnormal; Notable for the following:    Chloride 98 (*)    Glucose, Bld 23 (*)    BUN 64 (*)    Creatinine, Ser 2.31 (*)    Total Protein 8.8 (*)    Albumin 3.2 (*)    AST 105 (*)    ALT 81 (*)    Alkaline Phosphatase 857 (*)    Total Bilirubin 3.4 (*)    GFR calc non Af Amer 31 (*)    GFR calc Af Amer 35 (*)    All other components within normal limits  BRAIN NATRIURETIC PEPTIDE  - Abnormal; Notable for the following:    B Natriuretic Peptide 1932.0 (*)    All other components within normal limits  I-STAT CG4 LACTIC ACID, ED - Abnormal; Notable for the following:    Lactic Acid, Venous 5.56 (*)    All other components within normal limits  CBG MONITORING, ED - Abnormal; Notable for the following:    Glucose-Capillary 17 (*)    All other components within normal limits  I-STAT CG4 LACTIC ACID, ED - Abnormal; Notable for the following:    Lactic Acid, Venous 4.63 (*)    All other components within normal limits  CBG MONITORING, ED - Abnormal; Notable for the following:    Glucose-Capillary 105 (*)    All other components within normal limits  URINE CULTURE  CULTURE, BLOOD (ROUTINE X 2)  CULTURE, BLOOD (ROUTINE X 2)  URINALYSIS, ROUTINE W REFLEX MICROSCOPIC (NOT AT [facility])  I-STAT TROPOININ, ED  CBG MONITORING, ED  I-STAT TROPOININ, ED  CBG MONITORING, ED  I-STAT TROPOININ, ED  I-STAT CG4 LACTIC ACID, ED    Imaging Review Dg Chest Portable 1 View  04/02/2015   CLINICAL DATA:  Lactic acidosis.  EXAM: PORTABLE CHEST 1 VIEW  COMPARISON:  03/27/2015  FINDINGS: The PICC has been removed. Chronic cardiomegaly. Slight pulmonary vascular prominence without infiltrates or effusions. Slight pulmonary edema present on the prior exam has resolved.  IMPRESSION: PICC line has been removed. Persistent cardiomegaly. Improved pulmonary vascular congestion.   Electronically Signed   By: Francene Boyers M.D.   On: 04/02/2015 12:32   I have personally reviewed and evaluated these images and lab results as part of my medical decision-making.   EKG Interpretation   Date/Time:  Monday April 02 2015 11:03:59 EDT Ventricular Rate:  89 PR Interval:  213 QRS Duration: 156 QT Interval:  444 QTC Calculation: 540 R Axis:   -58 Text Interpretation:  Sinus rhythm Prolonged PR interval LAE, consider  biatrial enlargement Left bundle branch block No significant change since  last  tracing Confirmed by [facility] MD, ERIN (  [id number]) on 04/02/2015 5:49:23  PM      Emergency Ultrasound:  US Guidance for needle guidance  Performed by Dr. Dalene Seltzer Indication: needs peripheral IV access  Linear probe used in real-time to visualize location of needle entry through skin. Interpretation: success full placement of IV catheter into peripheral vein  Image archived electronically.  MDM   Final diagnoses:  None   54 year old male with history of diabetes, systolic congestive heart failure, severe, stage IV, on chronic milrinone drip, peripheral neuropathy, CKD, multiple admissions presents from assisted living facility for concern of removal of his PICC line.  Patient appears sleepy on exam, however per assisted living facility this has been his baseline.  Patient is oriented to self, reports he is at [facility], it is oriented to the day.  Patient denies any other symptoms, and facility denies any other changes.  Patient's blood pressures were initially 90 systolic on arrival to the emergency department.  Labs were obtained given his blood pressures as well as ill appearance.    Labs were significant for hypoglycemia and lactic acidosis with lactate of 5.56. Patient does not have a leukocytosis, nofever, no infectious symptoms and have low suspicion for infectious etiology/sepsis for his elevated lactate. Glucose improved with an amp of D50 and by mouth glucose administration, with hypoglycemia one of possible causes of elevated lactic acid, with most likely cause worsened CHF with pt off milrinone gtt, and other possibility including liver disease.  Patient was noted to have elevation in AST, ALT, alkaline phosphatase, total bilirubin.  He is noted to have mild elevation in these numbers beginning in May of last year, at which time he had a CT which did not show any biliary obstruction etiologies--patient without any abdominal pain and have low suspicion for acute biliary pathology at  this time with appearance of chronic worsening liver failure.   Patient has known severe congestive heart failure, and after the peripheral milrinone drip was started in the emergency department his blood pressures improved to the 120s systolic and had persistent however mildly improving lactic acid.    Consulted the heart failure team, who recommended patient transferred to [facility] for evaluation. Hospitalist was consulted for admission to the stepdown unit. Pt with improved BP and stability in ED.  Family updated on care.    Alvira Monday, MD 04/02/15 1755

## 2015-04-02 NOTE — Progress Notes (Signed)
Pt had 2 vomiting episodes, very small amount.  Pt was given 4 mg Zofran.  RN will continue to monitor.

## 2015-04-02 NOTE — ED Notes (Signed)
Pt comes in today with EMS from [facility] with PICC Displacement. Pt states that he noted his PICC line to be dislodged at 0100 today. Upon assessment, no PICC noted. Pt alert and oriented. Pt was receiving Milirione through his PICC line.

## 2015-04-02 NOTE — ED Notes (Signed)
Admitting MD at bedside.

## 2015-04-03 ENCOUNTER — Encounter: Payer: Medicare Other | Admitting: Vascular Surgery

## 2015-04-03 ENCOUNTER — Inpatient Hospital Stay (HOSPITAL_COMMUNITY): Payer: Medicare Other

## 2015-04-03 DIAGNOSIS — R410 Disorientation, unspecified: Secondary | ICD-10-CM

## 2015-04-03 DIAGNOSIS — R066 Hiccough: Secondary | ICD-10-CM | POA: Diagnosis present

## 2015-04-03 DIAGNOSIS — F329 Major depressive disorder, single episode, unspecified: Secondary | ICD-10-CM

## 2015-04-03 DIAGNOSIS — J9601 Acute respiratory failure with hypoxia: Secondary | ICD-10-CM | POA: Diagnosis present

## 2015-04-03 DIAGNOSIS — I5022 Chronic systolic (congestive) heart failure: Secondary | ICD-10-CM

## 2015-04-03 DIAGNOSIS — R1110 Vomiting, unspecified: Secondary | ICD-10-CM | POA: Diagnosis present

## 2015-04-03 DIAGNOSIS — I270 Primary pulmonary hypertension: Secondary | ICD-10-CM

## 2015-04-03 DIAGNOSIS — G9341 Metabolic encephalopathy: Secondary | ICD-10-CM | POA: Diagnosis present

## 2015-04-03 DIAGNOSIS — G43A1 Cyclical vomiting, intractable: Secondary | ICD-10-CM

## 2015-04-03 LAB — CBC
HCT: 28.8 % — ABNORMAL LOW (ref 39.0–52.0)
Hemoglobin: 8.8 g/dL — ABNORMAL LOW (ref 13.0–17.0)
MCH: 20.6 pg — ABNORMAL LOW (ref 26.0–34.0)
MCHC: 30.6 g/dL (ref 30.0–36.0)
MCV: 67.3 fL — AB (ref 78.0–100.0)
PLATELETS: 273 10*3/uL (ref 150–400)
RBC: 4.28 MIL/uL (ref 4.22–5.81)
RDW: 20.1 % — ABNORMAL HIGH (ref 11.5–15.5)
WBC: 7.6 10*3/uL (ref 4.0–10.5)

## 2015-04-03 LAB — HEMOGLOBIN AND HEMATOCRIT, BLOOD
HCT: 28.9 % — ABNORMAL LOW (ref 39.0–52.0)
HEMOGLOBIN: 8.9 g/dL — AB (ref 13.0–17.0)

## 2015-04-03 LAB — PROTIME-INR
INR: 1.49 (ref 0.00–1.49)
Prothrombin Time: 18.1 seconds — ABNORMAL HIGH (ref 11.6–15.2)

## 2015-04-03 LAB — COMPREHENSIVE METABOLIC PANEL
ALBUMIN: 2.5 g/dL — AB (ref 3.5–5.0)
ALT: 69 U/L — ABNORMAL HIGH (ref 17–63)
ANION GAP: 10 (ref 5–15)
AST: 73 U/L — ABNORMAL HIGH (ref 15–41)
Alkaline Phosphatase: 669 U/L — ABNORMAL HIGH (ref 38–126)
BILIRUBIN TOTAL: 2.7 mg/dL — AB (ref 0.3–1.2)
BUN: 72 mg/dL — ABNORMAL HIGH (ref 6–20)
CO2: 33 mmol/L — ABNORMAL HIGH (ref 22–32)
Calcium: 8.7 mg/dL — ABNORMAL LOW (ref 8.9–10.3)
Chloride: 95 mmol/L — ABNORMAL LOW (ref 101–111)
Creatinine, Ser: 1.97 mg/dL — ABNORMAL HIGH (ref 0.61–1.24)
GFR calc Af Amer: 43 mL/min — ABNORMAL LOW (ref >60)
GFR, EST NON AFRICAN AMERICAN: 37 mL/min — AB (ref >60)
GLUCOSE: 160 mg/dL — AB (ref 65–99)
POTASSIUM: 4.1 mmol/L (ref 3.5–5.1)
Sodium: 138 mmol/L (ref 135–145)
TOTAL PROTEIN: 6.9 g/dL (ref 6.5–8.1)

## 2015-04-03 LAB — MAGNESIUM: MAGNESIUM: 2.4 mg/dL (ref 1.7–2.4)

## 2015-04-03 LAB — APTT: APTT: 35 s (ref 24–37)

## 2015-04-03 LAB — GLUCOSE, CAPILLARY
GLUCOSE-CAPILLARY: 164 mg/dL — AB (ref 65–99)
GLUCOSE-CAPILLARY: 185 mg/dL — AB (ref 65–99)
GLUCOSE-CAPILLARY: 233 mg/dL — AB (ref 65–99)

## 2015-04-03 MED ORDER — PANTOPRAZOLE SODIUM 40 MG IV SOLR
40.0000 mg | INTRAVENOUS | Status: DC
  Administered 2015-04-03 – 2015-04-04 (×2): 40 mg via INTRAVENOUS
  Filled 2015-04-03 (×2): qty 40

## 2015-04-03 MED ORDER — PROMETHAZINE HCL 25 MG/ML IJ SOLN
12.5000 mg | Freq: Once | INTRAMUSCULAR | Status: AC
  Administered 2015-04-03: 12.5 mg via INTRAVENOUS
  Filled 2015-04-03: qty 1

## 2015-04-03 MED ORDER — SODIUM CHLORIDE 0.9 % IV SOLN
25.0000 mg | Freq: Once | INTRAVENOUS | Status: DC | PRN
  Filled 2015-04-03: qty 1

## 2015-04-03 MED ORDER — LIDOCAINE HCL 1 % IJ SOLN
INTRAMUSCULAR | Status: AC
  Filled 2015-04-03: qty 20

## 2015-04-03 MED ORDER — DEXTROSE-NACL 5-0.9 % IV SOLN
INTRAVENOUS | Status: DC
Start: 2015-04-03 — End: 2015-04-05
  Administered 2015-04-03: 30 mL/h via INTRAVENOUS
  Administered 2015-04-05: 02:00:00 via INTRAVENOUS

## 2015-04-03 MED ORDER — GLUCERNA SHAKE PO LIQD: 237.0000 mL | Freq: Three times a day (TID) | ORAL | Status: DC

## 2015-04-03 NOTE — Progress Notes (Addendum)
Initial Nutrition Assessment  DOCUMENTATION CODES:   Not applicable  INTERVENTION:   Glucerna Shake po TID, each supplement provides 220 kcal and 10 grams of protein  NUTRITION DIAGNOSIS:   Increased nutrient needs related to wound healing as evidenced by estimated needs  GOAL:   Patient will meet greater than or equal to 90% of their needs  MONITOR:   PO intake, Supplement acceptance, Labs, Weight trends, I & O's  REASON FOR ASSESSMENT:   Malnutrition Screening Tool  ASSESSMENT:   54 yo male with h/o of chronic systolic HF with EF 20-25%, polysubstance abuse, DM2; presented wtih AMS.  RD unable to obtain nutrition hx at this time.  Pt not feeling well upon RD visit.  Vomit in basin.  Per Malnutrition Screening Tool Report, pt has been eating poorly because of a decreased appetite.  Seen per Clinical Nutrition during previous hospitalization in September.  Nutrition focused physical exam completed 03/22/15.  No muscle or subcutaneous fat depletion noticed.  Diet Order:   Carbohydrate Modified   Skin:   Stage IV decubitus ulcer  Last BM:  9/26  Height:   Ht Readings from Last 1 Encounters:  04/02/15 5\' 4"  (1.626 m)    Weight:   Wt Readings from Last 1 Encounters:  04/03/15 171 lb 1.2 oz (77.6 kg)    Ideal Body Weight:  54.5 kg  BMI:  Body mass index is 29.35 kg/(m^2).  Estimated Nutritional Needs:   Kcal:  2200-2400  Protein:  115-130 gm  Fluid:  2.2-2.4 L  EDUCATION NEEDS:   No education needs identified at this time  Maureen Chatters, RD, LDN Pager #: [phone] After-Hours Pager #: [phone]

## 2015-04-03 NOTE — Progress Notes (Signed)
Received Pt at 07:00 am NPO for Ultrasound. Pt vomiting at 11am and missed opportunity to go, RN checked intermittently with [place] [facility] for repeat appointment and assured it would be done befor midnight. Pt placed on D5 NS @ 30 hour, Lantus held. Will resume meals post test if applicable.

## 2015-04-03 NOTE — Progress Notes (Signed)
Spoke with Dr Joseph Art, pt referred to IR for PICC placement. IR had place PICC on 01/31/2015 d/t PICC coiled in R neck.

## 2015-04-03 NOTE — Care Management Note (Signed)
Case Management Note  Patient Details  Name: Christian Wall MRN: [id number] Date of Birth: [birth year]  Subjective/Objective:    Pt dislodged PICC @ [facility] where he was on a milrinone gtt, was transferred to [facility] from [facility], milrinone gtt restarted.   CSW following.                       Expected Discharge Plan:  Skilled Nursing Facility  In-House Referral:  Clinical Social Work  Discharge planning Services  CM Consult  Status of Service:  In process, will continue to follow  Magdalene River, RN 04/03/2015, 12:06 PM

## 2015-04-03 NOTE — Progress Notes (Signed)
Utilization Review Completed.  

## 2015-04-03 NOTE — Progress Notes (Signed)
Pt is more confused when awake than when he initially arrived.  He has a history of confusion, especially at night.  Bed alarm is on and RN will continue to monitor.

## 2015-04-03 NOTE — Progress Notes (Signed)
[facility]  Patient Status: Active pt with [facility] prior to this readmission  [facility] is providing the following services: Pt is a new resident at [facility] following his last hospital admission. Pt was long standing home Milrinone pt with [facility] but pt now at [facility].  [facility] team is providing patient's milrinone while he is at [facility] hospital team will follow Mr. Dorrance while an inpatient to support his transition at time of DC.  Reyne Dumas RN and Donald Pore, RN at [facility] advised yesterday of his admission.   If patient discharges after hours, please call [phone].   Sedalia Muta 04/03/2015, 9:21 AM

## 2015-04-03 NOTE — Consult Note (Signed)
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 He is on chronic home milrinone at 0.340mcg.   Recently admitted to [facility] for AMS. No true source was identified for AMS. Ammonia was ok. Due to deconditioning he was discharged [facility] on milrinone 0.375 mcg.   Yesterday he was admitted [facility] because his PICC was dislodged at SNF. Initially presented for replacement of PICC. Later found to have hypoglycemia with glucose of 17. Given HF he was transferred to [facility] for the HF team to  Follow manage. CXR on admit showed persistent cardiomegaly and improve pulmonary vascular congestion.   Today he denies SOB.   Other pertinent admission labs include: Lactic Acid 5.56, BNP 1932, K 4.4 Creatinine 2.3 , Hgb 11.4, WBC 6.7. Glucose 23  Review of Systems: [y] = yes, [ ]  = no   General: Weight gain [ ] ; Weight loss [ ] ; Anorexia [ ] ; Fatigue [Y ]; Fever [ ] ; Chills [ ] ; Weakness [Y ]  Cardiac: Chest pain/pressure [ ] ; Resting SOB [ ] ; Exertional SOB [Y ]; Orthopnea [ ] ; Pedal Edema [ ] ; Palpitations [ ] ; Syncope [ ] ; Presyncope [ ] ; Paroxysmal nocturnal dyspnea[ ]   Pulmonary: Cough [ ] ; Wheezing[ ] ; Hemoptysis[ ] ; Sputum [ ] ; Snoring [ ]   GI: Vomiting[ ] ; Dysphagia[ ] ; Melena[ ] ; Hematochezia [ ] ; Heartburn[ ] ; Abdominal pain [ ] ; Constipation [ ] ; Diarrhea [ ] ; BRBPR [ ]   GU: Hematuria[ ] ; Dysuria [ ] ; Nocturia[ ]   Vascular: Pain in legs with walking [ ] ; Pain in feet with lying flat [ ] ; Non-healing sores [ ] ; Stroke [ ] ; TIA [ ] ; Slurred speech [ ] ;  Neuro: Headaches[ ] ; Vertigo[  ]; Seizures[ ] ; Paresthesias[ ] ;Blurred vision [ ] ; Diplopia [ ] ; Vision changes [ ]   Ortho/Skin: Arthritis [ ] ; Joint pain [ ] ; Muscle pain [ ] ; Joint swelling [ ] ; Back Pain [ ] ; Rash [ ]   Psych: Depression[ ] ; Anxiety[ ]   Heme: Bleeding problems [ ] ; Clotting disorders [ ] ; Anemia [ ]   Endocrine: Diabetes [Y ]; Thyroid dysfunction[ ]   Home Medications Prior to Admission medications   Medication Sig Start Date End Date Taking? Authorizing Provider  acetaminophen (TYLENOL) 500 MG tablet Take 2 tablets (1,000 mg total) by mouth every 6 (six) hours as needed for mild pain or headache. 11/21/14  Yes Rhonda G Barrett, PA-C  atorvastatin (LIPITOR) 40 MG tablet Take 1 tablet (40 mg total) by mouth daily at 6 PM. 03/01/15  Yes Sharmain Lastra D Clegg, NP  bisacodyl (DULCOLAX) 5 MG EC tablet Take 5 mg by mouth daily as needed for mild constipation or moderate constipation.   Yes Historical Provider, MD  buPROPion (WELLBUTRIN SR) 150 MG 12 hr tablet Take 150 mg by mouth 2 (two) times daily. 01/29/15  Yes Historical Provider, MD  clopidogrel (PLAVIX) 75 MG tablet Take 1 tablet (75 mg total) by mouth daily. 03/01/15  Yes Cleon Signorelli D Clegg, NP  [employer] VITAMIN B12 1000 MCG tablet Take 1,000 mcg by mouth daily. 10/15/14  Yes Historical Provider, MD  DULoxetine (CYMBALTA) 30 MG capsule Take  1 capsule (30 mg total) by mouth daily. 02/04/15  Yes Drema Dallas, MD  esomeprazole (NEXIUM) 40 MG capsule Take 40 mg by mouth daily before breakfast.   Yes Historical Provider, MD  ferrous sulfate 325 (65 FE) MG EC tablet Take 1 tablet (325 mg total) by mouth daily with breakfast. 03/08/15  Yes Harry Shuck D Clegg, NP  hydrALAZINE (APRESOLINE) 25 MG tablet Take 1 tablet (25 mg total) by mouth every 8 (eight) hours. 03/01/15  Yes Sakina Briones D Clegg, NP  insulin aspart (NOVOLOG) 100 UNIT/ML injection Inject 0-15 Units into the skin 3 (three) times daily with meals. CBG 70 - 120: 0 units CBG 121 - 150: 2 units CBG 151 - 200: 3 units CBG 201 - 250: 5 units CBG 251  - 300: 8 units CBG 301 - 350: 11 units CBG 351 - 400: 15 units 11/13/13  Yes Maryann Mikhail, DO  insulin glargine (LANTUS) 100 UNIT/ML injection Inject 0.2 mLs (20 Units total) into the skin 2 (two) times daily. 03/13/15  Yes Avondre Richens D Clegg, NP  isosorbide mononitrate (IMDUR) 30 MG 24 hr tablet Take 1 tablet (30 mg total) by mouth daily. 02/04/15  Yes Drema Dallas, MD  lipase/protease/amylase (CREON-10/PANCREASE) 12000 UNITS CPEP Take 36,000 Units by mouth 3 (three) times daily before meals.    Yes Historical Provider, MD  magnesium oxide (MAG-OX) 400 (241.3 MG) MG tablet Take 400 mg by mouth 2 (two) times daily. 01/26/15  Yes Historical Provider, MD  milrinone (PRIMACOR) 20 MG/100ML SOLN infusion Inject 31.8375 mcg/min into the vein continuous. Per [facility]. 12 months 03/29/15  Yes Clydia Llano, MD  Olopatadine HCl (PATADAY) 0.2 % SOLN Apply 1 drop to eye daily as needed. For allergies   Yes Historical Provider, MD  torsemide (DEMADEX) 20 MG tablet Take 1 tablet (20 mg total) by mouth 2 (two) times daily. Start the 20 mg twice a day. Start 9/6 at 6 pm 03/13/15  Yes Nirav Sweda D Clegg, NP  VENTOLIN HFA 108 (90 BASE) MCG/ACT inhaler Inhale 1-2 puffs into the lungs every 6 (six) hours as needed for wheezing or shortness of breath.  02/15/15  Yes Historical Provider, MD    Past Medical History: Past Medical History  Diagnosis Date  . Diabetes mellitus   . Atrophy of calf muscles   . Depression   . GERD (gastroesophageal reflux disease)   . Peripheral neuropathy   . Hypercholesteremia   . Hx of tracheostomy   . Chronic systolic CHF (congestive heart failure), NYHA class 3     Past Surgical History: Past Surgical History  Procedure Laterality Date  . Pancreas surgery  2001    hx pancreatitis  . Anterior cervical decomp/discectomy fusion  04/13/2012    Procedure: ANTERIOR CERVICAL DECOMPRESSION/DISCECTOMY FUSION 1 LEVEL;  Surgeon: Temple Pacini, MD;  Location: [facility] NEURO ORS;  Service: Neurosurgery;  Laterality:  N/A;  Cervcial Five-Six Anterior Cervical Decompression and Fusion with Allograft and Plating  . Cardiac catheterization N/A 11/15/2014    Procedure: Right Heart Cath;  Surgeon: Dolores Patty, MD;  Location: [facility] INVASIVE CV LAB;  Service: Cardiovascular;  Laterality: N/A;  . Peripheral vascular catheterization N/A 02/26/2015    Procedure: Abdominal Aortogram;  Surgeon: Chuck Hint, MD;  Location: [facility] INVASIVE CV LAB;  Service: Cardiovascular;  Laterality: N/A;    Family History: Family History  Problem Relation Age of Onset  . Diabetes Mellitus II Sister   . CAD Brother   . Cancer Mother     ?  type   . Cancer Father     ? type    Social History: Social History   Social History  . Marital Status: Married    Spouse Name: N/A  . Number of Children: N/A  . Years of Education: N/A   Occupational History  . disabled    Social History Main Topics  . Smoking status: Current Every Day Smoker -- 1.00 packs/day for 30 years    Types: Cigarettes  . Smokeless tobacco: Never Used  . Alcohol Use: No     Comment: Quit after his brother's death in 2005-11-20 or 8   . Drug Use: No  . Sexual Activity: Yes   Other Topics Concern  . None   Social History Narrative   Lives in [place] with wife, uses crutches or wheelchair to get around    Allergies:  No Known Allergies  Objective:    Vital Signs:   Temp:  [97.5 F (36.4 C)-98.4 F (36.9 C)] 97.8 F (36.6 C) (09/27 0400) Pulse Rate:  [88-103] 100 (09/27 0600) Resp:  [12-22] 14 (09/27 0600) BP: (89-131)/(46-94) 105/68 mmHg (09/27 0600) SpO2:  [87 %-100 %] 98 % (09/27 0400) Weight:  [171 lb 1.2 oz (77.6 kg)-180 lb (81.647 kg)] 171 lb 1.2 oz (77.6 kg) (09/27 0257)    Weight change: Filed Weights   04/02/15 0848 04/02/15 2014 04/03/15 0257  Weight: 180 lb (81.647 kg) 177 lb 7.5 oz (80.5 kg) 171 lb 1.2 oz (77.6 kg)    Intake/Output:   Intake/Output Summary (Last 24 hours) at 04/03/15 0836 Last data filed at  04/03/15 0700  Gross per 24 hour  Intake      0 ml  Output    276 ml  Net   -276 ml     Physical Exam: General:  Fatigued appearing.  No resp difficulty. Sitting in the chair.  HEENT: normal Neck: supple. JVP ~10 . Carotids 2+ bilat; no bruits. No lymphadenopathy or thryomegaly appreciated. Cor: PMI nondisplaced. Tachy Regular rate & rhythm. No rubs, or murmurs. + S3  Lungs: clear Abdomen: soft, nontender, nondistended. No hepatosplenomegaly. No bruits or masses. Good bowel sounds. Extremities: no cyanosis, clubbing, rash, edema Neuro: alert & orientedx2 person and place, cranial nerves grossly intact. moves all 4 extremities w/o difficulty. Affect pleasant  Telemetry: Sinus Tach 110s.  Labs: Basic Metabolic Panel:  Recent Labs Lab 03/27/15 1523 03/28/15 0435 03/29/15 0455 04/02/15 1032  NA 141 141 139 137  K 5.3* 5.5* 5.1 4.4  CL 98* 99* 98* 98*  CO2 33* GLUCOSE 112* 74 181* 23*  BUN 45* 49* 56* 64*  CREATININE 2.17* 2.48* 2.94* 2.31*  CALCIUM 9.5 9.7 9.1 9.2    Liver Function Tests:  Recent Labs Lab 04/02/15 1032  AST 105*  ALT 81*  ALKPHOS 857*  BILITOT 3.4*  PROT 8.8*  ALBUMIN 3.2*   No results for input(s): LIPASE, AMYLASE in the last 168 hours. No results for input(s): AMMONIA in the last 168 hours.  CBC:  Recent Labs Lab 03/29/15 0455 04/02/15 1032 04/03/15 0329  WBC 9.0 6.7 7.6  NEUTROABS  --  4.8  --   HGB 8.6* 11.4* 8.8*  HCT 27.6* 36.4* 28.8*  MCV 67.2* 67.2* 67.3*  PLT 299 329 273    Cardiac Enzymes: No results for input(s): CKTOTAL, CKMB, CKMBINDEX, TROPONINI in the last 168 hours.  BNP: BNP (last 3 results)  Recent Labs  03/11/15 1702 03/21/15 1200 04/02/15 1032  BNP 1184.5* 824.5*  1932.0*    ProBNP (last 3 results) No results for input(s): PROBNP in the last 8760 hours.   CBG:  Recent Labs Lab 04/02/15 1045 04/02/15 1129 04/02/15 1212 04/02/15 1746 04/02/15 2148  GLUCAP 17* 76 85 105* 104*     Coagulation Studies:  Recent Labs  04/03/15 0329  LABPROT 18.1*  INR 1.49    Other results: EKG:  Imaging: Dg Chest Portable 1 View  04/02/2015   CLINICAL DATA:  Lactic acidosis.  EXAM: PORTABLE CHEST 1 VIEW  COMPARISON:  03/27/2015  FINDINGS: The PICC has been removed. Chronic cardiomegaly. Slight pulmonary vascular prominence without infiltrates or effusions. Slight pulmonary edema present on the prior exam has resolved.  IMPRESSION: PICC line has been removed. Persistent cardiomegaly. Improved pulmonary vascular congestion.   Electronically Signed   By: Francene Boyers M.D.   On: 04/02/2015 12:32   Dg Abd Portable 1v  04/03/2015   CLINICAL DATA:  Vomiting.  History of diabetes.  EXAM: PORTABLE ABDOMEN - 1 VIEW  COMPARISON:  CT abdomen and pelvis Nov 09, 2013  FINDINGS: Paucity of bowel gas. Small amount of gas in the colon with stool density projecting at the rectum. Vascular stent projects in RIGHT Common iliac artery. No intra-abdominal mass effect or pathologic calcifications. Moderate aortoiliac vascular calcifications. 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  IMPRESSION: Nonspecific bowel gas pattern.   Electronically Signed   By: Awilda Metro M.D.   On: 04/03/2015 02:22      Medications:     Current Medications: . atorvastatin  40 mg Oral q1800  . buPROPion  150 mg Oral BID  . clopidogrel  75 mg Oral Daily  . DULoxetine  30 mg Oral Daily  . enoxaparin (LOVENOX) injection  40 mg Subcutaneous Q24H  . ferrous sulfate  325 mg Oral Q breakfast  . insulin aspart  0-9 Units Subcutaneous TID WC  . insulin glargine  20 Units Subcutaneous BID  . lipase/protease/amylase  36,000 Units Oral TID AC  . olopatadine  1 drop Both Eyes BID  . pantoprazole  40 mg Oral Daily  . torsemide  20 mg Oral BID     Infusions: . milrinone 0.375 mcg/kg/min (04/02/15 2130)      Assessment:  1. Hypoglycemia- uncontrolled DM 2. Chronic Systolic  Heart Failure  3. CKD Stage III 4. Smoker  5. PAD Stent placement R common iliac 02/2015 - on plavix     Plan/Discussion:    Mr Creech is well known to the HF team as he if followed closely in the HF clinic as he is inotrope dependent. Recently having more episodes of AMS. Today he is pleasantly confused   From HF perspective he is stable and will need to continue milrinone at 0.375 mcg. Plan for PICC today. He is not on bb with low output. He is no ace with CKD. Volume status ok and weight trending down. Continue torsemide 20 mg twice a day. Follow renal function.    He is not a candidate for advanced therapies due to CKD. Palliative Care consulted last admit however he declined.     Length of Stay: 1  CLEGG,Anyae Griffith NP-C  04/03/2015, 8:36 AM  Advanced Heart Failure Team Pager [phone] (M-F; 7a - 4p)  Please contact [facility] for night-coverage after hours (4p -7a ) and weekends on [email or web address]  Patient seen and examined with [name], NP. We discussed all aspects of the encounter. I agree with the assessment and plan  as stated above.   HF remains relatively stable but continues to have delirium. Had had extensive w/u which has been negative. I am unclear of the etiology. Doubt this is related to milrinone as he has been on it previously without problems. Is it worth Psych consult or referral to tertiary center for further evaluation.   Bensimhon, Daniel,MD 10:41 AM

## 2015-04-03 NOTE — Progress Notes (Signed)
CSW informed pt is from [facility].  CSW confirmed with facility.  [facility] ED social worker has completed assessment and apparently family has some concerns about G[facility] but is agreeable to return- CSW attempted to call spouse and confirm- left message  CSW will continue to follow.  Merlyn Lot, LCSWA Clinical Social Worker [phone]

## 2015-04-03 NOTE — Progress Notes (Signed)
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 NOTE; prior to that discharged from cardiology's service on 03/13/2015  Now presented to [facility] emergency department from skilled nursing facility [facility] after staff member noted PICC line to be dislodged. Please note that patient has PICC line for milrinone drip administration. Patient has somewhat poor historian and unable to provide details. He reports feeling weak and tired, denies chest pain or shortness of breath at this time, also denies specific abdominal or urinary concerns, no fevers or chills. He reports not eating much over the past week, has not been out of the bed.  In emergency department, vital signs notable for blood pressure 89/76 with oxygen saturation 87% on room air, blood work notable for CR 2.31 and glucose 23. TRH asked to admit to step down unit, per cardiology request will transfer to [facility] for further management.   HPI/Subjective: 9/27 A/O 4, positive hiccups, positive N/V, A/O 2 (does not know where,why)  Assessment/Plan: Metabolic encephalopathy -If patient continues to have mental status changes would obtain MRI  Acute respiratory failure with hypoxia - possibly related to acute on chronic systolic CHF - place on Milrinone drip and transfer to [facility], heart failure team notified - continue Torsemide   Acute on chronic systolic CHF  (congestive heart failure)NYHA III  - management per heart failure team   Pulmonary hypertension -See systolic CHF   Acute renal failure superimposed on stage 3 chronic kidney disease - Creatinine at baseline ~2, was as high as 3 in the recent past - Follow creatinine closely, repeat BMP in the morning   Hypotension - hold Hydralazine and Imdur until BP stabilizes   Diabetes mellitus type II, uncontrolled with complications of Diabetic nephropathy and Peripheral neuropathy -D5-NS 15ml/hr -continue Lantus as per home regimen and add SSI -Requested IR placed PICC line   Cigarette smoker - Patient reports he quit 2 months ago - Place on nicotine patch if patient wants   Transaminitis with elevated bilirubin - abd [place] requested  - CMET in AM   Anemia of chronic disease - Hemoglobin stable and at baseline with no signs of bleeding - CBC in the morning   Stage IV sacral decubitus ulcer - Wound care consulted   Depression -Continue Cymbalta 30 mg daily  Hiccups/intractable nausea/vomiting -Thorazine IV 25 mg 1  Palliative care  -Patient with multiple organ system failure, and frequent visits to the hospital discuss short-term and long-term goals of care    Code Status: FULL Family Communication: no family present at time of exam Disposition Plan:     Consultants:   Procedure/Significant Events: 8/23 echocardiogram;- LVEF= 15%, Diffuse hypokinesis.- Doppler parameters c/w restrictive physiology, indicative decreased left ventricular diastolic compliance and/or increased left atrial pressure. - Pulmonary arteries:  PApeak pressure: 48 mm Hg (S).   Culture   Antibiotics:   DVT prophylaxis:  Lovenox SQ   Devices    LINES / TUBES:      Continuous Infusions: . dextrose 5 % and 0.9%  NaCl 30 mL/hr (04/03/15 1427)  . milrinone 0.375 mcg/kg/min (04/03/15 1800)    Objective: VITAL SIGNS: Temp: 97.9 F (36.6 C) (09/27 2106) Temp Source: Oral  (09/27 2106) BP: 119/71 mmHg (09/27 2106) Pulse Rate: 107 (09/27 2106) SPO2; FIO2:   Intake/Output Summary (Last 24 hours) at 04/03/15 2113 Last data filed at 04/03/15 1947  Gross per 24 hour  Intake  166.5 ml  Output   1176 ml  Net -1009.5 ml     Exam: General: A/O 4, positive hiccups, positive N/V, A/O 2 (does not know where,why), patient clearly confused, No acute respiratory distress Eyes: Negative headache, eye pain, double vision,negative scleral hemorrhage ENT: Negative Runny nose, negative ear pain, negative gingival bleeding, Neck:  Negative scars, masses, torticollis, lymphadenopathy, JVD Lungs: Clear to auscultation bilaterally without wheezes or crackles Cardiovascular: Regular rate and rhythm without murmur gallop or rub normal S1 and S2 Abdomen:negative abdominal pain, nondistended, positive soft, bowel sounds, no rebound, no ascites, no appreciable mass Extremities: No significant cyanosis, clubbing, or edema bilateral lower extremities Psychiatric:  Negative depression, negative anxiety, negative fatigue, negative mania  Neurologic:  Cranial nerves II through XII intact, tongue/uvula midline, all extremities muscle strength 5/5, sensation intact throughout,    Data Reviewed: Basic Metabolic Panel:  Recent Labs Lab 03/28/15 0435 03/29/15 0455 04/02/15 1032 04/03/15 0945  NA 141 139 137 138  K 5.5* 5.1 4.4 4.1  CL 99* 98* 98* 95*  CO2 33*  GLUCOSE 74 181* 23* 160*  BUN 49* 56* 64* 72*  CREATININE 2.48* 2.94* 2.31* 1.97*  CALCIUM 9.7 9.1 9.2 8.7*  MG  --   --   --  2.4   Liver Function Tests:  Recent Labs Lab 04/02/15 1032 04/03/15 0945  AST 105* 73*  ALT 81* 69*  ALKPHOS 857* 669*  BILITOT 3.4* 2.7*  PROT 8.8* 6.9  ALBUMIN 3.2* 2.5*   No results for input(s): LIPASE, AMYLASE in the last 168 hours. No results for input(s): AMMONIA in the last 168 hours. CBC:  Recent Labs Lab 03/29/15 0455 04/02/15 1032 04/03/15 0329  04/03/15 1115  WBC 9.0 6.7 7.6  --   NEUTROABS  --  4.8  --   --   HGB 8.6* 11.4* 8.8* 8.9*  HCT 27.6* 36.4* 28.8* 28.9*  MCV 67.2* 67.2* 67.3*  --   PLT 299 329 273  --    Cardiac Enzymes: No results for input(s): CKTOTAL, CKMB, CKMBINDEX, TROPONINI in the last 168 hours. BNP (last 3 results)  Recent Labs  03/11/15 1702 03/21/15 1200 04/02/15 1032  BNP 1184.5* 824.5* 1932.0*    ProBNP (last 3 results) No results for input(s): PROBNP in the last 8760 hours.  CBG:  Recent Labs Lab 04/02/15 1746 04/02/15 2148 04/03/15 1301 04/03/15 1632 04/03/15 2103  GLUCAP 105* 104* 164* 185* 233*    Recent Results (from the past 240 hour(s))  Blood culture (routine x 2)     Status: None (Preliminary result)   Collection Time: 04/02/15 12:13 PM  Result Value Ref Range Status   Specimen Description BLOOD LEFT ANTECUBITAL  Final   Special Requests BOTTLES DRAWN AEROBIC AND ANAEROBIC  Final   Culture   Final    NO GROWTH < 24 HOURS Performed at [facility]    Report Status PENDING  Incomplete  Blood culture (routine x 2)     Status: None (Preliminary result)   Collection Time: 04/02/15  1:25 PM  Result Value Ref Range Status  Specimen Description BLOOD LEFT HAND  Final   Special Requests IN PEDIATRIC BOTTLE 3 CC  Final   Culture   Final    NO GROWTH < 24 HOURS Performed at [facility]    Report Status PENDING  Incomplete  MRSA PCR Screening     Status: None   Collection Time: 04/02/15  8:49 PM  Result Value Ref Range Status   MRSA by PCR NEGATIVE NEGATIVE Final    Comment:        The GeneXpert MRSA Assay (FDA approved for NASAL specimens only), is one component of a comprehensive MRSA colonization surveillance program. It is not intended to diagnose MRSA infection nor to guide or monitor treatment for MRSA infections.      Studies:  Recent x-ray studies have been reviewed in detail by the Attending Physician  Scheduled Meds:  Scheduled  Meds: . atorvastatin  40 mg Oral q1800  . buPROPion  150 mg Oral BID  . clopidogrel  75 mg Oral Daily  . DULoxetine  30 mg Oral Daily  . ferrous sulfate  325 mg Oral Q breakfast  . insulin aspart  0-9 Units Subcutaneous TID WC  . insulin glargine  20 Units Subcutaneous BID  . lidocaine      . lipase/protease/amylase  36,000 Units Oral TID AC  . olopatadine  1 drop Both Eyes BID  . pantoprazole (PROTONIX) IV  40 mg Intravenous Q24H  . torsemide  20 mg Oral BID    Time spent on care of this patient: 40 mins   WOODS, Roselind Messier , MD  [facility] Office  [phone] Pager - [phone]  On-Call/Text Page:      [email or web address]      password [email or web address]  If 7PM-7AM, please contact night-coverage [email or web address] Password [facility] 04/03/2015, 9:13 PM   LOS: 1 day   Care during the described time interval was provided by me .  I have reviewed this patient's available data, including medical history, events of note, physical examination, and all test results as part of my evaluation. I have personally reviewed and interpreted all radiology studies.   Carolyne Littles, MD [phone] Pager

## 2015-04-04 ENCOUNTER — Encounter (HOSPITAL_COMMUNITY): Payer: Medicare Other

## 2015-04-04 DIAGNOSIS — I9589 Other hypotension: Secondary | ICD-10-CM

## 2015-04-04 DIAGNOSIS — G9341 Metabolic encephalopathy: Secondary | ICD-10-CM

## 2015-04-04 DIAGNOSIS — Z515 Encounter for palliative care: Secondary | ICD-10-CM

## 2015-04-04 DIAGNOSIS — E1165 Type 2 diabetes mellitus with hyperglycemia: Secondary | ICD-10-CM

## 2015-04-04 DIAGNOSIS — F05 Delirium due to known physiological condition: Secondary | ICD-10-CM

## 2015-04-04 LAB — GLUCOSE, CAPILLARY
GLUCOSE-CAPILLARY: 150 mg/dL — AB (ref 65–99)
Glucose-Capillary: 171 mg/dL — ABNORMAL HIGH (ref 65–99)
Glucose-Capillary: 136 mg/dL — ABNORMAL HIGH (ref 65–99)
Glucose-Capillary: 141 mg/dL — ABNORMAL HIGH (ref 65–99)

## 2015-04-04 LAB — URINE CULTURE

## 2015-04-04 MED ORDER — SODIUM CHLORIDE 0.9 % IJ SOLN: 10.0000 mL | INTRAMUSCULAR | Status: DC | PRN

## 2015-04-04 MED ORDER — SODIUM CHLORIDE 0.9 % IJ SOLN
10.0000 mL | Freq: Two times a day (BID) | INTRAMUSCULAR | Status: DC
  Administered 2015-04-04: 10 mL

## 2015-04-04 MED ORDER — ALTEPLASE 2 MG IJ SOLR
2.0000 mg | Freq: Once | INTRAMUSCULAR | Status: AC
  Administered 2015-04-04: 2 mg
  Filled 2015-04-04: qty 2

## 2015-04-04 MED ORDER — BUPROPION HCL ER (SR) 100 MG PO TB12
100.0000 mg | ORAL_TABLET | Freq: Two times a day (BID) | ORAL | Status: DC
  Administered 2015-04-04: 100 mg via ORAL
  Filled 2015-04-04 (×3): qty 1

## 2015-04-04 NOTE — Progress Notes (Signed)
Advanced Heart Failure Rounding Note   Subjective:    Admitted with hypoglycemia and replacement of PICC.   Denies SOB. Remains confused.     Objective:   Weight Range:  Vital Signs:   Temp:  [97.6 F (36.4 C)-98.4 F (36.9 C)] 98 F (36.7 C) (09/28 0826) Pulse Rate:  [101-109] 104 (09/28 0826) Resp:  [14-20] 14 (09/28 0826) BP: (96-135)/(49-95) 108/71 mmHg (09/28 0826) SpO2:  [95 %-100 %] 97 % (09/28 0826) Weight:  [171 lb 4.8 oz (77.7 kg)] 171 lb 4.8 oz (77.7 kg) (09/28 0500) Last BM Date: 04/04/15  Weight change: Filed Weights   04/02/15 2014 04/03/15 0257 04/04/15 0500  Weight: 177 lb 7.5 oz (80.5 kg) 171 lb 1.2 oz (77.6 kg) 171 lb 4.8 oz (77.7 kg)    Intake/Output:   Intake/Output Summary (Last 24 hours) at 04/04/15 8756 Last data filed at 04/04/15 0600  Gross per 24 hour  Intake  466.5 ml  Output    900 ml  Net -433.5 ml   Physical Exam: General: Fatigued appearing. No resp difficulty. Sitting in the chair.  HEENT: normal Neck: supple. JVP ~10 . Carotids 2+ bilat; no bruits. No lymphadenopathy or thryomegaly appreciated. Cor: PMI nondisplaced. Tachy Regular rate & rhythm. No rubs, or murmurs. + S3  Lungs: clear Abdomen: soft, nontender, nondistended. No hepatosplenomegaly. No bruits or masses. Good bowel sounds. Extremities: no cyanosis, clubbing, rash, edema. RUE double lumen PICC  Neuro: alert & oriented x1 person. Cranial nerves grossly intact. moves all 4 extremities w/o difficulty. +past-pointing on finger to nose Affect pleasant  Telemetry: Sinus Tach 110s   Labs: Basic Metabolic Panel:  Recent Labs Lab 03/29/15 0455 04/02/15 1032 04/03/15 0945  NA 139 137 138  K 5.1 4.4 4.1  CL 98* 98* 95*  CO2 30 24 33*  GLUCOSE 181* 23* 160*  BUN 56* 64* 72*  CREATININE 2.94* 2.31* 1.97*  CALCIUM 9.1 9.2 8.7*  MG  --   --  2.4    Liver Function Tests:  Recent Labs Lab 04/02/15 1032 04/03/15 0945  AST 105* 73*  ALT 81* 69*  ALKPHOS  857* 669*  BILITOT 3.4* 2.7*  PROT 8.8* 6.9  ALBUMIN 3.2* 2.5*   No results for input(s): LIPASE, AMYLASE in the last 168 hours. No results for input(s): AMMONIA in the last 168 hours.  CBC:  Recent Labs Lab 03/29/15 0455 04/02/15 1032 04/03/15 0329 04/03/15 1115  WBC 9.0 6.7 7.6  --   NEUTROABS  --  4.8  --   --   HGB 8.6* 11.4* 8.8* 8.9*  HCT 27.6* 36.4* 28.8* 28.9*  MCV 67.2* 67.2* 67.3*  --   PLT 299 329 273  --     Cardiac Enzymes: No results for input(s): CKTOTAL, CKMB, CKMBINDEX, TROPONINI in the last 168 hours.  BNP: BNP (last 3 results)  Recent Labs  03/11/15 1702 03/21/15 1200 04/02/15 1032  BNP 1184.5* 824.5* 1932.0*    ProBNP (last 3 results) No results for input(s): PROBNP in the last 8760 hours.    Other results:  Imaging: US Abdomen Complete  04/03/2015   CLINICAL DATA:  Transaminitis  EXAM: ULTRASOUND ABDOMEN COMPLETE  COMPARISON:  Renal ultrasound 01/30/2015  FINDINGS: Gallbladder: Surgically absent  Common bile duct: Diameter: 3 mm  Liver: Lobulated appearance without focal mass lesion. Normal echogenicity. Small perihepatic ascites present. Antegrade flow in the imaged portal venous system.  IVC: No abnormality visualized.  Pancreas: Not visualized  Spleen: Size and appearance within  normal limits.  Right Kidney: Length: 10.5 cm. Pelviectasis, stable from abdominal CT 11/09/2013.  Left Kidney: Length: 11 cm. No hydronephrosis. No evidence of solid mass. Simple cysts are noted, 33 mm in the interpolar and 45 mm in the lower pole regions.  Abdominal aorta: No aneurysm visualized. Distal aorta not able to be seen but no aneurysm on 2015 CT.  Other findings: None.  IMPRESSION: 1. Lobulated appearance of the liver with small perihepatic ascites. Correlate for cirrhosis risk factors. 2. Chronic findings are stable from previous studies and described above. 3. Pancreas could not be visualized.   Electronically Signed   By: Monte Fantasia M.D.   On:  04/03/2015 22:41   Ir Fluoro Guide Cv Line Right  04/03/2015   INDICATION: CHF  EXAM: ULTRASOUND AND FLUOROSCOPIC GUIDED PICC LINE INSERTION  MEDICATIONS: None  CONTRAST:  None  FLUOROSCOPY TIME:  60 seconds.  COMPLICATIONS: None immediate  TECHNIQUE: The procedure, risks, benefits, and alternatives were explained to the patient and informed written consent was obtained. A timeout was performed prior to the initiation of the procedure.  The right upper extremity was prepped with chlorhexidine in a sterile fashion, and a sterile drape was applied covering the operative field. Maximum barrier sterile technique with sterile gowns and gloves were used for the procedure. A timeout was performed prior to the initiation of the procedure. Local anesthesia was provided with 1% lidocaine.  Under direct ultrasound guidance, the right basilic vein was accessed with a micropuncture kit after the overlying soft tissues were anesthetized with 1% lidocaine. An ultrasound image was saved for documentation purposes. A guidewire was advanced to the level of the superior caval-atrial junction for measurement purposes and the PICC line was cut to length. A peel-away sheath was placed and a 37 cm, 5 [place], dual lumen was inserted to level of the superior caval-atrial junction. A post procedure spot fluoroscopic was obtained. The catheter easily aspirated and flushed and was sutured in place. A dressing was placed. The patient tolerated the procedure well without immediate post procedural complication.  FINDINGS: After catheter placement, the tip lies within the superior cavoatrial junction. The catheter aspirates and flushes normally and is ready for immediate use.  IMPRESSION: Successful ultrasound and fluoroscopic guided placement of a right basilic vein approach, 37 cm, 5 French, dual lumen PICC with tip at the superior caval-atrial junction. The PICC line is ready for immediate use.  Read by:  Lavonia Drafts [facility]   Electronically  Signed   By: Corrie Mckusick D.O.   On: 04/03/2015 15:49   Ir US Guide Vasc Access Right  04/03/2015   INDICATION: CHF  EXAM: ULTRASOUND AND FLUOROSCOPIC GUIDED PICC LINE INSERTION  MEDICATIONS: None  CONTRAST:  None  FLUOROSCOPY TIME:  60 seconds.  COMPLICATIONS: None immediate  TECHNIQUE: The procedure, risks, benefits, and alternatives were explained to the patient and informed written consent was obtained. A timeout was performed prior to the initiation of the procedure.  The right upper extremity was prepped with chlorhexidine in a sterile fashion, and a sterile drape was applied covering the operative field. Maximum barrier sterile technique with sterile gowns and gloves were used for the procedure. A timeout was performed prior to the initiation of the procedure. Local anesthesia was provided with 1% lidocaine.  Under direct ultrasound guidance, the right basilic vein was accessed with a micropuncture kit after the overlying soft tissues were anesthetized with 1% lidocaine. An ultrasound image was saved for documentation purposes. A guidewire  was advanced to the level of the superior caval-atrial junction for measurement purposes and the PICC line was cut to length. A peel-away sheath was placed and a 37 cm, 5 [place], dual lumen was inserted to level of the superior caval-atrial junction. A post procedure spot fluoroscopic was obtained. The catheter easily aspirated and flushed and was sutured in place. A dressing was placed. The patient tolerated the procedure well without immediate post procedural complication.  FINDINGS: After catheter placement, the tip lies within the superior cavoatrial junction. The catheter aspirates and flushes normally and is ready for immediate use.  IMPRESSION: Successful ultrasound and fluoroscopic guided placement of a right basilic vein approach, 37 cm, 5 French, dual lumen PICC with tip at the superior caval-atrial junction. The PICC line is ready for immediate use.  Read by:   Lavonia Drafts [facility]   Electronically Signed   By: Corrie Mckusick D.O.   On: 04/03/2015 15:49   Dg Chest Portable 1 View  04/02/2015   CLINICAL DATA:  Lactic acidosis.  EXAM: PORTABLE CHEST 1 VIEW  COMPARISON:  03/27/2015  FINDINGS: The PICC has been removed. Chronic cardiomegaly. Slight pulmonary vascular prominence without infiltrates or effusions. Slight pulmonary edema present on the prior exam has resolved.  IMPRESSION: PICC line has been removed. Persistent cardiomegaly. Improved pulmonary vascular congestion.   Electronically Signed   By: Lorriane Shire M.D.   On: 04/02/2015 12:32   Dg Abd Portable 1v  04/03/2015   CLINICAL DATA:  Vomiting.  History of diabetes.  EXAM: PORTABLE ABDOMEN - 1 VIEW  COMPARISON:  CT abdomen and pelvis Nov 09, 2013  FINDINGS: Paucity of bowel gas. Small amount of gas in the colon with stool density projecting at the rectum. Vascular stent projects in RIGHT Common iliac artery. No intra-abdominal mass effect or pathologic calcifications. Moderate aortoiliac vascular calcifications. 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  IMPRESSION: Nonspecific bowel gas pattern.   Electronically Signed   By: Elon Alas M.D.   On: 04/03/2015 02:22      Medications:     Scheduled Medications: . atorvastatin  40 mg Oral q1800  . buPROPion  150 mg Oral BID  . clopidogrel  75 mg Oral Daily  . DULoxetine  30 mg Oral Daily  . ferrous sulfate  325 mg Oral Q breakfast  . insulin aspart  0-9 Units Subcutaneous TID WC  . insulin glargine  20 Units Subcutaneous BID  . lipase/protease/amylase  36,000 Units Oral TID AC  . olopatadine  1 drop Both Eyes BID  . pantoprazole (PROTONIX) IV  40 mg Intravenous Q24H  . sodium chloride  10-40 mL Intracatheter Q12H  . torsemide  20 mg Oral BID     Infusions: . dextrose 5 % and 0.9% NaCl 30 mL/hr (04/03/15 1427)  . milrinone 0.375 mcg/kg/min (04/04/15 0751)     PRN Medications:  albuterol,  bisacodyl, chlorproMAZINE (THORAZINE) IV, ondansetron **OR** ondansetron (ZOFRAN) IV, sodium chloride   Assessment:   1. Hypoglycemia- uncontrolled DM 2. Chronic Systolic Heart Failure on milrinone 3. CKD Stage III 4. Smoker  5. PAD Stent placement R common iliac 02/2015 - on plavix  6. Delirium, acute on chronic   Plan/Discussion:    From HF perspective he is stable. Ongoing confusion. Continue current HF meds. Check BMET .   Length of Stay: 2   CLEGG,AMY NP-C  04/04/2015, 9:28 AM  Advanced Heart Failure Team Pager [phone] (M-F; 7a - 4p)  Please  contact [name] for night-coverage after hours (4p -7a ) and weekends on [email or web address]  HF remains relatively stable but continues to have delirium. Had had extensive w/u which has been negative. I am unclear of the etiology. Doubt this is related to milrinone as he has been on it previously without problems. Agree with considering brain MRI. May be reasonable to consider  Psych consult or referral to tertiary center for further evaluation.   Can give additional diuretics as needed.   Bensimhon, Daniel,MD 10:22 AM

## 2015-04-04 NOTE — Progress Notes (Signed)
CSW spoke with pt wife- wife is agreeable to pt return to [facility].  Facility is able to accept pt back when stable for DC.  CSW will continue to follow.  Merlyn Lot, LCSWA Clinical Social Worker [phone]

## 2015-04-04 NOTE — Consult Note (Addendum)
Consultation Note Date: 04/04/2015   Patient Name: Christian Wall  DOB: 05-Apr-1961  MRN: [id number]  Age / Sex: 54 y.o., male   PCP: Nolene Ebbs, MD Referring Physician: Cherene Altes, MD  Reason for Consultation: Establishing goals of care  Palliative Care Assessment and Plan Summary of Established Goals of Care and Medical Treatment Preferences   Clinical Assessment/Narrative: [name] is a 54 year old male with history of chronic systolic heart failure on milrinone, polysubstance abuse, diabetes, peripheral neuropathy and 6 hospitalizations in the last 6 months. He was most recently admitted for altered mental status and was discharged to [facility].  He is brought to [place] long ED due to the fact his PICC line had become dislodged. Subsequently transferred to [facility]. Palliative care was consulted to continue discussions regarding goals of care moving forward.  I met with Christian Wall and he reports that he is feeling okay this morning. He is alone in the room. He remains poor historian and cannot relay events that brought him to the hospital. He did appear somewhat confused and very distant during encounter. He noted that I will need to stop back whenever his wife is present to have any discussions regarding his goals moving forward.  I called his wife, Loletha Carrow, to try to set up a family meeting.  She reports that she and her husband do not want to meet with our team at this time. She remained very polite during phone call, but reported "I just have too much going on to even think about talking with you right now."    She reports that things did not go well at skilled nursing facility where he was prior to this admission. She would not relay any specifics.  I asked if the doctors have been doing a good job explaining things to her again this visit. She reports that they have, but she has not been able to touch base with anybody from the heart failure team since he has been  admitted. She is anxious to talk to them and hopes to touch base with them later this afternoon.  At this point, she reported she was finished talking and suggested that I check in a couple of days to see how they are doing at that time.  I confirmed that she has our contact number, and I let her know that I'll be happy to meet with her at any point if I can offer any assistance to her or her husband.  - The patient has deferred to his wife, and she is not wanting to meet with palliative care at this time. I did review with her that the role of palliative care is to provide support at any point in serious illness and not just end-of-life care. She reports that she will call if she has further questions. I will check in on Christian Wall and his wife later this week if he is still inpatient. Please let us know if we can be of further assistance in the meantime.  Contacts/Participants in Discussion: Primary Decision Maker: The patient and his wife  HCPOA: none on chart   Code Status/Advance Care Planning:  Full code  Symptom Management:   Confusion: This is been ongoing since his last hospitalization. He was oriented to self and place during my examination this morning. He deferred any further questions to his wife. She is not present at this time.  Additional Recommendations (Limitations, Scope, Preferences):  I met several times to discuss goals of care with  the patient and his wife during his last hospitalization. I recommended completion of a MOST form prior to discharge but they did not want to complete at that time. I do still think this would be beneficial to him in the long run. His wife declined to meet with me when I called her today to discuss.  Psycho-social/Spiritual:   Support System: Family  Desire for further Chaplaincy support:no  Prognosis: Unable to determine but with his milrinone dependence he would likely qualify for hospice support with a life expectancy of less  than 6 months   Discharge Planning: To be determined       Chief Complaint/History of Present Illness:  Patient is 54 year old male with multiple comorbid medical conditions including systolic heart failure (on milrinone), resident of skilled nursing facility, diabetes mellitus, bed bound due to severe atrophy of bilateral calves, peripheral neuropathy, recently discharged from hospital on 03/29/2015 after being hospitalized for acute encephalopathy.  He presented to [facility] long ED was subsequent transferred to [facility] after having PICC line dislodged at his skilled nursing facility.  Primary Diagnoses  Present on Admission:  . Acute respiratory failure with hypoxia  . Diabetes mellitus type II, uncontrolled . Acute on chronic systolic CHF (congestive heart failure) . Acute renal failure superimposed on stage 3 chronic kidney disease . Tobacco abuse . Diabetic nephropathy . Hypotension . Anemia of chronic disease . Cigarette smoker . Transaminitis . Metabolic encephalopathy . Acute respiratory failure with hypoxia . Vomiting . Intractable hiccups  Palliative Review of Systems: He denies symptoms at this time. He has been noted to have some vomiting per chart. I have reviewed the medical record, interviewed the patient and family, and examined the patient. The following aspects are pertinent.  Past Medical History  Diagnosis Date  . Diabetes mellitus   . Atrophy of calf muscles   . Depression   . GERD (gastroesophageal reflux disease)   . Peripheral neuropathy   . Hypercholesteremia   . Hx of tracheostomy   . Chronic systolic CHF (congestive heart failure), NYHA class 3    Social History   Social History  . Marital Status: Married    Spouse Name: N/A  . Number of Children: N/A  . Years of Education: N/A   Occupational History  . disabled    Social History Main Topics  . Smoking status: Current Every Day Smoker -- 1.00 packs/day for 30 years    Types:  Cigarettes  . Smokeless tobacco: Never Used  . Alcohol Use: No     Comment: Quit after his brother's death in 09-11-05 or 8   . Drug Use: No  . Sexual Activity: Yes   Other Topics Concern  . None   Social History Narrative   Lives in [place] with wife, uses crutches or wheelchair to get around   Family History  Problem Relation Age of Onset  . Diabetes Mellitus II Sister   . CAD Brother   . Cancer Mother     ? type   . Cancer Father     ? type   Scheduled Meds: . atorvastatin  40 mg Oral q1800  . buPROPion  150 mg Oral BID  . clopidogrel  75 mg Oral Daily  . DULoxetine  30 mg Oral Daily  . ferrous sulfate  325 mg Oral Q breakfast  . insulin aspart  0-9 Units Subcutaneous TID WC  . insulin glargine  20 Units Subcutaneous BID  . lipase/protease/amylase  36,000 Units Oral TID AC  .  olopatadine  1 drop Both Eyes BID  . pantoprazole (PROTONIX) IV  40 mg Intravenous Q24H  . sodium chloride  10-40 mL Intracatheter Q12H  . torsemide  20 mg Oral BID   Continuous Infusions: . dextrose 5 % and 0.9% NaCl 30 mL/hr (04/03/15 1427)  . milrinone 0.375 mcg/kg/min (04/04/15 0751)   PRN Meds:.albuterol, bisacodyl, chlorproMAZINE (THORAZINE) IV, ondansetron **OR** ondansetron (ZOFRAN) IV, sodium chloride Medications Prior to Admission:  Prior to Admission medications   Medication Sig Start Date End Date Taking? Authorizing Provider  acetaminophen (TYLENOL) 500 MG tablet Take 2 tablets (1,000 mg total) by mouth every 6 (six) hours as needed for mild pain or headache. 11/21/14  Yes Rhonda G Barrett, PA-C  atorvastatin (LIPITOR) 40 MG tablet Take 1 tablet (40 mg total) by mouth daily at 6 PM. 03/01/15  Yes Amy D Clegg, NP  bisacodyl (DULCOLAX) 5 MG EC tablet Take 5 mg by mouth daily as needed for mild constipation or moderate constipation.   Yes Historical Provider, MD  buPROPion (WELLBUTRIN SR) 150 MG 12 hr tablet Take 150 mg by mouth 2 (two) times daily. 01/29/15  Yes Historical Provider, MD    clopidogrel (PLAVIX) 75 MG tablet Take 1 tablet (75 mg total) by mouth daily. 03/01/15  Yes Amy D Clegg, NP  [employer] VITAMIN B12 1000 MCG tablet Take 1,000 mcg by mouth daily. 10/15/14  Yes Historical Provider, MD  DULoxetine (CYMBALTA) 30 MG capsule Take 1 capsule (30 mg total) by mouth daily. 02/04/15  Yes Allie Bossier, MD  esomeprazole (NEXIUM) 40 MG capsule Take 40 mg by mouth daily before breakfast.   Yes Historical Provider, MD  ferrous sulfate 325 (65 FE) MG EC tablet Take 1 tablet (325 mg total) by mouth daily with breakfast. 03/08/15  Yes Amy D Clegg, NP  hydrALAZINE (APRESOLINE) 25 MG tablet Take 1 tablet (25 mg total) by mouth every 8 (eight) hours. 03/01/15  Yes Amy D Clegg, NP  insulin aspart (NOVOLOG) 100 UNIT/ML injection Inject 0-15 Units into the skin 3 (three) times daily with meals. CBG 70 - 120: 0 units CBG 121 - 150: 2 units CBG 151 - 200: 3 units CBG 201 - 250: 5 units CBG 251 - 300: 8 units CBG 301 - 350: 11 units CBG 351 - 400: 15 units 11/13/13  Yes Maryann Mikhail, DO  insulin glargine (LANTUS) 100 UNIT/ML injection Inject 0.2 mLs (20 Units total) into the skin 2 (two) times daily. 03/13/15  Yes Amy D Clegg, NP  isosorbide mononitrate (IMDUR) 30 MG 24 hr tablet Take 1 tablet (30 mg total) by mouth daily. 02/04/15  Yes Allie Bossier, MD  lipase/protease/amylase (CREON-10/PANCREASE) 12000 UNITS CPEP Take 36,000 Units by mouth 3 (three) times daily before meals.    Yes Historical Provider, MD  magnesium oxide (MAG-OX) 400 (241.3 MG) MG tablet Take 400 mg by mouth 2 (two) times daily. 01/26/15  Yes Historical Provider, MD  milrinone (PRIMACOR) 20 MG/100ML SOLN infusion Inject 31.8375 mcg/min into the vein continuous. Per [facility]. 12 months 03/29/15  Yes Verlee Monte, MD  Olopatadine HCl (PATADAY) 0.2 % SOLN Apply 1 drop to eye daily as needed. For allergies   Yes Historical Provider, MD  torsemide (DEMADEX) 20 MG tablet Take 1 tablet (20 mg total) by mouth 2 (two) times daily. Start the 20  mg twice a day. Start 9/6 at 6 pm 03/13/15  Yes Amy D Clegg, NP  VENTOLIN HFA 108 (90 BASE) MCG/ACT inhaler Inhale 1-2 puffs into  the lungs every 6 (six) hours as needed for wheezing or shortness of breath.  02/15/15  Yes Historical Provider, MD   No Known Allergies CBC:    Component Value Date/Time   WBC 7.6 04/03/2015 0329   HGB 8.9* 04/03/2015 1115   HCT 28.9* 04/03/2015 1115   PLT 273 04/03/2015 0329   MCV 67.3* 04/03/2015 0329   NEUTROABS 4.8 04/02/2015 1032   LYMPHSABS 1.3 04/02/2015 1032   MONOABS 0.6 04/02/2015 1032   EOSABS 0.0 04/02/2015 1032   BASOSABS 0.0 04/02/2015 1032   Comprehensive Metabolic Panel:    Component Value Date/Time   NA 138 04/03/2015 0945   K 4.1 04/03/2015 0945   CL 95* 04/03/2015 0945   CO2 33* 04/03/2015 0945   BUN 72* 04/03/2015 0945   CREATININE 1.97* 04/03/2015 0945   GLUCOSE 160* 04/03/2015 0945   CALCIUM 8.7* 04/03/2015 0945   CALCIUM 12.5* 04/17/2008 0710   AST 73* 04/03/2015 0945   ALT 69* 04/03/2015 0945   ALKPHOS 669* 04/03/2015 0945   BILITOT 2.7* 04/03/2015 0945   PROT 6.9 04/03/2015 0945   ALBUMIN 2.5* 04/03/2015 0945    Physical Exam: Vital Signs: BP 108/71 mmHg  Pulse 104  Temp(Src) 98 F (36.7 C) (Oral)  Resp 14  Ht _0  (1.626 m)  Wt 77.7 kg (171 lb 4.8 oz)  BMI 29.39 kg/m2  SpO2 97% SpO2: SpO2: 97 % O2 Device: O2 Device: Not Delivered O2 Flow Rate: O2 Flow Rate (L/min): 2 L/min Intake/output summary:  Intake/Output Summary (Last 24 hours) at 04/04/15 1032 Last data filed at 04/04/15 1000  Gross per 24 hour  Intake  466.5 ml  Output    900 ml  Net -433.5 ml   LBM: Last BM Date: 04/04/15 Baseline Weight: Weight: 81.647 kg (180 lb) Most recent weight: Weight: 77.7 kg (171 lb 4.8 oz)  Exam Findings:  Constitutional: Well-developed, lying in bed, no acute distress HENT: Normocephalic. Oropharynx is clear and moist.  Eyes: PERRLA, no scleral icterus.  Neck: Normal ROM. Neck supple. No JVD. No tracheal  deviation.   CVS: RRR, SEM 3/6 Pulmonary: Effort normal, no stridor, faint bibasilar crackles  Abdominal: Soft. BS +, no distension, tenderness, rebound or guarding.  Musculoskeletal: Wasting of lower extremity noted, +1 LE edema Neuro: Alert. No gross cranial nerve deficits noted. Skin: Skin is warm and dry. No rash. Psychiatric: He appears to be mildly confused, very distant during questioning..          Palliative Performance Scale: *40            Additional Data Reviewed: Recent Labs     04/02/15  1032  04/03/15  0329  04/03/15  0945  04/03/15  1115  WBC  6.7  7.6   --    --   HGB  11.4*  8.8*   --   8.9*  PLT  329  273   --    --   NA  137   --   138   --   BUN  64*   --   72*   --   CREATININE  2.31*   --   1.97*   --      Time In: 0800 Time Out: 0842 Time Total: 40 Greater than 50%  of this time was spent counseling and coordinating care related to the above assessment and plan.  Signed by: Micheline Rough, MD  Micheline Rough, MD  04/04/2015, 10:32 AM  Please contact Palliative Medicine  Team phone at [phone] for questions and concerns.

## 2015-04-04 NOTE — Progress Notes (Signed)
[place] TEAM 1 - Stepdown/ICU TEAM PROGRESS NOTE  Christian Wall PFY:[id number] DOB: July 10, 1960 DOA: 04/02/2015 PCP: Dorrene German, MD  Admit HPI / Brief Narrative: 54 year old M Hx Depression, Noncompliance w/ Medical Tx, PAD Chronic Systolic Heart Failure on Milrinone drip, recent resident of SNF , DM Type 2 uncontrolled, CKD stage III bed bound due to severe atrophy of bilateral calf muscles, peripheral neuropathies, discharged from hospital on 03/29/2015 after being hospitalized for acute encephalopathy that was determined to be secondary to medications including OxyIR, Lyrica, Zanaflex. NOTE; prior to that discharged from cardiology's service on 03/13/2015.  The pt presented to [facility] emergency department from skilled nursing facility after staff member noted PICC line to be dislodged. In emergency department, vital signs notable for blood pressure 89/76 with oxygen saturation 87% on room air, blood work notable for CR 2.31 and glucose 23.   HPI/Subjective: The pt remains altered.  He denies complaints, but clearly is not comprehending our conversation or able to provide a reliable hx.    Assessment/Plan:  Metabolic encephalopathy Extensive prior workup unrevealing  - obtain MRI brain as extensive w/u thus far o/w negative   Acute respiratory failure with hypoxia Resolved with saturations 97% on room air today  Acute on chronic systolic CHF NYHA III  management per heart failure team   Pulmonary hypertension  Acute renal failure superimposed on stage 3 chronic kidney disease -baseline Cr 1.29-3.22 -Follow creatinine - appears to now be stable within baseline range  Hypotension -Blood pressure improved and stable  Diabetes mellitus type II, uncontrolled with complications of Diabetic nephropathy and Peripheral neuropathy -CBG reasonably controlled  Cigarette smoker - Patient reports he quit 2 months ago  Transaminitis with elevated bilirubin -abd [place] suggest  possible cirrhosis- ammonia during recent hospital stay normal - recheck in a.m. -CMET in AM  Anemia of chronic disease - Hemoglobin stable and at baseline with no signs of bleeding - CBC in the morning  Stage IV sacral decubitus ulcer - Wound care consulted   Depression -Continue Cymbalta 30 mg daily  Hiccups/intractable nausea/vomiting -Thorazine appears to have resolved this issue  Palliative care  -Patient with multiple organ system failure, and frequent visits to the hospital discuss short-term and long-term goals of care   Code Status: FULL Family Communication: no family present at time of exam Disposition Plan: SDU  Consultants: CHF Team   Procedures: none  Antibiotics: None   DVT prophylaxis: SCDs  Objective: Blood pressure 109/70, pulse 103, temperature 97.5 F (36.4 C), temperature source Axillary, resp. rate 17, height 5\' 4"  (1.626 m), weight 77.7 kg (171 lb 4.8 oz), SpO2 97 %.  Intake/Output Summary (Last 24 hours) at 04/04/15 1454 Last data filed at 04/04/15 1000  Gross per 24 hour  Intake  466.5 ml  Output    900 ml  Net -433.5 ml   Exam: General: No acute respiratory distress Lungs: Clear to auscultation bilaterally without wheezes or crackles Cardiovascular: Regular rate and rhythm without murmur gallop or rub normal S1 and S2 Abdomen: Nontender, nondistended, soft, bowel sounds positive, no rebound, no ascites, no appreciable mass Extremities: No significant cyanosis, clubbing, or edema bilateral lower extremities  Data Reviewed: Basic Metabolic Panel:  Recent Labs Lab 03/29/15 0455 04/02/15 1032 04/03/15 0945  NA 139 137 138  K 5.1 4.4 4.1  CL 98* 98* 95*  CO2 30 24 33*  GLUCOSE 181* 23* 160*  BUN 56* 64* 72*  CREATININE 2.94* 2.31* 1.97*  CALCIUM 9.1 9.2 8.7*  MG  --   --  2.4    CBC:  Recent Labs Lab 03/29/15 0455 04/02/15 1032 04/03/15 0329 04/03/15 1115  WBC 9.0 6.7 7.6  --   NEUTROABS  --  4.8  --   --   HGB  8.6* 11.4* 8.8* 8.9*  HCT 27.6* 36.4* 28.8* 28.9*  MCV 67.2* 67.2* 67.3*  --   PLT 299 329 273  --     Liver Function Tests:  Recent Labs Lab 04/02/15 1032 04/03/15 0945  AST 105* 73*  ALT 81* 69*  ALKPHOS 857* 669*  BILITOT 3.4* 2.7*  PROT 8.8* 6.9  ALBUMIN 3.2* 2.5*   No results for input(s): LIPASE, AMYLASE in the last 168 hours. No results for input(s): AMMONIA in the last 168 hours.  Coags:  Recent Labs Lab 04/03/15 0329  INR 1.49    Recent Labs Lab 04/03/15 0329  APTT 35   CBG:  Recent Labs Lab 04/03/15 1301 04/03/15 1632 04/03/15 2103 04/04/15 0828 04/04/15 1212  GLUCAP 164* 185* 233* 171* 150*    Recent Results (from the past 240 hour(s))  Blood culture (routine x 2)     Status: None (Preliminary result)   Collection Time: 04/02/15 12:13 PM  Result Value Ref Range Status   Specimen Description BLOOD LEFT ANTECUBITAL  Final   Special Requests BOTTLES DRAWN AEROBIC AND ANAEROBIC  Final   Culture   Final    NO GROWTH 2 DAYS Performed at [facility]    Report Status PENDING  Incomplete  Blood culture (routine x 2)     Status: None (Preliminary result)   Collection Time: 04/02/15  1:25 PM  Result Value Ref Range Status   Specimen Description BLOOD LEFT HAND  Final   Special Requests IN PEDIATRIC BOTTLE 3 CC  Final   Culture   Final    NO GROWTH 2 DAYS Performed at [facility]    Report Status PENDING  Incomplete  Urine culture     Status: None   Collection Time: 04/02/15  7:35 PM  Result Value Ref Range Status   Specimen Description URINE, CLEAN CATCH  Final   Special Requests NONE  Final   Culture   Final    MULTIPLE SPECIES PRESENT, SUGGEST RECOLLECTION Performed at [facility]    Report Status 04/04/2015 FINAL  Final  MRSA PCR Screening     Status: None   Collection Time: 04/02/15  8:49 PM  Result Value Ref Range Status   MRSA by PCR NEGATIVE NEGATIVE Final    Comment:        The GeneXpert MRSA Assay  (FDA approved for NASAL specimens only), is one component of a comprehensive MRSA colonization surveillance program. It is not intended to diagnose MRSA infection nor to guide or monitor treatment for MRSA infections.      Studies:   Recent x-ray studies have been reviewed in detail by the Attending Physician  Scheduled Meds:  Scheduled Meds: . atorvastatin  40 mg Oral q1800  . buPROPion  100 mg Oral BID  . clopidogrel  75 mg Oral Daily  . DULoxetine  30 mg Oral Daily  . ferrous sulfate  325 mg Oral Q breakfast  . insulin aspart  0-9 Units Subcutaneous TID WC  . insulin glargine  20 Units Subcutaneous BID  . lipase/protease/amylase  36,000 Units Oral TID AC  . olopatadine  1 drop Both Eyes BID  . pantoprazole (PROTONIX) IV  40 mg Intravenous Q24H  . sodium chloride  10-40 mL  Intracatheter Q12H  . torsemide  20 mg Oral BID    Time spent on care of this patient: 35 mins   MCCLUNG,JEFFREY T , MD   [facility] Office  [phone] Pager - Text Page per Loretha Stapler as per below:  On-Call/Text Page:      [email or web address]      password [email or web address]  If 7PM-7AM, please contact night-coverage [email or web address] Password [email or web address] 04/04/2015, 2:54 PM   LOS: 2 days

## 2015-04-05 ENCOUNTER — Inpatient Hospital Stay (HOSPITAL_COMMUNITY): Payer: Medicare Other

## 2015-04-05 DIAGNOSIS — IMO0002 Reserved for concepts with insufficient information to code with codable children: Secondary | ICD-10-CM | POA: Diagnosis present

## 2015-04-05 DIAGNOSIS — R4182 Altered mental status, unspecified: Secondary | ICD-10-CM | POA: Diagnosis present

## 2015-04-05 DIAGNOSIS — E1165 Type 2 diabetes mellitus with hyperglycemia: Secondary | ICD-10-CM | POA: Diagnosis present

## 2015-04-05 DIAGNOSIS — R748 Abnormal levels of other serum enzymes: Secondary | ICD-10-CM | POA: Diagnosis present

## 2015-04-05 LAB — PROTIME-INR
INR: 1.42 (ref 0.00–1.49)
Prothrombin Time: 17.4 seconds — ABNORMAL HIGH (ref 11.6–15.2)

## 2015-04-05 LAB — CBC
HCT: 26.3 % — ABNORMAL LOW (ref 39.0–52.0)
HEMATOCRIT: 27.1 % — AB (ref 39.0–52.0)
HEMOGLOBIN: 8.3 g/dL — AB (ref 13.0–17.0)
Hemoglobin: 8.3 g/dL — ABNORMAL LOW (ref 13.0–17.0)
MCH: 20.7 pg — ABNORMAL LOW (ref 26.0–34.0)
MCH: 21.3 pg — ABNORMAL LOW (ref 26.0–34.0)
MCHC: 30.6 g/dL (ref 30.0–36.0)
MCHC: 31.6 g/dL (ref 30.0–36.0)
MCV: 67.6 fL — AB (ref 78.0–100.0)
MCV: 67.6 fL — ABNORMAL LOW (ref 78.0–100.0)
Platelets: 270 10*3/uL (ref 150–400)
Platelets: 280 10*3/uL (ref 150–400)
RBC: 3.89 MIL/uL — AB (ref 4.22–5.81)
RBC: 4.01 MIL/uL — AB (ref 4.22–5.81)
RDW: 20.2 % — ABNORMAL HIGH (ref 11.5–15.5)
RDW: 20.5 % — ABNORMAL HIGH (ref 11.5–15.5)
WBC: 1.8 10*3/uL — AB (ref 4.0–10.5)
WBC: 7.3 10*3/uL (ref 4.0–10.5)

## 2015-04-05 LAB — GLUCOSE, CAPILLARY
GLUCOSE-CAPILLARY: 104 mg/dL — AB (ref 65–99)
GLUCOSE-CAPILLARY: 121 mg/dL — AB (ref 65–99)
GLUCOSE-CAPILLARY: 41 mg/dL — AB (ref 65–99)
Glucose-Capillary: 122 mg/dL — ABNORMAL HIGH (ref 65–99)
Glucose-Capillary: 102 mg/dL — ABNORMAL HIGH (ref 65–99)

## 2015-04-05 LAB — COMPREHENSIVE METABOLIC PANEL
ALK PHOS: 627 U/L — AB (ref 38–126)
ALT: 48 U/L (ref 17–63)
ANION GAP: 10 (ref 5–15)
AST: 42 U/L — ABNORMAL HIGH (ref 15–41)
Albumin: 2.5 g/dL — ABNORMAL LOW (ref 3.5–5.0)
BILIRUBIN TOTAL: 2.7 mg/dL — AB (ref 0.3–1.2)
BUN: 69 mg/dL — ABNORMAL HIGH (ref 6–20)
CALCIUM: 8.7 mg/dL — AB (ref 8.9–10.3)
CO2: 30 mmol/L (ref 22–32)
Chloride: 96 mmol/L — ABNORMAL LOW (ref 101–111)
Creatinine, Ser: 1.87 mg/dL — ABNORMAL HIGH (ref 0.61–1.24)
GFR calc non Af Amer: 39 mL/min — ABNORMAL LOW (ref >60)
GFR, EST AFRICAN AMERICAN: 46 mL/min — AB (ref >60)
Glucose, Bld: 124 mg/dL — ABNORMAL HIGH (ref 65–99)
Potassium: 3.5 mmol/L (ref 3.5–5.1)
SODIUM: 136 mmol/L (ref 135–145)
TOTAL PROTEIN: 6.9 g/dL (ref 6.5–8.1)

## 2015-04-05 LAB — TROPONIN I: Troponin I: 0.07 ng/mL — ABNORMAL HIGH (ref <0.031)

## 2015-04-05 LAB — BLOOD GAS, ARTERIAL
Acid-Base Excess: 4.7 mmol/L — ABNORMAL HIGH (ref 0.0–2.0)
Bicarbonate: 28.5 mEq/L — ABNORMAL HIGH (ref 20.0–24.0)
DRAWN BY: 418751
FIO2: 100
O2 Saturation: 80.9 %
PH ART: 7.461 — AB (ref 7.350–7.450)
Patient temperature: 98.6
TCO2: 29.7 mmol/L (ref 0–100)
pCO2 arterial: 40.5 mmHg (ref 35.0–45.0)
pO2, Arterial: 50.5 mmHg — ABNORMAL LOW (ref 80.0–100.0)

## 2015-04-05 LAB — BASIC METABOLIC PANEL
ANION GAP: 7 (ref 5–15)
BUN: 66 mg/dL — ABNORMAL HIGH (ref 6–20)
CO2: 31 mmol/L (ref 22–32)
Calcium: 8.4 mg/dL — ABNORMAL LOW (ref 8.9–10.3)
Chloride: 100 mmol/L — ABNORMAL LOW (ref 101–111)
Creatinine, Ser: 1.85 mg/dL — ABNORMAL HIGH (ref 0.61–1.24)
GFR calc Af Amer: 46 mL/min — ABNORMAL LOW (ref >60)
GFR, EST NON AFRICAN AMERICAN: 40 mL/min — AB (ref >60)
GLUCOSE: 45 mg/dL — AB (ref 65–99)
POTASSIUM: 3 mmol/L — AB (ref 3.5–5.1)
Sodium: 138 mmol/L (ref 135–145)

## 2015-04-05 LAB — AMMONIA: Ammonia: 46 umol/L — ABNORMAL HIGH (ref 9–35)

## 2015-04-05 LAB — GAMMA GT: GGT: 619 U/L — AB (ref 7–50)

## 2015-04-05 LAB — LIPASE, BLOOD: LIPASE: 15 U/L — AB (ref 22–51)

## 2015-04-05 LAB — LACTIC ACID, PLASMA: Lactic Acid, Venous: 2 mmol/L (ref 0.5–2.0)

## 2015-04-05 LAB — MAGNESIUM: MAGNESIUM: 1.6 mg/dL — AB (ref 1.7–2.4)

## 2015-04-05 MED ORDER — POTASSIUM CHLORIDE 10 MEQ/100ML IV SOLN
10.0000 meq | INTRAVENOUS | Status: AC
  Administered 2015-04-05 – 2015-04-06 (×4): 10 meq via INTRAVENOUS
  Filled 2015-04-05 (×4): qty 100

## 2015-04-05 MED ORDER — CEFTAZIDIME 2 G IJ SOLR
2.0000 g | Freq: Two times a day (BID) | INTRAMUSCULAR | Status: DC
  Administered 2015-04-06 – 2015-04-08 (×6): 2 g via INTRAVENOUS
  Filled 2015-04-05 (×6): qty 2

## 2015-04-05 MED ORDER — DEXTROSE 10 % IV SOLN
INTRAVENOUS | Status: DC
  Administered 2015-04-05 – 2015-04-07 (×2): via INTRAVENOUS

## 2015-04-05 MED ORDER — MIDAZOLAM HCL 2 MG/2ML IJ SOLN: 2.0000 mg | Freq: Once | INTRAMUSCULAR | Status: DC

## 2015-04-05 MED ORDER — SODIUM CHLORIDE 0.9 % IV BOLUS (SEPSIS): 500.0000 mL | Freq: Once | INTRAVENOUS | Status: DC

## 2015-04-05 MED ORDER — PIPERACILLIN-TAZOBACTAM 3.375 G IVPB
3.3750 g | Freq: Three times a day (TID) | INTRAVENOUS | Status: DC
  Administered 2015-04-05: 3.375 g via INTRAVENOUS
  Filled 2015-04-05 (×2): qty 50

## 2015-04-05 MED ORDER — CHLORPROMAZINE HCL 25 MG/ML IJ SOLN: 25.0000 mg | Freq: Once | INTRAMUSCULAR | Status: DC

## 2015-04-05 MED ORDER — SODIUM CHLORIDE 0.9 % IV SOLN
12.5000 mg | Freq: Once | INTRAVENOUS | Status: AC
  Administered 2015-04-05: 12.5 mg via INTRAVENOUS
  Filled 2015-04-05: qty 0.5

## 2015-04-05 MED ORDER — INSULIN ASPART 100 UNIT/ML ~~LOC~~ SOLN
0.0000 [IU] | SUBCUTANEOUS | Status: DC
  Administered 2015-04-06: 1 [IU] via SUBCUTANEOUS
  Administered 2015-04-06: 2 [IU] via SUBCUTANEOUS
  Administered 2015-04-07: 3 [IU] via SUBCUTANEOUS
  Administered 2015-04-07 (×2): 2 [IU] via SUBCUTANEOUS
  Administered 2015-04-07 (×2): 3 [IU] via SUBCUTANEOUS
  Administered 2015-04-07: 2 [IU] via SUBCUTANEOUS
  Administered 2015-04-08: 1 [IU] via SUBCUTANEOUS
  Administered 2015-04-08: 3 [IU] via SUBCUTANEOUS
  Administered 2015-04-08: 2 [IU] via SUBCUTANEOUS
  Administered 2015-04-08 (×2): 3 [IU] via SUBCUTANEOUS
  Administered 2015-04-09 (×3): 1 [IU] via SUBCUTANEOUS
  Administered 2015-04-10 (×6): 2 [IU] via SUBCUTANEOUS
  Administered 2015-04-11: 5 [IU] via SUBCUTANEOUS
  Administered 2015-04-11: 2 [IU] via SUBCUTANEOUS
  Administered 2015-04-11: 3 [IU] via SUBCUTANEOUS
  Administered 2015-04-11: 2 [IU] via SUBCUTANEOUS

## 2015-04-05 MED ORDER — POTASSIUM CHLORIDE CRYS ER 20 MEQ PO TBCR
40.0000 meq | EXTENDED_RELEASE_TABLET | Freq: Once | ORAL | Status: DC
  Filled 2015-04-05: qty 2

## 2015-04-05 MED ORDER — VANCOMYCIN HCL IN DEXTROSE 750-5 MG/150ML-% IV SOLN
750.0000 mg | Freq: Two times a day (BID) | INTRAVENOUS | Status: DC
  Administered 2015-04-05: 750 mg via INTRAVENOUS
  Filled 2015-04-05 (×3): qty 150

## 2015-04-05 MED ORDER — LORAZEPAM 2 MG/ML IJ SOLN
2.0000 mg | Freq: Once | INTRAMUSCULAR | Status: AC
  Administered 2015-04-05: 2 mg via INTRAVENOUS
  Filled 2015-04-05: qty 1

## 2015-04-05 MED ORDER — DEXTROSE 50 % IV SOLN
1.0000 | INTRAVENOUS | Status: DC | PRN
  Administered 2015-04-05 – 2015-04-06 (×2): 50 mL via INTRAVENOUS
  Filled 2015-04-05: qty 50

## 2015-04-05 MED ORDER — PANTOPRAZOLE SODIUM 40 MG PO TBEC
40.0000 mg | DELAYED_RELEASE_TABLET | Freq: Every day | ORAL | Status: DC
  Filled 2015-04-05: qty 1

## 2015-04-05 NOTE — Progress Notes (Signed)
[facility] TEAM 1 - Stepdown/ICU TEAM Progress Note  Kutler Vanvranken EAV:[id number] DOB: January 06, 1961 DOA: 04/02/2015 PCP: Dorrene German, MD  Admit HPI / Brief Narrative: Patient is 54 year old BM PMHx Depression, Noncompliant Medical Tx, PAD Chronic Systolic and Diastolic Heart Failure on Milrinone drip, Resident of SNF , DM Type 2 uncontrolled, CKD stage III bed bound due to severe atrophy of bilateral calf, peripheral neuropathies, recently discharged from hospital on 03/29/2015 after being hospitalized for acute encephalopathy that was determined to be secondary to medications including OxyIR, Lyrica, Zanaflex. NOTE; prior to that discharged from cardiology's service on 03/13/2015  Now presented to [facility] emergency department from skilled [facility] after staff member noted PICC line to be dislodged. Please note that patient has PICC line for milrinone drip administration. Patient has somewhat poor historian and unable to provide details. He reports feeling weak and tired, denies chest pain or shortness of breath at this time, also denies specific abdominal or urinary concerns, no fevers or chills. He reports not eating much over the past week, has not been out of the bed.  In emergency department, vital signs notable for blood pressure 89/76 with oxygen saturation 87% on room air, blood work notable for CR 2.31 and glucose 23. TRH asked to admit to step down unit, per cardiology request will transfer to [facility] for further management.   HPI/Subjective: 9/29 A/O 2 (does not know where, when), positive hiccups,    Assessment/Plan: Metabolic encephalopathy -Brain MRI nondiagnostic for cause of encephalopathy  Acute respiratory failure with hypoxia -Resolved  Acute on chronic systolic CHF (congestive heart failure)NYHA III  - Continue Milrinone drip per cardiology  - continue Torsemide 20 mg BID  Pulmonary hypertension -See systolic CHF  Acute renal failure  superimposed on stage 3 chronic kidney disease - Creatinine at baseline ~2, was as high as 3 in the recent past - Now WNL  Hypotension - hold Hydralazine and Imdur until BP stabilizes   Diabetes mellitus type II, uncontrolled with complications of Diabetic nephropathy and Peripheral neuropathy -D5-NS 61ml/hr -continue Lantus 20 units  BID -Continue sensitive SSI   Cigarette smoker - Patient reports he quit 2 months ago - Place on nicotine patch if patient wants  Transaminitis with elevated bilirubin/elevated alkaline phosphatase -Unknown cause, Cymbalta?, Hepatitis?, HIV?, Primary biliary sclerosis?, -abd [place] suggest possible cirrhosis- ammonia during recent hospital stay normal  -Ammonia level slightly elevated. -Obtain anti-mitochondrial Ab, check acute hepatitis panel, GGT, lipase; after results obtained consider GI consult biopsy vs watchful waiting ?  Anemia of chronic disease - Hemoglobin stable and at baseline with no signs of bleeding - CBC in the morning  Stage IV sacral decubitus ulcer - Wound care consulted   Depression -Hold Cymbalta 30 mg daily -See transaminitis  Hiccups/intractable nausea/vomiting -Thorazine IV 12.5 mg 1  Palliative care  -Patient with multiple organ system failure, and frequent visits to the hospital discuss short-term and long-term goals of care -wife, not wanting to meet with palliative care at this time    Code Status: FULL Family Communication: no family present at time of exam Disposition Plan: ??    Consultants: Dr.Daniel R Bensimhon (heart failure) Dr.Gene Neale Burly (palliative care)   Procedure/Significant Events: 8/23 echocardiogram;- LVEF= 15%, Diffuse hypokinesis.- Doppler parameters c/w restrictive physiology, indicative decreased left ventricular diastolic compliance and/or increased left atrial pressure. - Pulmonary arteries:  PApeak pressure: 48 mm Hg (S). 9/29 MRI brain without contrast;-. Focal  encephalomalacia in the anterior right frontal lobe is chronic,  related to prior surgery.-No acute infarct, hemorrhage, mass  Culture   Antibiotics:   DVT prophylaxis:  Lovenox SQ   Devices    LINES / TUBES:      Continuous Infusions: . dextrose 5 % and 0.9% NaCl 30 mL/hr at 04/05/15 0213  . milrinone 0.375 mcg/kg/min (04/05/15 1541)    Objective: VITAL SIGNS: Temp: 98.4 F (36.9 C) (09/29 2000) Temp Source: Oral (09/29 2000) BP: 91/52 mmHg (09/29 2000) Pulse Rate: 119 (09/29 2019) SPO2; FIO2:   Intake/Output Summary (Last 24 hours) at 04/05/15 2120 Last data filed at 04/05/15 1900  Gross per 24 hour  Intake 1068.56 ml  Output    500 ml  Net 568.56 ml     Exam: General: A/O 2 (does not know where, when), positive hiccups, patient clearly confused, No acute respiratory distress Eyes: Negative headache, eye pain, double vision,negative scleral hemorrhage ENT: Negative Runny nose, negative ear pain, negative gingival bleeding, Neck:  Negative scars, masses, torticollis, lymphadenopathy, JVD Lungs: Clear to auscultation bilaterally without wheezes or crackles Cardiovascular: Regular rate and rhythm without murmur gallop or rub normal S1 and S2 Abdomen:negative abdominal pain, nondistended, positive soft, bowel sounds, no rebound, no ascites, no appreciable mass Extremities: No significant cyanosis, clubbing, or edema bilateral lower extremities Psychiatric:  Negative depression, negative anxiety, negative fatigue, negative mania  Neurologic:  Cranial nerves II through XII intact, tongue/uvula midline, all extremities muscle strength 5/5, sensation intact throughout,    Data Reviewed: Basic Metabolic Panel:  Recent Labs Lab 04/02/15 1032 04/03/15 0945 04/05/15 0601  NA 137 138 136  K 4.4 4.1 3.5  CL 98* 95* 96*  CO2 24 33* 30  GLUCOSE 23* 160* 124*  BUN 64* 72* 69*  CREATININE 2.31* 1.97* 1.87*  CALCIUM 9.2 8.7* 8.7*  MG  --  2.4  --    Liver  Function Tests:  Recent Labs Lab 04/02/15 1032 04/03/15 0945 04/05/15 0601  AST 105* 73* 42*  ALT 81* 69* 48  ALKPHOS 857* 669* 627*  BILITOT 3.4* 2.7* 2.7*  PROT 8.8* 6.9 6.9  ALBUMIN 3.2* 2.5* 2.5*    Recent Labs Lab 04/05/15 1245  LIPASE 15*    Recent Labs Lab 04/05/15 0614  AMMONIA 46*   CBC:  Recent Labs Lab 04/02/15 1032 04/03/15 0329 04/03/15 1115 04/05/15 0010 04/05/15 2042  WBC 6.7 7.6  --  7.3 1.8*  NEUTROABS 4.8  --   --   --   --   HGB 11.4* 8.8* 8.9* 8.3* 8.3*  HCT 36.4* 28.8* 28.9* 26.3* 27.1*  MCV 67.2* 67.3*  --  67.6* 67.6*  PLT 329 273  --  280 270   Cardiac Enzymes: No results for input(s): CKTOTAL, CKMB, CKMBINDEX, TROPONINI in the last 168 hours. BNP (last 3 results)  Recent Labs  03/11/15 1702 03/21/15 1200 04/02/15 1032  BNP 1184.5* 824.5* 1932.0*    ProBNP (last 3 results) No results for input(s): PROBNP in the last 8760 hours.  CBG:  Recent Labs Lab 04/04/15 1641 04/04/15 2201 04/05/15 0735 04/05/15 1216 04/05/15 1637  GLUCAP 136* 141* 104* 122* 102*    Recent Results (from the past 240 hour(s))  Blood culture (routine x 2)     Status: None (Preliminary result)   Collection Time: 04/02/15 12:13 PM  Result Value Ref Range Status   Specimen Description BLOOD LEFT ANTECUBITAL  Final   Special Requests BOTTLES DRAWN AEROBIC AND ANAEROBIC  Final   Culture   Final    NO GROWTH  3 DAYS Performed at [facility]    Report Status PENDING  Incomplete  Blood culture (routine x 2)     Status: None (Preliminary result)   Collection Time: 04/02/15  1:25 PM  Result Value Ref Range Status   Specimen Description BLOOD LEFT HAND  Final   Special Requests IN PEDIATRIC BOTTLE 3 CC  Final   Culture   Final    NO GROWTH 3 DAYS Performed at [facility]    Report Status PENDING  Incomplete  Urine culture     Status: None   Collection Time: 04/02/15  7:35 PM  Result Value Ref Range Status   Specimen  Description URINE, CLEAN CATCH  Final   Special Requests NONE  Final   Culture   Final    MULTIPLE SPECIES PRESENT, SUGGEST RECOLLECTION Performed at [facility]    Report Status 04/04/2015 FINAL  Final  MRSA PCR Screening     Status: None   Collection Time: 04/02/15  8:49 PM  Result Value Ref Range Status   MRSA by PCR NEGATIVE NEGATIVE Final    Comment:        The GeneXpert MRSA Assay (FDA approved for NASAL specimens only), is one component of a comprehensive MRSA colonization surveillance program. It is not intended to diagnose MRSA infection nor to guide or monitor treatment for MRSA infections.      Studies:  Recent x-ray studies have been reviewed in detail by the Attending Physician  Scheduled Meds:  Scheduled Meds: . buPROPion  100 mg Oral BID  . clopidogrel  75 mg Oral Daily  . ferrous sulfate  325 mg Oral Q breakfast  . insulin aspart  0-9 Units Subcutaneous TID WC  . insulin glargine  20 Units Subcutaneous BID  . lipase/protease/amylase  36,000 Units Oral TID AC  . midazolam  2 mg Intravenous Once  . olopatadine  1 drop Both Eyes BID  . pantoprazole  40 mg Oral Daily  . piperacillin-tazobactam (ZOSYN)  IV  3.375 g Intravenous 3 times per day  . potassium chloride  40 mEq Oral Once  . sodium chloride  500 mL Intravenous Once  . torsemide  20 mg Oral BID  . vancomycin  750 mg Intravenous Q12H    Time spent on care of this patient: 40 mins   WOODS, Roselind Messier , MD  [facility] Office  [phone] Pager - [phone]  On-Call/Text Page:      [email or web address]      password [email or web address]  If 7PM-7AM, please contact night-coverage [email or web address] Password [email or web address] 04/05/2015, 9:20 PM   LOS: 3 days   Care during the described time interval was provided by me .  I have reviewed this patient's available data, including medical history, events of note, physical examination, and all test results as part of my evaluation. I have personally reviewed and  interpreted all radiology studies.   Carolyne Littles, MD [phone] Pager

## 2015-04-05 NOTE — Progress Notes (Addendum)
Hypoglycemic Event  CBG: 42  Treatment: D50 IV 50 mL  Symptoms: Sweaty  Follow-up CBG: Time:1015pm CBG Result:121 Possible Reasons for Event: inadequate meal intake   Comments/MD notified:Kirby, NP made aware.     Cresenciano Lick M  Remember to initiate Hypoglycemia Order Set & complete

## 2015-04-05 NOTE — Progress Notes (Addendum)
Shift event: RN paged this NP after assessing pt upon shift change. Pt just returned from MRI brain and received 2mg  Ativan and 2mg  Versed for sedation there. O2 sat in the 80s on RA and up to 85% after NRB applied. Pt with same baseline mental status as during hospitalization. + tachycardic and soft BP. T 98.7 ax.  ABG, CXR and labs ordered. NP to bedside. S: Pt is unable to participate in extensive ROS due to mental status (encephalopathy). He does deny pain and endorses SOB. O: Acutely ill appearing AAM in mild respiratory distress. T 98.7 ax. HR 120s. BP 90s. O2 sat 85-88 on NRB. RT at bedside getting ABG. Pt is sleepy, knows his name and that he is in hospital (in review of chart, this seems about baseline since admission). Card: Reg rhythm, tachycardic. Respiration effort slightly pronounced. RR 31. Lung with good air exchange but crackles at bases. Skin warm and dry. No focal neuro deficits other than orientation.  A/P: 1. Hypoxia-likely due to sedation in MRI and new finding of PNA. ABG-ph 7.4, PCO2 40, PO2 50. Pt placed on Bipap with subsequent O2 sat 100%. CXR shows increasing infiltrate bilat R>L. Empiric abx for HCAP started-Vanc and Fortaz. Blood cx's drawn. LA 2.0. Troponin .07 and will continue to cycle. EKG without noted acute changes. BMP, CBC ordered. K 3.0-4 runs ordered. Creat about the same. CBC stable.  2. Sepsis-meets criteria with new PNA, hypotension, tachycardia, tachypnea, hypoxia. LA is only 2 and will continue to cycle. Can not give aggressive IVF due to severe CHF. Follow blood cultures. Abx.  3. CHF/pulm HTN-followed by cards. Per note, improving. On Milrinone and Torsemide. Strict intake and output.  4. Hypoglycemia with hx DM II, uncontrolled-? secondary to sepsis and NPO status. D10 at 30cc/hr. D50 as needed. CBG q4. D/c Lantus for now.  5. Encephalopathy-MRI brain neg for acute findings.  6. AKI on CKD-stable. Strict I&O. Pt has condom cath.  7. Elevated troponin-no chest  pain. Likely combination of CHF and AKI/CKD. Will cycle.  Wife called twice by this NP to update on new findings and additional treatment plan.  Jimmye Norman, NP [facility] Addendum: Pt tolerating Bipap well when rechecked. Addendum: Later, pt became more hypotensive in the 60s. Pt started on Levophed and transferred to 37M ICU. PCCM notified and are taking over care of the pt. Case discussed with Dr. Darrick Penna and Charlcie Cradle, PA-C from [facility].   Last MAP after transfer was 68.  Wife called again to update.  KJKG, NP [facility]

## 2015-04-05 NOTE — Progress Notes (Addendum)
ANTIBIOTIC CONSULT NOTE - INITIAL  Pharmacy Consult for Vancomycin / Zosyn > Elita Quick  Indication: pneumonia  No Known Allergies  Patient Measurements: Height:  (162.6 cm) Weight: 170 lb 13.7 oz (77.5 kg) IBW/kg (Calculated) : 59.2   Vital Signs: Temp: 98.4 F (36.9 C) (09/29 2000) Temp Source: Oral (09/29 2000) BP: 91/52 mmHg (09/29 2000) Pulse Rate: 119 (09/29 2019) Intake/Output from previous day: 09/28 0701 - 09/29 0700 In: 1220.2 [I.V.:1220.2] Out: -  Intake/Output from this shift:    Labs:  Recent Labs  04/03/15 0329 04/03/15 0945 04/03/15 1115 04/05/15 0010 04/05/15 0601 04/05/15 2042  WBC 7.6  --   --  7.3  --  1.8*  HGB 8.8*  --  8.9* 8.3*  --  8.3*  PLT 273  --   --  280  --  270  CREATININE  --  1.97*  --   --  1.87*  --    Estimated Creatinine Clearance: 43 mL/min (by C-G formula based on Cr of 1.87). No results for input(s): VANCOTROUGH, VANCOPEAK, VANCORANDOM, GENTTROUGH, GENTPEAK, GENTRANDOM, TOBRATROUGH, TOBRAPEAK, TOBRARND, AMIKACINPEAK, AMIKACINTROU, AMIKACIN in the last 72 hours.   Microbiology: Recent Results (from the past 720 hour(s))  Culture, blood (routine x 2)     Status: None   Collection Time: 03/11/15  5:00 PM  Result Value Ref Range Status   Specimen Description BLOOD BLOOD LEFT HAND  Final   Special Requests IN PEDIATRIC BOTTLE 4CC  Final   Culture NO GROWTH 5 DAYS  Final   Report Status 03/16/2015 FINAL  Final  Culture, blood (routine x 2)     Status: None   Collection Time: 03/21/15  5:44 PM  Result Value Ref Range Status   Specimen Description BLOOD LEFT ARM  Final   Special Requests BOTTLES DRAWN AEROBIC AND ANAEROBIC 10CC  Final   Culture NO GROWTH 5 DAYS  Final   Report Status 03/26/2015 FINAL  Final  Urine culture     Status: None   Collection Time: 03/21/15  6:33 PM  Result Value Ref Range Status   Specimen Description URINE, CLEAN CATCH  Final   Special Requests NONE  Final   Culture MULTIPLE SPECIES PRESENT,  SUGGEST RECOLLECTION  Final   Report Status 03/23/2015 FINAL  Final  Culture, blood (routine x 2)     Status: None   Collection Time: 03/21/15  7:40 PM  Result Value Ref Range Status   Specimen Description BLOOD LEFT HAND  Final   Special Requests IN PEDIATRIC BOTTLE 2CC  Final   Culture NO GROWTH 5 DAYS  Final   Report Status 03/26/2015 FINAL  Final  Blood culture (routine x 2)     Status: None (Preliminary result)   Collection Time: 04/02/15 12:13 PM  Result Value Ref Range Status   Specimen Description BLOOD LEFT ANTECUBITAL  Final   Special Requests BOTTLES DRAWN AEROBIC AND ANAEROBIC  Final   Culture   Final    NO GROWTH 3 DAYS Performed at [facility]    Report Status PENDING  Incomplete  Blood culture (routine x 2)     Status: None (Preliminary result)   Collection Time: 04/02/15  1:25 PM  Result Value Ref Range Status   Specimen Description BLOOD LEFT HAND  Final   Special Requests IN PEDIATRIC BOTTLE 3 CC  Final   Culture   Final    NO GROWTH 3 DAYS Performed at [facility]    Report Status PENDING  Incomplete  Urine culture     Status: None   Collection Time: 04/02/15  7:35 PM  Result Value Ref Range Status   Specimen Description URINE, CLEAN CATCH  Final   Special Requests NONE  Final   Culture   Final    MULTIPLE SPECIES PRESENT, SUGGEST RECOLLECTION Performed at [facility]    Report Status 04/04/2015 FINAL  Final  MRSA PCR Screening     Status: None   Collection Time: 04/02/15  8:49 PM  Result Value Ref Range Status   MRSA by PCR NEGATIVE NEGATIVE Final    Comment:        The GeneXpert MRSA Assay (FDA approved for NASAL specimens only), is one component of a comprehensive MRSA colonization surveillance program. It is not intended to diagnose MRSA infection nor to guide or monitor treatment for MRSA infections.     Medical History: Past Medical History  Diagnosis Date  . Diabetes mellitus   . Atrophy of calf muscles    . Depression   . GERD (gastroesophageal reflux disease)   . Peripheral neuropathy   . Hypercholesteremia   . Hx of tracheostomy   . Chronic systolic CHF (congestive heart failure), NYHA class 3      Assessment: 53yom with HF and on home milrinone drip, admitted with confusion and PICC line had been pulled out - now replaced.  He had respiratory distress with O2sats in 70s now improved, Cxr RLL > LLL infiltrate.  Treat for HCAP.   Goal of Therapy:  Vancomycin trough level 15-20 mcg/ml  Plan:  Vancomycin 750mg  q12 Zosyn 3.375 GM iv q8 EI  Leota Sauers Pharm.D. CPP, BCPS Clinical Pharmacist [phone] 04/05/2015 9:05 PM   PM stop zosyn  Elita Quick 2gm q12  Leota Sauers Pharm.D. CPP, BCPS Clinical Pharmacist [phone] 04/05/2015 10:07 PM

## 2015-04-05 NOTE — Progress Notes (Signed)
Advanced Heart Failure Rounding Note   Subjective:    Admitted with hypoglycemia and replacement of PICC.   Denies SOB. Remains confused. Weight down another pound.     Objective:   Weight Range:  Vital Signs:   Temp:  [97.4 F (36.3 C)-98 F (36.7 C)] 97.6 F (36.4 C) (09/29 0732) Pulse Rate:  [97-105] 105 (09/29 0732) Resp:  [12-19] 13 (09/29 0732) BP: (88-115)/(61-86) 102/68 mmHg (09/29 0732) SpO2:  [97 %-99 %] 99 % (09/29 0732) Weight:  [170 lb 13.7 oz (77.5 kg)] 170 lb 13.7 oz (77.5 kg) (09/29 0500) Last BM Date: 04/04/15  Weight change: Filed Weights   04/03/15 0257 04/04/15 0500 04/05/15 0500  Weight: 171 lb 1.2 oz (77.6 kg) 171 lb 4.8 oz (77.7 kg) 170 lb 13.7 oz (77.5 kg)    Intake/Output:   Intake/Output Summary (Last 24 hours) at 04/05/15 0955 Last data filed at 04/05/15 0926  Gross per 24 hour  Intake 1345.04 ml  Output    500 ml  Net 845.04 ml   Physical Exam: General: Fatigued appearing. No resp difficulty. Sitting on the side of the bed. Marland Kitchen  HEENT: normal Neck: supple. JVP ~10 . Carotids 2+ bilat; no bruits. No lymphadenopathy or thryomegaly appreciated. Cor: PMI nondisplaced. Tachy Regular rate & rhythm. No rubs, or murmurs. + S3  Lungs: clear Abdomen: soft, nontender, nondistended. No hepatosplenomegaly. No bruits or masses. Good bowel sounds. Extremities: no cyanosis, clubbing, rash, edema. RUE double lumen PICC  Neuro: alert & oriented x1 person. Cranial nerves grossly intact. moves all 4 extremities w/o difficulty. +past-pointing on finger to nose Affect pleasant  Telemetry: Sinus Tach 110s   Labs: Basic Metabolic Panel:  Recent Labs Lab 04/02/15 1032 04/03/15 0945 04/05/15 0601  NA 137 138 136  K 4.4 4.1 3.5  CL 98* 95* 96*  CO2 24 33* 30  GLUCOSE 23* 160* 124*  BUN 64* 72* 69*  CREATININE 2.31* 1.97* 1.87*  CALCIUM 9.2 8.7* 8.7*  MG  --  2.4  --     Liver Function Tests:  Recent Labs Lab 04/02/15 1032  04/03/15 0945 04/05/15 0601  AST 105* 73* 42*  ALT 81* 69* 48  ALKPHOS 857* 669* 627*  BILITOT 3.4* 2.7* 2.7*  PROT 8.8* 6.9 6.9  ALBUMIN 3.2* 2.5* 2.5*   No results for input(s): LIPASE, AMYLASE in the last 168 hours.  Recent Labs Lab 04/05/15 0614  AMMONIA 46*    CBC:  Recent Labs Lab 04/02/15 1032 04/03/15 0329 04/03/15 1115 04/05/15 0010  WBC 6.7 7.6  --  7.3  NEUTROABS 4.8  --   --   --   HGB 11.4* 8.8* 8.9* 8.3*  HCT 36.4* 28.8* 28.9* 26.3*  MCV 67.2* 67.3*  --  67.6*  PLT 329 273  --  280    Cardiac Enzymes: No results for input(s): CKTOTAL, CKMB, CKMBINDEX, TROPONINI in the last 168 hours.  BNP: BNP (last 3 results)  Recent Labs  03/11/15 1702 03/21/15 1200 04/02/15 1032  BNP 1184.5* 824.5* 1932.0*    ProBNP (last 3 results) No results for input(s): PROBNP in the last 8760 hours.    Other results:  Imaging: US Abdomen Complete  04/03/2015   CLINICAL DATA:  Transaminitis  EXAM: ULTRASOUND ABDOMEN COMPLETE  COMPARISON:  Renal ultrasound 01/30/2015  FINDINGS: Gallbladder: Surgically absent  Common bile duct: Diameter: 3 mm  Liver: Lobulated appearance without focal mass lesion. Normal echogenicity. Small perihepatic ascites present. Antegrade flow in the imaged portal venous  system.  IVC: No abnormality visualized.  Pancreas: Not visualized  Spleen: Size and appearance within normal limits.  Right Kidney: Length: 10.5 cm. Pelviectasis, stable from abdominal CT 11/09/2013.  Left Kidney: Length: 11 cm. No hydronephrosis. No evidence of solid mass. Simple cysts are noted, 33 mm in the interpolar and 45 mm in the lower pole regions.  Abdominal aorta: No aneurysm visualized. Distal aorta not able to be seen but no aneurysm on 2015 CT.  Other findings: None.  IMPRESSION: 1. Lobulated appearance of the liver with small perihepatic ascites. Correlate for cirrhosis risk factors. 2. Chronic findings are stable from previous studies and described above. 3. Pancreas  could not be visualized.   Electronically Signed   By: Monte Fantasia M.D.   On: 04/03/2015 22:41   Ir Fluoro Guide Cv Line Right  04/03/2015   INDICATION: CHF  EXAM: ULTRASOUND AND FLUOROSCOPIC GUIDED PICC LINE INSERTION  MEDICATIONS: None  CONTRAST:  None  FLUOROSCOPY TIME:  60 seconds.  COMPLICATIONS: None immediate  TECHNIQUE: The procedure, risks, benefits, and alternatives were explained to the patient and informed written consent was obtained. A timeout was performed prior to the initiation of the procedure.  The right upper extremity was prepped with chlorhexidine in a sterile fashion, and a sterile drape was applied covering the operative field. Maximum barrier sterile technique with sterile gowns and gloves were used for the procedure. A timeout was performed prior to the initiation of the procedure. Local anesthesia was provided with 1% lidocaine.  Under direct ultrasound guidance, the right basilic vein was accessed with a micropuncture kit after the overlying soft tissues were anesthetized with 1% lidocaine. An ultrasound image was saved for documentation purposes. A guidewire was advanced to the level of the superior caval-atrial junction for measurement purposes and the PICC line was cut to length. A peel-away sheath was placed and a 37 cm, 5 [place], dual lumen was inserted to level of the superior caval-atrial junction. A post procedure spot fluoroscopic was obtained. The catheter easily aspirated and flushed and was sutured in place. A dressing was placed. The patient tolerated the procedure well without immediate post procedural complication.  FINDINGS: After catheter placement, the tip lies within the superior cavoatrial junction. The catheter aspirates and flushes normally and is ready for immediate use.  IMPRESSION: Successful ultrasound and fluoroscopic guided placement of a right basilic vein approach, 37 cm, 5 French, dual lumen PICC with tip at the superior caval-atrial junction. The  PICC line is ready for immediate use.  Read by:  Lavonia Drafts [facility]   Electronically Signed   By: Corrie Mckusick D.O.   On: 04/03/2015 15:49   Ir US Guide Vasc Access Right  04/03/2015   INDICATION: CHF  EXAM: ULTRASOUND AND FLUOROSCOPIC GUIDED PICC LINE INSERTION  MEDICATIONS: None  CONTRAST:  None  FLUOROSCOPY TIME:  60 seconds.  COMPLICATIONS: None immediate  TECHNIQUE: The procedure, risks, benefits, and alternatives were explained to the patient and informed written consent was obtained. A timeout was performed prior to the initiation of the procedure.  The right upper extremity was prepped with chlorhexidine in a sterile fashion, and a sterile drape was applied covering the operative field. Maximum barrier sterile technique with sterile gowns and gloves were used for the procedure. A timeout was performed prior to the initiation of the procedure. Local anesthesia was provided with 1% lidocaine.  Under direct ultrasound guidance, the right basilic vein was accessed with a micropuncture kit after the overlying soft  tissues were anesthetized with 1% lidocaine. An ultrasound image was saved for documentation purposes. A guidewire was advanced to the level of the superior caval-atrial junction for measurement purposes and the PICC line was cut to length. A peel-away sheath was placed and a 37 cm, 5 [place], dual lumen was inserted to level of the superior caval-atrial junction. A post procedure spot fluoroscopic was obtained. The catheter easily aspirated and flushed and was sutured in place. A dressing was placed. The patient tolerated the procedure well without immediate post procedural complication.  FINDINGS: After catheter placement, the tip lies within the superior cavoatrial junction. The catheter aspirates and flushes normally and is ready for immediate use.  IMPRESSION: Successful ultrasound and fluoroscopic guided placement of a right basilic vein approach, 37 cm, 5 French, dual lumen PICC with tip at  the superior caval-atrial junction. The PICC line is ready for immediate use.  Read by:  Lavonia Drafts [facility]   Electronically Signed   By: Corrie Mckusick D.O.   On: 04/03/2015 15:49     Medications:     Scheduled Medications: . buPROPion  100 mg Oral BID  . clopidogrel  75 mg Oral Daily  . DULoxetine  30 mg Oral Daily  . ferrous sulfate  325 mg Oral Q breakfast  . insulin aspart  0-9 Units Subcutaneous TID WC  . insulin glargine  20 Units Subcutaneous BID  . lipase/protease/amylase  36,000 Units Oral TID AC  . LORazepam  2 mg Intravenous Once  . olopatadine  1 drop Both Eyes BID  . pantoprazole (PROTONIX) IV  40 mg Intravenous Q24H  . torsemide  20 mg Oral BID    Infusions: . dextrose 5 % and 0.9% NaCl 30 mL/hr at 04/05/15 0213  . milrinone 0.375 mcg/kg/min (04/05/15 0210)    PRN Medications: albuterol, bisacodyl, ondansetron **OR** ondansetron (ZOFRAN) IV   Assessment:   1. Hypoglycemia- uncontrolled DM 2. Chronic Systolic Heart Failure on milrinone 3. CKD Stage III 4. Smoker  5. PAD Stent placement R common iliac 02/2015 - on plavix  6. Delirium, acute on chronic   Plan/Discussion:    From HF perspective he is stable. Ongoing confusion. Continue current HF meds. Renal function stable.  MRI Brain pending.  Length of Stay: 3   CLEGG,AMY NP-C  04/05/2015, 9:55 AM  Advanced Heart Failure Team Pager [phone] (M-F; [place])  Please contact [facility] for night-coverage after hours (4p -7a ) and weekends on [email or web address]  Patient seen and examined with Darrick Grinder, NP. We discussed all aspects of the encounter. I agree with the assessment and plan as stated above.   HF remains relatively stable but continues to have delirium. Had had extensive w/u which has been negative. I am unclear of the etiology. Doubt this is related to milrinone as he has been on it previously without problems. Agree with considering brain MRI. May be reasonable to considerPsych consult or  referral to tertiary center for further evaluation.   Can give additional diuretics as needed.   We will follow at a distance. Please call with questions.   Bensimhon, Daniel,MD 11:36 AM

## 2015-04-05 NOTE — Progress Notes (Signed)
After shift change pt noted to have low sats on NRB. With good wave form sats are upper 70's. Pt is confused, tachycardia, tachypnea with a BP of 84/53. NP made aware and in to see pt. ABG, chest x-ray, labs and EKG obtained. Pt placed on Bipap after ABG results. Sats are now 94% and wife was called and made aware. Will monitor pt closely.

## 2015-04-06 DIAGNOSIS — J9601 Acute respiratory failure with hypoxia: Secondary | ICD-10-CM

## 2015-04-06 DIAGNOSIS — I5023 Acute on chronic systolic (congestive) heart failure: Secondary | ICD-10-CM

## 2015-04-06 DIAGNOSIS — R6521 Severe sepsis with septic shock: Secondary | ICD-10-CM

## 2015-04-06 DIAGNOSIS — R0902 Hypoxemia: Secondary | ICD-10-CM | POA: Insufficient documentation

## 2015-04-06 DIAGNOSIS — R579 Shock, unspecified: Secondary | ICD-10-CM

## 2015-04-06 DIAGNOSIS — A419 Sepsis, unspecified organism: Secondary | ICD-10-CM

## 2015-04-06 DIAGNOSIS — N183 Chronic kidney disease, stage 3 (moderate): Secondary | ICD-10-CM

## 2015-04-06 DIAGNOSIS — N179 Acute kidney failure, unspecified: Secondary | ICD-10-CM

## 2015-04-06 LAB — HEPATITIS PANEL, ACUTE
HCV AB: 0.2 {s_co_ratio} (ref 0.0–0.9)
HEP B C IGM: NEGATIVE
HEP B S AG: NEGATIVE
Hep A IgM: NEGATIVE

## 2015-04-06 LAB — GLUCOSE, CAPILLARY
GLUCOSE-CAPILLARY: 105 mg/dL — AB (ref 65–99)
GLUCOSE-CAPILLARY: 120 mg/dL — AB (ref 65–99)
GLUCOSE-CAPILLARY: 133 mg/dL — AB (ref 65–99)
GLUCOSE-CAPILLARY: 287 mg/dL — AB (ref 65–99)
GLUCOSE-CAPILLARY: 32 mg/dL — AB (ref 65–99)
GLUCOSE-CAPILLARY: 49 mg/dL — AB (ref 65–99)
GLUCOSE-CAPILLARY: 78 mg/dL (ref 65–99)
Glucose-Capillary: 169 mg/dL — ABNORMAL HIGH (ref 65–99)
Glucose-Capillary: 114 mg/dL — ABNORMAL HIGH (ref 65–99)

## 2015-04-06 LAB — COMPREHENSIVE METABOLIC PANEL
ALBUMIN: 2.3 g/dL — AB (ref 3.5–5.0)
ALT: 40 U/L (ref 17–63)
ANION GAP: 10 (ref 5–15)
AST: 36 U/L (ref 15–41)
Alkaline Phosphatase: 541 U/L — ABNORMAL HIGH (ref 38–126)
BUN: 66 mg/dL — ABNORMAL HIGH (ref 6–20)
CHLORIDE: 95 mmol/L — AB (ref 101–111)
CO2: 29 mmol/L (ref 22–32)
Calcium: 8.3 mg/dL — ABNORMAL LOW (ref 8.9–10.3)
Creatinine, Ser: 2.11 mg/dL — ABNORMAL HIGH (ref 0.61–1.24)
GFR calc Af Amer: 40 mL/min — ABNORMAL LOW (ref >60)
GFR calc non Af Amer: 34 mL/min — ABNORMAL LOW (ref >60)
GLUCOSE: 167 mg/dL — AB (ref 65–99)
POTASSIUM: 3.2 mmol/L — AB (ref 3.5–5.1)
SODIUM: 134 mmol/L — AB (ref 135–145)
Total Bilirubin: 2.2 mg/dL — ABNORMAL HIGH (ref 0.3–1.2)
Total Protein: 6.4 g/dL — ABNORMAL LOW (ref 6.5–8.1)

## 2015-04-06 LAB — PROCALCITONIN: Procalcitonin: 21.32 ng/mL

## 2015-04-06 LAB — TROPONIN I
Troponin I: 0.14 ng/mL — ABNORMAL HIGH (ref <0.031)
Troponin I: 0.18 ng/mL — ABNORMAL HIGH (ref <0.031)

## 2015-04-06 LAB — CARBOXYHEMOGLOBIN
Carboxyhemoglobin: 1.7 % — ABNORMAL HIGH (ref 0.5–1.5)
METHEMOGLOBIN: 1 % (ref 0.0–1.5)
O2 Saturation: 42.8 %
TOTAL HEMOGLOBIN: 8 g/dL — AB (ref 13.5–18.0)

## 2015-04-06 LAB — AMMONIA: Ammonia: 49 umol/L — ABNORMAL HIGH (ref 9–35)

## 2015-04-06 LAB — MITOCHONDRIAL ANTIBODIES: MITOCHONDRIAL M2 AB, IGG: 11 U (ref 0.0–20.0)

## 2015-04-06 LAB — STREP PNEUMONIAE URINARY ANTIGEN: Strep Pneumo Urinary Antigen: NEGATIVE

## 2015-04-06 MED ORDER — SODIUM CHLORIDE 0.9 % IV BOLUS (SEPSIS): 250.0000 mL | Freq: Once | INTRAVENOUS | Status: DC

## 2015-04-06 MED ORDER — MAGNESIUM SULFATE 2 GM/50ML IV SOLN
2.0000 g | Freq: Once | INTRAVENOUS | Status: AC
  Administered 2015-04-06: 2 g via INTRAVENOUS
  Filled 2015-04-06: qty 50

## 2015-04-06 MED ORDER — LACTULOSE ENEMA
300.0000 mL | Freq: Once | ORAL | Status: AC
  Administered 2015-04-06: 300 mL via RECTAL
  Filled 2015-04-06: qty 300

## 2015-04-06 MED ORDER — NOREPINEPHRINE BITARTRATE 1 MG/ML IV SOLN
0.0000 ug/min | INTRAVENOUS | Status: DC
  Administered 2015-04-06: 15 ug/min via INTRAVENOUS
  Administered 2015-04-06: 23 ug/min via INTRAVENOUS
  Administered 2015-04-07: 13 ug/min via INTRAVENOUS
  Administered 2015-04-08: 10 ug/min via INTRAVENOUS
  Filled 2015-04-06 (×5): qty 16

## 2015-04-06 MED ORDER — VANCOMYCIN HCL IN DEXTROSE 1-5 GM/200ML-% IV SOLN
1000.0000 mg | Freq: Two times a day (BID) | INTRAVENOUS | Status: DC
  Administered 2015-04-06 – 2015-04-08 (×4): 1000 mg via INTRAVENOUS
  Filled 2015-04-06 (×5): qty 200

## 2015-04-06 MED ORDER — POTASSIUM CHLORIDE 10 MEQ/100ML IV SOLN
10.0000 meq | INTRAVENOUS | Status: AC
  Administered 2015-04-06 (×4): 10 meq via INTRAVENOUS
  Filled 2015-04-06 (×4): qty 100

## 2015-04-06 MED ORDER — PANTOPRAZOLE SODIUM 40 MG IV SOLR
40.0000 mg | INTRAVENOUS | Status: DC
  Administered 2015-04-06: 40 mg via INTRAVENOUS
  Filled 2015-04-06 (×2): qty 40

## 2015-04-06 MED ORDER — VASOPRESSIN 20 UNIT/ML IV SOLN
0.0300 [IU]/min | INTRAVENOUS | Status: DC
  Administered 2015-04-06 – 2015-04-08 (×3): 0.03 [IU]/min via INTRAVENOUS
  Filled 2015-04-06 (×3): qty 2

## 2015-04-06 MED ORDER — MILRINONE IN DEXTROSE 20 MG/100ML IV SOLN
0.3750 ug/kg/min | INTRAVENOUS | Status: DC
Start: 2015-04-06 — End: 2015-04-09
  Administered 2015-04-06 – 2015-04-09 (×8): 0.375 ug/kg/min via INTRAVENOUS
  Filled 2015-04-06 (×7): qty 100

## 2015-04-06 MED ORDER — DEXTROSE 5 % IV SOLN
0.0000 ug/min | INTRAVENOUS | Status: DC
  Administered 2015-04-06: 2 ug/min via INTRAVENOUS
  Filled 2015-04-06: qty 4

## 2015-04-06 NOTE — Progress Notes (Signed)
Report given to [facility], Charity fundraiser. Pt stable on Bipap and Levophed gtt. NP will update wife of pts status.  Mikaela M. Derrell Lolling, RN, BSN 04/06/2015 1:34 AM

## 2015-04-06 NOTE — Progress Notes (Signed)
Daily Progress Note   Patient Name: Christian Wall       Date: 04/06/2015 DOB: [birth year]  Age: 54 y.o. MRN#: [id number] Attending Physician: Oretha Milch, MD Primary Care Physician: Dorrene German, MD Admit Date: 04/02/2015  Reason for Consultation/Follow-up: Establishing goals of care  Subjective: 54 year old male with history of chronic systolic heart failure on milrinone, polysubstance abuse, diabetes, peripheral neuropathy and 6 hospitalizations in the last 6 months. He was most recently admitted for altered mental status and was discharged to [facility]. He is brought to [place] long ED due to the fact his PICC line had become dislodged. Subsequently transferred to [facility]. Palliative care was consulted to continue discussions regarding goals of care moving forward.  Interval Events: Overnight events noted, including transfer to 63M and vasopressor and inotropic support.  I visited with Christian Wall and his wife.  He remains confused, but is awake and verbalizes.  His wife reports that she understands that his condition is worsened,but reports that she is not emotionally at point where she can talk about it. "I know we need to talk.  I know this is not good.  But not right now.  Not today"  I provided sympathetic listening and support and ensured that she has my card to call if she would like to talk later today.   Length of Stay: 4 days  Current Medications: Scheduled Meds:  . cefTAZidime (FORTAZ)  IV  2 g Intravenous Q12H  . clopidogrel  75 mg Oral Daily  . ferrous sulfate  325 mg Oral Q breakfast  . insulin aspart  0-9 Units Subcutaneous Q4H  . olopatadine  1 drop Both Eyes BID  . pantoprazole (PROTONIX) IV  40 mg Intravenous Q24H  . vancomycin  1,000 mg Intravenous Q12H    Continuous Infusions: . dextrose 30 mL/hr at 04/06/15 1200  . milrinone 0.375 mcg/kg/min (04/06/15 1202)  . norepinephrine (LEVOPHED) Adult infusion 23 mcg/min (04/06/15 1207)  .  vasopressin (PITRESSIN) infusion - *FOR SHOCK* 0.03 Units/min (04/06/15 1200)    PRN Meds: albuterol, bisacodyl, dextrose, ondansetron **OR** ondansetron (ZOFRAN) IV  Palliative Performance Scale: 20%     Vital Signs: BP 98/73 mmHg  Pulse 121  Temp(Src) 97.9 F (36.6 C) (Oral)  Resp 26  Ht  (1.626 m)  Wt 74.617 kg (164 lb 8 oz)  BMI 28.22 kg/m2  SpO2 95% SpO2: SpO2: 95 % O2 Device: O2 Device: Nasal Cannula O2 Flow Rate: O2 Flow Rate (L/min): 4 L/min  Intake/output summary:  Intake/Output Summary (Last 24 hours) at 04/06/15 1528 Last data filed at 04/06/15 1203  Gross per 24 hour  Intake 2461.15 ml  Output    705 ml  Net 1756.15 ml   LBM:   Baseline Weight: Weight: 81.647 kg (180 lb) Most recent weight: Weight: 74.617 kg (164 lb 8 oz)  Physical Exam: Constitutional: Well-developed, lying in bed, no acute distress HENT: Normocephalic. Oropharynx is clear and moist.  Eyes: PERRLA, no scleral icterus.  Neck: Normal ROM. Neck supple. No JVD. No tracheal deviation.  CVS: tachycardic, SEM 3/6 Pulmonary: Effort normal, no stridor, faint bibasilar crackles  Abdominal: Soft. BS +, no distension, tenderness, rebound or guarding.  Musculoskeletal: Wasting of lower extremity noted, +1 LE edema Neuro: Alert. No gross cranial nerve deficits noted. Skin: Skin is warm and dry. No rash. Psychiatric: He appears to be mildly confused, very distant during questioning..            Additional Data Reviewed: Recent Labs  04/05/15  0010   04/05/15  2042  04/06/15  0500  WBC  7.3   --   1.8*   --   HGB  8.3*   --   8.3*   --   PLT  280   --   270   --   NA   --    < >  138  134*  BUN   --    < >  66*  66*  CREATININE   --    < >  1.85*  2.11*   < > = values in this interval not displayed.     Problem List:  Patient Active Problem List   Diagnosis Date Noted  . Altered mental state   . Diabetes type 2, uncontrolled   . Elevated alkaline phosphatase level   .  Metabolic encephalopathy   . Acute respiratory failure with hypoxia   . Vomiting   . Intractable hiccups   . Acute respiratory failure with hypoxia  04/02/2015  . Diabetic nephropathy 04/02/2015  . Hypotension 04/02/2015  . Anemia of chronic disease 04/02/2015  . Transaminitis 04/02/2015  . Confusion   . Goals of care, counseling/discussion   . Pulmonary hypertension   . Systolic murmur   . Right knee pain   . Acute encephalopathy 03/21/2015  . PAD (peripheral artery disease) 03/13/2015  . CKD (chronic kidney disease) stage 3, GFR 30-59 ml/min 03/13/2015  . Pressure ulcer 03/12/2015  . Acute combined systolic and diastolic heart failure, NYHA class 4 03/11/2015  . Shock, cardiogenic 02/23/2015  . Lower limb ischemia 02/23/2015  . Hospital acquired PNA 02/23/2015  . Acute on chronic systolic heart failure 02/23/2015  . Limb ischemia 02/21/2015  . Ischemic neuropathy of foot 02/21/2015  . HLD (hyperlipidemia)   . Acute renal failure superimposed on stage 3 chronic kidney disease   . Depression   . Tobacco abuse   . Acute on chronic systolic CHF (congestive heart failure)   . Cigarette smoker 12/19/2014  . Palliative care encounter 11/20/2014  . Diabetes mellitus type II, uncontrolled 11/13/2014  . Peripheral neuropathy 11/13/2014  . Non-adherence to medical treatment 11/10/2013  . Poor venous access 11/10/2013  . Esophageal thickening 11/10/2013  . Gastric wall thickening 11/10/2013  . Herniation of cervical intervertebral disc with radiculopathy 04/13/2012     Palliative Care Assessment & Plan    Code Status:  Full code  Goals of Care:  Continued aggressive care.  His wife has been having difficulty processing his illness.  She requests not to talk further again today.  Symptom Management:  Denies  Psycho-social/Spiritual:  Desire for further Chaplaincy support:no   Prognosis: Unable to determine due to acute illness. He remains critically ill with poor  prognosis Discharge Planning: To be determined.   Care plan was discussed with wife  Thank you for allowing the Palliative Medicine Team to assist in the care of this patient.   Time In: 0935 Time Out: 0955 Total Time 20 Prolonged Time Billed no    Greater than 50%  of this time was spent counseling and coordinating care related to the above assessment and plan.   Romie Minus, MD  04/06/2015, 3:28 PM  Please contact Palliative Medicine Team phone at [phone] for questions and concerns.

## 2015-04-06 NOTE — Procedures (Signed)
Arterial Catheter Insertion Procedure Note Christian Wall [id number] 1960-09-16  Procedure: Insertion of Arterial Catheter  Indications: Blood pressure monitoring  Procedure Details Consent: Unable to obtain consent because of emergent medical necessity. Time Out: Verified patient identification, verified procedure, site/side was marked, verified correct patient position, special equipment/implants available, medications/allergies/relevent history reviewed, required imaging and test results available.  Performed  Maximum sterile technique was used including antiseptics, cap, gloves, gown, hand hygiene, mask and sheet. Skin prep: Chlorhexidine; local anesthetic administered 20 gauge catheter was inserted into left radial artery using the Seldinger technique.  Evaluation Blood flow good; BP tracing good. Complications: No apparent complications.   Ave Filter 04/06/2015

## 2015-04-06 NOTE — Care Management Note (Signed)
Case Management Note  Patient Details  Name: Christian Wall MRN: [id number] Date of Birth: 1961-01-20  Subjective/Objective:    Decompensated post MRI and sedation requiring bipap and pressors.                 Action/Plan:   Expected Discharge Date:   (UNKNOWN)               Expected Discharge Plan:  Skilled Nursing Facility  In-House Referral:  Clinical Social Work  Discharge planning Services  CM Consult  Post Acute Care Choice:    Choice offered to:     DME Arranged:    DME Agency:     HH Arranged:    HH Agency:     Status of Service:  In process, will continue to follow  Medicare Important Message Given:    Date Medicare IM Given:    Medicare IM give by:    Date Additional Medicare IM Given:    Additional Medicare Important Message give by:     If discussed at Long Length of Stay Meetings, dates discussed:    Additional Comments:  Vangie Bicker, RN 04/06/2015, 8:28 AM

## 2015-04-06 NOTE — Progress Notes (Signed)
eLink Physician-Brief Progress Note Patient Name: Christian Wall DOB: [birth year] MRN: [id number]   Date of Service  04/06/2015  HPI/Events of Note  Hypokalemia  eICU Interventions  Potassium replaced     Intervention Category Intermediate Interventions: Electrolyte abnormality - evaluation and management  DETERDING,ELIZABETH 04/06/2015, 5:49 AM

## 2015-04-06 NOTE — Progress Notes (Signed)
NP made aware of pts hypotension, 65/51 (54). Will monitor.

## 2015-04-06 NOTE — Progress Notes (Signed)
Advanced Heart Failure Rounding Note   Subjective:    Admitted with hypoglycemia and replacement of PICC.   Overnight developed respiratory distress and hypotension. Placed on Bipap and started on noerpi titrated up to 23 mcg and vasopressin.  He continued on milrinone 0.375 mcg. CXR showed worsening RLL treating for HCAP.       Objective:   Weight Range:  Vital Signs:   Temp:  [97.6 F (36.4 C)-99.8 F (37.7 C)] 99.1 F (37.3 C) (09/30 0400) Pulse Rate:  [95-132] 122 (09/30 0722) Resp:  [13-34] 27 (09/30 0722) BP: (65-121)/(42-84) 91/69 mmHg (09/30 0722) SpO2:  [91 %-100 %] 95 % (09/30 0722) FiO2 (%):  [100 %] 100 % (09/30 0130) Weight:  [164 lb 8 oz (74.617 kg)] 164 lb 8 oz (74.617 kg) (09/30 0500) Last BM Date: 04/04/15  Weight change: Filed Weights   04/04/15 0500 04/05/15 0500 04/06/15 0500  Weight: 171 lb 4.8 oz (77.7 kg) 170 lb 13.7 oz (77.5 kg) 164 lb 8 oz (74.617 kg)    Intake/Output:   Intake/Output Summary (Last 24 hours) at 04/06/15 0823 Last data filed at 04/06/15 0700  Gross per 24 hour  Intake 1982.13 ml  Output    915 ml  Net 1067.13 ml   Physical Exam: General: Fatigued appearing. No resp difficulty. In bed.  HEENT: normal Neck: supple. JVP 8. Carotids 2+ bilat; no bruits. No lymphadenopathy or thryomegaly appreciated. Cor: PMI nondisplaced. Tachy Regular rate & rhythm. No rubs, or murmurs. + S3  Lungs: clear. On 4 liters Elliott.  Abdomen: soft, nontender, nondistended. No hepatosplenomegaly. No bruits or masses. Good bowel sounds. Extremities: no cyanosis, clubbing, rash, edema. RUE double lumen PICC  Neuro: alert & oriented x1 person. Cranial nerves grossly intact. moves all 4 extremities w/o difficulty. Affect pleasant  Telemetry: Sinus Tach 110s   Labs: Basic Metabolic Panel:  Recent Labs Lab 04/02/15 1032 04/03/15 0945 04/05/15 0601 04/05/15 2042 04/05/15 2251 04/06/15 0500  NA 137 138 136 138  --  134*  K 4.4 4.1 3.5 3.0*   --  3.2*  CL 98* 95* 96* 100*  --  95*  CO2 24 33* 30 31  --  29  GLUCOSE 23* 160* 124* 45*  --  167*  BUN 64* 72* 69* 66*  --  66*  CREATININE 2.31* 1.97* 1.87* 1.85*  --  2.11*  CALCIUM 9.2 8.7* 8.7* 8.4*  --  8.3*  MG  --  2.4  --   --  1.6*  --     Liver Function Tests:  Recent Labs Lab 04/02/15 1032 04/03/15 0945 04/05/15 0601 04/06/15 0500  AST 105* 73* 42* 36  ALT 81* 69* 48 40  ALKPHOS 857* 669* 627* 541*  BILITOT 3.4* 2.7* 2.7* 2.2*  PROT 8.8* 6.9 6.9 6.4*  ALBUMIN 3.2* 2.5* 2.5* 2.3*    Recent Labs Lab 04/05/15 1245  LIPASE 15*    Recent Labs Lab 04/05/15 0614 04/06/15 0515  AMMONIA 46* 49*    CBC:  Recent Labs Lab 04/02/15 1032 04/03/15 0329 04/03/15 1115 04/05/15 0010 04/05/15 2042  WBC 6.7 7.6  --  7.3 1.8*  NEUTROABS 4.8  --   --   --   --   HGB 11.4* 8.8* 8.9* 8.3* 8.3*  HCT 36.4* 28.8* 28.9* 26.3* 27.1*  MCV 67.2* 67.3*  --  67.6* 67.6*  PLT 329 273  --  280 270    Cardiac Enzymes:  Recent Labs Lab 04/05/15 2042 04/06/15 0207  TROPONINI 0.07* 0.14*    BNP: BNP (last 3 results)  Recent Labs  03/11/15 1702 03/21/15 1200 04/02/15 1032  BNP 1184.5* 824.5* 1932.0*    ProBNP (last 3 results) No results for input(s): PROBNP in the last 8760 hours.    Other results:  Imaging: Mr Brain Wo Contrast  04/05/2015   CLINICAL DATA:  Metabolic encephalopathy.  Altered mental status.  EXAM: MRI HEAD WITHOUT CONTRAST  TECHNIQUE: Multiplanar, multiecho pulse sequences of the brain and surrounding structures were obtained without intravenous contrast.  COMPARISON:  CT head without contrast 03/27/2015  FINDINGS: The study is mildly degraded by patient motion. Focal encephalomalacia is noted in the anterior right frontal lobe subjacent to the craniotomy. This is likely postoperative in nature. Mild generalized atrophy is present. There is no other significant white matter disease. No focal cortical signal abnormality is present otherwise.   No acute infarct, hemorrhage, or mass lesion is present.  Flow is present in the major intracranial arteries. Internal auditory canals and brainstem are normal. No significant extra-axial fluid collection is present.  Flow is present in the major intracranial arteries. Globes and orbits are intact. The paranasal sinuses and mastoid air cells are clear. Midline structures are unremarkable.  IMPRESSION: 1. Focal encephalomalacia in the anterior right frontal lobe is chronic, related to prior surgery. 2. Otherwise normal MRI appearance of the brain.   Electronically Signed   By: Marin Roberts M.D.   On: 04/05/2015 11:51   Dg Chest Port 1 View  04/05/2015   CLINICAL DATA:  Respiratory distress for 1 day  EXAM: PORTABLE CHEST - 1 VIEW  COMPARISON:  04/02/2015  FINDINGS: PICC line is now seen with the catheter tip in the mid superior vena cava. The cardiac shadow is stable. Bilateral infiltrates are noted in the left lung base as well as in the right mid and lower lung. No bony abnormality is seen. Postsurgical changes in the cervical spine are noted.  IMPRESSION: Increasing infiltrate particularly on the right and to a lesser degree in the left lung base.   Electronically Signed   By: Alcide Clever M.D.   On: 04/05/2015 20:27     Medications:     Scheduled Medications: . cefTAZidime (FORTAZ)  IV  2 g Intravenous Q12H  . clopidogrel  75 mg Oral Daily  . ferrous sulfate  325 mg Oral Q breakfast  . insulin aspart  0-9 Units Subcutaneous Q4H  . lipase/protease/amylase  36,000 Units Oral TID AC  . olopatadine  1 drop Both Eyes BID  . pantoprazole  40 mg Oral Daily  . potassium chloride  10 mEq Intravenous Q1 Hr x 4  . potassium chloride  40 mEq Oral Once  . torsemide  20 mg Oral BID  . vancomycin  750 mg Intravenous Q12H    Infusions: . dextrose 30 mL/hr at 04/06/15 0200  . milrinone 0.375 mcg/kg/min (04/06/15 0200)  . norepinephrine (LEVOPHED) Adult infusion 23 mcg/min (04/06/15 0641)  .  vasopressin (PITRESSIN) infusion - *FOR SHOCK* 0.03 Units/min (04/06/15 0657)    PRN Medications: albuterol, bisacodyl, dextrose, ondansetron **OR** ondansetron (ZOFRAN) IV   Assessment:   1. Hypoglycemia- uncontrolled DM 2. Chronic Systolic Heart Failure on milrinone 3. CKD Stage III 4. Smoker  5. PAD Stent placement R common iliac 02/2015 - on plavix  6. Delirium, acute on chronic 7. Acute Hypoxic Respiratory Failure  8. HCAP versus Aspiration  9. Hypokalemia  Plan/Discussion:   Events of yesterday noted  HCAP Versus Pneumonia- started  on vanc + fortaz. On vasopressin.   On norepi + milrinone 0.375 mcg. Weight continues to trend down. Volume status low. Hold  Diuretics today. Start check CVPs.    Length of Stay: 4   CLEGG,AMY NP-C  04/06/2015, 8:23 AM  Advanced Heart Failure Team Pager [phone] (M-F; 7a - 4p)  Please contact [facility] for night-coverage after hours (4p -7a ) and weekends on [email or web address]  Patient seen with NP, agree with the above note.    Hypotensive, started on norepinephrine and vasopressin in addition to his milrinone.  CXR with RLL PNA, concerning for development of HCAP.  Possible septic shock.   - Send co-ox, follow CVP.   - Creatinine up, diuretics held.   - Broad spectrum abx.   Remains confused.  MRI head without acute findings.   Marca Ancona 04/06/2015 12:34 PM

## 2015-04-06 NOTE — Significant Event (Signed)
Rapid Response Event Note  Overview: Time Called: 0015 Arrival Time: 0017 Event Type: Hypotension, Respiratory  Initial Focused Assessment:  Called by Makela, RN 2C to initiate levophed drip per request of Karen Kirby, NP on this hypotensive patient  awaiting ICU transfer.  On arrival pt opens eyes to voice and follows commands.  On BiPAP 100%  20/8 with O2 sats 99%.  RR 22  Bilateral Breath sounds coarse R>L, even and unlabored.   BP 68/49  (53)  ST 117 on monitor.    Levophed started at 0041 at 2mcg/min and titrated per protocol to 10 mcg/min with BP 87/59  (66) at 0100.  Pt was then transferred with cardiac monitor,and BIPAP accompanied by RT, RRT and Makela, RN2C to 2 M03 where bedside report was given to 2M staff.    Titration of Levophed was continued at 0112 with BP 81/53 (60) from 11-14mcg/min at 0118 with BP 85/56 (63) Hand off report of Levophed given to Jason, RN [facility] County Medical CenteIdChristus Mother Frances Hospital - SuLPhur Spri1478RinaStAneCrowne Point Endoscopy And Surgery CenteWarner HospitaEndo Group LLC Dba Garden City SurgicenteIdCommunity Hospital No1478RinaStAneKindred Hospital South BaMassachusetts Ave Surgery Cente<MEASUREMENTampa Va Medical CenteIdMidvalley Ambulatory Surgery Center 1478RinaStAnePhysicians Surgery Services LEagle Physicians And Associates P atFresno VNew England Eye Surgical Center InIdMemorial Hospital 1478RinaStAneSonora Behavioral Health Hospital (Hosp-PsyColeman Cataract And Eye Laser Surgery Center ISeashore Surgical InstitutIdUnitypoint Health Marshallt1478RinaStAneIowa Lutheran HospitaCrockett Medical Cente<MEASUREMilwaukee Surgical Suites LLIdNacogdoches Memorial Hospi1478RinaStAneGreenville Surgery Center LLEndosurg Outpatient Center LL<MEASUREGreat River Medical CenteIdHouston Methodist San Jacinto Hospital Alexander Cam1478RinaStAneMetropolitan HospitaMuleshoe Area Medical Cente<MEKindred Hospital AurorIdTrevose Specialty Care Surgical Center 1478RinaStAneSt. Lukes Sugar Land HospitaHuntsville Hospital, Th<MEAPiedmont Walton Hospital InIdKishwaukee Community Hospi1478RinaStAneSjrh - St Johns DivisioBaptist Health Medical Center - North Little Roc<MEASUREMENTBaylor St Lukes Medical Center - Mcnair CampuIdWinnebago Mental Hlth Instit1478RinaStAneHenderson County Community HospitaOnyx And Pearl Surgical Suites LL CtKindreSuncoast Endoscopy CenteIdMatagorda Regional Medical Cen1478RinaStAneChatuge Regional Hospita88Th Medical Group - Wright-Patterson [employer] Medical Cente<MEASUEncompass Health Rehabilitation Hospital Of Altamonte SpringIdCrawley Memorial Hospi1478RinaStAneWesley Camas HospitaHale County HospitaIu Health Jay HospitaIdSisters Of Charity Hospital - St Joseph Cam1478RinaStAneUniversity Hospital- Stoney BrooFranklin General Hospita<MEASUREMENCherry County HospitaIdHospital Pav Ya1478RinaStAneErlanger East HospitaWarm Springs MSpring Mountain Treatment CenteIdCumberland Valley Surgery Cen1478RinaStAneCharles River Endoscopy LLPioneer Memorial HosSelect Specialty Hospital - DallaIdFresno Ca Endoscopy Asc1478RinaStAneUchealth Broomfield HospiIdFoothill Surgery Center RinaStAne<MEASUPlantation General HospitaIdThe Centers 1478RinaStAneSharp Memorial HospitaCitrus Valley Medical Center - Ic Campu Peachtree Orthopaedic Surgery Center At PerimeteIdWray Community District Hospi1478RinaStAnePrisma Health Greenville Memorial HospitaPinnacle Cataract And LaserJohn & Mary Kirby HospitaIdEye Center Of North Florida Dba The Laser And Surgery Cen1478RinaStAneSnoqualmie Valley HospitaSchick Shadel Hosptia<MEASUREMills-Peninsula Medical CenteIdGrand Rapids Surgical Suites P1478RinaStAneSeattle Hand Surgery St. James Behavioral Health HospitaIdChildren'S Hospital Mc - College H1478RinaStAneMt Airy Ambulatory Endoscopy Surgery CenteUnion Health Services LL<MEASUREMENTSelect Specialty Hospital - Omaha (Central CampusIdBaptist Medical Center - Prince1478RinaStAneButte County PhAmbulatory Surgery Center Of Cool SpringsSurgery Center Of The Rockies LLIdLemuel Sattuck Hospi1478RinaStAneSaint Clares Hospital - DenvillCatskill Regional Medical Center Grover M. Herman Hospita 098Platinum Surgery Cen147Magnolia Surgery CenteGenesis Medical CenteByrd Regional HospitaIdSouthwest Healthcare Servi1478RinaStAneKettering Youth ServiceSurgery Center OLongleaf Surgery CenteIdCordova Community Medical Cen1478RinaStAneKindred Hospital Baldwin ParRussell Martha Jefferson HospitaIdPipeline Westlake Hospital LLC Dba Westlake Community Hospi1478RinaStAneCarolina Mountain Gastroenterology Endoscopy Center LLThe Eye Clinic Surgery CMarion Il Va Medical CenteIdCanyon Vista Medical Cen1478RinaStAneSouthern Nevada Adult Mental Health ServiceSentara Princess AnnLakeside Surgery LtIdAdventhealth Altamonte Spri1478RinaStAneAdventhealth TampRusk Rehab Center, A Jv Of Healthsouth & Univ<MEUw Health Rehabilitation HospitaIdHoag Orthopedic Instit1478RinaStAneWinnebago HospitaAssencion St Vincent'S MediVan Wert County HospitaIdEvergreen Health Mon1478RinaStAneGood Samaritan Medical CenteKindred HospitTroy Community HospitaIdTippah County Hospi1478RinaStAnePiedmont Geriatric HospitaCatholic Medical CeRegions HospitaIdYellowstone Surgery Center 1478RinaStAneFlorida Orthopaedic Institute Surgery Center LLAssurance Health Hudson LL CtChristus Jasper Memo098[place]entucky1-AlbenVal Verde Regional Medical CenteIdNational Surgical Centers Of America 1478RinaStAneAmbulatory Surgery Center Of Centralia LLSt. Luke'S Rehabilitatio ntSaint ClaVantage Point Of Northwest ArkansaIdDigestive Healthcare Of Ga 1478RinaStAneSt. Catherine Memorial HospitaEllenville RegiMetrowest Medical Center - Leonard Morse CampuIdSouthwest Georgia Regional Medical Cen1478RinaStAneJackson Parish HospitaMountainview Surgery Centerte RiedelrookingUREMENT>ltPremier Health A098[place]1-Alben SpiScFindlay Surger8.[place] f Consulting Physician Notified: Rahul Desai, PA-C at    Outcome: Transferred (Comment) (2M03)  Event End Time: 0120  Trivette, Paula K

## 2015-04-06 NOTE — Progress Notes (Signed)
CRITICAL VALUE ALERT  Critical value received: + blood cultures Gram negative rods aerobic bottle  Date of notification: 04/06/2015  Time of notification: 1430  Critical value read back:yes  Nurse who received alert: Barbaraann Faster RN   MD notified (1st page):  Dr. Lawanda Cousins  Time of first page:  1430  MD notified (2nd page):  Time of second page:  Responding MD:  Dr. Lawanda Cousins  Time MD responded: 1430

## 2015-04-06 NOTE — Progress Notes (Signed)
PCCM Rounding Note:  Patient reevaluated with wife at bedside this morning. Remains on vasopressor and inotropic support. Currently off of BiPAP and delirious. He is protecting his airway and saturating well on nasal cannula oxygen. Foley catheter had to be inserted due to urinary retention. Sending urine Legionella and streptococcal antigens. Continuing on broad-spectrum antibiotics for now. Awaiting culture results. Cardiology following. Ordering SCDs for DVT prophylaxis. Patient will remain nothing by mouth. Switching Protonix to IV. Hypokalemia replaced IV this morning. He is continuing on D10 infusion for hypoglycemia - checking CBG off arterial line to see if peripheral stick is accurate.  Donna Christen Jamison Neighbor, M.D. [facility] Pager:  [phone] After 3pm or if no response, call [phone]

## 2015-04-06 NOTE — Clinical Social Work Note (Signed)
Clinical Social Worker received handoff report of patient's transfer to [facility]M. [facility].  CSW will continue to follow patient and pt's family for continued support and to facilitate pt's discharge needs once medically stable.   Derenda Fennel, MSW, LCSWA [phone] 04/06/2015 11:45 AM

## 2015-04-06 NOTE — Consult Note (Signed)
PULMONARY / CRITICAL CARE MEDICINE   Name: Christian Wall MRN: [id number] DOB: [birth year]    ADMISSION DATE:  04/02/2015 CONSULTATION DATE:  04/06/15  REFERRING MD :  Joseph Art  CHIEF COMPLAINT:  Hypotension  INITIAL PRESENTATION:  [name] with severe sCHF (EF 15% on chronic milrinone) admitted 9/26 and found to be hypotensive and hypoxic.  On 9/29, he had worsening hypotension and hypoxia requiring BiPAP. CXR revealed worsening right sided infiltrate.  PCCM consulted for transfer to ICU and consideration of vasopressors.   STUDIES:  Echo 02/27/15 >>> severe global reduction in LV function, EF 15%; restrictive filling/ mod reduced RV function, mild MR, mod TR. Abd [place] 9/27 >>> lobulated appearance of the liver with small perihepatic ascites.  GB surgically absent. Brain MRI 9/29 >>> focal encephalomalacia in anterior right frontal lobe is chronic.  No acute process. CXR 9/29 >>> increasing infiltrate in the right lung base.  SIGNIFICANT EVENTS: 9/26 - admit. 9/28 - palliative care consulted > pt's wife not interested. 9/30 - PCCM consulted for hypotension, worsening R sided infiltrate - ? Aspiration.   HISTORY OF PRESENT ILLNESS:  Pt is encephalopathic; therefore, this HPI is obtained from chart review. Sir Mallis is a [name] who resides in a skilled nursing facility with PMH as outlined below including severe sCHF on chronic milrinone via PICC (EF 15% per echo from 02/27/15).  He presented to [facility] ED 9/26 after his PICC line had been dislodged.  In ED, he was hypotensive and had hypoxia therefore decision was made to admit him for further evaluation.  He was subsequently transferred to [facility] so that he could be evaluated by heart failure team. Since his admission, he has been somewhat encephalopathic (A&O x 2 appears to be his baseline after reviewing notes since admission).  He had brain MRI 9/29 which was negative for acute findings.  He was evaluated by heart failure team and was  deemed medically stable.  They recommended that he continue on his milrinone at 0.386mcg as well as torsemide  BID.  He is not a candidate for advanced therapies due to his CKD.  He was also evaluated by palliative care medicine for goals of care moving forward.  Mr. Sturgell deferred decisions to his wife and his wife was not interested in discussing Mr. Pardee's condition and informed them that she will call them if she has any questions.  On evening of 9/29, he became hypotensive and hypoxic.  He was placed on NRB with SpO2 only increasing to 85%.  He was ultimately started on BiPAP.  Due to his history of severe sCHF, primary team was hesitant to bolus him with IVF; therefore, PCCM was consulted for consideration of vasopressor agents.  Of note, CXR from this evening reveals worsening right sided infiltrate, possibly due to aspiration given his AMS?  PAST MEDICAL HISTORY :   has a past medical history of Diabetes mellitus; Atrophy of calf muscles; Depression; GERD (gastroesophageal reflux disease); Peripheral neuropathy; Hypercholesteremia; tracheostomy; and Chronic systolic CHF (congestive heart failure), NYHA class 3.  has past surgical history that includes Pancreas surgery (2001); Anterior cervical decomp/discectomy fusion (04/13/2012); Cardiac catheterization (N/A, 11/15/2014); and Cardiac catheterization (N/A, 02/26/2015). Prior to Admission medications   Medication Sig Start Date End Date Taking? Authorizing Provider  acetaminophen (TYLENOL) 500 MG tablet Take 2 tablets (1,000 mg total) by mouth every 6 (six) hours as needed for mild pain or headache. 11/21/14  Yes Rhonda G Barrett, PA-C  atorvastatin (LIPITOR) 40 MG tablet  Take 1 tablet (40 mg total) by mouth daily at 6 PM. 03/01/15  Yes Amy D Clegg, NP  bisacodyl (DULCOLAX) 5 MG EC tablet Take 5 mg by mouth daily as needed for mild constipation or moderate constipation.   Yes Historical Provider, MD  buPROPion (WELLBUTRIN SR) 150 MG 12 hr  tablet Take 150 mg by mouth 2 (two) times daily. 01/29/15  Yes Historical Provider, MD  clopidogrel (PLAVIX) 75 MG tablet Take 1 tablet (75 mg total) by mouth daily. 03/01/15  Yes Amy D Clegg, NP  [employer] VITAMIN B12 1000 MCG tablet Take 1,000 mcg by mouth daily. 10/15/14  Yes Historical Provider, MD  DULoxetine (CYMBALTA) 30 MG capsule Take 1 capsule (30 mg total) by mouth daily. 02/04/15  Yes Drema Dallas, MD  esomeprazole (NEXIUM) 40 MG capsule Take 40 mg by mouth daily before breakfast.   Yes Historical Provider, MD  ferrous sulfate 325 (65 FE) MG EC tablet Take 1 tablet (325 mg total) by mouth daily with breakfast. 03/08/15  Yes Amy D Clegg, NP  hydrALAZINE (APRESOLINE) 25 MG tablet Take 1 tablet (25 mg total) by mouth every 8 (eight) hours. 03/01/15  Yes Amy D Clegg, NP  insulin aspart (NOVOLOG) 100 UNIT/ML injection Inject 0-15 Units into the skin 3 (three) times daily with meals. CBG 70 - 120: 0 units CBG 121 - 150: 2 units CBG 151 - 200: 3 units CBG 201 - 250: 5 units CBG 251 - 300: 8 units CBG 301 - 350: 11 units CBG 351 - 400: 15 units 11/13/13  Yes Maryann Mikhail, DO  insulin glargine (LANTUS) 100 UNIT/ML injection Inject 0.2 mLs (20 Units total) into the skin 2 (two) times daily. 03/13/15  Yes Amy D Clegg, NP  isosorbide mononitrate (IMDUR) 30 MG 24 hr tablet Take 1 tablet (30 mg total) by mouth daily. 02/04/15  Yes Drema Dallas, MD  lipase/protease/amylase (CREON-10/PANCREASE) 12000 UNITS CPEP Take 36,000 Units by mouth 3 (three) times daily before meals.    Yes Historical Provider, MD  magnesium oxide (MAG-OX) 400 (241.3 MG) MG tablet Take 400 mg by mouth 2 (two) times daily. 01/26/15  Yes Historical Provider, MD  milrinone (PRIMACOR) 20 MG/100ML SOLN infusion Inject 31.8375 mcg/min into the vein continuous. Per [facility]. 12 months 03/29/15  Yes Clydia Llano, MD  Olopatadine HCl (PATADAY) 0.2 % SOLN Apply 1 drop to eye daily as needed. For allergies   Yes Historical Provider, MD  torsemide  (DEMADEX) 20 MG tablet Take 1 tablet (20 mg total) by mouth 2 (two) times daily. Start the 20 mg twice a day. Start 9/6 at 6 pm 03/13/15  Yes Amy D Clegg, NP  VENTOLIN HFA 108 (90 BASE) MCG/ACT inhaler Inhale 1-2 puffs into the lungs every 6 (six) hours as needed for wheezing or shortness of breath.  02/15/15  Yes Historical Provider, MD   No Known Allergies  FAMILY HISTORY:  Family History  Problem Relation Age of Onset  . Diabetes Mellitus II Sister   . CAD Brother   . Cancer Mother     ? type   . Cancer Father     ? type    SOCIAL HISTORY:  reports that he has been smoking Cigarettes.  He has a 30 pack-year smoking history. He has never used smokeless tobacco. He reports that he does not drink alcohol or use illicit drugs.  REVIEW OF SYSTEMS:  Unable to obtain as pt is encephalopathic.  SUBJECTIVE:   VITAL SIGNS: Temp:  [  97.6 F (36.4 C)-99.8 F (37.7 C)] 99.8 F (37.7 C) (09/30 0000) Pulse Rate:  [95-120] 117 (09/30 0023) Resp:  [13-31] 30 (09/30 0023) BP: (65-108)/(50-74) 73/50 mmHg (09/30 0023) SpO2:  [91 %-100 %] 99 % (09/30 0023) FiO2 (%):  [100 %] 100 % (09/29 1700) Weight:  [77.5 kg (170 lb 13.7 oz)] 77.5 kg (170 lb 13.7 oz) (09/29 0500) HEMODYNAMICS:   VENTILATOR SETTINGS: Vent Mode:  [-]  FiO2 (%):  [100 %] 100 % INTAKE / OUTPUT: Intake/Output      09/29 0701 - 09/30 0700   P.O. 240   I.V. (mL/kg) 533.4 (6.9)   Total Intake(mL/kg) 773.4 (10)   Urine (mL/kg/hr) 500 (0.3)   Stool 0 (0)   Total Output 500   Net +273.4       Urine Occurrence 1 x   Stool Occurrence 1 x     PHYSICAL EXAMINATION: General: [name], in NAD. Neuro: Awake, nods head in response to simple questions.  Follows basic commands.  Per RN, mental status appears to remain as it has since admission. HEENT: [place]/AT. PERRL, sclerae anicteric. BiPAP in place. Cardiovascular: RRR, 2/6 SEM.  Lungs: Respirations even and unlabored.  Coarse bilaterally R > L. Abdomen: BS x 4, soft, NT/ND.   Musculoskeletal: No gross deformities, no edema. RUE PICC in place. Skin: Intact, warm, no rashes.  LABS:  CBC  Recent Labs Lab 04/03/15 0329 04/03/15 1115 04/05/15 0010 04/05/15 2042  WBC 7.6  --  7.3 1.8*  HGB 8.8* 8.9* 8.3* 8.3*  HCT 28.8* 28.9* 26.3* 27.1*  PLT 273  --  280 270   Coag's  Recent Labs Lab 04/03/15 0329 04/05/15 1245  APTT 35  --   INR 1.49 1.42   BMET  Recent Labs Lab 04/03/15 0945 04/05/15 0601 04/05/15 2042  NA 138 136 138  K 4.1 3.5 3.0*  CL 95* 96* 100*  CO2 33* 30 31  BUN 72* 69* 66*  CREATININE 1.97* 1.87* 1.85*  GLUCOSE 160* 124* 45*   Electrolytes  Recent Labs Lab 04/03/15 0945 04/05/15 0601 04/05/15 2042 04/05/15 2251  CALCIUM 8.7* 8.7* 8.4*  --   MG 2.4  --   --  1.6*   Sepsis Markers  Recent Labs Lab 04/02/15 1332 04/02/15 1752 04/05/15 2044  LATICACIDVEN 4.63* 1.46 2.0   ABG  Recent Labs Lab 04/05/15 1953  PHART 7.461*  PCO2ART 40.5  PO2ART 50.5*   Liver Enzymes  Recent Labs Lab 04/02/15 1032 04/03/15 0945 04/05/15 0601  AST 105* 73* 42*  ALT 81* 69* 48  ALKPHOS 857* 669* 627*  BILITOT 3.4* 2.7* 2.7*  ALBUMIN 3.2* 2.5* 2.5*   Cardiac Enzymes  Recent Labs Lab 04/05/15 2042  TROPONINI 0.07*   Glucose  Recent Labs Lab 04/05/15 0735 04/05/15 1216 04/05/15 1637 04/05/15 2154 04/05/15 2157 04/05/15 2225  GLUCAP 104* 122* 102* 32* 41* 121*    Imaging Mr Brain Wo Contrast  04/05/2015   CLINICAL DATA:  Metabolic encephalopathy.  Altered mental status.  EXAM: MRI HEAD WITHOUT CONTRAST  TECHNIQUE: Multiplanar, multiecho pulse sequences of the brain and surrounding structures were obtained without intravenous contrast.  COMPARISON:  CT head without contrast 03/27/2015  FINDINGS: The study is mildly degraded by patient motion. Focal encephalomalacia is noted in the anterior right frontal lobe subjacent to the craniotomy. This is likely postoperative in nature. Mild generalized atrophy is  present. There is no other significant white matter disease. No focal cortical signal abnormality is present otherwise.  No acute infarct, hemorrhage, or mass lesion is present.  Flow is present in the major intracranial arteries. Internal auditory canals and brainstem are normal. No significant extra-axial fluid collection is present.  Flow is present in the major intracranial arteries. Globes and orbits are intact. The paranasal sinuses and mastoid air cells are clear. Midline structures are unremarkable.  IMPRESSION: 1. Focal encephalomalacia in the anterior right frontal lobe is chronic, related to prior surgery. 2. Otherwise normal MRI appearance of the brain.   Electronically Signed   By: Marin Roberts M.D.   On: 04/05/2015 11:51   Dg Chest Port 1 View  04/05/2015   CLINICAL DATA:  Respiratory distress for 1 day  EXAM: PORTABLE CHEST - 1 VIEW  COMPARISON:  04/02/2015  FINDINGS: PICC line is now seen with the catheter tip in the mid superior vena cava. The cardiac shadow is stable. Bilateral infiltrates are noted in the left lung base as well as in the right mid and lower lung. No bony abnormality is seen. Postsurgical changes in the cervical spine are noted.  IMPRESSION: Increasing infiltrate particularly on the right and to a lesser degree in the left lung base.   Electronically Signed   By: Alcide Clever M.D.   On: 04/05/2015 20:27    ASSESSMENT / PLAN:  PULMONARY A: Acute hypoxic respiratory failure in the setting of possible aspiration vs HCAP. ? Aspiration event vs HCAP. At risk intubation. Tobacco use disorder. P:   Continue BiPAP for now. Abx per ID section. Albuterol PRN. If worsens, will require intubation. Pulmonary hygiene. CXR in AM. Smoking cessation counseling.  CARDIOVASCULAR RUE PICC >>> A:  AoC severe systolic heart failure - on chronic milrinone gtt via PICC. Hypotension - likely infectious / sepsis due to new infiltrate (aspiration vs CAP). Troponin leak -  suspect due to demand ischemia; EKG reassuring. P:  Heart failure team following. Continue milrinone gtt, torsemide per heart failure team. Add norepinephrine as needed to maintain goal MAP > 65. Caution with aggressive IVF resuscitation given his advanced sCHF. Trend troponins. EKG in AM. Hold outpatient hydralazine, imdur.  RENAL A:   Hypokalemia - 4 runs given overnight by primary team. AoCKD. Pseudohypocalcemia - corrects to 9.6. Hypocalcemia. P:   Repeat BMP pending. Send ionized calcium. 2g Mag. CMP in AM.  GASTROINTESTINAL A:   Transaminitis - unclear etiology. Hyperammonemia. GERD. Nutrition. P:   F/u LFT's in AM. Lactulose. Continue outpatient PPI. NPO. SLP swallow eval.  HEMATOLOGIC A:   Leukopenia - ? lab error given normal WBC previously; consider early sepsis / septic shock. Anemia of chronic disease. VTE Prophylaxis. P:  Transfuse per usual ICU guidelines. SCD's / Plavix. CBC in AM.  INFECTIOUS A:   Aspiration vs HCAP. Sacral decub ulcers. P:   BCx2 9/26 >  BCx2 9/29 > UCx 9/29 > Sputum Cx 9/29 > Wound Cx 9/30 > Abx: Vanc, start date 9/29, day 1/x. Abx: Elita Quick, start date 9/29, day 1/x. PCT algorithm to limit abx exposure.  ENDOCRINE A:   DM. Hypoglycemia - started on D10. P:   SSI. D10 started. Lantus d/c'd.  NEUROLOGIC A:   Encephalopathy - per RN, his mental status appears to remain the same since admission.  MRI brain noted negative. P:   Limit sedating meds. Monitor clinically.   Family updated: Wife called by [facility] NP.  Interdisciplinary Family Meeting v Palliative Care Meeting:  Due by: 10/6.   Rutherford Guys, PA - [facility] Pager: [phone]  or [phone] 04/06/2015, 12:40 AM

## 2015-04-06 NOTE — Care Management Important Message (Signed)
Important Message  Patient Details  Name: Christian Wall MRN: [id number] Date of Birth: [birth year]   Medicare Important Message Given:  Yes-second notification given    Kyla Balzarine 04/06/2015, 12:25 PM

## 2015-04-06 NOTE — Progress Notes (Signed)
SLP Cancellation Note  Patient Details Name: Christian Wall MRN: [id number] DOB: 1960/09/17   Cancelled treatment:       Reason Eval/Treat Not Completed: Medical issues which prohibited therapy. Pt having procedure in room, also on BiPAP. Will check back later.   [name], [place] CCC-SLP [phone]  Claudine Mouton 04/06/2015, 8:17 AM

## 2015-04-06 NOTE — Progress Notes (Signed)
RT note: Transported patient on BIPAP from 2C14 to 2M03. Vital signs stable at this time. HR 121 rate 26 and sats 100%.

## 2015-04-07 ENCOUNTER — Inpatient Hospital Stay (HOSPITAL_COMMUNITY): Payer: Medicare Other

## 2015-04-07 DIAGNOSIS — K920 Hematemesis: Secondary | ICD-10-CM

## 2015-04-07 DIAGNOSIS — J189 Pneumonia, unspecified organism: Secondary | ICD-10-CM

## 2015-04-07 HISTORY — DX: Hematemesis: K92.0

## 2015-04-07 LAB — BASIC METABOLIC PANEL
Anion gap: 13 (ref 5–15)
BUN: 69 mg/dL — AB (ref 6–20)
CALCIUM: 8.4 mg/dL — AB (ref 8.9–10.3)
CO2: 24 mmol/L (ref 22–32)
CREATININE: 1.85 mg/dL — AB (ref 0.61–1.24)
Chloride: 97 mmol/L — ABNORMAL LOW (ref 101–111)
GFR calc Af Amer: 46 mL/min — ABNORMAL LOW (ref >60)
GFR, EST NON AFRICAN AMERICAN: 40 mL/min — AB (ref >60)
GLUCOSE: 182 mg/dL — AB (ref 65–99)
Potassium: 3.6 mmol/L (ref 3.5–5.1)
Sodium: 134 mmol/L — ABNORMAL LOW (ref 135–145)

## 2015-04-07 LAB — CALCIUM, IONIZED: Calcium, Ionized, Serum: 4.8 mg/dL (ref 4.5–5.6)

## 2015-04-07 LAB — CULTURE, BLOOD (ROUTINE X 2)
CULTURE: NO GROWTH
Culture: NO GROWTH

## 2015-04-07 LAB — CBC
HEMATOCRIT: 25 % — AB (ref 39.0–52.0)
Hemoglobin: 8 g/dL — ABNORMAL LOW (ref 13.0–17.0)
MCH: 20.8 pg — ABNORMAL LOW (ref 26.0–34.0)
MCHC: 32 g/dL (ref 30.0–36.0)
MCV: 64.9 fL — ABNORMAL LOW (ref 78.0–100.0)
PLATELETS: 248 10*3/uL (ref 150–400)
RBC: 3.85 MIL/uL — ABNORMAL LOW (ref 4.22–5.81)
RDW: 20.3 % — AB (ref 11.5–15.5)
WBC: 22.5 10*3/uL — AB (ref 4.0–10.5)

## 2015-04-07 LAB — CARBOXYHEMOGLOBIN
Carboxyhemoglobin: 1.8 % — ABNORMAL HIGH (ref 0.5–1.5)
Methemoglobin: 1 % (ref 0.0–1.5)
O2 Saturation: 36.7 %
Total hemoglobin: 7.6 g/dL — ABNORMAL LOW (ref 13.5–18.0)

## 2015-04-07 LAB — GLUCOSE, CAPILLARY
GLUCOSE-CAPILLARY: 218 mg/dL — AB (ref 65–99)
GLUCOSE-CAPILLARY: 211 mg/dL — AB (ref 65–99)
Glucose-Capillary: 168 mg/dL — ABNORMAL HIGH (ref 65–99)
Glucose-Capillary: 170 mg/dL — ABNORMAL HIGH (ref 65–99)
Glucose-Capillary: 172 mg/dL — ABNORMAL HIGH (ref 65–99)
Glucose-Capillary: 207 mg/dL — ABNORMAL HIGH (ref 65–99)

## 2015-04-07 LAB — URINE CULTURE

## 2015-04-07 LAB — MAGNESIUM: MAGNESIUM: 2.1 mg/dL (ref 1.7–2.4)

## 2015-04-07 LAB — PROCALCITONIN: PROCALCITONIN: 27.17 ng/mL

## 2015-04-07 LAB — PHOSPHORUS: PHOSPHORUS: 4.8 mg/dL — AB (ref 2.5–4.6)

## 2015-04-07 MED ORDER — PANTOPRAZOLE SODIUM 40 MG PO TBEC
40.0000 mg | DELAYED_RELEASE_TABLET | Freq: Every day | ORAL | Status: DC
  Administered 2015-04-07: 40 mg via ORAL
  Filled 2015-04-07 (×2): qty 1

## 2015-04-07 MED ORDER — CETYLPYRIDINIUM CHLORIDE 0.05 % MT LIQD
7.0000 mL | Freq: Two times a day (BID) | OROMUCOSAL | Status: DC
  Administered 2015-04-08 – 2015-04-11 (×7): 7 mL via OROMUCOSAL

## 2015-04-07 MED ORDER — HEPARIN SODIUM (PORCINE) 5000 UNIT/ML IJ SOLN
5000.0000 [IU] | Freq: Three times a day (TID) | INTRAMUSCULAR | Status: DC
  Administered 2015-04-07 – 2015-04-11 (×13): 5000 [IU] via SUBCUTANEOUS
  Filled 2015-04-07 (×16): qty 1

## 2015-04-07 NOTE — Progress Notes (Addendum)
Advanced Heart Failure Rounding Note   Subjective:    Admitted with hypoglycemia and replacement of PICC.   Moved to ICU due to RLL HCAP and sepsis. BCX 1/2 with GNR.   Feeling better off BiPAP. Denies dyspnea. Mildly confused but not too bad.    PCT 27 Co-ox down to 37%   Objective:   Weight Range:  Vital Signs:   Temp:  [97.8 F (36.6 C)-98.4 F (36.9 C)] 97.8 F (36.6 C) (10/01 0739) Pulse Rate:  [114-127] 114 (10/01 0600) Resp:  [22-29] 24 (10/01 0600) SpO2:  [90 %-98 %] 97 % (10/01 0600) Arterial Line BP: (81-127)/(41-70) 98/57 mmHg (10/01 0600) Weight:  [78.563 kg (173 lb 3.2 oz)] 78.563 kg (173 lb 3.2 oz) (10/01 0500) Last BM Date: 04/06/15  Weight change: Filed Weights   04/05/15 0500 04/06/15 0500 04/07/15 0500  Weight: 77.5 kg (170 lb 13.7 oz) 74.617 kg (164 lb 8 oz) 78.563 kg (173 lb 3.2 oz)    Intake/Output:   Intake/Output Summary (Last 24 hours) at 04/07/15 1039 Last data filed at 04/07/15 0600  Gross per 24 hour  Intake 1828.7 ml  Output    965 ml  Net  863.7 ml   Physical Exam: General:No resp difficulty. In bed.  HEENT: normal Neck: supple. JVP jaw. Carotids 2+ bilat; no bruits. No lymphadenopathy or thryomegaly appreciated. Cor: PMI nondisplaced. Tachy Regular rate & rhythm. No rubs, or murmurs. + S3  Lungs: clear. On 4 liters [place].  Abdomen: soft, nontender, nondistended. No hepatosplenomegaly. No bruits or masses. Good bowel sounds. Extremities: no cyanosis, clubbing, rash, edema. RUE double lumen PICC  Neuro: alert & oriented x1 person. Cranial nerves grossly intact. moves all 4 extremities w/o difficulty. Affect pleasant  Telemetry: Sinus Tach 110s   Labs: Basic Metabolic Panel:  Recent Labs Lab 04/03/15 0945 04/05/15 0601 04/05/15 2042 04/05/15 2251 04/06/15 0500 04/07/15 0446  NA 138 136 138  --  134* 134*  K 4.1 3.5 3.0*  --  3.2* 3.6  CL 95* 96* 100*  --  95* 97*  CO2 33* 30 31  --  29 24  GLUCOSE 160* 124* 45*  --   167* 182*  BUN 72* 69* 66*  --  66* 69*  CREATININE 1.97* 1.87* 1.85*  --  2.11* 1.85*  CALCIUM 8.7* 8.7* 8.4*  --  8.3* 8.4*  MG 2.4  --   --  1.6*  --  2.1  PHOS  --   --   --   --   --  4.8*    Liver Function Tests:  Recent Labs Lab 04/02/15 1032 04/03/15 0945 04/05/15 0601 04/06/15 0500  AST 105* 73* 42* 36  ALT 81* 69* 48 40  ALKPHOS 857* 669* 627* 541*  BILITOT 3.4* 2.7* 2.7* 2.2*  PROT 8.8* 6.9 6.9 6.4*  ALBUMIN 3.2* 2.5* 2.5* 2.3*    Recent Labs Lab 04/05/15 1245  LIPASE 15*    Recent Labs Lab 04/05/15 0614 04/06/15 0515  AMMONIA 46* 49*    CBC:  Recent Labs Lab 04/02/15 1032 04/03/15 0329 04/03/15 1115 04/05/15 0010 04/05/15 2042 04/07/15 0446  WBC 6.7 7.6  --  7.3 1.8* 22.5*  NEUTROABS 4.8  --   --   --   --   --   HGB 11.4* 8.8* 8.9* 8.3* 8.3* 8.0*  HCT 36.4* 28.8* 28.9* 26.3* 27.1* 25.0*  MCV 67.2* 67.3*  --  67.6* 67.6* 64.9*  PLT 329 273  --  280 270  248    Cardiac Enzymes:  Recent Labs Lab 04/05/15 2042 04/06/15 0207 04/06/15 0900  TROPONINI 0.07* 0.14* 0.18*    BNP: BNP (last 3 results)  Recent Labs  03/11/15 1702 03/21/15 1200 04/02/15 1032  BNP 1184.5* 824.5* 1932.0*    ProBNP (last 3 results) No results for input(s): PROBNP in the last 8760 hours.    Other results:  Imaging: Mr Brain Wo Contrast  04/05/2015   CLINICAL DATA:  Metabolic encephalopathy.  Altered mental status.  EXAM: MRI HEAD WITHOUT CONTRAST  TECHNIQUE: Multiplanar, multiecho pulse sequences of the brain and surrounding structures were obtained without intravenous contrast.  COMPARISON:  CT head without contrast 03/27/2015  FINDINGS: The study is mildly degraded by patient motion. Focal encephalomalacia is noted in the anterior right frontal lobe subjacent to the craniotomy. This is likely postoperative in nature. Mild generalized atrophy is present. There is no other significant white matter disease. No focal cortical signal abnormality is present  otherwise.  No acute infarct, hemorrhage, or mass lesion is present.  Flow is present in the major intracranial arteries. Internal auditory canals and brainstem are normal. No significant extra-axial fluid collection is present.  Flow is present in the major intracranial arteries. Globes and orbits are intact. The paranasal sinuses and mastoid air cells are clear. Midline structures are unremarkable.  IMPRESSION: 1. Focal encephalomalacia in the anterior right frontal lobe is chronic, related to prior surgery. 2. Otherwise normal MRI appearance of the brain.   Electronically Signed   By: Marin Roberts M.D.   On: 04/05/2015 11:51   Dg Chest Port 1 View  04/07/2015   CLINICAL DATA:  Shortness of breath  EXAM: PORTABLE CHEST 1 VIEW  COMPARISON:  Yesterday  FINDINGS: Stable cardiopericardial enlargement. Right upper extremity PICC, tip at the upper SVC.  Bilateral asymmetric airspace disease, greater on the right. Probable small bilateral pleural effusion. 04/02/2015 study suggests pulmonary edema, but asymmetry raises concern for pneumonia. No air leak.Overall, no definitive change from yesterday.  IMPRESSION: Stable asymmetric airspace disease, pneumonia versus asymmetric edema.   Electronically Signed   By: Marnee Spring M.D.   On: 04/07/2015 05:59   Dg Chest Port 1 View  04/05/2015   CLINICAL DATA:  Respiratory distress for 1 day  EXAM: PORTABLE CHEST - 1 VIEW  COMPARISON:  04/02/2015  FINDINGS: PICC line is now seen with the catheter tip in the mid superior vena cava. The cardiac shadow is stable. Bilateral infiltrates are noted in the left lung base as well as in the right mid and lower lung. No bony abnormality is seen. Postsurgical changes in the cervical spine are noted.  IMPRESSION: Increasing infiltrate particularly on the right and to a lesser degree in the left lung base.   Electronically Signed   By: Alcide Clever M.D.   On: 04/05/2015 20:27     Medications:     Scheduled  Medications: . cefTAZidime (FORTAZ)  IV  2 g Intravenous Q12H  . clopidogrel  75 mg Oral Daily  . ferrous sulfate  325 mg Oral Q breakfast  . insulin aspart  0-9 Units Subcutaneous Q4H  . olopatadine  1 drop Both Eyes BID  . pantoprazole  40 mg Oral Q1200  . vancomycin  1,000 mg Intravenous Q12H    Infusions: . dextrose 30 mL/hr at 04/06/15 1500  . milrinone 0.375 mcg/kg/min (04/06/15 2228)  . norepinephrine (LEVOPHED) Adult infusion 10 mcg/min (04/07/15 0222)  . vasopressin (PITRESSIN) infusion - *FOR SHOCK* 0.03 Units/min (04/07/15  0454)    PRN Medications: albuterol, bisacodyl, dextrose, ondansetron **OR** ondansetron (ZOFRAN) IV   Assessment:   1. Hypoglycemia- uncontrolled DM 2. Chronic Systolic Heart Failure on milrinone 3. CKD Stage III 4. Smoker  5. PAD Stent placement R common iliac 02/2015 - on plavix  6. Delirium, acute on chronic 7. Acute Hypoxic Respiratory Failure  8. HCAP versus Aspiration  9. Hypokalemia 10. Sepsis - Bcx 1/2 GNR  Plan/Discussion:    Now with HCAP and GNR sepsis. On vanc and ceftaz. Respiratory status much improved from yesterday. Remains on milrinone, vasopressin and norepi. Unfortunately co-ox back down. Would wean vasporessin as tolerated. Continue milrinone and norepi. Titrate norepi as needed. Weight is up a bit with resuscitation. Given that respiratory status is ok wll continue to hold diuretics for today.   Abx management per CCM. Appreciate their care  The patient is critically ill with multiple organ systems failure and requires high complexity decision making for assessment and support, frequent evaluation and titration of therapies, application of advanced monitoring technologies and extensive interpretation of multiple databases.   Critical Care Time devoted to patient care services described in this note is 35 Minutes.     Length of Stay: 5 Markell Sciascia MD 04/07/2015, 10:39 AM  Advanced Heart Failure Team Pager  [phone] (M-F; 7a - 4p)  Please contact [facility] for night-coverage after hours (4p -7a ) and weekends on [email or web address]

## 2015-04-07 NOTE — Evaluation (Signed)
Clinical/Bedside Swallow Evaluation Patient Details  Name: Christian Wall MRN: [id number] Date of Birth: 1960-08-24  Today's Date: 04/07/2015 Time: SLP Start Time (ACUTE ONLY): 1055 SLP Stop Time (ACUTE ONLY): 1115 SLP Time Calculation (min) (ACUTE ONLY): 20 min  Past Medical History:  Past Medical History  Diagnosis Date  . Diabetes mellitus   . Atrophy of calf muscles   . Depression   . GERD (gastroesophageal reflux disease)   . Peripheral neuropathy   . Hypercholesteremia   . Hx of tracheostomy   . Chronic systolic CHF (congestive heart failure), NYHA class 3    Past Surgical History:  Past Surgical History  Procedure Laterality Date  . Pancreas surgery  2001    hx pancreatitis  . Anterior cervical decomp/discectomy fusion  04/13/2012    Procedure: ANTERIOR CERVICAL DECOMPRESSION/DISCECTOMY FUSION 1 LEVEL;  Surgeon: Temple Pacini, MD;  Location: [facility] NEURO ORS;  Service: Neurosurgery;  Laterality: N/A;  Cervcial Five-Six Anterior Cervical Decompression and Fusion with Allograft and Plating  . Cardiac catheterization N/A 11/15/2014    Procedure: Right Heart Cath;  Surgeon: Dolores Patty, MD;  Location: [facility] INVASIVE CV LAB;  Service: Cardiovascular;  Laterality: N/A;  . Peripheral vascular catheterization N/A 02/26/2015    Procedure: Abdominal Aortogram;  Surgeon: Chuck Hint, MD;  Location: [facility] INVASIVE CV LAB;  Service: Cardiovascular;  Laterality: N/A;   HPI:  [name] with severe sCHF (EF 15% on chronic milrinone) admitted 9/26 and found to be hypotensive and hypoxic. On 9/29, he had worsening hypotension and hypoxia requiring BiPAP. CXR revealed worsening right sided infiltrate. PCCM consulted for transfer to ICU and consideration of vasopressors.   Assessment / Plan / Recommendation Clinical Impression  Patient presents with a moderate oral, and mild pharyngeal dysphagia, characterized by delayed mastication and oral manipulation, transit and clearance of solid  textures, swallow initiation delays with solids. Patient exhibited one mild throat clear during P.O. intake with hard solids, but otherwise, no other overt s/s of aspiration or penetration. Patient's cognitive status does impact his ability to safely consume solids and liquids, as he is impulsive and has reduced awareness to solid and liquid boluses.    Aspiration Risk  Moderate    Diet Recommendation Thin;Dysphagia 2 (Fine chop)   Medication Administration: Whole meds with liquid Compensations: Slow rate;Small sips/bites    Other  Recommendations Oral Care Recommendations: Oral care BID   Follow Up Recommendations       Frequency and Duration min 2x/week  2 weeks   Pertinent Vitals/Pain     SLP Swallow Goals     Swallow Study Prior Functional Status       General Date of Onset: 04/02/15 Other Pertinent Information: [name] with severe sCHF (EF 15% on chronic milrinone) admitted 9/26 and found to be hypotensive and hypoxic. On 9/29, he had worsening hypotension and hypoxia requiring BiPAP. CXR revealed worsening right sided infiltrate. PCCM consulted for transfer to ICU and consideration of vasopressors. Type of Study: Bedside swallow evaluation Previous Swallow Assessment: N/A Diet Prior to this Study: NPO Temperature Spikes Noted: No Respiratory Status: Supplemental O2 delivered via (comment) History of Recent Intubation: No Behavior/Cognition: Alert;Pleasant mood;Confused;Requires cueing Oral Cavity - Dentition: Adequate natural dentition/normal for age Self-Feeding Abilities: Needs assist (patient has bilateral glove restraints) Patient Positioning: Upright in bed Baseline Vocal Quality: Normal Volitional Cough: Strong Volitional Swallow: Able to elicit    Oral/Motor/Sensory Function Overall Oral Motor/Sensory Function: Appears within functional limits for tasks assessed   Ice  Chips Ice chips: Not tested   Thin Liquid Thin Liquid: Within functional  limits Presentation: Cup;Straw Other Comments: patient sipped through straw during initial trial, but later, he reflexively bit down on straw and could not coordinate    Nectar Thick Nectar Thick Liquid: Not tested   Honey Thick Honey Thick Liquid: Not tested   Puree Puree: Impaired Presentation: Spoon Oral Phase Impairments: Impaired anterior to posterior transit Oral Phase Functional Implications: Prolonged oral transit;Other (comment) (piecemeal swallowing )   Solid   GO    Solid: Impaired Oral Phase Impairments: Impaired anterior to posterior transit;Impaired mastication Oral Phase Functional Implications: Oral residue Pharyngeal Phase Impairments: Suspected delayed Estelle June, Tessie Fass 04/07/2015,1:51 PM     Angela Nevin, MA, CCC-SLP 04/07/2015 1:52 PM

## 2015-04-07 NOTE — Progress Notes (Signed)
PULMONARY / CRITICAL CARE MEDICINE   Name: Christian Wall MRN: [id number] DOB: 1960-11-25    ADMISSION DATE:  04/02/2015 CONSULTATION DATE:  04/06/15  REFERRING MD :  Joseph Art  CHIEF COMPLAINT:  Hypotension  INITIAL PRESENTATION:  [name] with severe sCHF (EF 15% on chronic milrinone) admitted 9/26 and found to be hypotensive and hypoxic.  On 9/29, he had worsening hypotension and hypoxia requiring BiPAP. CXR revealed worsening right sided infiltrate.  PCCM consulted for transfer to ICU and consideration of vasopressors.   STUDIES:  Echo 02/27/15 >>> severe global reduction in LV function, EF 15%; restrictive filling/ mod reduced RV function, mild MR, mod TR. Abd [place] 9/27 >>> lobulated appearance of the liver with small perihepatic ascites.  GB surgically absent. Brain MRI 9/29 >>> focal encephalomalacia in anterior right frontal lobe is chronic.  No acute process. CXR 9/29 >>> increasing infiltrate in the right lung base.  SIGNIFICANT EVENTS: 9/26 - admit. 9/28 - palliative care consulted > pt's wife not interested. 9/30 - PCCM consulted for hypotension, worsening R sided infiltrate - ? Aspiration.  SUBJECTIVE:  Awake and alert in bed  remains on pressors  Says he feels better with mild confusion  Weaned off BIPAP , O2 sats good, no increased wob   VITAL SIGNS: Temp:  [97.8 F (36.6 C)-98.4 F (36.9 C)] 97.8 F (36.6 C) (10/01 0739) Pulse Rate:  [114-127] 114 (10/01 0600) Resp:  [22-29] 24 (10/01 0600) BP: (98)/(73) 98/73 mmHg (09/30 1000) SpO2:  [90 %-98 %] 97 % (10/01 0600) Arterial Line BP: (81-127)/(41-70) 98/57 mmHg (10/01 0600) Weight:  [78.563 kg (173 lb 3.2 oz)] 78.563 kg (173 lb 3.2 oz) (10/01 0500) HEMODYNAMICS: CVP:  [20 mmHg-23 mmHg] 23 mmHg VENTILATOR SETTINGS:   INTAKE / OUTPUT: Intake/Output      09/30 0701 - 10/01 0700 10/01 0701 - 10/02 0700   P.O.     I.V. (mL/kg) 1604.6 (20.4)    IV Piggyback 550    Total Intake(mL/kg) 2154.6 (27.4)    Urine  (mL/kg/hr) 965 (0.5)    Stool     Total Output 965     Net +1189.6            PHYSICAL EXAMINATION: General: [name], appears ill  Neuro: Awake, alert and follows commands HEENT: [place]/AT. PERRL, sclerae anicteric. Cardiovascular: RRR, 2/6 SEM.  Lungs: Respirations even and unlabored.  Coarse bilaterally R > L. Abdomen: BS x 4, soft, NT/ND.  Musculoskeletal: No gross deformities, no edema. RUE PICC in place. Skin: Intact, warm, no rashes.    LABS:  CBC  Recent Labs Lab 04/05/15 0010 04/05/15 2042 04/07/15 0446  WBC 7.3 1.8* 22.5*  HGB 8.3* 8.3* 8.0*  HCT 26.3* 27.1* 25.0*  PLT 280 270 248   Coag's  Recent Labs Lab 04/03/15 0329 04/05/15 1245  APTT 35  --   INR 1.49 1.42   BMET  Recent Labs Lab 04/05/15 2042 04/06/15 0500 04/07/15 0446  NA 138 134* 134*  K 3.0* 3.2* 3.6  CL 100* 95* 97*  CO2 BUN 66* 66* 69*  CREATININE 1.85* 2.11* 1.85*  GLUCOSE 45* 167* 182*   Electrolytes  Recent Labs Lab 04/03/15 0945  04/05/15 2042 04/05/15 2251 04/06/15 0500 04/07/15 0446  CALCIUM 8.7*  < > 8.4*  --  8.3* 8.4*  MG 2.4  --   --  1.6*  --  2.1  PHOS  --   --   --   --   --  4.8*  < > = values in this interval not displayed. Sepsis Markers  Recent Labs Lab 04/02/15 1332 04/02/15 1752 04/05/15 2044 04/06/15 0155 04/07/15 0446  LATICACIDVEN 4.63* 1.46 2.0  --   --   PROCALCITON  --   --   --  21.32 27.17   ABG  Recent Labs Lab 04/05/15 1953  PHART 7.461*  PCO2ART 40.5  PO2ART 50.5*   Liver Enzymes  Recent Labs Lab 04/03/15 0945 04/05/15 0601 04/06/15 0500  AST 73* 42* 36  ALT 69* 48 40  ALKPHOS 669* 627* 541*  BILITOT 2.7* 2.7* 2.2*  ALBUMIN 2.5* 2.5* 2.3*   Cardiac Enzymes  Recent Labs Lab 04/05/15 2042 04/06/15 0207 04/06/15 0900  TROPONINI 0.07* 0.14* 0.18*   Glucose  Recent Labs Lab 04/06/15 1040 04/06/15 1514 04/06/15 1959 04/06/15 2344 04/07/15 0339 04/07/15 0741  GLUCAP 114* 120* 133* 170* 172*  168*    Imaging Dg Chest Port 1 View  04/07/2015   CLINICAL DATA:  Shortness of breath  EXAM: PORTABLE CHEST 1 VIEW  COMPARISON:  Yesterday  FINDINGS: Stable cardiopericardial enlargement. Right upper extremity PICC, tip at the upper SVC.  Bilateral asymmetric airspace disease, greater on the right. Probable small bilateral pleural effusion. 04/02/2015 study suggests pulmonary edema, but asymmetry raises concern for pneumonia. No air leak.Overall, no definitive change from yesterday.  IMPRESSION: Stable asymmetric airspace disease, pneumonia versus asymmetric edema.   Electronically Signed   By: Marnee Spring M.D.   On: 04/07/2015 05:59    ASSESSMENT / PLAN:  PULMONARY A: Acute hypoxic respiratory failure in the setting of possible aspiration vs HCAP. ? Aspiration event vs HCAP. At risk intubation. Tobacco use disorder. P:   BiPAP as beeded,  Abx per ID section. Albuterol PRN. If worsens, will require intubation. Pulmonary hygiene. CXR in AM.    CARDIOVASCULAR RUE PICC >>> A:  AoC severe systolic heart failure - on chronic milrinone gtt via PICC. Hypotension - likely infectious / sepsis due to new infiltrate (aspiration vs CAP). Troponin leak - suspect due to demand ischemia; EKG reassuring. P:  Heart failure team following. Continue milrinone gtt, torsemide per heart failure team. Continue pressors, wean to maintain goal MAP > 65. Caution with aggressive IVF resuscitation given his advanced sCHF. Hold outpatient hydralazine, imdur.  RENAL A:   Hypokalemia - improved  AoCKD. Pseudohypocalcemia - corrects to 9.6. Hypocalcemia. P:   Daily bmp    GASTROINTESTINAL A:   Transaminitis - unclear etiology. Hyperammonemia. GERD. Nutrition. P:   F/u LFT's in AM. Lactulose. Continue outpatient PPI.-change to oral  SLP swallow eval pending , if ok restart diet   HEMATOLOGIC A:   Leukopenia -  lab error  , repeat was elevated  Anemia of chronic disease. VTE  Prophylaxis. P:  Transfuse per usual ICU guidelines. SCD's / Plavix. CBC in AM.  INFECTIOUS A:   Aspiration vs HCAP. Sacral decub ulcers. 10/1 PCT tr up 21>27 P:   BCx2 9/26 >  BCx2 9/29 > UCx 9/29 > Sputum Cx 9/29 > Wound Cx 9/30 > Abx: Vanc, start date 9/29, day 2/x. Abx: Elita Quick, start date 9/29, day 2/x. PCT algorithm to limit abx exposure.  ENDOCRINE A:   DM. Hypoglycemia - started on D10 9/30 P:   SSI. Cont D10 , stop once diet started  Lantus d/c'd.9/30  NEUROLOGIC A:   Encephalopathy -  MRI brain noted negative. P:   Limit sedating meds. Monitor clinically.   Family updated:  None present   Interdisciplinary  Family Meeting v Palliative Care Meeting:  Due by: 10/6.  Tammy Parrett NP-C  [facility]  [phone]    04/07/2015, 9:52 AM  Reviewed above.  Was more confused overnight.  Orientation better this AM.  Denies chest/abd pain.    Alert, follows simple commands.  Scattered rhonchi.  Tachycardic.  Abd soft.  GNR in one blood cx.  Remains on levophed, milrinone.  Labs, CXR from 10/01 reviewed.  Will continue current Abx pending final cx results.  Wean pressors to keep MAP > 65.  Milrinone per cardiology.  CC time by me independent of APP tim 32 minutes.  Coralyn Helling, MD [facility] Pulmonary/Critical Care 04/07/2015, 12:41 PM Pager:  [phone] After 3pm call: [phone]

## 2015-04-07 NOTE — Progress Notes (Signed)
Dr. Craige Cotta made aware of 9 run Vtach, blood pressure maintaining patient is on levo and vasopressin. Will continue to monitor closely, no additional orders in place.  Corliss Skains RN

## 2015-04-08 ENCOUNTER — Inpatient Hospital Stay (HOSPITAL_COMMUNITY): Payer: Medicare Other

## 2015-04-08 DIAGNOSIS — R7881 Bacteremia: Secondary | ICD-10-CM

## 2015-04-08 DIAGNOSIS — R570 Cardiogenic shock: Secondary | ICD-10-CM

## 2015-04-08 LAB — CBC
HCT: 24 % — ABNORMAL LOW (ref 39.0–52.0)
HEMOGLOBIN: 7.8 g/dL — AB (ref 13.0–17.0)
MCH: 20.9 pg — AB (ref 26.0–34.0)
MCHC: 32.5 g/dL (ref 30.0–36.0)
MCV: 64.2 fL — ABNORMAL LOW (ref 78.0–100.0)
Platelets: 217 10*3/uL (ref 150–400)
RBC: 3.74 MIL/uL — ABNORMAL LOW (ref 4.22–5.81)
RDW: 19.8 % — AB (ref 11.5–15.5)
WBC: 20.9 10*3/uL — ABNORMAL HIGH (ref 4.0–10.5)

## 2015-04-08 LAB — COMPREHENSIVE METABOLIC PANEL
ALBUMIN: 2.1 g/dL — AB (ref 3.5–5.0)
ALK PHOS: 962 U/L — AB (ref 38–126)
ALT: 127 U/L — AB (ref 17–63)
ANION GAP: 12 (ref 5–15)
AST: 128 U/L — AB (ref 15–41)
BILIRUBIN TOTAL: 7 mg/dL — AB (ref 0.3–1.2)
BUN: 74 mg/dL — AB (ref 6–20)
CALCIUM: 8.1 mg/dL — AB (ref 8.9–10.3)
CO2: 25 mmol/L (ref 22–32)
Chloride: 92 mmol/L — ABNORMAL LOW (ref 101–111)
Creatinine, Ser: 1.71 mg/dL — ABNORMAL HIGH (ref 0.61–1.24)
GFR calc Af Amer: 51 mL/min — ABNORMAL LOW (ref >60)
GFR calc non Af Amer: 44 mL/min — ABNORMAL LOW (ref >60)
GLUCOSE: 272 mg/dL — AB (ref 65–99)
Potassium: 3.8 mmol/L (ref 3.5–5.1)
SODIUM: 129 mmol/L — AB (ref 135–145)
TOTAL PROTEIN: 6.3 g/dL — AB (ref 6.5–8.1)

## 2015-04-08 LAB — GLUCOSE, CAPILLARY
GLUCOSE-CAPILLARY: 120 mg/dL — AB (ref 65–99)
GLUCOSE-CAPILLARY: 211 mg/dL — AB (ref 65–99)
GLUCOSE-CAPILLARY: 231 mg/dL — AB (ref 65–99)
Glucose-Capillary: 245 mg/dL — ABNORMAL HIGH (ref 65–99)
Glucose-Capillary: 195 mg/dL — ABNORMAL HIGH (ref 65–99)
Glucose-Capillary: 139 mg/dL — ABNORMAL HIGH (ref 65–99)

## 2015-04-08 LAB — CARBOXYHEMOGLOBIN
CARBOXYHEMOGLOBIN: 1.7 % — AB (ref 0.5–1.5)
METHEMOGLOBIN: 0.8 % (ref 0.0–1.5)
O2 SAT: 47.1 %
Total hemoglobin: 7.9 g/dL — ABNORMAL LOW (ref 13.5–18.0)

## 2015-04-08 LAB — CULTURE, BLOOD (ROUTINE X 2)

## 2015-04-08 LAB — PROCALCITONIN: Procalcitonin: 18.74 ng/mL

## 2015-04-08 LAB — AMMONIA: AMMONIA: 47 umol/L — AB (ref 9–35)

## 2015-04-08 MED ORDER — FUROSEMIDE 10 MG/ML IJ SOLN
80.0000 mg | Freq: Two times a day (BID) | INTRAMUSCULAR | Status: DC
  Administered 2015-04-08 – 2015-04-11 (×6): 80 mg via INTRAVENOUS
  Filled 2015-04-08 (×9): qty 8

## 2015-04-08 MED ORDER — DEXTROSE 5 % IV SOLN
2.0000 g | INTRAVENOUS | Status: DC
  Administered 2015-04-09 – 2015-04-10 (×3): 2 g via INTRAVENOUS
  Filled 2015-04-08 (×4): qty 2

## 2015-04-08 MED ORDER — LACTULOSE ENEMA
300.0000 mL | Freq: Once | ORAL | Status: AC
  Administered 2015-04-08: 300 mL via RECTAL
  Filled 2015-04-08: qty 300

## 2015-04-08 NOTE — Progress Notes (Signed)
PULMONARY / CRITICAL CARE MEDICINE   Name: Christian Wall MRN: [id number] DOB: 1961/03/10    ADMISSION DATE:  04/02/2015 CONSULTATION DATE:  04/06/15  REFERRING MD :  Sherral Hammers  CHIEF COMPLAINT:  Hypotension  INITIAL PRESENTATION:  [name] with severe sCHF (EF 15% on chronic milrinone) admitted 9/26 and found to be hypotensive and hypoxic.  On 9/29, he had worsening hypotension and hypoxia requiring BiPAP. CXR revealed worsening right sided infiltrate.  PCCM consulted for transfer to ICU and consideration of vasopressors.   STUDIES:  Echo 02/27/15 >>> severe global reduction in LV function, EF 15%; restrictive filling/ mod reduced RV function, mild MR, mod TR. Abd [place] 9/27 >>> lobulated appearance of the liver with small perihepatic ascites.  GB surgically absent. Brain MRI 9/29 >>> focal encephalomalacia in anterior right frontal lobe is chronic.  No acute process. CXR 9/29 >>> increasing infiltrate in the right lung base.  SIGNIFICANT EVENTS: 9/26 - admit. 9/28 - palliative care consulted > pt's wife not interested. 9/30 - PCCM consulted for hypotension, worsening R sided infiltrate - ? Aspiration.  SUBJECTIVE:  More confused today  remains on pressors   VITAL SIGNS: Temp:  [97.4 F (36.3 C)-98.4 F (36.9 C)] 98.4 F (36.9 C) (10/02 0801) Pulse Rate:  [104-115] 110 (10/02 0800) Resp:  [16-38] 28 (10/02 0800) BP: (48-101)/(28-73) 86/67 mmHg (10/02 0800) SpO2:  [92 %-100 %] 95 % (10/02 0800) Arterial Line BP: (65-109)/(55-87) 71/63 mmHg (10/01 2015) Weight:  [79.107 kg (174 lb 6.4 oz)] 79.107 kg (174 lb 6.4 oz) (10/02 0437) HEMODYNAMICS: CVP:  [17 mmHg-29 mmHg] 18 mmHg VENTILATOR SETTINGS:   INTAKE / OUTPUT: Intake/Output      10/01 0701 - 10/02 0700 10/02 0701 - 10/03 0700   I.V. (mL/kg) 1420.5 (18) 64.1 (0.8)   Other 220 10   IV Piggyback 500    Total Intake(mL/kg) 2140.5 (27.1) 74.1 (0.9)   Urine (mL/kg/hr) 573 (0.3) 50 (0.4)   Total Output 573 50   Net +1567.5  +24.1          PHYSICAL EXAMINATION: General: [name], appears ill  Neuro: more confused , intermittent f/c  HEENT: [place]/AT. PERRL, sclerae anicteric. Cardiovascular: RRR, 2/6 SEM.  Lungs: Respirations even and unlabored.  Coarse bilaterally R > L. Abdomen: BS x 4, soft, NT/ND.  Musculoskeletal: No gross deformities, no edema. RUE PICC in place. Skin: Intact, warm, no rashes.    LABS:  CBC  Recent Labs Lab 04/05/15 2042 04/07/15 0446 04/08/15 0450  WBC 1.8* 22.5* 20.9*  HGB 8.3* 8.0* 7.8*  HCT 27.1* 25.0* 24.0*  PLT 270 248 217   Coag's  Recent Labs Lab 04/03/15 0329 04/05/15 1245  APTT 35  --   INR 1.49 1.42   BMET  Recent Labs Lab 04/06/15 0500 04/07/15 0446 04/08/15 0450  NA 134* 134* 129*  K 3.2* 3.6 3.8  CL 95* 97* 92*  CO2 29 24 25   BUN 66* 69* 74*  CREATININE 2.11* 1.85* 1.71*  GLUCOSE 167* 182* 272*   Electrolytes  Recent Labs Lab 04/03/15 0945  04/05/15 2251 04/06/15 0500 04/07/15 0446 04/08/15 0450  CALCIUM 8.7*  < >  --  8.3* 8.4* 8.1*  MG 2.4  --  1.6*  --  2.1  --   PHOS  --   --   --   --  4.8*  --   < > = values in this interval not displayed. Sepsis Markers  Recent Labs Lab 04/02/15 1332 04/02/15 1752  04/05/15 2044 04/06/15 0155 04/07/15 0446 04/08/15 0450  LATICACIDVEN 4.63* 1.46 2.0  --   --   --   PROCALCITON  --   --   --  21.32 27.17 18.74   ABG  Recent Labs Lab 04/05/15 1953  PHART 7.461*  PCO2ART 40.5  PO2ART 50.5*   Liver Enzymes  Recent Labs Lab 04/05/15 0601 04/06/15 0500 04/08/15 0450  AST 42* 36 128*  ALT 48 40 127*  ALKPHOS 627* 541* 962*  BILITOT 2.7* 2.2* 7.0*  ALBUMIN 2.5* 2.3* 2.1*   Cardiac Enzymes  Recent Labs Lab 04/05/15 2042 04/06/15 0207 04/06/15 0900  TROPONINI 0.07* 0.14* 0.18*   Glucose  Recent Labs Lab 04/07/15 1158 04/07/15 1609 04/07/15 2005 04/08/15 0015 04/08/15 0406 04/08/15 0809  GLUCAP 218* 211* 207* 231* 245* 211*    Imaging Dg Chest Port 1  View  04/08/2015   CLINICAL DATA:  Pneumonia  EXAM: PORTABLE CHEST 1 VIEW  COMPARISON:  04/07/2015  FINDINGS: Right lower lobe infiltrate is unchanged.  Small right effusion.  Cardiac enlargement without heart failure. Mild left lower lobe atelectasis/infiltrate is unchanged. PICC tip in the SVC unchanged.  IMPRESSION: Right lower lobe pneumonia unchanged.   Electronically Signed   By: Franchot Gallo M.D.   On: 04/08/2015 07:09    ASSESSMENT / PLAN:  PULMONARY A: Acute hypoxic respiratory failure in the setting of possible aspiration vs HCAP-RLL . ? Aspiration event vs HCAP. At risk intubation. Tobacco use disorder. P:   BiPAP as needed,  Abx per ID section. Albuterol PRN Pulmonary hygiene. CXR in AM    CARDIOVASCULAR RUE PICC >>> A:  AoC severe systolic heart failure - on chronic milrinone gtt via PICC. Hypotension - likely infectious / sepsis due to new infiltrate (aspiration vs CAP). Troponin leak - suspect due to demand ischemia; EKG reassuring. P:  Heart failure team following. Continue milrinone gtt, torsemide per heart failure team. Continue pressors, wean to maintain goal MAP > 65. Caution with aggressive IVF resuscitation given his advanced sCHF. Hold outpatient hydralazine, imdur.  RENAL A:   Hypokalemia -resolved  AoCKD.>improving   Hypocalcemia.-mild  P:   Daily bmp    GASTROINTESTINAL A:   Transaminitis - unclear etiology. Hyperammonemia. Alk Phos tr up 541>962  GERD. Nutrition.>Swallow eval 10/1 >mild to mod  dysphagia w/ rec D2 diet  Previously on Creon /pancrease PTA  P:   F/u LFT's in AM. Check Ammonia level , if elevated give lactulose  Continue PPI  D2 Diet   HEMATOLOGIC A:   Leukopenia -  lab error    Anemia of chronic disease. VTE Prophylaxis. P:  Transfuse per usual ICU guidelines. SCD's / Plavix. CBC in AM.  INFECTIOUS A:   Aspiration vs HCAP. Sacral decub ulcers. 10/2 PCT tr dow 27>18  P:   BCx2 9/26 > NEG  BCx2 9/29  >GNR > UCx 9/29 >multi species  Sputum Cx 9/29 > Wound Cx 9/30 > Abx: Vanc, start date 9/29, day 3/x. Abx: Tressie Ellis, start date 9/29, day 3/x. PCT algorithm to limit abx exposure.  ENDOCRINE A:   DM. Hypoglycemia - started on D10 9/30>resolved  P:   SSI. D/C  D10  Lantus d/c'd.9/30  NEUROLOGIC A:   Encephalopathy -  MRI brain noted negative. ? Related to elevated ammonia levels  P:   Limit sedating meds. Monitor clinically.      Family updated:  None present   Interdisciplinary Family Meeting v Palliative Care Meeting:  Due by: 10/6.  Tammy  Parrett NP-C  [facility]  [phone]    04/08/2015, 8:37 AM  Reviewed above.  More sleepy this am.  Still has cough and congestion.  Follows commands, b/l rhonchi, abd soft, HR regular tachy.  Will narrow Abx to rocephin with E coli in blood cx.  Wean off pressors to keep MAP > 65.  Will restart lactulose.  Updated pts wife at bedside.  CC time by me independent of APP time is 34 minutes.  Chesley Mires, MD [facility] Pulmonary/Critical Care 04/08/2015, 12:27 PM Pager:  [phone] After 3pm call: [phone]

## 2015-04-08 NOTE — Progress Notes (Addendum)
Advanced Heart Failure Rounding Note   Subjective:    Admitted with hypoglycemia and replacement of PICC.   Moved to ICU due to RLL HCAP and sepsis. BCX 1/2 with GNR.   Worse today. C/o ab pain. Remains on levophed 9, vasopressin and milrinone. Co-ox 37 -> 47%. CVP 25    Objective:   Weight Range:  Vital Signs:   Temp:  [97.8 F (36.6 C)-98.4 F (36.9 C)] 98.2 F (36.8 C) (10/02 1151) Pulse Rate:  [104-115] 110 (10/02 1300) Resp:  [16-38] 22 (10/02 1300) BP: (48-101)/(28-73) 100/73 mmHg (10/02 1300) SpO2:  [92 %-100 %] 99 % (10/02 1300) Arterial Line BP: (65-101)/(55-74) 71/63 mmHg (10/01 2015) Weight:  [79.107 kg (174 lb 6.4 oz)] 79.107 kg (174 lb 6.4 oz) (10/02 0437) Last BM Date: 04/07/15  Weight change: Filed Weights   04/06/15 0500 04/07/15 0500 04/08/15 0437  Weight: 74.617 kg (164 lb 8 oz) 78.563 kg (173 lb 3.2 oz) 79.107 kg (174 lb 6.4 oz)    Intake/Output:   Intake/Output Summary (Last 24 hours) at 04/08/15 1427 Last data filed at 04/08/15 1300  Gross per 24 hour  Intake 1926.38 ml  Output    540 ml  Net 1386.38 ml   Physical Exam: General:Lying in bed. moaning at times HEENT: normal Neck: supple. JVP jaw. Carotids 2+ bilat; no bruits. No lymphadenopathy or thryomegaly appreciated. Cor: PMI nondisplaced. Tachy Regular rate & rhythm. No rubs, or murmurs. + S3  Lungs: clear.  Abdomen: soft, nontender, + stended. No hepatosplenomegaly. No bruits or masses. Good bowel sounds. Extremities: no cyanosis, clubbing, rash, 2+ ma. RUE double lumen PICC  Neuro: alert & oriented x1 person. Cranial nerves grossly intact. moves all 4 extremities w/o difficulty. Affect pleasant  Telemetry: Sinus Tach    Labs: Basic Metabolic Panel:  Recent Labs Lab 04/03/15 0945 04/05/15 0601 04/05/15 2042 04/05/15 2251 04/06/15 0500 04/07/15 0446 04/08/15 0450  NA 138 136 138  --  134* 134* 129*  K 4.1 3.5 3.0*  --  3.2* 3.6 3.8  CL 95* 96* 100*  --  95* 97* 92*    CO2 33* 30 31  --  29 24 25   GLUCOSE 160* 124* 45*  --  167* 182* 272*  BUN 72* 69* 66*  --  66* 69* 74*  CREATININE 1.97* 1.87* 1.85*  --  2.11* 1.85* 1.71*  CALCIUM 8.7* 8.7* 8.4*  --  8.3* 8.4* 8.1*  MG 2.4  --   --  1.6*  --  2.1  --   PHOS  --   --   --   --   --  4.8*  --     Liver Function Tests:  Recent Labs Lab 04/02/15 1032 04/03/15 0945 04/05/15 0601 04/06/15 0500 04/08/15 0450  AST 105* 73* 42* 36 128*  ALT 81* 69* 48 40 127*  ALKPHOS 857* 669* 627* 541* 962*  BILITOT 3.4* 2.7* 2.7* 2.2* 7.0*  PROT 8.8* 6.9 6.9 6.4* 6.3*  ALBUMIN 3.2* 2.5* 2.5* 2.3* 2.1*    Recent Labs Lab 04/05/15 1245  LIPASE 15*    Recent Labs Lab 04/05/15 0614 04/06/15 0515 04/08/15 0925  AMMONIA 46* 49* 47*    CBC:  Recent Labs Lab 04/02/15 1032 04/03/15 0329 04/03/15 1115 04/05/15 0010 04/05/15 2042 04/07/15 0446 04/08/15 0450  WBC 6.7 7.6  --  7.3 1.8* 22.5* 20.9*  NEUTROABS 4.8  --   --   --   --   --   --  HGB 11.4* 8.8* 8.9* 8.3* 8.3* 8.0* 7.8*  HCT 36.4* 28.8* 28.9* 26.3* 27.1* 25.0* 24.0*  MCV 67.2* 67.3*  --  67.6* 67.6* 64.9* 64.2*  PLT 329 273  --  280 270 248 217    Cardiac Enzymes:  Recent Labs Lab 04/05/15 2042 04/06/15 0207 04/06/15 0900  TROPONINI 0.07* 0.14* 0.18*    BNP: BNP (last 3 results)  Recent Labs  03/11/15 1702 03/21/15 1200 04/02/15 1032  BNP 1184.5* 824.5* 1932.0*    ProBNP (last 3 results) No results for input(s): PROBNP in the last 8760 hours.    Other results:  Imaging: Dg Chest Port 1 View  04/08/2015   CLINICAL DATA:  Pneumonia  EXAM: PORTABLE CHEST 1 VIEW  COMPARISON:  04/07/2015  FINDINGS: Right lower lobe infiltrate is unchanged.  Small right effusion.  Cardiac enlargement without heart failure. Mild left lower lobe atelectasis/infiltrate is unchanged. PICC tip in the SVC unchanged.  IMPRESSION: Right lower lobe pneumonia unchanged.   Electronically Signed   By: Marlan Palau M.D.   On: 04/08/2015 07:09    Dg Chest Port 1 View  04/07/2015   CLINICAL DATA:  Shortness of breath  EXAM: PORTABLE CHEST 1 VIEW  COMPARISON:  Yesterday  FINDINGS: Stable cardiopericardial enlargement. Right upper extremity PICC, tip at the upper SVC.  Bilateral asymmetric airspace disease, greater on the right. Probable small bilateral pleural effusion. 04/02/2015 study suggests pulmonary edema, but asymmetry raises concern for pneumonia. No air leak.Overall, no definitive change from yesterday.  IMPRESSION: Stable asymmetric airspace disease, pneumonia versus asymmetric edema.   Electronically Signed   By: Marnee Spring M.D.   On: 04/07/2015 05:59     Medications:     Scheduled Medications: . antiseptic oral rinse  7 mL Mouth Rinse BID  . cefTRIAXone (ROCEPHIN)  IV  2 g Intravenous Q24H  . clopidogrel  75 mg Oral Daily  . ferrous sulfate  325 mg Oral Q breakfast  . heparin subcutaneous  5,000 Units Subcutaneous 3 times per day  . insulin aspart  0-9 Units Subcutaneous Q4H  . olopatadine  1 drop Both Eyes BID  . pantoprazole  40 mg Oral Q1200    Infusions: . milrinone 0.375 mcg/kg/min (04/08/15 0815)  . norepinephrine (LEVOPHED) Adult infusion 10 mcg/min (04/08/15 0815)  . vasopressin (PITRESSIN) infusion - *FOR SHOCK* 0.03 Units/min (04/08/15 0651)    PRN Medications: albuterol, bisacodyl, dextrose, [DISCONTINUED] ondansetron **OR** ondansetron (ZOFRAN) IV   Assessment:   1. Hypoglycemia- uncontrolled DM 2. Chronic Systolic Heart Failure on milrinone 3. CKD Stage III 4. Smoker  5. PAD Stent placement R common iliac 02/2015 - on plavix  6. Delirium, acute on chronic 7. Acute Hypoxic Respiratory Failure  8. HCAP versus Aspiration  9. Hypokalemia 10. Sepsis - Bcx 1/2 GNR  Plan/Discussion:    Now with HCAP and GNR sepsis. On vanc and ceftaz.   Remains on milrinone, vasopressin and norepi. Co-ox improved but still low.CVP way up. Will restart lasix. Stop vasopressin. Titrate NE for co-ox > 55%.    Remains confused. Overall prognosis remains quite poor.   Abx management per CCM. Appreciate their care    The patient is critically ill with multiple organ systems failure and requires high complexity decision making for assessment and support, frequent evaluation and titration of therapies, application of advanced monitoring technologies and extensive interpretation of multiple databases.   Critical Care Time devoted to patient care services described in this note is 35 Minutes.   Length of Stay: 6  Arvilla Meres MD 04/08/2015, 2:27 PM  Advanced Heart Failure Team Pager [phone] (M-F; 7a - 4p)  Please contact [facility] for night-coverage after hours (4p -7a ) and weekends on [email or web address]

## 2015-04-08 NOTE — Progress Notes (Signed)
I stopped in briefly to check on Christian Wall and Christian Wall.  He opened Christian eyes and said hello but otherwise was noncommunicative and remain confused.  Christian Wall denied having any needs tonight.  She reports she is still, "waiting to see how things turn out." She declined to speak further with me this evening regarding Christian progress or goals of care moving forward.  I will continue to follow up intermittently with the hopes that through continuing relationship building, we will reach a point where she will be willing to talk about long-term goals in light of her husband's chronic disease that continues to worsen.  Please let us know if there is anything else we can specifically do to be of help in the care of Christian Wall.  Romie Minus, MD [facility] Palliative Medicine Team [phone]

## 2015-04-09 ENCOUNTER — Inpatient Hospital Stay (HOSPITAL_COMMUNITY): Payer: Medicare Other

## 2015-04-09 LAB — GLUCOSE, CAPILLARY
GLUCOSE-CAPILLARY: 100 mg/dL — AB (ref 65–99)
GLUCOSE-CAPILLARY: 109 mg/dL — AB (ref 65–99)
GLUCOSE-CAPILLARY: 135 mg/dL — AB (ref 65–99)
Glucose-Capillary: 116 mg/dL — ABNORMAL HIGH (ref 65–99)
Glucose-Capillary: 126 mg/dL — ABNORMAL HIGH (ref 65–99)

## 2015-04-09 LAB — COMPREHENSIVE METABOLIC PANEL
ALT: 108 U/L — ABNORMAL HIGH (ref 17–63)
ANION GAP: 12 (ref 5–15)
AST: 76 U/L — ABNORMAL HIGH (ref 15–41)
Albumin: 2 g/dL — ABNORMAL LOW (ref 3.5–5.0)
Alkaline Phosphatase: 1255 U/L — ABNORMAL HIGH (ref 38–126)
BILIRUBIN TOTAL: 6.8 mg/dL — AB (ref 0.3–1.2)
BUN: 78 mg/dL — ABNORMAL HIGH (ref 6–20)
CO2: 26 mmol/L (ref 22–32)
Calcium: 8.7 mg/dL — ABNORMAL LOW (ref 8.9–10.3)
Chloride: 96 mmol/L — ABNORMAL LOW (ref 101–111)
Creatinine, Ser: 1.62 mg/dL — ABNORMAL HIGH (ref 0.61–1.24)
GFR calc Af Amer: 54 mL/min — ABNORMAL LOW (ref >60)
GFR, EST NON AFRICAN AMERICAN: 47 mL/min — AB (ref >60)
Glucose, Bld: 115 mg/dL — ABNORMAL HIGH (ref 65–99)
POTASSIUM: 4 mmol/L (ref 3.5–5.1)
Sodium: 134 mmol/L — ABNORMAL LOW (ref 135–145)
TOTAL PROTEIN: 6.5 g/dL (ref 6.5–8.1)

## 2015-04-09 LAB — CBC
HEMATOCRIT: 24.6 % — AB (ref 39.0–52.0)
Hemoglobin: 7.8 g/dL — ABNORMAL LOW (ref 13.0–17.0)
MCH: 20.2 pg — ABNORMAL LOW (ref 26.0–34.0)
MCHC: 31.7 g/dL (ref 30.0–36.0)
MCV: 63.7 fL — AB (ref 78.0–100.0)
Platelets: 229 10*3/uL (ref 150–400)
RBC: 3.86 MIL/uL — ABNORMAL LOW (ref 4.22–5.81)
RDW: 19.7 % — AB (ref 11.5–15.5)
WBC: 18.8 10*3/uL — ABNORMAL HIGH (ref 4.0–10.5)

## 2015-04-09 LAB — CARBOXYHEMOGLOBIN
Carboxyhemoglobin: 1.7 % — ABNORMAL HIGH (ref 0.5–1.5)
Methemoglobin: 0.9 % (ref 0.0–1.5)
O2 Saturation: 47.8 %
Total hemoglobin: 8 g/dL — ABNORMAL LOW (ref 13.5–18.0)

## 2015-04-09 LAB — LEGIONELLA PNEUMOPHILA SEROGP 1 UR AG: L. pneumophila Serogp 1 Ur Ag: NEGATIVE

## 2015-04-09 LAB — AMMONIA: AMMONIA: 40 umol/L — AB (ref 9–35)

## 2015-04-09 MED ORDER — ENSURE ENLIVE PO LIQD
237.0000 mL | Freq: Two times a day (BID) | ORAL | Status: DC
  Administered 2015-04-09 – 2015-04-10 (×2): 237 mL via ORAL
  Filled 2015-04-09 (×3): qty 237

## 2015-04-09 MED ORDER — MILRINONE IN DEXTROSE 20 MG/100ML IV SOLN
0.3750 ug/kg/min | INTRAVENOUS | Status: DC
  Administered 2015-04-09 – 2015-04-11 (×4): 0.375 ug/kg/min via INTRAVENOUS
  Filled 2015-04-09 (×4): qty 100

## 2015-04-09 NOTE — Progress Notes (Signed)
Daily Progress Note   Patient Name: Christian Wall       Date: 04/09/2015 DOB: 11-30-1960  Age: 54 y.o. MRN#: [id number] Attending Physician: Oretha Milch, MD Primary Care Physician: Dorrene German, MD Admit Date: 04/02/2015  Reason for Consultation/Follow-up: Establishing goals of care  Subjective: 54 year old male with history of chronic systolic heart failure on milrinone, polysubstance abuse, diabetes, peripheral neuropathy and 6 hospitalizations in the last 6 months. He was most recently admitted for altered mental status and was discharged to [facility]. He is brought to [facility] long ED due to the fact his PICC line had become dislodged. Subsequently transferred to [facility] Palliative care was consulted to continue discussions regarding goals of care moving forward.  Interval Events: I followed up again today with Christian Wall and his wife. She reports understanding he remains critically ill, however she feels as though he is making small improvements. Whenever I talk with her, she has difficulty even discussing anything other than having recovery back to his prior baseline and returning to their home. She is able to verbalize that he is critically ill, however I'm not sure if she truly has processed this fact. She will withdraw from the conversation whenever I try and push discussion about her husband's critical status, and therefore had been focusing on building relationship and progressing goals of care conversation as she is able.   I provided sympathetic listening and support and ensured that she has my card to call if she would like to talk later today.  Length of Stay: 7 days  Current Medications: Scheduled Meds:  . antiseptic oral rinse  7 mL Mouth Rinse BID  . cefTRIAXone (ROCEPHIN)  IV  2 g Intravenous Q24H  . clopidogrel  75 mg Oral Daily  . feeding supplement (ENSURE ENLIVE)  237 mL Oral BID BM  . ferrous sulfate  325 mg Oral Q breakfast  . furosemide  80 mg  Intravenous BID  . heparin subcutaneous  5,000 Units Subcutaneous 3 times per day  . insulin aspart  0-9 Units Subcutaneous Q4H  . olopatadine  1 drop Both Eyes BID  . pantoprazole  40 mg Oral Q1200    Continuous Infusions: . milrinone 0.375 mcg/kg/min (04/09/15 1200)  . norepinephrine (LEVOPHED) Adult infusion 2 mcg/min (04/09/15 1300)    PRN Meds: albuterol, bisacodyl, dextrose, [DISCONTINUED] ondansetron **OR** ondansetron (ZOFRAN) IV  Palliative Performance Scale: 20%     Vital Signs: BP 95/69 mmHg  Pulse 104  Temp(Src) 97.5 F (36.4 C) (Oral)  Resp 18  Ht 5\' 4"  (1.626 m)  Wt 78.699 kg (173 lb 8 oz)  BMI 29.77 kg/m2  SpO2 97% SpO2: SpO2: 97 % O2 Device: O2 Device: Nasal Cannula O2 Flow Rate: O2 Flow Rate (L/min): 2 L/min  Intake/output summary:   Intake/Output Summary (Last 24 hours) at 04/09/15 1823 Last data filed at 04/09/15 1800  Gross per 24 hour  Intake 583.41 ml  Output    827 ml  Net -243.59 ml   LBM:   Baseline Weight: Weight: 81.647 kg (180 lb) Most recent weight: Weight: 78.699 kg (173 lb 8 oz)  Physical Exam: Constitutional: Well-developed, lying in bed, no acute distress CVS: tachycardic, SEM 3/6 Pulmonary: No stridor, faint bibasilar crackles scattered rhonchi  Abdominal: Soft. BS +, no distension, tenderness, rebound or guarding.  Musculoskeletal: Wasting of lower extremity noted, +1 LE edema Neuro: Sleepy. Not as interactive as prior encounters. States only yes to questions  Additional Data Reviewed: Recent Labs  04/08/15  0450  04/09/15  0445  WBC  20.9*  18.8*  HGB  7.8*  7.8*  PLT  217  229  NA  129*  134*  BUN  74*  78*  CREATININE  1.71*  1.62*     Problem List:  Patient Active Problem List   Diagnosis Date Noted  . Hypoxia   . Altered mental state   . Diabetes type 2, uncontrolled (HCC)   . Elevated alkaline phosphatase level   . Metabolic encephalopathy   . Acute respiratory failure with hypoxia (HCC)   .  Vomiting   . Intractable hiccups   . Acute respiratory failure with hypoxia  04/02/2015  . Diabetic nephropathy (HCC) 04/02/2015  . Hypotension 04/02/2015  . Anemia of chronic disease 04/02/2015  . Transaminitis 04/02/2015  . Confusion   . Goals of care, counseling/discussion   . Pulmonary hypertension (HCC)   . Systolic murmur   . Right knee pain   . Acute encephalopathy 03/21/2015  . PAD (peripheral artery disease) (HCC) 03/13/2015  . CKD (chronic kidney disease) stage 3, GFR 30-59 ml/min 03/13/2015  . Pressure ulcer 03/12/2015  . Acute combined systolic and diastolic heart failure, NYHA class 4 (HCC) 03/11/2015  . Shock, cardiogenic (HCC) 02/23/2015  . Lower limb ischemia 02/23/2015  . Hospital acquired PNA 02/23/2015  . Acute on chronic systolic heart failure (HCC) 02/23/2015  . Limb ischemia 02/21/2015  . Ischemic neuropathy of foot 02/21/2015  . HLD (hyperlipidemia)   . Acute renal failure superimposed on stage 3 chronic kidney disease (HCC)   . Depression   . Tobacco abuse   . Acute on chronic systolic CHF (congestive heart failure) (HCC)   . Cigarette smoker 12/19/2014  . Palliative care encounter 11/20/2014  . Diabetes mellitus type II, uncontrolled (HCC) 11/13/2014  . Peripheral neuropathy (HCC) 11/13/2014  . Non-adherence to medical treatment 11/10/2013  . Poor venous access 11/10/2013  . Esophageal thickening 11/10/2013  . Gastric wall thickening 11/10/2013  . Herniation of cervical intervertebral disc with radiculopathy 04/13/2012     Palliative Care Assessment & Plan    Code Status:  Full code  Goals of Care: - Continued aggressive care.  His wife has been having difficulty processing his illness.   - She reports that she remains hopeful that he will continue to improve. We discussed again today that he remains critically ill and has a very poor prognosis even if he recovers from this acute hospitalization. - She has told me in the past that she feels  overwhelmed when talking about end-of-life issues about her husband. She often withdraws during conversation and stops participating when starting to discuss the chronic husband's disease and the fact that he is approaching the end of his life.   - I will continue to follow-up and provide support to family while progressing conversation about long-term goals of care as I am able. - Our team would be happy to participate in joint meeting to discuss goals of care if this would be helpful. - Please let us know if there is anything else our team can do to assist in the care of Mr. Glenetta Hew  Symptom Management:  Denies  Psycho-social/Spiritual:  Desire for further Chaplaincy support:no   Prognosis: Unable to determine due to acute illness. He remains critically ill with poor prognosis Discharge Planning: To be determined.   Care plan was discussed with wife  Thank you for allowing the Palliative Medicine Team to assist in the care of this patient.  Time In: 1650 Time Out: 1705 Total Time 15 Prolonged Time Billed no    Greater than 50%  of this time was spent counseling and coordinating care related to the above assessment and plan.   Romie Minus, MD  04/09/2015, 6:23 PM  Please contact Palliative Medicine Team phone at [phone] for questions and concerns.

## 2015-04-09 NOTE — Progress Notes (Signed)
PULMONARY / CRITICAL CARE MEDICINE   Name: Christian Wall MRN: [id number] DOB: 1960-07-31    ADMISSION DATE:  04/02/2015 CONSULTATION DATE:  04/06/15  REFERRING MD :  Sherral Hammers  CHIEF COMPLAINT:  Hypotension  INITIAL PRESENTATION:  [name] with severe sCHF (EF 15% on chronic milrinone) admitted 9/26 and found to be hypotensive and hypoxic.  On 9/29, he had worsening hypotension and hypoxia requiring BiPAP. CXR revealed worsening right sided infiltrate.  PCCM consulted for transfer to ICU and consideration of vasopressors.   STUDIES:  Echo 02/27/15 >>> severe global reduction in LV function, EF 15%; restrictive filling/ mod reduced RV function, mild MR, mod TR. Abd [place] 9/27 >>> lobulated appearance of the liver with small perihepatic ascites.  GB surgically absent. Brain MRI 9/29 >>> focal encephalomalacia in anterior right frontal lobe is chronic.  No acute process. CXR 9/29 >>> increasing infiltrate in the right lung base.  SIGNIFICANT EVENTS: 9/26 - admit. 9/28 - palliative care consulted > pt's wife not interested. 9/30 - PCCM consulted for hypotension, worsening R sided infiltrate - ? Aspiration.  SUBJECTIVE:  Somnolent this morning. Mumbles a few words then falls asleep.  remains on pressors   VITAL SIGNS: Temp:  [97.5 F (36.4 C)-99.8 F (37.7 C)] 97.5 F (36.4 C) (10/03 0835) Pulse Rate:  [103-115] 108 (10/03 0715) Resp:  [17-72] 27 (10/03 0715) BP: (85-107)/(58-79) 94/67 mmHg (10/03 0715) SpO2:  [93 %-100 %] 95 % (10/03 0645) Weight:  [173 lb 8 oz (78.699 kg)] 173 lb 8 oz (78.699 kg) (10/03 0500) HEMODYNAMICS: CVP:  [21 mmHg-23 mmHg] 23 mmHg VENTILATOR SETTINGS:   INTAKE / OUTPUT: Intake/Output      10/02 0701 - 10/03 0700 10/03 0701 - 10/04 0700   I.V. (mL/kg) 480.5 (6.1)    Other 230    IV Piggyback 300    Total Intake(mL/kg) 1010.5 (12.8)    Urine (mL/kg/hr) 802 (0.4)    Stool 0 (0)    Total Output 802     Net +208.5          Stool Occurrence 1 x       PHYSICAL EXAMINATION: General: [name], appears ill  Neuro: Somnolent, arouses to voice, moves all extremities spontaneously HEENT: [place]/AT. PERRL, sclerae anicteric. Cardiovascular: RRR, 2/6 SEM.  Lungs: Respirations even and unlabored.  Coarse bilaterally R > L. Abdomen: BS x 4, soft, NT/ND.  Musculoskeletal: No gross deformities, no edema. RUE PICC in place. Skin: Intact, warm, no rashes.    LABS:  CBC  Recent Labs Lab 04/07/15 0446 04/08/15 0450 04/09/15 0445  WBC 22.5* 20.9* 18.8*  HGB 8.0* 7.8* 7.8*  HCT 25.0* 24.0* 24.6*  PLT 248 217 229   Coag's  Recent Labs Lab 04/03/15 0329 04/05/15 1245  APTT 35  --   INR 1.49 1.42   BMET  Recent Labs Lab 04/07/15 0446 04/08/15 0450 04/09/15 0445  NA 134* 129* 134*  K 3.6 3.8 4.0  CL 97* 92* 96*  CO2 24 25 26   BUN 69* 74* 78*  CREATININE 1.85* 1.71* 1.62*  GLUCOSE 182* 272* 115*   Electrolytes  Recent Labs Lab 04/03/15 0945  04/05/15 2251  04/07/15 0446 04/08/15 0450 04/09/15 0445  CALCIUM 8.7*  < >  --   < > 8.4* 8.1* 8.7*  MG 2.4  --  1.6*  --  2.1  --   --   PHOS  --   --   --   --  4.8*  --   --   < > =  values in this interval not displayed. Sepsis Markers  Recent Labs Lab 04/02/15 1332 04/02/15 1752 04/05/15 2044 04/06/15 0155 04/07/15 0446 04/08/15 0450  LATICACIDVEN 4.63* 1.46 2.0  --   --   --   PROCALCITON  --   --   --  21.32 27.17 18.74   ABG  Recent Labs Lab 04/05/15 1953  PHART 7.461*  PCO2ART 40.5  PO2ART 50.5*   Liver Enzymes  Recent Labs Lab 04/06/15 0500 04/08/15 0450 04/09/15 0445  AST 36 128* 76*  ALT 40 127* 108*  ALKPHOS 541* 962* 1255*  BILITOT 2.2* 7.0* 6.8*  ALBUMIN 2.3* 2.1* 2.0*   Cardiac Enzymes  Recent Labs Lab 04/05/15 2042 04/06/15 0207 04/06/15 0900  TROPONINI 0.07* 0.14* 0.18*   Glucose  Recent Labs Lab 04/08/15 1152 04/08/15 1618 04/08/15 2009 04/08/15 2355 04/09/15 0416 04/09/15 0741  GLUCAP 195* 139* 120* 100* 109*  116*    Imaging Dg Chest Port 1 View  04/09/2015   CLINICAL DATA:  Pneumonia.  EXAM: PORTABLE CHEST 1 VIEW  COMPARISON:  04/08/2015.  FINDINGS: Right PICC line stable position. Persistent infiltrate right mid and right lower lung. No improvement. Left lung is clear. Stable cardiomegaly. No pulmonary venous congestion. Small right pleural effusion. No pneumothorax. Prior cervical spine fusion.  IMPRESSION: 1. Right PICC line stable position. 2. Persistent right mid and lower lung infiltrate consistent with pneumonia. Small right pleural effusion.   Electronically Signed   By: Marcello Moores  Register   On: 04/09/2015 07:18    ASSESSMENT / PLAN:  PULMONARY A: Acute hypoxic respiratory failure in the setting of possible aspiration vs HCAP-RLL . ? Aspiration event vs HCAP. At risk intubation. Tobacco use disorder. P:   BiPAP as needed,  Abx per ID section. Albuterol PRN Pulmonary hygiene. CXR in AM    CARDIOVASCULAR RUE PICC >>> A:  AoC severe systolic heart failure - on chronic milrinone gtt via PICC. Hypotension - likely infectious / sepsis due to new infiltrate (aspiration vs CAP). Troponin leak - suspect due to demand ischemia; EKG reassuring. P:  Heart failure team following. Continue milrinone gtt, furosemide per heart failure team. Continue pressors, wean to maintain goal MAP > 65. Caution with aggressive IVF resuscitation given his advanced sCHF. Hold outpatient hydralazine, imdur.  RENAL A:   Hypokalemia -resolved  AoCKD.>improving   Hypocalcemia.-mild  P:   Daily bmp    GASTROINTESTINAL A:   Transaminitis - unclear etiology. Hyperammonemia. Alk Phos tr up [phone] GERD. Nutrition.>Swallow eval 10/1 >mild to mod  dysphagia w/ rec D2 diet  Previously on Creon /pancrease PTA  P:   F/u LFT's in AM. Restart lactulose Continue PPI  D2 Diet   HEMATOLOGIC A:   Leukopenia -  lab error    Anemia of chronic disease. VTE Prophylaxis. P:  Transfuse per usual ICU  guidelines. SCD's / Plavix. CBC in AM.  INFECTIOUS A:   Aspiration vs HCAP. Sacral decub ulcers. 10/2 PCT tr dow 27>18  P:   BCx2 9/26 > NEG  BCx2 9/29 >GNR > e coli UCx 9/29 >multi species  Sputum Cx 9/29 > Wound Cx 9/30 >  Abx: Vanc 9/29>>10/2. Abx: Tressie Ellis 9/29>>10/2 Abx: Rocephin, start date 10/2, day 4 of antibiotics PCT algorithm to limit abx exposure.  ENDOCRINE A:   DM. Hypoglycemia - started on D10 9/30>resolved  P:   SSI. D/C  D10  Lantus d/c'd.9/30  NEUROLOGIC A:   Encephalopathy -  MRI brain noted negative. ? Related to elevated ammonia levels  P:   Limit sedating meds. Monitor clinically.    Family updated:  None present   Interdisciplinary Family Meeting v Palliative Care Meeting:  Due by: 10/6.  Algis Greenhouse. Jerline Pain, [place] Resident PGY-2 04/09/2015 8:40 AM   Reviewed above.  See my note for details.  Chesley Mires, MD [facility] Pulmonary/Critical Care 04/09/2015, 2:36 PM Pager:  [phone] After 3pm call: [phone]

## 2015-04-09 NOTE — Progress Notes (Signed)
Nutrition Follow-up  DOCUMENTATION CODES:   Not applicable  INTERVENTION:    Continue to offer Glucerna Shakes between meals.  NUTRITION DIAGNOSIS:   Increased nutrient needs related to wound healing as evidenced by estimated needs.  Ongoing  GOAL:   Patient will meet greater than or equal to 90% of their needs  Unmet  MONITOR:   PO intake, Supplement acceptance, Labs, Weight trends, I & O's   ASSESSMENT:   54 yo male with h/o of chronic systolic HF with EF 20-25%, polysubstance abuse, DM2; presented wtih AMS.  Discussed patient in ICU rounds and with RN today. Patient is refusing to eat. Palliative Care Team is following. Likely plans for transfer back to SNF at time of d/c. Glucerna Shakes are ordered between meals. Patient refused PO's during SLP evaluation this AM. He is at risk for aspiration. Continues to receive a dysphagia 2 diet with thin liquids.  Diet Order:  DIET DYS 2 Room service appropriate?: Yes; Fluid consistency:: Thin  Skin:  Wound (see comment) (unstageable pressure ulcer to right foot; stg IV to coccyx)  Last BM:  10/2  Height:   Ht Readings from Last 1 Encounters:  04/02/15 5\' 4"  (1.626 m)    Weight:   Wt Readings from Last 1 Encounters:  04/09/15 173 lb 8 oz (78.699 kg)    Ideal Body Weight:  54.5 kg  BMI:  Body mass index is 29.77 kg/(m^2).  Estimated Nutritional Needs:   Kcal:  2200-2400  Protein:  115-130 gm  Fluid:  2.2-2.4 L  EDUCATION NEEDS:   No education needs identified at this time  Joaquin Courts, RD, LDN, CNSC Pager [phone] After Hours Pager [phone]

## 2015-04-09 NOTE — Progress Notes (Signed)
Utilization review complete. John Vasconcelos RN CCM Case Mgmt phone [phone] 

## 2015-04-09 NOTE — Progress Notes (Addendum)
Advanced Heart Failure Rounding Note   Subjective:    Admitted with hypoglycemia and replacement of PICC.   Moved to ICU due to RLL HCAP and sepsis. [place] 1/2 with GNR.   Yesterday lasix was restarted. Remains on levophed and milrinone. Co-ox 47%.   Lethargic. Nods his head to some questions but then falls back to sleep.    Objective:   Weight Range:  Vital Signs:   Temp:  [97.5 F (36.4 C)-99.8 F (37.7 C)] 97.5 F (36.4 C) (10/03 0835) Pulse Rate:  [103-115] 106 (10/03 1130) Resp:  [15-72] 20 (10/03 1130) BP: (79-105)/(58-79) 85/58 mmHg (10/03 1130) SpO2:  [93 %-100 %] 99 % (10/03 1130) Weight:  [173 lb 8 oz (78.699 kg)] 173 lb 8 oz (78.699 kg) (10/03 0500) Last BM Date: 04/08/15  Weight change: Filed Weights   04/07/15 0500 04/08/15 0437 04/09/15 0500  Weight: 173 lb 3.2 oz (78.563 kg) 174 lb 6.4 oz (79.107 kg) 173 lb 8 oz (78.699 kg)    Intake/Output:   Intake/Output Summary (Last 24 hours) at 04/09/15 1154 Last data filed at 04/09/15 1100  Gross per 24 hour  Intake 669.57 ml  Output    822 ml  Net -152.43 ml   Physical Exam: CVP 18  General:Lying in bed. Lethargic HEENT: normal Neck: supple. JVP jaw. Carotids 2+ bilat; no bruits. No lymphadenopathy or thryomegaly appreciated. Cor: PMI nondisplaced. Tachy Regular rate & rhythm. No rubs, or murmurs. + S3  Lungs: clear.  Abdomen: soft, nontender, + distended. No hepatosplenomegaly. No bruits or masses. Good bowel sounds. Extremities: cool no cyanosis, clubbing, rash, 2+ edema  RUE double lumen PICC  Neuro: Lethargic  Cranial nerves grossly intact. moves all 4 extremities w/o difficulty.   Telemetry: Sinus Tach    Labs: Basic Metabolic Panel:  Recent Labs Lab 04/03/15 0945  04/05/15 2042 04/05/15 2251 04/06/15 0500 04/07/15 0446 04/08/15 0450 04/09/15 0445  NA 138  < > 138  --  134* 134* 129* 134*  K 4.1  < > 3.0*  --  3.2* 3.6 3.8 4.0  CL 95*  < > 100*  --  95* 97* 92* 96*  CO2 33*  < > 31   --  29 24 25 26   GLUCOSE 160*  < > 45*  --  167* 182* 272* 115*  BUN 72*  < > 66*  --  66* 69* 74* 78*  CREATININE 1.97*  < > 1.85*  --  2.11* 1.85* 1.71* 1.62*  CALCIUM 8.7*  < > 8.4*  --  8.3* 8.4* 8.1* 8.7*  MG 2.4  --   --  1.6*  --  2.1  --   --   PHOS  --   --   --   --   --  4.8*  --   --   < > = values in this interval not displayed.  Liver Function Tests:  Recent Labs Lab 04/03/15 0945 04/05/15 0601 04/06/15 0500 04/08/15 0450 04/09/15 0445  AST 73* 42* 36 128* 76*  ALT 69* 48 40 127* 108*  ALKPHOS 669* 627* 541* 962* 1255*  BILITOT 2.7* 2.7* 2.2* 7.0* 6.8*  PROT 6.9 6.9 6.4* 6.3* 6.5  ALBUMIN 2.5* 2.5* 2.3* 2.1* 2.0*    Recent Labs Lab 04/05/15 1245  LIPASE 15*    Recent Labs Lab 04/06/15 0515 04/08/15 0925 04/09/15 0445  AMMONIA 49* 47* 40*    CBC:  Recent Labs Lab 04/05/15 0010 04/05/15 2042 04/07/15 0446 04/08/15 0450 04/09/15  0445  WBC 7.3 1.8* 22.5* 20.9* 18.8*  HGB 8.3* 8.3* 8.0* 7.8* 7.8*  HCT 26.3* 27.1* 25.0* 24.0* 24.6*  MCV 67.6* 67.6* 64.9* 64.2* 63.7*  PLT 280 270 248 217 229    Cardiac Enzymes:  Recent Labs Lab 04/05/15 2042 04/06/15 0207 04/06/15 0900  TROPONINI 0.07* 0.14* 0.18*    BNP: BNP (last 3 results)  Recent Labs  03/11/15 1702 03/21/15 1200 04/02/15 1032  BNP 1184.5* 824.5* 1932.0*    ProBNP (last 3 results) No results for input(s): PROBNP in the last 8760 hours.    Other results:  Imaging: Dg Chest Port 1 View  04/09/2015   CLINICAL DATA:  Pneumonia.  EXAM: PORTABLE CHEST 1 VIEW  COMPARISON:  04/08/2015.  FINDINGS: Right PICC line stable position. Persistent infiltrate right mid and right lower lung. No improvement. Left lung is clear. Stable cardiomegaly. No pulmonary venous congestion. Small right pleural effusion. No pneumothorax. Prior cervical spine fusion.  IMPRESSION: 1. Right PICC line stable position. 2. Persistent right mid and lower lung infiltrate consistent with pneumonia. Small  right pleural effusion.   Electronically Signed   By: Marcello Moores  Register   On: 04/09/2015 07:18   Dg Chest Port 1 View  04/08/2015   CLINICAL DATA:  Pneumonia  EXAM: PORTABLE CHEST 1 VIEW  COMPARISON:  04/07/2015  FINDINGS: Right lower lobe infiltrate is unchanged.  Small right effusion.  Cardiac enlargement without heart failure. Mild left lower lobe atelectasis/infiltrate is unchanged. PICC tip in the SVC unchanged.  IMPRESSION: Right lower lobe pneumonia unchanged.   Electronically Signed   By: Franchot Gallo M.D.   On: 04/08/2015 07:09     Medications:     Scheduled Medications: . antiseptic oral rinse  7 mL Mouth Rinse BID  . cefTRIAXone (ROCEPHIN)  IV  2 g Intravenous Q24H  . clopidogrel  75 mg Oral Daily  . ferrous sulfate  325 mg Oral Q breakfast  . furosemide  80 mg Intravenous BID  . heparin subcutaneous  5,000 Units Subcutaneous 3 times per day  . insulin aspart  0-9 Units Subcutaneous Q4H  . olopatadine  1 drop Both Eyes BID  . pantoprazole  40 mg Oral Q1200    Infusions: . milrinone    . norepinephrine (LEVOPHED) Adult infusion 2 mcg/min (04/09/15 0900)    PRN Medications: albuterol, bisacodyl, dextrose, [DISCONTINUED] ondansetron **OR** ondansetron (ZOFRAN) IV   Assessment:   1. Hypoglycemia- uncontrolled DM 2. Chronic Systolic Heart Failure on milrinone 3. CKD Stage III 4. Smoker  5. PAD Stent placement R common iliac 02/2015 - on plavix  6. Delirium, acute on chronic 7. Acute Hypoxic Respiratory Failure  8. HCAP versus Aspiration  9. Hypokalemia 10. Sepsis - Bcx 1/2 GNR  Plan/Discussion:    Now with HCAP and GNR sepsis (E.coli). On ceftriaxone.    Remains on milrinone and norepi. Hypotensive this morning. Norepi increased. Co-ox 47%. CVP 18. Continue IV lasix.   Remains confused. Overall prognosis remains quite poor.    Abx management per CCM. Appreciate their care  Length of Stay: 7 CLEGG,AMY NP-C  04/09/2015, 11:54 AM  Advanced Heart Failure  Team Pager [phone] (M-F; 7a - 4p)  Please contact [facility] for night-coverage after hours (4p -7a ) and weekends on [email or web address]   Patient seen and examined with Darrick Grinder, NP. We discussed all aspects of the encounter. I agree with the assessment and plan as stated above.   He continues with multifactorial shock. Co-ox remains low on norepi and  milrinone. CVP very high. Mental status consistently poor (which preceded this current illness). Would continue inotropes and diuresis. Follow co-ox. Not sure that we can provide him with a meaningful outcome even if he survives this critical period.   I am not sure why alk phos climbing so high. May need liver u/s.   The patient is critically ill with multiple organ systems failure and requires high complexity decision making for assessment and support, frequent evaluation and titration of therapies, application of advanced monitoring technologies and extensive interpretation of multiple databases.   Critical Care Time devoted to patient care services described in this note is 35 Minutes.  Bensimhon, Daniel,MD 6:20 PM

## 2015-04-09 NOTE — Progress Notes (Signed)
PCCM PROGRESS NOTE  SUBJECTIVE: Says yes to any questioning.  OBJECTIVE: BP 97/63 mmHg  Pulse 105  Temp(Src) 97.5 F (36.4 C) (Oral)  Resp 18  Ht 5\' 4"  (1.626 m)  Wt 173 lb 8 oz (78.699 kg)  BMI 29.77 kg/m2  SpO2 99%  I/O last 3 completed shifts: In: 2124 [I.V.:1234; Other:340; IV Piggyback:550] Out: 1067 [Urine:1067]  General: somnolent HEENT: mild jaundice Cardiac: regualr, tachycardic Chest: basilar crackles Abd: soft, no guarding Ext: decreased circulation to lower extremities Neuro: intermittently follows commands  CMP Latest Ref Rng 04/09/2015 04/08/2015 04/07/2015  Glucose 65 - 99 mg/dL 686(H) 683(F) 290(S)  BUN 6 - 20 mg/dL 11(D) 55(M) 08(Y)  Creatinine 0.61 - 1.24 mg/dL 2.23(V) 6.12(A) 4.49(P)  Sodium 135 - 145 mmol/L 134(L) 129(L) 134(L)  Potassium 3.5 - 5.1 mmol/L 4.0 3.8 3.6  Chloride 101 - 111 mmol/L 96(L) 92(L) 97(L)  CO2 22 - 32 mmol/L 26 25 24   Calcium 8.9 - 10.3 mg/dL 5.3(Y) 8.1(L) 8.4(L)  Total Protein 6.5 - 8.1 g/dL 6.5 6.3(L) -  Total Bilirubin 0.3 - 1.2 mg/dL 6.8(H) 7.0(H) -  Alkaline Phos 38 - 126 U/L 1255(H) 962(H) -  AST 15 - 41 U/L 76(H) 128(H) -  ALT 17 - 63 U/L 108(H) 127(H) -    CBC Latest Ref Rng 04/09/2015 04/08/2015 04/07/2015  WBC 4.0 - 10.5 K/uL 18.8(H) 20.9(H) 22.5(H)  Hemoglobin 13.0 - 17.0 g/dL 7.8(L) 7.8(L) 8.0(L)  Hematocrit 39.0 - 52.0 % 24.6(L) 24.0(L) 25.0(L)  Platelets 150 - 400 K/uL 229 217 248    Dg Chest Port 1 View  04/09/2015   CLINICAL DATA:  Pneumonia.  EXAM: PORTABLE CHEST 1 VIEW  COMPARISON:  04/08/2015.  FINDINGS: Right PICC line stable position. Persistent infiltrate right mid and right lower lung. No improvement. Left lung is clear. Stable cardiomegaly. No pulmonary venous congestion. Small right pleural effusion. No pneumothorax. Prior cervical spine fusion.  IMPRESSION: 1. Right PICC line stable position. 2. Persistent right mid and lower lung infiltrate consistent with pneumonia. Small right pleural effusion.    Electronically Signed   By: Maisie Fus  Register   On: 04/09/2015 07:18   Dg Chest Port 1 View  04/08/2015   CLINICAL DATA:  Pneumonia  EXAM: PORTABLE CHEST 1 VIEW  COMPARISON:  04/07/2015  FINDINGS: Right lower lobe infiltrate is unchanged.  Small right effusion.  Cardiac enlargement without heart failure. Mild left lower lobe atelectasis/infiltrate is unchanged. PICC tip in the SVC unchanged.  IMPRESSION: Right lower lobe pneumonia unchanged.   Electronically Signed   By: Marlan Palau M.D.   On: 04/08/2015 07:09    ASSESSMENT/PLAN:  Acute hypoxic respiratory failure 2nd to HCAP. E coli bacteremia. Plan: - oxygen to keep SpO2 > 92% - f/u CXR intermittently - Day 5 of abx >> currently on rocephin  Shock >> septic and cardiogenic. Acute on chronic systolic CHF. Plan: - wean pressors to keep MAP > 65 - milrinone, diuresis per cardiology - continue plavix  Elevated LFTs ?from cholestasis in setting of CHF. ? Changes of cirrhosis on abd u/s from 04/03/15. Plan: - f/u LFTs  Acute encephalopathy. Plan: - monitor mental status - continue lactulose  Anemia of critical illness and chronic disease. Plan: - f/u CBC  AKI. Plan: - monitor renal fx  Dysphagia. Plan: - D2 diet - f/u with speech therapy  Goals of care >> f/u with palliative care.   CC time 38 minutes.  Coralyn Helling, MD [facility] Pulmonary/Critical Care 04/09/2015, 2:46 PM Pager:  [phone]  After 3pm call: [phone]

## 2015-04-09 NOTE — Progress Notes (Signed)
Speech Language Pathology Treatment: Dysphagia  Patient Details Name: Bradlyn Underwood MRN: [id number] DOB: 01/11/1961 Today's Date: 04/09/2015 Time: 0205-0215 SLP Time Calculation (min) (ACUTE ONLY): 10 min  Assessment / Plan / Recommendation Clinical Impression  During dysphagia treatment, pt took one large sip of thin liquid with multiple swallows. Pt had a delayed cough, then refused any further po's. SLP provided visual and verbal cues to eat and encouraged him to eat more. Pt continued to refuse po's. Per RN report, pt has been refusing po's all day. SLP will f/u with check for diet tolerance, cannot rule out aspiration at this time. Pt's risk of aspiration is increased by is decreased cognitive and respiratory status.    HPI Other Pertinent Information: [name] with severe sCHF (EF 15% on chronic milrinone) admitted 9/26 and found to be hypotensive and hypoxic. On 9/29, he had worsening hypotension and hypoxia requiring BiPAP. CXR revealed worsening right sided infiltrate. PCCM consulted for transfer to ICU and consideration of vasopressors.   Pertinent Vitals Pain Assessment: Faces Faces Pain Scale: No hurt  SLP Plan  Continue with current plan of care    Recommendations Diet recommendations: Dysphagia 2 (fine chop);Thin liquid Liquids provided via: Cup Medication Administration: Whole meds with liquid Supervision: Patient able to self feed;Full supervision/cueing for compensatory strategies Compensations: Slow rate;Small sips/bites Postural Changes and/or Swallow Maneuvers: Seated upright 90 degrees              Oral Care Recommendations: Oral care BID Plan: Continue with current plan of care    GO    Riccardo Dubin, Student-SLP  Riccardo Dubin 04/09/2015, 2:19 PM

## 2015-04-10 ENCOUNTER — Inpatient Hospital Stay (HOSPITAL_COMMUNITY): Payer: Medicare Other

## 2015-04-10 DIAGNOSIS — I509 Heart failure, unspecified: Secondary | ICD-10-CM | POA: Insufficient documentation

## 2015-04-10 DIAGNOSIS — R748 Abnormal levels of other serum enzymes: Secondary | ICD-10-CM

## 2015-04-10 DIAGNOSIS — J9600 Acute respiratory failure, unspecified whether with hypoxia or hypercapnia: Secondary | ICD-10-CM

## 2015-04-10 DIAGNOSIS — R509 Fever, unspecified: Secondary | ICD-10-CM

## 2015-04-10 DIAGNOSIS — I5020 Unspecified systolic (congestive) heart failure: Secondary | ICD-10-CM

## 2015-04-10 LAB — CBC
HEMATOCRIT: 24.7 % — AB (ref 39.0–52.0)
HEMOGLOBIN: 7.8 g/dL — AB (ref 13.0–17.0)
MCH: 20.4 pg — ABNORMAL LOW (ref 26.0–34.0)
MCHC: 31.6 g/dL (ref 30.0–36.0)
MCV: 64.7 fL — ABNORMAL LOW (ref 78.0–100.0)
Platelets: 245 10*3/uL (ref 150–400)
RBC: 3.82 MIL/uL — AB (ref 4.22–5.81)
RDW: 20.1 % — ABNORMAL HIGH (ref 11.5–15.5)
WBC: 12.9 10*3/uL — AB (ref 4.0–10.5)

## 2015-04-10 LAB — PHOSPHORUS: Phosphorus: 3.3 mg/dL (ref 2.5–4.6)

## 2015-04-10 LAB — COMPREHENSIVE METABOLIC PANEL
ALBUMIN: 2 g/dL — AB (ref 3.5–5.0)
ALK PHOS: 1444 U/L — AB (ref 38–126)
ALT: 99 U/L — ABNORMAL HIGH (ref 17–63)
AST: 80 U/L — AB (ref 15–41)
Anion gap: 11 (ref 5–15)
BILIRUBIN TOTAL: 6.6 mg/dL — AB (ref 0.3–1.2)
BUN: 84 mg/dL — AB (ref 6–20)
CALCIUM: 8.6 mg/dL — AB (ref 8.9–10.3)
CO2: 26 mmol/L (ref 22–32)
Chloride: 96 mmol/L — ABNORMAL LOW (ref 101–111)
Creatinine, Ser: 1.78 mg/dL — ABNORMAL HIGH (ref 0.61–1.24)
GFR calc Af Amer: 49 mL/min — ABNORMAL LOW (ref >60)
GFR calc non Af Amer: 42 mL/min — ABNORMAL LOW (ref >60)
GLUCOSE: 179 mg/dL — AB (ref 65–99)
Potassium: 4.5 mmol/L (ref 3.5–5.1)
SODIUM: 133 mmol/L — AB (ref 135–145)
TOTAL PROTEIN: 6.5 g/dL (ref 6.5–8.1)

## 2015-04-10 LAB — CARBOXYHEMOGLOBIN
CARBOXYHEMOGLOBIN: 1.7 % — AB (ref 0.5–1.5)
METHEMOGLOBIN: 1 % (ref 0.0–1.5)
O2 Saturation: 43.9 %
Total hemoglobin: 7.8 g/dL — ABNORMAL LOW (ref 13.5–18.0)

## 2015-04-10 LAB — CULTURE, BLOOD (ROUTINE X 2): Culture: NO GROWTH

## 2015-04-10 LAB — GLUCOSE, CAPILLARY
GLUCOSE-CAPILLARY: 177 mg/dL — AB (ref 65–99)
GLUCOSE-CAPILLARY: 183 mg/dL — AB (ref 65–99)
Glucose-Capillary: 159 mg/dL — ABNORMAL HIGH (ref 65–99)

## 2015-04-10 LAB — MAGNESIUM: Magnesium: 2.1 mg/dL (ref 1.7–2.4)

## 2015-04-10 MED ORDER — LACTULOSE 10 GM/15ML PO SOLN
30.0000 g | Freq: Four times a day (QID) | ORAL | Status: DC
  Administered 2015-04-10 – 2015-04-11 (×5): 30 g
  Filled 2015-04-10 (×7): qty 45

## 2015-04-10 MED ORDER — PRO-STAT SUGAR FREE PO LIQD
30.0000 mL | Freq: Two times a day (BID) | ORAL | Status: DC
  Administered 2015-04-10 – 2015-04-11 (×2): 30 mL
  Filled 2015-04-10 (×3): qty 30

## 2015-04-10 MED ORDER — VITAL HIGH PROTEIN PO LIQD: 1000.0000 mL | ORAL | Status: DC

## 2015-04-10 MED ORDER — JEVITY 1.2 CAL PO LIQD
1000.0000 mL | ORAL | Status: DC
  Administered 2015-04-10: 1000 mL
  Filled 2015-04-10 (×4): qty 1000

## 2015-04-10 MED ORDER — SODIUM CHLORIDE 0.9 % IV SOLN
INTRAVENOUS | Status: DC
  Administered 2015-04-10: 20:00:00 via INTRAVENOUS

## 2015-04-10 MED ORDER — VITAL HIGH PROTEIN PO LIQD
1000.0000 mL | ORAL | Status: DC
  Administered 2015-04-10: 15:00:00
  Filled 2015-04-10 (×3): qty 1000

## 2015-04-10 NOTE — Progress Notes (Signed)
  Echocardiogram 2D Echocardiogram has been performed.  Christian Wall 04/10/2015, 4:26 PM

## 2015-04-10 NOTE — Progress Notes (Signed)
Advanced Heart Failure Rounding Note   Subjective:    Admitted with hypoglycemia and replacement of PICC.   Moved to ICU due to RLL HCAP and sepsis. [place] 1/2 with GNR.   Remains on milrinone. Off norepi this morning. Co-ox 44%. Continues to diurese with IV lasix. Weight down 1 pound.    Objective:   Weight Range:  Vital Signs:   Temp:  [97.2 F (36.2 C)-98.3 F (36.8 C)] 97.3 F (36.3 C) (10/04 1543) Pulse Rate:  [100-111] 103 (10/04 1500) Resp:  [15-31] 18 (10/04 1500) BP: (81-109)/(54-86) 95/71 mmHg (10/04 1500) SpO2:  [89 %-100 %] 100 % (10/04 1500) FiO2 (%):  [2 %] 2 % (10/04 0200) Last BM Date: 04/08/15  Weight change: Filed Weights   04/07/15 0500 04/08/15 0437 04/09/15 0500  Weight: 173 lb 3.2 oz (78.563 kg) 174 lb 6.4 oz (79.107 kg) 173 lb 8 oz (78.699 kg)    Intake/Output:   Intake/Output Summary (Last 24 hours) at 04/10/15 1549 Last data filed at 04/10/15 1500  Gross per 24 hour  Intake 562.88 ml  Output    870 ml  Net -307.12 ml   Physical Exam: CVP 14  General:Lying in bed. Awake.  HEENT: normal.  Neck: supple. JVP to jaw. Carotids 2+ bilat; no bruits. No lymphadenopathy or thryomegaly appreciated. Cor: PMI nondisplaced. Tachy Regular rate & rhythm. No rubs, or murmurs. + S3  Lungs: clear.  Abdomen: soft, nontender, + distended. No hepatosplenomegaly. No bruits or masses. Good bowel sounds. Extremities: cool no cyanosis, clubbing, rash, 2+ edema  RUE double lumen PICC  Neuro: Oriented to self. Cranial nerves grossly intact. moves all 4 extremities w/o difficulty. R and L had mittens.   Telemetry: Sinus Tach    Labs: Basic Metabolic Panel:  Recent Labs Lab 04/05/15 2251 04/06/15 0500 04/07/15 0446 04/08/15 0450 04/09/15 0445 04/10/15 0412  NA  --  134* 134* 129* 134* 133*  K  --  3.2* 3.6 3.8 4.0 4.5  CL  --  95* 97* 92* 96* 96*  CO2  --  29 24 25 26 26   GLUCOSE  --  167* 182* 272* 115* 179*  BUN  --  66* 69* 74* 78* 84*    CREATININE  --  2.11* 1.85* 1.71* 1.62* 1.78*  CALCIUM  --  8.3* 8.4* 8.1* 8.7* 8.6*  MG 1.6*  --  2.1  --   --   --   PHOS  --   --  4.8*  --   --   --     Liver Function Tests:  Recent Labs Lab 04/05/15 0601 04/06/15 0500 04/08/15 0450 04/09/15 0445 04/10/15 0412  AST 42* 36 128* 76* 80*  ALT 48 40 127* 108* 99*  ALKPHOS 627* 541* 962* 1255* 1444*  BILITOT 2.7* 2.2* 7.0* 6.8* 6.6*  PROT 6.9 6.4* 6.3* 6.5 6.5  ALBUMIN 2.5* 2.3* 2.1* 2.0* 2.0*    Recent Labs Lab 04/05/15 1245  LIPASE 15*    Recent Labs Lab 04/06/15 0515 04/08/15 0925 04/09/15 0445  AMMONIA 49* 47* 40*    CBC:  Recent Labs Lab 04/05/15 2042 04/07/15 0446 04/08/15 0450 04/09/15 0445 04/10/15 0412  WBC 1.8* 22.5* 20.9* 18.8* 12.9*  HGB 8.3* 8.0* 7.8* 7.8* 7.8*  HCT 27.1* 25.0* 24.0* 24.6* 24.7*  MCV 67.6* 64.9* 64.2* 63.7* 64.7*  PLT 270 248 217 229 245    Cardiac Enzymes:  Recent Labs Lab 04/05/15 2042 04/06/15 0207 04/06/15 0900  TROPONINI 0.07* 0.14* 0.18*  BNP: BNP (last 3 results)  Recent Labs  03/11/15 1702 03/21/15 1200 04/02/15 1032  BNP 1184.5* 824.5* 1932.0*    ProBNP (last 3 results) No results for input(s): PROBNP in the last 8760 hours.    Other results:  Imaging: Dg Chest Port 1 View  04/10/2015   CLINICAL DATA:  Acute respiratory failure  EXAM: PORTABLE CHEST 1 VIEW  COMPARISON:  Portable chest x-ray dated April 09, 2015  FINDINGS: The lungs are adequately inflated. Confluent increased interstitial and alveolar opacities in the right lung are less conspicuous today. The cardiac silhouette remains enlarged. The pulmonary vascularity is indistinct. The right-sided PICC line tip projects over the proximal SVC.  IMPRESSION: Slight interval improvement in interstitial infiltrate or asymmetric pulmonary edema. Stable mild cardiomegaly.   Electronically Signed   By: David  [place] M.D.   On: 04/10/2015 07:13   Dg Chest Port 1 View  04/09/2015   CLINICAL  DATA:  Pneumonia.  EXAM: PORTABLE CHEST 1 VIEW  COMPARISON:  04/08/2015.  FINDINGS: Right PICC line stable position. Persistent infiltrate right mid and right lower lung. No improvement. Left lung is clear. Stable cardiomegaly. No pulmonary venous congestion. Small right pleural effusion. No pneumothorax. Prior cervical spine fusion.  IMPRESSION: 1. Right PICC line stable position. 2. Persistent right mid and lower lung infiltrate consistent with pneumonia. Small right pleural effusion.   Electronically Signed   By: Maisie Fus  Register   On: 04/09/2015 07:18   Dg Abd Portable 1v  04/10/2015   CLINICAL DATA:  Feeding tube placement.  EXAM: PORTABLE ABDOMEN - 1 VIEW  COMPARISON:  None.  FINDINGS: The tube extends into the stomach with the tip located in the expected region of the distal antrum. Visualized bowel gas pattern is unremarkable.  IMPRESSION: Feeding tube tip is located in the distal stomach at the expected level of the antrum.   Electronically Signed   By: Irish Lack M.D.   On: 04/10/2015 14:30   US Abdomen Limited Ruq  04/10/2015   CLINICAL DATA:  Elevated liver function tests. Previous cholecystectomy.  EXAM: US ABDOMEN LIMITED - RIGHT UPPER QUADRANT  COMPARISON:  04/03/2015.  FINDINGS: Gallbladder:  Surgically absent.  Common bile duct:  Diameter: 3.9 mm  Liver:  Diffusely echogenic with lobulated contours and previously demonstrated adjacent free peritoneal fluid.  IMPRESSION: 1. No acute abnormality. 2. Stable changes of cirrhosis of the liver with associated ascites.   Electronically Signed   By: Beckie Salts M.D.   On: 04/10/2015 14:13     Medications:     Scheduled Medications: . antiseptic oral rinse  7 mL Mouth Rinse BID  . cefTRIAXone (ROCEPHIN)  IV  2 g Intravenous Q24H  . clopidogrel  75 mg Oral Daily  . feeding supplement (VITAL HIGH PROTEIN)  1,000 mL Per Tube Q24H  . ferrous sulfate  325 mg Oral Q breakfast  . furosemide  80 mg Intravenous BID  . heparin subcutaneous   5,000 Units Subcutaneous 3 times per day  . insulin aspart  0-9 Units Subcutaneous Q4H  . lactulose  30 g Per Tube QID  . olopatadine  1 drop Both Eyes BID  . pantoprazole  40 mg Oral Q1200    Infusions: . milrinone 0.375 mcg/kg/min (04/10/15 0807)  . norepinephrine (LEVOPHED) Adult infusion Stopped (04/10/15 1302)    PRN Medications: albuterol, bisacodyl, dextrose, [DISCONTINUED] ondansetron **OR** ondansetron (ZOFRAN) IV   Assessment:   1. Hypoglycemia- uncontrolled DM 2. Chronic Systolic Heart Failure on milrinone 3. CKD Stage  III 4. Smoker  5. PAD Stent placement R common iliac 02/2015 - on plavix  6. Delirium, acute on chronic 7. Acute Hypoxic Respiratory Failure  8. HCAP versus Aspiration  9. Hypokalemia 10. Sepsis - Bcx 1/2 GNR 11. Malnutrition  Plan/Discussion:    Now with HCAP and GNR sepsis (E.coli). On ceftriaxone.    Remains on milrinone 0.375 mcg. Off norepi earlier this morning.CO-OX down 44%. Continue IV lasix.   NPO due to concern for aspiration. To start tube feeds today.   Remains confused. Overall prognosis remains quite poor.    Abx management per CCM. Appreciate their care  Wife refusing palliative care.   Length of Stay: 8 CLEGG,AMY NP-C  04/10/2015, 3:49 PM  Advanced Heart Failure Team Pager [phone] (M-F; 7a - 4p)  Please contact [facility] for night-coverage after hours (4p -7a ) and weekends on [email or web address]   Patient seen and examined with [name], NP. We discussed all aspects of the encounter. I agree with the assessment and plan as stated above.  Echo images reviewed EF 15-20% with severe RV dysfunction as well.  Remains on milrinone but norepi off. Co-ox consistent with ongoing low output HF. He remains quite lethargic and confused which has been going on for months (even when cardiac output was better). I think we have really reached a point where palliative care is the only feasible option. I tried to broach this subject with  his wife again but she has not been receptive to it. Has failed switch to dobutamine in past and is not candidate for any type of mechanical support. Not sure we have any more to offer him.   The patient is critically ill with multiple organ systems failure and requires high complexity decision making for assessment and support, frequent evaluation and titration of therapies, application of advanced monitoring technologies and extensive interpretation of multiple databases.   Critical Care Time devoted to patient care services described in this note is 35 Minutes.  Bensimhon, Daniel,MD 6:37 PM    .

## 2015-04-10 NOTE — Progress Notes (Signed)
PULMONARY / CRITICAL CARE MEDICINE   Name: Christian Wall MRN: [id number] DOB: Oct 31, 1960    ADMISSION DATE:  04/02/2015 CONSULTATION DATE:  04/06/15  REFERRING MD :  Sherral Hammers  CHIEF COMPLAINT:  Hypotension  INITIAL PRESENTATION:  [name] with severe sCHF (EF 15% on chronic milrinone) admitted 9/26 and found to be hypotensive and hypoxic.  On 9/29, he had worsening hypotension and hypoxia requiring BiPAP. CXR revealed worsening right sided infiltrate.  PCCM consulted for transfer to ICU and consideration of vasopressors.   STUDIES:  Echo 02/27/15 >>> severe global reduction in LV function, EF 15%; restrictive filling/ mod reduced RV function, mild MR, mod TR. Abd [place] 9/27 >>> lobulated appearance of the liver with small perihepatic ascites.  GB surgically absent. Brain MRI 9/29 >>> focal encephalomalacia in anterior right frontal lobe is chronic.  No acute process. CXR 9/29 >>> increasing infiltrate in the right lung base.  SIGNIFICANT EVENTS: 9/26 - admit. 9/28 - palliative care consulted > pt's wife not interested. 9/30 - PCCM consulted for hypotension, worsening R sided infiltrate - ? Aspiration.  SUBJECTIVE:  Awake, remains in shock  VITAL SIGNS: Temp:  [97.2 F (36.2 C)-97.8 F (36.6 C)] 97.8 F (36.6 C) (10/04 0326) Pulse Rate:  [100-110] 107 (10/04 0630) Resp:  [15-31] 20 (10/04 0630) BP: (78-105)/(54-75) 90/67 mmHg (10/04 0630) SpO2:  [89 %-100 %] 97 % (10/04 0630) FiO2 (%):  [2 %] 2 % (10/04 0200) HEMODYNAMICS: CVP:  [18 mmHg-23 mmHg] 20 mmHg VENTILATOR SETTINGS: Vent Mode:  [-]  FiO2 (%):  [2 %] 2 % INTAKE / OUTPUT: Intake/Output      10/03 0701 - 10/04 0700 10/04 0701 - 10/05 0700   P.O. 120    I.V. (mL/kg) 115.8 (1.5)    Other 110    IV Piggyback     Total Intake(mL/kg) 345.8 (4.4)    Urine (mL/kg/hr) 900 (0.5)    Stool     Total Output 900     Net -554.2            PHYSICAL EXAMINATION: General: [name], appears ill  Neuro: awake, says  yes HEENT: [place]/AT. PERRL, icteric sclera Cardiovascular: RRR, 2/6 SEM.  Lungs:   Coarse bilaterally R > L. Abdomen: BS x 4, soft, NT/ND.  Musculoskeletal: No gross deformities, no edema. RUE PICC in place. Skin: Intact, warm, no rashes.    LABS:  CBC  Recent Labs Lab 04/08/15 0450 04/09/15 0445 04/10/15 0412  WBC 20.9* 18.8* 12.9*  HGB 7.8* 7.8* 7.8*  HCT 24.0* 24.6* 24.7*  PLT 217 229 245   Coag's  Recent Labs Lab 04/05/15 1245  INR 1.42   BMET  Recent Labs Lab 04/08/15 0450 04/09/15 0445 04/10/15 0412  NA 129* 134* 133*  K 3.8 4.0 4.5  CL 92* 96* 96*  CO2 25 26 26   BUN 74* 78* 84*  CREATININE 1.71* 1.62* 1.78*  GLUCOSE 272* 115* 179*   Electrolytes  Recent Labs Lab 04/03/15 0945  04/05/15 2251  04/07/15 0446 04/08/15 0450 04/09/15 0445 04/10/15 0412  CALCIUM 8.7*  < >  --   < > 8.4* 8.1* 8.7* 8.6*  MG 2.4  --  1.6*  --  2.1  --   --   --   PHOS  --   --   --   --  4.8*  --   --   --   < > = values in this interval not displayed. Sepsis Markers  Recent Labs Lab  04/05/15 2044 04/06/15 0155 04/07/15 0446 04/08/15 0450  LATICACIDVEN 2.0  --   --   --   PROCALCITON  --  21.32 27.17 18.74   ABG  Recent Labs Lab 04/05/15 1953  PHART 7.461*  PCO2ART 40.5  PO2ART 50.5*   Liver Enzymes  Recent Labs Lab 04/08/15 0450 04/09/15 0445 04/10/15 0412  AST 128* 76* 80*  ALT 127* 108* 99*  ALKPHOS 962* 1255* 1444*  BILITOT 7.0* 6.8* 6.6*  ALBUMIN 2.1* 2.0* 2.0*   Cardiac Enzymes  Recent Labs Lab 04/05/15 2042 04/06/15 0207 04/06/15 0900  TROPONINI 0.07* 0.14* 0.18*   Glucose  Recent Labs Lab 04/09/15 0416 04/09/15 0741 04/09/15 1141 04/09/15 1530 04/09/15 2332 04/10/15 0325  GLUCAP 109* 116* 126* 135* 159* 177*    Imaging Dg Chest Port 1 View  04/10/2015   CLINICAL DATA:  Acute respiratory failure  EXAM: PORTABLE CHEST 1 VIEW  COMPARISON:  Portable chest x-ray dated April 09, 2015  FINDINGS: The lungs are adequately  inflated. Confluent increased interstitial and alveolar opacities in the right lung are less conspicuous today. The cardiac silhouette remains enlarged. The pulmonary vascularity is indistinct. The right-sided PICC line tip projects over the proximal SVC.  IMPRESSION: Slight interval improvement in interstitial infiltrate or asymmetric pulmonary edema. Stable mild cardiomegaly.   Electronically Signed   By: David  Martinique M.D.   On: 04/10/2015 07:13    ASSESSMENT / PLAN:  PULMONARY A: Acute hypoxic respiratory failure in the setting of possible aspiration vs HCAP-RLL . ? Aspiration event vs HCAP. At risk intubation. Tobacco use disorder. P:   BiPAP dc  Abx per ID section. Albuterol PRN Pulmonary hygiene.   CARDIOVASCULAR RUE PICC >>> A:  AoC severe systolic heart failure - on chronic milrinone gtt via PICC. Hypotension - likely infectious / sepsis due to new infiltrate (aspiration vs CAP). Troponin leak - suspect due to demand ischemia; EKG reassuring. P:  Heart failure team following. Continue milrinone gtt, furosemide per heart failure team. Continue pressors, wean to maintain goal MAP > 55 and awake, given EF 15% likely to dc levophed today Caution with aggressive IVF resuscitation given his advanced sCHF. Hold outpatient hydralazine, imdur. If not off pressors, get cortisol  RENAL A:   Hypokalemia -resolved  AoCKD.>improving   Hypocalcemia.-mild  P:   Daily bmp  kvo lasix  GASTROINTESTINAL A:   Transaminitis - unclear etiology. Hyperammonemia. Alk Phos tr up 541>962>1255>1444 GERD. Nutrition.>Swallow eval 10/1 >mild to mod  dysphagia w/ rec D2 diet  Previously on Creon /pancrease PTA  P:   F/u LFT's in AM. Restart lactulose Continue PPI  D2 Diet  Repeat US RUQ, may need CT Dysophagia, drop NGT   HEMATOLOGIC A:   Leukopenia -  lab error    Anemia of chronic disease. VTE Prophylaxis. P:  Transfuse per usual ICU guidelines. SCD's / Plavix. CBC in  AM sub q heparin  INFECTIOUS A:   Aspiration vs HCAP. Bacteremia E coli, (source unclear, less likely lung) Sacral decub ulcers. 10/2 PCT tr dow 27>18  P:   BCx2 9/26 > NEG  BCx2 9/29 >GNR > e coli UCx 9/29 >multi species  Sputum Cx 9/29 > Wound Cx 9/30 >  Abx: Vanc 9/29>>10/2. Abx: Tressie Ellis 9/29>>10/2 Abx: Rocephin, start date 10/2, day 6 of antibiotics PCT algorithm to limit abx exposure - re assuring May need echo for veg, although he is improving, if pos would change duration  ENDOCRINE A:   DM. Hypoglycemia - started on D10  9/30>resolved  P:   SSI. Cortisol if not off pressors   NEUROLOGIC A:   Encephalopathy -  MRI brain noted negative R/o hepatic enceph P:   Lacutolse maintain to 4 BM per day Limit sedating meds. Monitor clinically. Avoid benzo  Family updated:  None present   Interdisciplinary Family Meeting v Palliative Care Meeting:  Due by: 10/6.  Algis Greenhouse. Jerline Pain, [place] Resident PGY-2 04/10/2015 7:42 AM    STAFF NOTE: Linwood Dibbles, MD FACP have personally reviewed patient's available data, including medical history, events of note, physical examination and test results as part of my evaluation. I have discussed with resident/NP and other care providers such as pharmacist, RN and RRT. In addition, I personally evaluated patient and elicited key findings of: remains on levophed, EF 15%, would accept MAP 55 with awake, dysphagia, drop NGT  And feed, continued lasix, ALK [phos noted, repeat US abdo, may need cortisol then stress roids, family updated in full, milrinone still needed, ec oli source not clear to me as PNA?, get repeat US ruq and echo for veg The patient is critically ill with multiple organ systems failure and requires high complexity decision making for assessment and support, frequent evaluation and titration of therapies, application of advanced monitoring technologies and extensive interpretation of multiple  databases.   Critical Care Time devoted to patient care services described in this note is 30 Minutes. This time reflects time of care of this signee: Merrie Roof, MD FACP. This critical care time does not reflect procedure time, or teaching time or supervisory time of PA/NP/Med student/Med Resident etc but could involve care discussion time. Rest per NP/medical resident whose note is outlined above and that I agree with   Lavon Paganini. Titus Mould, MD, [place] Pgr: [facility] 04/10/2015 12:04 PM

## 2015-04-10 NOTE — Care Management Important Message (Signed)
Important Message  Patient Details  Name: Christian Wall MRN: [id number] Date of Birth: 10-01-1960   Medicare Important Message Given:  Yes-second notification given    Kyla Balzarine 04/10/2015, 12:31 PM

## 2015-04-10 NOTE — Progress Notes (Signed)
Speech Language Pathology Treatment: Dysphagia  Patient Details Name: Christian Wall MRN: [id number] DOB: 1960/07/21 Today's Date: 04/10/2015 Time: 0950-1000 SLP Time Calculation (min) (ACUTE ONLY): 10 min  Assessment / Plan / Recommendation Clinical Impression  During dysphagia treatment, pt exhibited overt s/sx of aspiration (wet vocal quality) following small amounts thin liquid and puree. Per RN, pt repeatedly refuses po's and meds, and coughs after po's. Due to AMS, pt has a high risk of aspiration, recommend discussing alternative means of nutrition. SLP will follow to determine po readiness.    HPI Other Pertinent Information: [name] with severe sCHF (EF 15% on chronic milrinone) admitted 9/26 and found to be hypotensive and hypoxic. On 9/29, he had worsening hypotension and hypoxia requiring BiPAP. CXR revealed worsening right sided infiltrate. PCCM consulted for transfer to ICU and consideration of vasopressors.   Pertinent Vitals Pain Assessment: Faces Faces Pain Scale: No hurt  SLP Plan  Goals updated (see note)    Recommendations Diet recommendations: NPO Medication Administration: Via alternative means              Plan: Goals updated (see note)    GO    Riccardo Dubin, Student-SLP  Riccardo Dubin 04/10/2015, 10:17 AM

## 2015-04-10 NOTE — Progress Notes (Signed)
Nutrition Follow-up  DOCUMENTATION CODES:   Not applicable  INTERVENTION:  Initiate TF via NGT with Jevity 1.2 at 25 ml/h and Prostat 30 ml BID on day 1; on day 2, increase to goal rate of 70 ml/h (1680 ml per day) and continue Pro-stat BID to provide 2216 kcals, 123 gm protein, 1361 ml free water daily.   NUTRITION DIAGNOSIS:   Inadequate oral intake related to inability to eat, dysphagia as evidenced by NPO status.   GOAL:   Patient will meet greater than or equal to 90% of their needs  Unmet  MONITOR:   TF tolerance, Labs, Skin, Weight trends, I & O's  REASON FOR ASSESSMENT:   Consult Enteral/tube feeding initiation and management  ASSESSMENT:   54 yo male with h/o of chronic systolic HF with EF 93-71%, polysubstance abuse, DM2; presented wtih AMS.  Pt was re-assessed by SLP and determined to be at severe risk of aspiration. Pt now has panda NGT in place with Vital High protein infusing via NGT at 20 ml/hr with goal rate of 40 ml/hr which would provide 960 kcal and 84 grams of protein. Will change formula and rate of TF to better meet pt's needs. Pt awake at time of visit and simply repeated questions asked.   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  Estimated Nutritional Needs:   Kcal:  2200-2400  Protein:  115-130 gm  Fluid:  2.2-2.4 L/day  EDUCATION NEEDS:   No education needs identified at this time  [place], LDN Inpatient Clinical Dietitian Pager: [phone] After Hours Pager: [phone]

## 2015-04-10 NOTE — Clinical Social Work Note (Signed)
[facility] and plans to [facility] once medically stable.   FL-2 completed and on chart for MD signature.   Clinical Social Worker remains available for continued support and to facilitate pt's discharge needs once medically stable.  Derenda Fennel, MSW, LCSWA [phone] 04/10/2015 11:49 AM

## 2015-04-11 ENCOUNTER — Inpatient Hospital Stay
Admission: AD | Admit: 2015-04-11 | Discharge: 2015-04-20 | Payer: Self-pay | Source: Ambulatory Visit | Attending: Internal Medicine | Admitting: Internal Medicine

## 2015-04-11 ENCOUNTER — Other Ambulatory Visit (HOSPITAL_COMMUNITY): Payer: Self-pay

## 2015-04-11 DIAGNOSIS — R1110 Vomiting, unspecified: Secondary | ICD-10-CM

## 2015-04-11 DIAGNOSIS — B948 Sequelae of other specified infectious and parasitic diseases: Secondary | ICD-10-CM

## 2015-04-11 DIAGNOSIS — D8989 Other specified disorders involving the immune mechanism, not elsewhere classified: Principal | ICD-10-CM

## 2015-04-11 DIAGNOSIS — J189 Pneumonia, unspecified organism: Secondary | ICD-10-CM

## 2015-04-11 DIAGNOSIS — Z4659 Encounter for fitting and adjustment of other gastrointestinal appliance and device: Secondary | ICD-10-CM

## 2015-04-11 LAB — CARBOXYHEMOGLOBIN
CARBOXYHEMOGLOBIN: 1.6 % — AB (ref 0.5–1.5)
METHEMOGLOBIN: 1 % (ref 0.0–1.5)
O2 SAT: 40.6 %
TOTAL HEMOGLOBIN: 8 g/dL — AB (ref 13.5–18.0)

## 2015-04-11 LAB — COMPREHENSIVE METABOLIC PANEL
ALT: 82 U/L — ABNORMAL HIGH (ref 17–63)
AST: 58 U/L — AB (ref 15–41)
Albumin: 2 g/dL — ABNORMAL LOW (ref 3.5–5.0)
Alkaline Phosphatase: 1516 U/L — ABNORMAL HIGH (ref 38–126)
Anion gap: 14 (ref 5–15)
BILIRUBIN TOTAL: 4.9 mg/dL — AB (ref 0.3–1.2)
BUN: 89 mg/dL — AB (ref 6–20)
CO2: 26 mmol/L (ref 22–32)
Calcium: 8.8 mg/dL — ABNORMAL LOW (ref 8.9–10.3)
Chloride: 95 mmol/L — ABNORMAL LOW (ref 101–111)
Creatinine, Ser: 1.88 mg/dL — ABNORMAL HIGH (ref 0.61–1.24)
GFR, EST AFRICAN AMERICAN: 45 mL/min — AB (ref >60)
GFR, EST NON AFRICAN AMERICAN: 39 mL/min — AB (ref >60)
Glucose, Bld: 249 mg/dL — ABNORMAL HIGH (ref 65–99)
POTASSIUM: 3.5 mmol/L (ref 3.5–5.1)
Sodium: 135 mmol/L (ref 135–145)
TOTAL PROTEIN: 6.9 g/dL (ref 6.5–8.1)

## 2015-04-11 LAB — CBC
HEMATOCRIT: 25.3 % — AB (ref 39.0–52.0)
Hemoglobin: 7.9 g/dL — ABNORMAL LOW (ref 13.0–17.0)
MCH: 20.4 pg — AB (ref 26.0–34.0)
MCHC: 31.2 g/dL (ref 30.0–36.0)
MCV: 65.4 fL — AB (ref 78.0–100.0)
PLATELETS: 222 10*3/uL (ref 150–400)
RBC: 3.87 MIL/uL — ABNORMAL LOW (ref 4.22–5.81)
RDW: 20.5 % — AB (ref 11.5–15.5)
WBC: 14.6 10*3/uL — AB (ref 4.0–10.5)

## 2015-04-11 LAB — GLUCOSE, CAPILLARY
GLUCOSE-CAPILLARY: 199 mg/dL — AB (ref 65–99)
GLUCOSE-CAPILLARY: 210 mg/dL — AB (ref 65–99)
GLUCOSE-CAPILLARY: 269 mg/dL — AB (ref 65–99)
Glucose-Capillary: 199 mg/dL — ABNORMAL HIGH (ref 65–99)

## 2015-04-11 LAB — PHOSPHORUS: Phosphorus: 3.7 mg/dL (ref 2.5–4.6)

## 2015-04-11 LAB — MAGNESIUM: Magnesium: 2.3 mg/dL (ref 1.7–2.4)

## 2015-04-11 MED ORDER — DIGOXIN 125 MCG PO TABS: 0.1250 mg | ORAL_TABLET | Freq: Every day | ORAL | Status: DC

## 2015-04-11 MED ORDER — RIFAXIMIN 550 MG PO TABS: 550.0000 mg | ORAL_TABLET | Freq: Two times a day (BID) | ORAL | Status: AC

## 2015-04-11 MED ORDER — DIGOXIN 125 MCG PO TABS: 0.1250 mg | ORAL_TABLET | Freq: Every day | ORAL | Status: AC

## 2015-04-11 MED ORDER — LACTULOSE 10 GM/15ML PO SOLN: 30.0000 g | Freq: Four times a day (QID) | ORAL | Status: DC

## 2015-04-11 MED ORDER — METOCLOPRAMIDE HCL 5 MG/ML IJ SOLN
10.0000 mg | Freq: Four times a day (QID) | INTRAMUSCULAR | Status: AC
  Administered 2015-04-11 (×2): 10 mg via INTRAVENOUS
  Filled 2015-04-11 (×2): qty 2

## 2015-04-11 MED ORDER — RIFAXIMIN 550 MG PO TABS
550.0000 mg | ORAL_TABLET | Freq: Two times a day (BID) | ORAL | Status: DC
  Administered 2015-04-11: 550 mg via ORAL
  Filled 2015-04-11 (×2): qty 1

## 2015-04-11 MED ORDER — DIGOXIN 250 MCG PO TABS
0.5000 mg | ORAL_TABLET | Freq: Once | ORAL | Status: AC
  Administered 2015-04-11: 0.5 mg via ORAL
  Filled 2015-04-11: qty 2

## 2015-04-11 NOTE — Progress Notes (Signed)
eLink Physician-Brief Progress Note Patient Name: Christian Wall DOB: February 19, 1961 MRN: [id number]   Date of Service  04/11/2015  HPI/Events of Note  high residuals  eICU Interventions  reglan x 2 doses Qt ok     Intervention Category Intermediate Interventions: Other:  [name] V. 04/11/2015, 4:47 AM

## 2015-04-11 NOTE — Care Management Note (Signed)
Case Management Note  Patient Details  Name: Christian Wall MRN: [id number] Date of Birth: July 23, 1960  Subjective/Objective:  Remains on  and IV lasix and antibiotics, dc'd milrinone this am.  Plan for tx to tele unit today.  Discussed with physician and agrees with [facility] today if bed available.  Talked with wife in room, explained  [facility] concept.  Liked this concept as higher level than SNF and would like to go there.  If bed available would like to go to [facility].  Referral made.  Physician in agreement for [facility].  [facility]. Plan for [facility] today.                  Action/Plan:   Expected Discharge Date:   (UNKNOWN)               Expected Discharge Plan:  Long Term Acute Care (LTAC)  In-House Referral:  Clinical Social Work  Discharge planning Services  CM Consult  Post Acute Care Choice:    Choice offered to:     DME Arranged:    DME Agency:     HH Arranged:    HH Agency:     Status of Service:  Completed, signed off  Medicare Important Message Given:  Yes-second notification given Date Medicare IM Given:    Medicare IM give by:    Date Additional Medicare IM Given:    Additional Medicare Important Message give by:     If discussed at Long Length of Stay Meetings, dates discussed:    Additional Comments:  Vangie Bicker, RN 04/11/2015, 11:50 AM

## 2015-04-11 NOTE — Progress Notes (Signed)
Advanced Heart Failure Rounding Note   Subjective:    Admitted with hypoglycemia and replacement of PICC.   Moved to ICU due to RLL HCAP and sepsis. [place] 1/2 with GNR.   Remains on milrinone. Off norepi. Co-ox 40%. Remains confused. CVP 15   Objective:   Weight Range:  Vital Signs:   Temp:  [96.6 F (35.9 C)-98.6 F (37 C)] 98.6 F (37 C) (10/05 0737) Pulse Rate:  [103-112] 112 (10/05 0800) Resp:  [14-30] 18 (10/05 0800) BP: (80-109)/(57-86) 98/64 mmHg (10/05 0800) SpO2:  [91 %-100 %] 98 % (10/05 0800) Weight:  [78.7 kg (173 lb 8 oz)] 78.7 kg (173 lb 8 oz) (10/05 0500) Last BM Date: 04/08/15  Weight change: Filed Weights   04/08/15 0437 04/09/15 0500 04/11/15 0500  Weight: 79.107 kg (174 lb 6.4 oz) 78.699 kg (173 lb 8 oz) 78.7 kg (173 lb 8 oz)    Intake/Output:   Intake/Output Summary (Last 24 hours) at 04/11/15 0940 Last data filed at 04/11/15 0800  Gross per 24 hour  Intake 1103.62 ml  Output    765 ml  Net 338.62 ml   Physical Exam: CVP 15  General:Lying in bed. Awake. Confused HEENT: normal.  Neck: supple. JVP to jaw. Carotids 2+ bilat; no bruits. No lymphadenopathy or thryomegaly appreciated. Cor: PMI nondisplaced. Tachy Regular rate & rhythm. No rubs, or murmurs. + S3  Lungs: clear.  Abdomen: soft, nontender, + distended. No hepatosplenomegaly. No bruits or masses. Good bowel sounds. Extremities: cool no cyanosis, clubbing, rash, 2+ edema  RUE double lumen PICC  Neuro: Oriented to self. Cranial nerves grossly intact. moves all 4 extremities w/o difficulty. R and L had mittens.   Telemetry: Sinus Tach    Labs: Basic Metabolic Panel:  Recent Labs Lab 04/05/15 2251 04/06/15 0500 04/07/15 0446 04/08/15 0450 04/09/15 0445 04/10/15 0412 04/10/15 1632 04/11/15 0456  NA  --  134* 134* 129* 134* 133*  --   --   K  --  3.2* 3.6 3.8 4.0 4.5  --   --   CL  --  95* 97* 92* 96* 96*  --   --   CO2  --  29 24 25 26 26   --   --   GLUCOSE  --  167*  182* 272* 115* 179*  --   --   BUN  --  66* 69* 74* 78* 84*  --   --   CREATININE  --  2.11* 1.85* 1.71* 1.62* 1.78*  --   --   CALCIUM  --  8.3* 8.4* 8.1* 8.7* 8.6*  --   --   MG 1.6*  --  2.1  --   --   --  2.1 2.3  PHOS  --   --  4.8*  --   --   --  3.3 3.7    Liver Function Tests:  Recent Labs Lab 04/05/15 0601 04/06/15 0500 04/08/15 0450 04/09/15 0445 04/10/15 0412  AST 42* 36 128* 76* 80*  ALT 48 40 127* 108* 99*  ALKPHOS 627* 541* 962* 1255* 1444*  BILITOT 2.7* 2.2* 7.0* 6.8* 6.6*  PROT 6.9 6.4* 6.3* 6.5 6.5  ALBUMIN 2.5* 2.3* 2.1* 2.0* 2.0*    Recent Labs Lab 04/05/15 1245  LIPASE 15*    Recent Labs Lab 04/06/15 0515 04/08/15 0925 04/09/15 0445  AMMONIA 49* 47* 40*    CBC:  Recent Labs Lab 04/07/15 0446 04/08/15 0450 04/09/15 0445 04/10/15 0412 04/11/15 0832  WBC 22.5*  20.9* 18.8* 12.9* 14.6*  HGB 8.0* 7.8* 7.8* 7.8* 7.9*  HCT 25.0* 24.0* 24.6* 24.7* 25.3*  MCV 64.9* 64.2* 63.7* 64.7* 65.4*  PLT 248 217 229 245 222    Cardiac Enzymes:  Recent Labs Lab 04/05/15 2042 04/06/15 0207 04/06/15 0900  TROPONINI 0.07* 0.14* 0.18*    BNP: BNP (last 3 results)  Recent Labs  03/11/15 1702 03/21/15 1200 04/02/15 1032  BNP 1184.5* 824.5* 1932.0*    ProBNP (last 3 results) No results for input(s): PROBNP in the last 8760 hours.    Other results:  Imaging: Dg Chest Port 1 View  04/10/2015   CLINICAL DATA:  Acute respiratory failure  EXAM: PORTABLE CHEST 1 VIEW  COMPARISON:  Portable chest x-ray dated April 09, 2015  FINDINGS: The lungs are adequately inflated. Confluent increased interstitial and alveolar opacities in the right lung are less conspicuous today. The cardiac silhouette remains enlarged. The pulmonary vascularity is indistinct. The right-sided PICC line tip projects over the proximal SVC.  IMPRESSION: Slight interval improvement in interstitial infiltrate or asymmetric pulmonary edema. Stable mild cardiomegaly.    Electronically Signed   By: David  Martinique M.D.   On: 04/10/2015 07:13   Dg Abd Portable 1v  04/10/2015   CLINICAL DATA:  Feeding tube placement.  EXAM: PORTABLE ABDOMEN - 1 VIEW  COMPARISON:  None.  FINDINGS: The tube extends into the stomach with the tip located in the expected region of the distal antrum. Visualized bowel gas pattern is unremarkable.  IMPRESSION: Feeding tube tip is located in the distal stomach at the expected level of the antrum.   Electronically Signed   By: Aletta Edouard M.D.   On: 04/10/2015 14:30   US Abdomen Limited Ruq  04/10/2015   CLINICAL DATA:  Elevated liver function tests. Previous cholecystectomy.  EXAM: US ABDOMEN LIMITED - RIGHT UPPER QUADRANT  COMPARISON:  04/03/2015.  FINDINGS: Gallbladder:  Surgically absent.  Common bile duct:  Diameter: 3.9 mm  Liver:  Diffusely echogenic with lobulated contours and previously demonstrated adjacent free peritoneal fluid.  IMPRESSION: 1. No acute abnormality. 2. Stable changes of cirrhosis of the liver with associated ascites.   Electronically Signed   By: Claudie Revering M.D.   On: 04/10/2015 14:13     Medications:     Scheduled Medications: . antiseptic oral rinse  7 mL Mouth Rinse BID  . cefTRIAXone (ROCEPHIN)  IV  2 g Intravenous Q24H  . clopidogrel  75 mg Oral Daily  . [START ON 04/12/2015] digoxin  0.125 mg Oral Daily  . digoxin  0.5 mg Oral Once  . feeding supplement (PRO-STAT SUGAR FREE 64)  30 mL Per Tube BID  . ferrous sulfate  325 mg Oral Q breakfast  . furosemide  80 mg Intravenous BID  . heparin subcutaneous  5,000 Units Subcutaneous 3 times per day  . insulin aspart  0-9 Units Subcutaneous Q4H  . lactulose  30 g Per Tube QID  . metoCLOPramide (REGLAN) injection  10 mg Intravenous 4 times per day  . olopatadine  1 drop Both Eyes BID  . pantoprazole  40 mg Oral Q1200    Infusions: . sodium chloride 10 mL/hr at 04/10/15 2000  . feeding supplement (JEVITY 1.2 CAL) 1,000 mL (04/10/15 1743)  .  norepinephrine (LEVOPHED) Adult infusion Stopped (04/10/15 1302)    PRN Medications: albuterol, bisacodyl, dextrose, [DISCONTINUED] ondansetron **OR** ondansetron (ZOFRAN) IV   Assessment:   1. Hypoglycemia- uncontrolled DM 2. Chronic Systolic Heart Failure on milrinone 3. CKD  Stage III 4. Smoker  5. PAD Stent placement R common iliac 02/2015 - on plavix  6. Delirium, acute on chronic 7. Acute Hypoxic Respiratory Failure  8. HCAP versus Aspiration  9. Hypokalemia 10. Sepsis - Bcx 1/2 GNR 11. Malnutrition 12. Elevated alk phos  Plan/Discussion:     Echo images reviewed EF 15-20% with severe RV dysfunction as well.  Remains on milrinone but norepi off. Co-ox consistent with ongoing low output HF despite fairly high-dose milrinone. He has also failed dobutamine in past. I think he now clearly has end-stage HF and the only option is Palliative Care but his wife has refused this. I would stop milrinone as it not working.  Alk phos continues to climb. Ab u/s reviewed and not cholestatic source. Thus I worry about possible bone source/osteo. Would consider check ESR. Although nonspecific, if markedly elevated may point to smoldering infection somewhere.   Once again however, with his HF I would favor stopping inotropes and proceeding with Palliatice Care.   I discussed with Dr. Titus Mould and CCM team at bedside.   Critical Care Time devoted to patient care services described in this note is 35 Minutes.  Paolina Karwowski,MD 9:40 AM    .

## 2015-04-11 NOTE — Progress Notes (Signed)
PULMONARY / CRITICAL CARE MEDICINE   Name: Christian Wall MRN: [id number] DOB: 10-05-1960    ADMISSION DATE:  04/02/2015 CONSULTATION DATE:  04/06/15  REFERRING MD :  Sherral Hammers  CHIEF COMPLAINT:  Hypotension  INITIAL PRESENTATION:  [name] with severe sCHF (EF 15% on chronic milrinone) admitted 9/26 and found to be hypotensive and hypoxic.  On 9/29, he had worsening hypotension and hypoxia requiring BiPAP. CXR revealed worsening right sided infiltrate.  PCCM consulted for transfer to ICU and consideration of vasopressors.   STUDIES:  Echo 02/27/15 >>> severe global reduction in LV function, EF 15%; restrictive filling/ mod reduced RV function, mild MR, mod TR. Abd [place] 9/27 >>> lobulated appearance of the liver with small perihepatic ascites.  GB surgically absent. Brain MRI 9/29 >>> focal encephalomalacia in anterior right frontal lobe is chronic.  No acute process. CXR 9/29 >>> increasing infiltrate in the right lung base. RUQ [place] 10/4 >>> No acute abnormality, stable changes of the liver with associated ascites.  TTE 10/4 >>> EF 15-20%, no vegetations  SIGNIFICANT EVENTS: 9/26 - admit. 9/28 - palliative care consulted > pt's wife not interested. 9/30 - PCCM consulted for hypotension, worsening R sided infiltrate - ? Aspiration.  SUBJECTIVE:  Awake, follows come commands, repeats words  VITAL SIGNS: Temp:  [96.6 F (35.9 C)-98.3 F (36.8 C)] 96.9 F (36.1 C) (10/05 0334) Pulse Rate:  [103-111] 109 (10/05 0700) Resp:  [14-30] 18 (10/05 0700) BP: (80-109)/(57-86) 83/65 mmHg (10/05 0700) SpO2:  [91 %-100 %] 96 % (10/05 0700) Weight:  [173 lb 8 oz (78.7 kg)] 173 lb 8 oz (78.7 kg) (10/05 0500) HEMODYNAMICS: CVP:  [12 mmHg-16 mmHg] 15 mmHg VENTILATOR SETTINGS:   INTAKE / OUTPUT: Intake/Output      10/04 0701 - 10/05 0700 10/05 0701 - 10/06 0700   P.O.     I.V. (mL/kg) 499.2 (6.3)    Other 120    NG/GT 585.1    IV Piggyback 50    Total Intake(mL/kg) 1254.3 (15.9)    Urine  (mL/kg/hr) 810 (0.4)    Emesis/NG output 0 (0)    Stool 0 (0)    Total Output 810     Net +444.3          Stool Occurrence 1 x    Emesis Occurrence 1 x      PHYSICAL EXAMINATION: General: [name], appears ill  Neuro: awake, says ok HEENT: [place]/AT. PERRL, icteric sclera Cardiovascular: RRR, 2/6 SEM.  Lungs: Coarse bilaterally R > L. Abdomen: BS x 4, soft, NT/ND.  Musculoskeletal: No gross deformities, no edema. RUE PICC in place. Skin: Intact, warm, no rashes.    LABS:  CBC  Recent Labs Lab 04/08/15 0450 04/09/15 0445 04/10/15 0412  WBC 20.9* 18.8* 12.9*  HGB 7.8* 7.8* 7.8*  HCT 24.0* 24.6* 24.7*  PLT 217 229 245   Coag's  Recent Labs Lab 04/05/15 1245  INR 1.42   BMET  Recent Labs Lab 04/08/15 0450 04/09/15 0445 04/10/15 0412  NA 129* 134* 133*  K 3.8 4.0 4.5  CL 92* 96* 96*  CO2 25 26 26   BUN 74* 78* 84*  CREATININE 1.71* 1.62* 1.78*  GLUCOSE 272* 115* 179*   Electrolytes  Recent Labs Lab 04/07/15 0446 04/08/15 0450 04/09/15 0445 04/10/15 0412 04/10/15 1632 04/11/15 0456  CALCIUM 8.4* 8.1* 8.7* 8.6*  --   --   MG 2.1  --   --   --  2.1 2.3  PHOS 4.8*  --   --   --  3.3 3.7   Sepsis Markers  Recent Labs Lab 04/05/15 2044 04/06/15 0155 04/07/15 0446 04/08/15 0450  LATICACIDVEN 2.0  --   --   --   PROCALCITON  --  21.32 27.17 18.74   ABG  Recent Labs Lab 04/05/15 1953  PHART 7.461*  PCO2ART 40.5  PO2ART 50.5*   Liver Enzymes  Recent Labs Lab 04/08/15 0450 04/09/15 0445 04/10/15 0412  AST 128* 76* 80*  ALT 127* 108* 99*  ALKPHOS 962* 1255* 1444*  BILITOT 7.0* 6.8* 6.6*  ALBUMIN 2.1* 2.0* 2.0*   Cardiac Enzymes  Recent Labs Lab 04/05/15 2042 04/06/15 0207 04/06/15 0900  TROPONINI 0.07* 0.14* 0.18*   Glucose  Recent Labs Lab 04/09/15 1530 04/09/15 2332 04/10/15 0325 04/10/15 1231 04/11/15 0033 04/11/15 0331  GLUCAP 135* 159* 177* 183* 210* 199*    Imaging Dg Abd Portable 1v  04/10/2015    CLINICAL DATA:  Feeding tube placement.  EXAM: PORTABLE ABDOMEN - 1 VIEW  COMPARISON:  None.  FINDINGS: The tube extends into the stomach with the tip located in the expected region of the distal antrum. Visualized bowel gas pattern is unremarkable.  IMPRESSION: Feeding tube tip is located in the distal stomach at the expected level of the antrum.   Electronically Signed   By: Aletta Edouard M.D.   On: 04/10/2015 14:30   US Abdomen Limited Ruq  04/10/2015   CLINICAL DATA:  Elevated liver function tests. Previous cholecystectomy.  EXAM: US ABDOMEN LIMITED - RIGHT UPPER QUADRANT  COMPARISON:  04/03/2015.  FINDINGS: Gallbladder:  Surgically absent.  Common bile duct:  Diameter: 3.9 mm  Liver:  Diffusely echogenic with lobulated contours and previously demonstrated adjacent free peritoneal fluid.  IMPRESSION: 1. No acute abnormality. 2. Stable changes of cirrhosis of the liver with associated ascites.   Electronically Signed   By: Claudie Revering M.D.   On: 04/10/2015 14:13    ASSESSMENT / PLAN:  PULMONARY A: Acute hypoxic respiratory failure in the setting of possible aspiration vs HCAP-RLL . ? Aspiration event vs HCAP. At risk intubation. Tobacco use disorder. P:   BiPAP dc  Abx per ID section. Albuterol PRN Pulmonary hygiene.   CARDIOVASCULAR RUE PICC >>> A:  AoC severe systolic heart failure - on chronic milrinone gtt via PICC. Hypotension - likely infectious / sepsis due to new infiltrate (aspiration vs CAP). Troponin leak - suspect due to demand ischemia; EKG reassuring. P:  Heart failure team following. Continue furosemide per heart failure team. Continue pressors, wean to maintain goal MAP > 55 and awake, given EF 15% Off levophed Hold outpatient hydralazine, imdur. If not off pressors, get cortisol Will discuss with chf service to dc milrinone?  RENAL A:   Hypokalemia -resolved  AoCKD.>improving   Hypocalcemia.-mild  P:   Daily bmp awaited, may drive lasix  changes kvo lasix  GASTROINTESTINAL A:   Transaminitis - unclear etiology. Hyperammonemia. Alk Phos tr up [id number] GERD. Nutrition.>Swallow eval 10/1 >mild to mod  dysphagia w/ rec D2 diet  Previously on Creon /pancrease PTA  P:   F/u LFT's in AM. Restart lactulose Continue PPI  D2 Diet  RUQ [place] unrearkable, may need CT Dysphagia, drop NGT May need MRI foot for osteo?  HEMATOLOGIC A:   Leukopenia -  lab error    Anemia of chronic disease. VTE Prophylaxis. P:  Transfuse per usual ICU guidelines. SCD's / Plavix. CBC in AM sub q heparin  INFECTIOUS A:   Aspiration vs HCAP. Bacteremia E coli, (source unclear,  less likely lung) Sacral decub ulcers. 10/2 PCT tr dow 27>18  P:   BCx2 9/26 > NEG  BCx2 9/29 >GNR > e coli UCx 9/29 >multi species  Sputum Cx 9/29 > Wound Cx 9/30 >  Abx: Vanc 9/29>>10/2. Abx: Tressie Ellis 9/29>>10/2 Abx: Rocephin, start date 10/2, day 7 of antibiotics PCT algorithm to limit abx exposure - re assuring TTE: No vegetations.  For 10 days Repeat BC  ENDOCRINE A:   DM. Hypoglycemia - started on D10 9/30>resolved  P:   SSI. Cortisol if drops BP  NEUROLOGIC A:   Encephalopathy -  MRI brain noted negative R/o hepatic enceph P:   Lacutolse maintain to 4 BM per day Limit sedating meds. Monitor clinically. Avoid benzo  Family updated:  None present   Interdisciplinary Family Meeting v Palliative Care Meeting:  Due by: 10/6.  Algis Greenhouse. Jerline Pain, [place] Resident PGY-2 04/11/2015 7:32 AM     STAFF NOTE: Linwood Dibbles, MD FACP have personally reviewed patient's available data, including medical history, events of note, physical examination and test results as part of my evaluation. I have discussed with resident/NP and other care providers such as pharmacist, RN and RRT. In addition, I personally evaluated patient and elicited key findings of: confusion remains, less coarse BS, abdo soft, ALK phos  noted, [place] neg x 2 for source, cirrhosis noted, milrinone to dc as failure to respond per chf service, BP maintaining well but co ox poor , but has not responded to ANY therapy for this, await chem this am , lasix may need reduction, consider maximal treatment lactulose and increase, may need official pall care consult, move to tele, avoid bezno, may need mri foot osteo, source alk phos?, prognosis poor  The patient is critically ill with multiple organ systems failure and requires high complexity decision making for assessment and support, frequent evaluation and titration of therapies, application of advanced monitoring technologies and extensive interpretation of multiple databases.   Critical Care Time devoted to patient care services described in this note is30 Minutes. This time reflects time of care of this signee: Merrie Roof, MD FACP. This critical care time does not reflect procedure time, or teaching time or supervisory time of PA/NP/Med student/Med Resident etc but could involve care discussion time. Rest per NP/medical resident whose note is outlined above and that I agree with   Lavon Paganini. Titus Mould, MD, [place] Pgr: [place] [facility] 04/11/2015 9:20 AM

## 2015-04-11 NOTE — Discharge Summary (Signed)
Physician Discharge Summary  Patient ID: Christian Wall MRN: [id number] DOB/AGE: Jun 12, 1961 54 y.o.  Admit date: 04/02/2015 Discharge date: 04/11/2015  Admission Diagnoses: Acute respiratory failure with hypoxia  Discharge Diagnoses:  Principal Problem:   Acute respiratory failure with hypoxia  Active Problems:   Diabetes mellitus type II, uncontrolled (HCC)   Peripheral neuropathy (HCC)   Cigarette smoker   Acute on chronic systolic CHF (congestive heart failure) (HCC)   Acute renal failure superimposed on stage 3 chronic kidney disease (HCC)   Tobacco abuse   Diabetic nephropathy (HCC)   Hypotension   Anemia of chronic disease   Transaminitis   Metabolic encephalopathy   Acute respiratory failure with hypoxia (HCC)   Vomiting   Intractable hiccups   Altered mental state   Diabetes type 2, uncontrolled (HCC)   Elevated alkaline phosphatase level   Hypoxia   Alkaline phosphatase elevation   CHF (congestive heart failure) ([place])   Discharged Condition: stable  Hospital Course:  Patient is a 54 year old an who presented from [facility] to ED with dislodged PICC line placement (patient was on chronic milrinone drip administration). In the ED, he was noted to be hypotensive and hypoxic and subsequently admitted. Brain MRI was performed on 9/29 for encephalopathy. During the procedure, he was given Versed and Ativan and subsequently became more encephalopathic and hypoxic, requiring BiPAP and Pressors. CXR was performed which showed worsening right lower lobe disease. Patient was started on broad spectrum antibiotics. His blood culture was positive for e coli and his antibiotics were narrowed to ceftriaxone only. Our heart failure team was involved with this patient's care. He was gradually weaned of pressor support. Echocardiogram was repeated which showed stable EF of 15-20%. The patient's milrinone was stopped due to treatment failure. He was started on digoxin prior to  discharge.  We started the patient on lactulose and rifaximin to treat any hepatic component of his encephalopathy (given cirrhosis seen on RUQ [place]).  Patient was additionally incidentally found to have an elevated alkaline phosphatase level. RUQ was performed which showed stable cirrhotic changes but no acute biliary pathology.   Patient was then transferred to [facility] facility per request of the patient's wife.   Issues for follow up: - Discharged on ceftriaxone for e coli bacteremia, stop date 04/14/2015 - Consider bone scan to look for other etiologies of elevated alk phos - Patient would likely benefit from palliative care at this point - patient's wife is reluctant to this.  - Recommend follow up renal function and volume status after discharge. Was discharged on home torsemide 12m bid - may need adjustment after discharge.   Consults: cardiology  Discharge Exam: Blood pressure 99/74, pulse 111, temperature 98 F (36.7 C), temperature source Oral, resp. rate 21, height 5' 4"  (1.626 m), weight 173 lb 8 oz (78.7 kg), SpO2 97 %. General: [name], appears ill  Neuro: awake, says ok HEENT: [place]/AT. PERRL, icteric sclera Cardiovascular: RRR, 2/6 SEM.  Lungs: Coarse bilaterally R > L. Abdomen: BS x 4, soft, NT/ND.  Musculoskeletal: No gross deformities, no edema. RUE PICC in place. Skin: Intact, warm, no rashes.  Disposition: [facility]ACH  STUDIES:  Echo 02/27/15 >>> severe global reduction in LV function, EF 15%; restrictive filling/ mod reduced RV function, mild MR, mod TR. Abd [place]9/27 >>> lobulated appearance of the liver with small perihepatic ascites. GB surgically absent. Brain MRI 9/29 >>> focal encephalomalacia in anterior right frontal lobe is chronic. No acute process. CXR 9/29 >>>  increasing infiltrate in the right lung base. RUQ [place] 10/4 >>> No acute abnormality, stable changes of the liver with associated ascites.  TTE 10/4 >>> EF 15-20%, no vegetations  Results for  orders placed or performed during the hospital encounter of 04/02/15 (from the past 24 hour(s))  Magnesium     Status: None   Collection Time: 04/10/15  4:32 PM  Result Value Ref Range   Magnesium 2.1 1.7 - 2.4 mg/dL  Phosphorus     Status: None   Collection Time: 04/10/15  4:32 PM  Result Value Ref Range   Phosphorus 3.3 2.5 - 4.6 mg/dL  Glucose, capillary     Status: Abnormal   Collection Time: 04/11/15 12:33 AM  Result Value Ref Range   Glucose-Capillary 210 (H) 65 - 99 mg/dL   Comment 1 Notify RN    Comment 2 Document in Chart   Glucose, capillary     Status: Abnormal   Collection Time: 04/11/15  3:31 AM  Result Value Ref Range   Glucose-Capillary 199 (H) 65 - 99 mg/dL  Carboxyhemoglobin     Status: Abnormal   Collection Time: 04/11/15  4:00 AM  Result Value Ref Range   Total hemoglobin 8.0 (L) 13.5 - 18.0 g/dL   O2 Saturation 40.6 %   Carboxyhemoglobin 1.6 (H) 0.5 - 1.5 %   Methemoglobin 1.0 0.0 - 1.5 %  Magnesium     Status: None   Collection Time: 04/11/15  4:56 AM  Result Value Ref Range   Magnesium 2.3 1.7 - 2.4 mg/dL  Phosphorus     Status: None   Collection Time: 04/11/15  4:56 AM  Result Value Ref Range   Phosphorus 3.7 2.5 - 4.6 mg/dL  Glucose, capillary     Status: Abnormal   Collection Time: 04/11/15  7:33 AM  Result Value Ref Range   Glucose-Capillary 199 (H) 65 - 99 mg/dL  CBC     Status: Abnormal   Collection Time: 04/11/15  8:32 AM  Result Value Ref Range   WBC 14.6 (H) 4.0 - 10.5 K/uL   RBC 3.87 (L) 4.22 - 5.81 MIL/uL   Hemoglobin 7.9 (L) 13.0 - 17.0 g/dL   HCT 25.3 (L) 39.0 - 52.0 %   MCV 65.4 (L) 78.0 - 100.0 fL   MCH 20.4 (L) 26.0 - 34.0 pg   MCHC 31.2 30.0 - 36.0 g/dL   RDW 20.5 (H) 11.5 - 15.5 %   Platelets 222 150 - 400 K/uL  Comprehensive metabolic panel     Status: Abnormal   Collection Time: 04/11/15  8:32 AM  Result Value Ref Range   Sodium 135 135 - 145 mmol/L   Potassium 3.5 3.5 - 5.1 mmol/L   Chloride 95 (L) 101 - 111 mmol/L    CO2 26 22 - 32 mmol/L   Glucose, Bld 249 (H) 65 - 99 mg/dL   BUN 89 (H) 6 - 20 mg/dL   Creatinine, Ser 1.88 (H) 0.61 - 1.24 mg/dL   Calcium 8.8 (L) 8.9 - 10.3 mg/dL   Total Protein 6.9 6.5 - 8.1 g/dL   Albumin 2.0 (L) 3.5 - 5.0 g/dL   AST 58 (H) 15 - 41 U/L   ALT 82 (H) 17 - 63 U/L   Alkaline Phosphatase 1516 (H) 38 - 126 U/L   Total Bilirubin 4.9 (H) 0.3 - 1.2 mg/dL   GFR calc non Af Amer 39 (L) >60 mL/min   GFR calc Af Amer 45 (L) >60 mL/min  Anion gap 14 5 - 15  Glucose, capillary     Status: Abnormal   Collection Time: 04/11/15 11:26 AM  Result Value Ref Range   Glucose-Capillary 269 (H) 65 - 99 mg/dL       Medication List    STOP taking these medications        acetaminophen 500 MG tablet  Commonly known as:  TYLENOL     hydrALAZINE 25 MG tablet  Commonly known as:  APRESOLINE     isosorbide mononitrate 30 MG 24 hr tablet  Commonly known as:  IMDUR     milrinone 20 MG/100ML Soln infusion  Commonly known as:  PRIMACOR      TAKE these medications        atorvastatin 40 MG tablet  Commonly known as:  LIPITOR  Take 1 tablet (40 mg total) by mouth daily at 6 PM.     bisacodyl 5 MG EC tablet  Commonly known as:  DULCOLAX  Take 5 mg by mouth daily as needed for mild constipation or moderate constipation.     buPROPion 150 MG 12 hr tablet  Commonly known as:  WELLBUTRIN SR  Take 150 mg by mouth 2 (two) times daily.     clopidogrel 75 MG tablet  Commonly known as:  PLAVIX  Take 1 tablet (75 mg total) by mouth daily.     [employer] VITAMIN B12 1000 MCG tablet  Generic drug:  cyanocobalamin  Take 1,000 mcg by mouth daily.     digoxin 0.125 MG tablet  Commonly known as:  LANOXIN  Take 1 tablet (0.125 mg total) by mouth daily.  Start taking on:  04/12/2015     DULoxetine 30 MG capsule  Commonly known as:  CYMBALTA  Take 1 capsule (30 mg total) by mouth daily.     esomeprazole 40 MG capsule  Commonly known as:  NEXIUM  Take 40 mg by mouth daily before  breakfast.     ferrous sulfate 325 (65 FE) MG EC tablet  Take 1 tablet (325 mg total) by mouth daily with breakfast.     insulin aspart 100 UNIT/ML injection  Commonly known as:  novoLOG  Inject 0-15 Units into the skin 3 (three) times daily with meals. CBG 70 - 120: 0 units CBG 121 - 150: 2 units CBG 151 - 200: 3 units CBG 201 - 250: 5 units CBG 251 - 300: 8 units CBG 301 - 350: 11 units CBG 351 - 400: 15 units     insulin glargine 100 UNIT/ML injection  Commonly known as:  LANTUS  Inject 0.2 mLs (20 Units total) into the skin 2 (two) times daily.     lactulose 10 GM/15ML solution  Commonly known as:  CHRONULAC  Place 45 mLs (30 g total) into feeding tube 4 (four) times daily.     lipase/protease/amylase 12000 UNITS Cpep capsule  Commonly known as:  CREON  Take 36,000 Units by mouth 3 (three) times daily before meals.     magnesium oxide 400 (241.3 MG) MG tablet  Commonly known as:  MAG-OX  Take 400 mg by mouth 2 (two) times daily.     PATADAY 0.2 % Soln  Generic drug:  Olopatadine HCl  Apply 1 drop to eye daily as needed. For allergies     rifaximin 550 MG Tabs tablet  Commonly known as:  XIFAXAN  Take 1 tablet (550 mg total) by mouth 2 (two) times daily.     torsemide 20 MG tablet  Commonly known as:  DEMADEX  Take 1 tablet (20 mg total) by mouth 2 (two) times daily. Start the 20 mg twice a day. Start 9/6 at 6 pm     VENTOLIN HFA 108 (90 BASE) MCG/ACT inhaler  Generic drug:  albuterol  Inhale 1-2 puffs into the lungs every 6 (six) hours as needed for wheezing or shortness of breath.         Signed: Dimas Chyle 04/11/2015, 2:07 PM   Agree with above Examined in full May need forther work up [employer], GI consult  Lavon Paganini. Titus Mould, MD, [place] Pgr: [facility]

## 2015-04-12 ENCOUNTER — Other Ambulatory Visit (HOSPITAL_COMMUNITY): Payer: Self-pay

## 2015-04-12 LAB — CBC WITH DIFFERENTIAL/PLATELET
BASOS PCT: 0 %
Basophils Absolute: 0 10*3/uL (ref 0.0–0.1)
EOS ABS: 0 10*3/uL (ref 0.0–0.7)
EOS PCT: 0 %
HEMATOCRIT: 26.2 % — AB (ref 39.0–52.0)
Hemoglobin: 7.9 g/dL — ABNORMAL LOW (ref 13.0–17.0)
LYMPHS ABS: 1.7 10*3/uL (ref 0.7–4.0)
Lymphocytes Relative: 9 %
MCH: 19.9 pg — AB (ref 26.0–34.0)
MCHC: 30.2 g/dL (ref 30.0–36.0)
MCV: 66 fL — AB (ref 78.0–100.0)
MONO ABS: 1.5 10*3/uL — AB (ref 0.1–1.0)
Monocytes Relative: 8 %
NEUTROS ABS: 16 10*3/uL — AB (ref 1.7–7.7)
Neutrophils Relative %: 83 %
Platelets: 202 10*3/uL (ref 150–400)
RBC: 3.97 MIL/uL — ABNORMAL LOW (ref 4.22–5.81)
RDW: 21.2 % — AB (ref 11.5–15.5)
WBC: 19.2 10*3/uL — ABNORMAL HIGH (ref 4.0–10.5)

## 2015-04-12 LAB — COMPREHENSIVE METABOLIC PANEL
ALT: 80 U/L — ABNORMAL HIGH (ref 17–63)
AST: 61 U/L — ABNORMAL HIGH (ref 15–41)
Albumin: 2 g/dL — ABNORMAL LOW (ref 3.5–5.0)
Alkaline Phosphatase: 1572 U/L — ABNORMAL HIGH (ref 38–126)
Anion gap: 14 (ref 5–15)
BILIRUBIN TOTAL: 4.3 mg/dL — AB (ref 0.3–1.2)
BUN: 89 mg/dL — AB (ref 6–20)
CO2: 25 mmol/L (ref 22–32)
CREATININE: 1.71 mg/dL — AB (ref 0.61–1.24)
Calcium: 8.5 mg/dL — ABNORMAL LOW (ref 8.9–10.3)
Chloride: 93 mmol/L — ABNORMAL LOW (ref 101–111)
GFR, EST AFRICAN AMERICAN: 51 mL/min — AB (ref >60)
GFR, EST NON AFRICAN AMERICAN: 44 mL/min — AB (ref >60)
Glucose, Bld: 262 mg/dL — ABNORMAL HIGH (ref 65–99)
POTASSIUM: 3.5 mmol/L (ref 3.5–5.1)
Sodium: 132 mmol/L — ABNORMAL LOW (ref 135–145)
TOTAL PROTEIN: 6.3 g/dL — AB (ref 6.5–8.1)

## 2015-04-12 LAB — AMMONIA: AMMONIA: 49 umol/L — AB (ref 9–35)

## 2015-04-12 LAB — PHOSPHORUS: PHOSPHORUS: 3.4 mg/dL (ref 2.5–4.6)

## 2015-04-12 LAB — PROTIME-INR
INR: 1.55 — ABNORMAL HIGH (ref 0.00–1.49)
Prothrombin Time: 18.6 seconds — ABNORMAL HIGH (ref 11.6–15.2)

## 2015-04-12 LAB — PROCALCITONIN: Procalcitonin: 5.04 ng/mL

## 2015-04-12 LAB — C-REACTIVE PROTEIN: CRP: 15.6 mg/dL — ABNORMAL HIGH (ref <1.0)

## 2015-04-12 LAB — MAGNESIUM: MAGNESIUM: 2.3 mg/dL (ref 1.7–2.4)

## 2015-04-12 LAB — APTT: APTT: 35 s (ref 24–37)

## 2015-04-12 LAB — SEDIMENTATION RATE: SED RATE: 48 mm/h — AB (ref 0–16)

## 2015-04-13 LAB — URINE MICROSCOPIC-ADD ON

## 2015-04-13 LAB — URINALYSIS, ROUTINE W REFLEX MICROSCOPIC
Glucose, UA: NEGATIVE mg/dL
HGB URINE DIPSTICK: NEGATIVE
KETONES UR: 15 mg/dL — AB
NITRITE: POSITIVE — AB
Protein, ur: 30 mg/dL — AB
Specific Gravity, Urine: 1.021 (ref 1.005–1.030)
UROBILINOGEN UA: 4 mg/dL — AB (ref 0.0–1.0)
pH: 5 (ref 5.0–8.0)

## 2015-04-13 LAB — CBC WITH DIFFERENTIAL/PLATELET
Basophils Absolute: 0 10*3/uL (ref 0.0–0.1)
Basophils Relative: 0 %
EOS PCT: 0 %
Eosinophils Absolute: 0 10*3/uL (ref 0.0–0.7)
HCT: 27.4 % — ABNORMAL LOW (ref 39.0–52.0)
Hemoglobin: 8.2 g/dL — ABNORMAL LOW (ref 13.0–17.0)
LYMPHS ABS: 2.1 10*3/uL (ref 0.7–4.0)
Lymphocytes Relative: 9 %
MCH: 19.8 pg — AB (ref 26.0–34.0)
MCHC: 29.9 g/dL — AB (ref 30.0–36.0)
MCV: 66 fL — AB (ref 78.0–100.0)
MONO ABS: 1.2 10*3/uL — AB (ref 0.1–1.0)
Monocytes Relative: 5 %
NEUTROS ABS: 19.9 10*3/uL — AB (ref 1.7–7.7)
Neutrophils Relative %: 86 %
PLATELETS: 201 10*3/uL (ref 150–400)
RBC: 4.15 MIL/uL — AB (ref 4.22–5.81)
RDW: 21.6 % — AB (ref 11.5–15.5)
WBC: 23.2 10*3/uL — AB (ref 4.0–10.5)

## 2015-04-13 LAB — BRAIN NATRIURETIC PEPTIDE: B Natriuretic Peptide: 3193.2 pg/mL — ABNORMAL HIGH (ref 0.0–100.0)

## 2015-04-13 LAB — RENAL FUNCTION PANEL
Albumin: 1.9 g/dL — ABNORMAL LOW (ref 3.5–5.0)
Anion gap: 14 (ref 5–15)
BUN: 104 mg/dL — AB (ref 6–20)
CHLORIDE: 91 mmol/L — AB (ref 101–111)
CO2: 28 mmol/L (ref 22–32)
CREATININE: 2.03 mg/dL — AB (ref 0.61–1.24)
Calcium: 8.7 mg/dL — ABNORMAL LOW (ref 8.9–10.3)
GFR calc Af Amer: 41 mL/min — ABNORMAL LOW (ref >60)
GFR, EST NON AFRICAN AMERICAN: 36 mL/min — AB (ref >60)
GLUCOSE: 205 mg/dL — AB (ref 65–99)
POTASSIUM: 3.9 mmol/L (ref 3.5–5.1)
Phosphorus: 3.4 mg/dL (ref 2.5–4.6)
Sodium: 133 mmol/L — ABNORMAL LOW (ref 135–145)

## 2015-04-13 LAB — C DIFFICILE QUICK SCREEN W PCR REFLEX
C DIFFICILE (CDIFF) TOXIN: NEGATIVE
C DIFFICLE (CDIFF) ANTIGEN: POSITIVE — AB

## 2015-04-13 LAB — DIGOXIN LEVEL: Digoxin Level: 1 ng/mL (ref 0.8–2.0)

## 2015-04-13 LAB — MAGNESIUM: Magnesium: 2.5 mg/dL — ABNORMAL HIGH (ref 1.7–2.4)

## 2015-04-14 LAB — RENAL FUNCTION PANEL
ALBUMIN: 1.7 g/dL — AB (ref 3.5–5.0)
Anion gap: 15 (ref 5–15)
BUN: 125 mg/dL — AB (ref 6–20)
CALCIUM: 8.3 mg/dL — AB (ref 8.9–10.3)
CHLORIDE: 94 mmol/L — AB (ref 101–111)
CO2: 26 mmol/L (ref 22–32)
CREATININE: 2.09 mg/dL — AB (ref 0.61–1.24)
GFR, EST AFRICAN AMERICAN: 40 mL/min — AB (ref >60)
GFR, EST NON AFRICAN AMERICAN: 34 mL/min — AB (ref >60)
Glucose, Bld: 256 mg/dL — ABNORMAL HIGH (ref 65–99)
Phosphorus: 4.5 mg/dL (ref 2.5–4.6)
Potassium: 4 mmol/L (ref 3.5–5.1)
Sodium: 135 mmol/L (ref 135–145)

## 2015-04-14 LAB — HEMOGLOBIN A1C
Hgb A1c MFr Bld: 7.6 % — ABNORMAL HIGH (ref 4.8–5.6)
Mean Plasma Glucose: 171 mg/dL

## 2015-04-14 LAB — CBC WITH DIFFERENTIAL/PLATELET
BASOS PCT: 0 %
Basophils Absolute: 0 10*3/uL (ref 0.0–0.1)
EOS PCT: 0 %
Eosinophils Absolute: 0 10*3/uL (ref 0.0–0.7)
HCT: 26 % — ABNORMAL LOW (ref 39.0–52.0)
HEMOGLOBIN: 7.8 g/dL — AB (ref 13.0–17.0)
LYMPHS PCT: 8 %
Lymphs Abs: 1.9 10*3/uL (ref 0.7–4.0)
MCH: 20.1 pg — AB (ref 26.0–34.0)
MCHC: 30 g/dL (ref 30.0–36.0)
MCV: 66.8 fL — ABNORMAL LOW (ref 78.0–100.0)
MONO ABS: 0.7 10*3/uL (ref 0.1–1.0)
Monocytes Relative: 3 %
NEUTROS PCT: 89 %
Neutro Abs: 21.4 10*3/uL — ABNORMAL HIGH (ref 1.7–7.7)
PLATELETS: 192 10*3/uL (ref 150–400)
RBC: 3.89 MIL/uL — AB (ref 4.22–5.81)
RDW: 22.1 % — ABNORMAL HIGH (ref 11.5–15.5)
WBC: 24 10*3/uL — AB (ref 4.0–10.5)

## 2015-04-14 LAB — URINE CULTURE: CULTURE: NO GROWTH

## 2015-04-14 LAB — MAGNESIUM: MAGNESIUM: 2.5 mg/dL — AB (ref 1.7–2.4)

## 2015-04-15 LAB — RENAL FUNCTION PANEL
Albumin: 1.6 g/dL — ABNORMAL LOW (ref 3.5–5.0)
Anion gap: 11 (ref 5–15)
BUN: 117 mg/dL — AB (ref 6–20)
CALCIUM: 7.5 mg/dL — AB (ref 8.9–10.3)
CHLORIDE: 103 mmol/L (ref 101–111)
CO2: 26 mmol/L (ref 22–32)
CREATININE: 1.88 mg/dL — AB (ref 0.61–1.24)
GFR, EST AFRICAN AMERICAN: 45 mL/min — AB (ref >60)
GFR, EST NON AFRICAN AMERICAN: 39 mL/min — AB (ref >60)
Glucose, Bld: 196 mg/dL — ABNORMAL HIGH (ref 65–99)
Phosphorus: 5.2 mg/dL — ABNORMAL HIGH (ref 2.5–4.6)
Potassium: 3.5 mmol/L (ref 3.5–5.1)
SODIUM: 140 mmol/L (ref 135–145)

## 2015-04-15 LAB — CBC WITH DIFFERENTIAL/PLATELET
BASOS PCT: 0 %
Basophils Absolute: 0 10*3/uL (ref 0.0–0.1)
EOS PCT: 0 %
Eosinophils Absolute: 0 10*3/uL (ref 0.0–0.7)
HCT: 25.1 % — ABNORMAL LOW (ref 39.0–52.0)
HEMOGLOBIN: 7.3 g/dL — AB (ref 13.0–17.0)
LYMPHS PCT: 5 %
Lymphs Abs: 1.2 10*3/uL (ref 0.7–4.0)
MCH: 20 pg — AB (ref 26.0–34.0)
MCHC: 29.1 g/dL — ABNORMAL LOW (ref 30.0–36.0)
MCV: 68.8 fL — AB (ref 78.0–100.0)
MONO ABS: 0.7 10*3/uL (ref 0.1–1.0)
Monocytes Relative: 3 %
NEUTROS PCT: 92 %
Neutro Abs: 21.1 10*3/uL — ABNORMAL HIGH (ref 1.7–7.7)
Platelets: 183 10*3/uL (ref 150–400)
RBC: 3.65 MIL/uL — ABNORMAL LOW (ref 4.22–5.81)
RDW: 23 % — ABNORMAL HIGH (ref 11.5–15.5)
WBC: 23 10*3/uL — ABNORMAL HIGH (ref 4.0–10.5)

## 2015-04-15 LAB — MAGNESIUM: MAGNESIUM: 2.1 mg/dL (ref 1.7–2.4)

## 2015-04-16 LAB — CBC WITH DIFFERENTIAL/PLATELET
BASOS PCT: 0 %
Basophils Absolute: 0 10*3/uL (ref 0.0–0.1)
EOS PCT: 0 %
Eosinophils Absolute: 0 10*3/uL (ref 0.0–0.7)
HCT: 27.3 % — ABNORMAL LOW (ref 39.0–52.0)
HEMOGLOBIN: 7.7 g/dL — AB (ref 13.0–17.0)
LYMPHS PCT: 8 %
Lymphs Abs: 1.5 10*3/uL (ref 0.7–4.0)
MCH: 19.7 pg — AB (ref 26.0–34.0)
MCHC: 28.2 g/dL — ABNORMAL LOW (ref 30.0–36.0)
MCV: 69.8 fL — AB (ref 78.0–100.0)
Monocytes Absolute: 0.6 10*3/uL (ref 0.1–1.0)
Monocytes Relative: 3 %
NEUTROS PCT: 89 %
Neutro Abs: 17.1 10*3/uL — ABNORMAL HIGH (ref 1.7–7.7)
Platelets: 188 10*3/uL (ref 150–400)
RBC: 3.91 MIL/uL — ABNORMAL LOW (ref 4.22–5.81)
RDW: 24.2 % — ABNORMAL HIGH (ref 11.5–15.5)
WBC: 19.2 10*3/uL — ABNORMAL HIGH (ref 4.0–10.5)

## 2015-04-16 LAB — COMPREHENSIVE METABOLIC PANEL
ALK PHOS: 1683 U/L — AB (ref 38–126)
ALT: 122 U/L — AB (ref 17–63)
AST: 169 U/L — ABNORMAL HIGH (ref 15–41)
Albumin: 1.8 g/dL — ABNORMAL LOW (ref 3.5–5.0)
Anion gap: 12 (ref 5–15)
BUN: 126 mg/dL — ABNORMAL HIGH (ref 6–20)
CALCIUM: 8.5 mg/dL — AB (ref 8.9–10.3)
CO2: 30 mmol/L (ref 22–32)
CREATININE: 1.75 mg/dL — AB (ref 0.61–1.24)
Chloride: 105 mmol/L (ref 101–111)
GFR, EST AFRICAN AMERICAN: 50 mL/min — AB (ref >60)
GFR, EST NON AFRICAN AMERICAN: 43 mL/min — AB (ref >60)
Glucose, Bld: 227 mg/dL — ABNORMAL HIGH (ref 65–99)
Potassium: 3.6 mmol/L (ref 3.5–5.1)
Sodium: 147 mmol/L — ABNORMAL HIGH (ref 135–145)
Total Bilirubin: 5.2 mg/dL — ABNORMAL HIGH (ref 0.3–1.2)
Total Protein: 6.4 g/dL — ABNORMAL LOW (ref 6.5–8.1)

## 2015-04-16 LAB — CULTURE, BLOOD (ROUTINE X 2)
CULTURE: NO GROWTH
Culture: NO GROWTH

## 2015-04-16 LAB — MAGNESIUM: MAGNESIUM: 2.4 mg/dL (ref 1.7–2.4)

## 2015-04-16 LAB — PHOSPHORUS: PHOSPHORUS: 5.3 mg/dL — AB (ref 2.5–4.6)

## 2015-04-16 LAB — AMMONIA: Ammonia: 44 umol/L — ABNORMAL HIGH (ref 9–35)

## 2015-04-17 LAB — CBC WITH DIFFERENTIAL/PLATELET
BASOS PCT: 0 %
Basophils Absolute: 0 10*3/uL (ref 0.0–0.1)
EOS PCT: 0 %
Eosinophils Absolute: 0 10*3/uL (ref 0.0–0.7)
HCT: 27.2 % — ABNORMAL LOW (ref 39.0–52.0)
Hemoglobin: 7.6 g/dL — ABNORMAL LOW (ref 13.0–17.0)
LYMPHS ABS: 1.6 10*3/uL (ref 0.7–4.0)
Lymphocytes Relative: 8 %
MCH: 19.9 pg — AB (ref 26.0–34.0)
MCHC: 27.9 g/dL — AB (ref 30.0–36.0)
MCV: 71.2 fL — AB (ref 78.0–100.0)
MONO ABS: 0.6 10*3/uL (ref 0.1–1.0)
Monocytes Relative: 3 %
Neutro Abs: 17.6 10*3/uL — ABNORMAL HIGH (ref 1.7–7.7)
Neutrophils Relative %: 89 %
PLATELETS: 171 10*3/uL (ref 150–400)
RBC: 3.82 MIL/uL — ABNORMAL LOW (ref 4.22–5.81)
RDW: 25.5 % — AB (ref 11.5–15.5)
WBC: 19.8 10*3/uL — AB (ref 4.0–10.5)

## 2015-04-17 LAB — BASIC METABOLIC PANEL
Anion gap: 12 (ref 5–15)
BUN: 122 mg/dL — AB (ref 6–20)
CALCIUM: 8.4 mg/dL — AB (ref 8.9–10.3)
CO2: 33 mmol/L — ABNORMAL HIGH (ref 22–32)
Chloride: 106 mmol/L (ref 101–111)
Creatinine, Ser: 1.77 mg/dL — ABNORMAL HIGH (ref 0.61–1.24)
GFR calc Af Amer: 48 mL/min — ABNORMAL LOW (ref >60)
GFR, EST NON AFRICAN AMERICAN: 42 mL/min — AB (ref >60)
GLUCOSE: 221 mg/dL — AB (ref 65–99)
Potassium: 3.3 mmol/L — ABNORMAL LOW (ref 3.5–5.1)
Sodium: 151 mmol/L — ABNORMAL HIGH (ref 135–145)

## 2015-04-18 ENCOUNTER — Institutional Professional Consult (permissible substitution) (HOSPITAL_COMMUNITY): Payer: Self-pay

## 2015-04-18 ENCOUNTER — Other Ambulatory Visit (HOSPITAL_COMMUNITY): Payer: Medicare Other

## 2015-04-18 LAB — CBC WITH DIFFERENTIAL/PLATELET
BASOS PCT: 0 %
Basophils Absolute: 0 10*3/uL (ref 0.0–0.1)
EOS PCT: 0 %
Eosinophils Absolute: 0 10*3/uL (ref 0.0–0.7)
HEMATOCRIT: 26.8 % — AB (ref 39.0–52.0)
Hemoglobin: 7.4 g/dL — ABNORMAL LOW (ref 13.0–17.0)
LYMPHS ABS: 1.7 10*3/uL (ref 0.7–4.0)
Lymphocytes Relative: 10 %
MCH: 20.1 pg — ABNORMAL LOW (ref 26.0–34.0)
MCHC: 27.6 g/dL — AB (ref 30.0–36.0)
MCV: 72.6 fL — AB (ref 78.0–100.0)
MONOS PCT: 5 %
Monocytes Absolute: 0.9 10*3/uL (ref 0.1–1.0)
NEUTROS ABS: 14.5 10*3/uL — AB (ref 1.7–7.7)
Neutrophils Relative %: 85 %
Platelets: 168 10*3/uL (ref 150–400)
RBC: 3.69 MIL/uL — ABNORMAL LOW (ref 4.22–5.81)
RDW: 26.4 % — AB (ref 11.5–15.5)
WBC: 17.1 10*3/uL — ABNORMAL HIGH (ref 4.0–10.5)

## 2015-04-18 LAB — CULTURE, BLOOD (ROUTINE X 2)
CULTURE: NO GROWTH
CULTURE: NO GROWTH

## 2015-04-18 LAB — RENAL FUNCTION PANEL
Albumin: 1.8 g/dL — ABNORMAL LOW (ref 3.5–5.0)
Anion gap: 12 (ref 5–15)
BUN: 106 mg/dL — ABNORMAL HIGH (ref 6–20)
CHLORIDE: 108 mmol/L (ref 101–111)
CO2: 32 mmol/L (ref 22–32)
CREATININE: 1.85 mg/dL — AB (ref 0.61–1.24)
Calcium: 8.1 mg/dL — ABNORMAL LOW (ref 8.9–10.3)
GFR calc non Af Amer: 40 mL/min — ABNORMAL LOW (ref >60)
GFR, EST AFRICAN AMERICAN: 46 mL/min — AB (ref >60)
Glucose, Bld: 285 mg/dL — ABNORMAL HIGH (ref 65–99)
Phosphorus: 4.6 mg/dL (ref 2.5–4.6)
Potassium: 3.6 mmol/L (ref 3.5–5.1)
Sodium: 152 mmol/L — ABNORMAL HIGH (ref 135–145)

## 2015-04-18 LAB — MAGNESIUM: MAGNESIUM: 2.2 mg/dL (ref 1.7–2.4)

## 2015-04-19 LAB — CBC
HCT: 28 % — ABNORMAL LOW (ref 39.0–52.0)
Hemoglobin: 7.7 g/dL — ABNORMAL LOW (ref 13.0–17.0)
MCH: 20 pg — ABNORMAL LOW (ref 26.0–34.0)
MCHC: 27.5 g/dL — ABNORMAL LOW (ref 30.0–36.0)
MCV: 72.7 fL — AB (ref 78.0–100.0)
PLATELETS: 159 10*3/uL (ref 150–400)
RBC: 3.85 MIL/uL — AB (ref 4.22–5.81)
RDW: 27.4 % — ABNORMAL HIGH (ref 11.5–15.5)
WBC: 15.1 10*3/uL — AB (ref 4.0–10.5)

## 2015-04-19 LAB — MAGNESIUM: MAGNESIUM: 2 mg/dL (ref 1.7–2.4)

## 2015-04-19 LAB — BASIC METABOLIC PANEL
ANION GAP: 12 (ref 5–15)
BUN: 89 mg/dL — ABNORMAL HIGH (ref 6–20)
CO2: 35 mmol/L — ABNORMAL HIGH (ref 22–32)
Calcium: 8.3 mg/dL — ABNORMAL LOW (ref 8.9–10.3)
Chloride: 107 mmol/L (ref 101–111)
Creatinine, Ser: 1.34 mg/dL — ABNORMAL HIGH (ref 0.61–1.24)
GFR calc Af Amer: >60 mL/min (ref >60)
GFR, EST NON AFRICAN AMERICAN: 59 mL/min — AB (ref >60)
Glucose, Bld: 168 mg/dL — ABNORMAL HIGH (ref 65–99)
POTASSIUM: 2.8 mmol/L — AB (ref 3.5–5.1)
SODIUM: 154 mmol/L — AB (ref 135–145)

## 2015-04-19 LAB — PHOSPHORUS: Phosphorus: 3.7 mg/dL (ref 2.5–4.6)

## 2015-04-20 ENCOUNTER — Emergency Department (HOSPITAL_COMMUNITY): Payer: Medicare Other

## 2015-04-20 ENCOUNTER — Inpatient Hospital Stay (HOSPITAL_COMMUNITY): Payer: Medicare Other

## 2015-04-20 ENCOUNTER — Inpatient Hospital Stay (HOSPITAL_COMMUNITY)
Admission: EM | Admit: 2015-04-20 | Discharge: 2015-04-24 | DRG: 919 | Disposition: A | Discharge status: Other Acute Inpatient Hospital | Payer: Medicare Other | Attending: Internal Medicine | Admitting: Internal Medicine

## 2015-04-20 ENCOUNTER — Encounter (HOSPITAL_COMMUNITY): Payer: Self-pay | Admitting: *Deleted

## 2015-04-20 DIAGNOSIS — G629 Polyneuropathy, unspecified: Secondary | ICD-10-CM | POA: Diagnosis present

## 2015-04-20 DIAGNOSIS — T85528A Displacement of other gastrointestinal prosthetic devices, implants and grafts, initial encounter: Principal | ICD-10-CM | POA: Diagnosis present

## 2015-04-20 DIAGNOSIS — Z8249 Family history of ischemic heart disease and other diseases of the circulatory system: Secondary | ICD-10-CM | POA: Diagnosis not present

## 2015-04-20 DIAGNOSIS — L89154 Pressure ulcer of sacral region, stage 4: Secondary | ICD-10-CM | POA: Diagnosis present

## 2015-04-20 DIAGNOSIS — Y731 Therapeutic (nonsurgical) and rehabilitative gastroenterology and urology devices associated with adverse incidents: Secondary | ICD-10-CM | POA: Diagnosis present

## 2015-04-20 DIAGNOSIS — K7210 Chronic hepatic failure without coma: Secondary | ICD-10-CM | POA: Diagnosis present

## 2015-04-20 DIAGNOSIS — E1165 Type 2 diabetes mellitus with hyperglycemia: Secondary | ICD-10-CM | POA: Diagnosis present

## 2015-04-20 DIAGNOSIS — I5022 Chronic systolic (congestive) heart failure: Secondary | ICD-10-CM | POA: Diagnosis not present

## 2015-04-20 DIAGNOSIS — I272 Other secondary pulmonary hypertension: Secondary | ICD-10-CM | POA: Diagnosis present

## 2015-04-20 DIAGNOSIS — Z0189 Encounter for other specified special examinations: Secondary | ICD-10-CM

## 2015-04-20 DIAGNOSIS — Z833 Family history of diabetes mellitus: Secondary | ICD-10-CM | POA: Diagnosis not present

## 2015-04-20 DIAGNOSIS — E11649 Type 2 diabetes mellitus with hypoglycemia without coma: Secondary | ICD-10-CM | POA: Diagnosis present

## 2015-04-20 DIAGNOSIS — K922 Gastrointestinal hemorrhage, unspecified: Secondary | ICD-10-CM | POA: Diagnosis present

## 2015-04-20 DIAGNOSIS — T85598A Other mechanical complication of other gastrointestinal prosthetic devices, implants and grafts, initial encounter: Secondary | ICD-10-CM | POA: Insufficient documentation

## 2015-04-20 DIAGNOSIS — E1121 Type 2 diabetes mellitus with diabetic nephropathy: Secondary | ICD-10-CM | POA: Diagnosis present

## 2015-04-20 DIAGNOSIS — K2210 Ulcer of esophagus without bleeding: Secondary | ICD-10-CM | POA: Insufficient documentation

## 2015-04-20 DIAGNOSIS — D62 Acute posthemorrhagic anemia: Secondary | ICD-10-CM | POA: Insufficient documentation

## 2015-04-20 DIAGNOSIS — E7800 Pure hypercholesterolemia, unspecified: Secondary | ICD-10-CM | POA: Diagnosis present

## 2015-04-20 DIAGNOSIS — Z87891 Personal history of nicotine dependence: Secondary | ICD-10-CM

## 2015-04-20 DIAGNOSIS — K920 Hematemesis: Secondary | ICD-10-CM | POA: Insufficient documentation

## 2015-04-20 DIAGNOSIS — E1122 Type 2 diabetes mellitus with diabetic chronic kidney disease: Secondary | ICD-10-CM | POA: Diagnosis present

## 2015-04-20 DIAGNOSIS — E870 Hyperosmolality and hypernatremia: Secondary | ICD-10-CM | POA: Diagnosis present

## 2015-04-20 DIAGNOSIS — N183 Chronic kidney disease, stage 3 unspecified: Secondary | ICD-10-CM | POA: Diagnosis present

## 2015-04-20 DIAGNOSIS — I5023 Acute on chronic systolic (congestive) heart failure: Secondary | ICD-10-CM | POA: Diagnosis present

## 2015-04-20 DIAGNOSIS — K219 Gastro-esophageal reflux disease without esophagitis: Secondary | ICD-10-CM | POA: Diagnosis present

## 2015-04-20 DIAGNOSIS — Z978 Presence of other specified devices: Secondary | ICD-10-CM

## 2015-04-20 DIAGNOSIS — IMO0002 Reserved for concepts with insufficient information to code with codable children: Secondary | ICD-10-CM | POA: Diagnosis present

## 2015-04-20 DIAGNOSIS — T85598D Other mechanical complication of other gastrointestinal prosthetic devices, implants and grafts, subsequent encounter: Secondary | ICD-10-CM | POA: Diagnosis not present

## 2015-04-20 DIAGNOSIS — E118 Type 2 diabetes mellitus with unspecified complications: Secondary | ICD-10-CM

## 2015-04-20 DIAGNOSIS — G9340 Encephalopathy, unspecified: Secondary | ICD-10-CM | POA: Insufficient documentation

## 2015-04-20 HISTORY — DX: Essential (primary) hypertension: I10

## 2015-04-20 HISTORY — DX: Acute kidney failure, unspecified: N17.9

## 2015-04-20 HISTORY — DX: Unspecified cirrhosis of liver: K74.60

## 2015-04-20 HISTORY — DX: Anemia, unspecified: D64.9

## 2015-04-20 HISTORY — DX: Peripheral vascular disease, unspecified: I73.9

## 2015-04-20 HISTORY — DX: Dysphagia, unspecified: R13.10

## 2015-04-20 HISTORY — DX: Hematemesis: K92.0

## 2015-04-20 HISTORY — DX: Candidal esophagitis: B37.81

## 2015-04-20 HISTORY — DX: Foot drop, right foot: M21.371

## 2015-04-20 HISTORY — DX: Foot drop, left foot: M21.372

## 2015-04-20 HISTORY — DX: Other chronic pancreatitis: K86.1

## 2015-04-20 LAB — CBC WITH DIFFERENTIAL/PLATELET
BASOS PCT: 0 %
Basophils Absolute: 0 10*3/uL (ref 0.0–0.1)
EOS ABS: 0.1 10*3/uL (ref 0.0–0.7)
EOS PCT: 1 %
HEMATOCRIT: 30.8 % — AB (ref 39.0–52.0)
Hemoglobin: 9.7 g/dL — ABNORMAL LOW (ref 13.0–17.0)
LYMPHS PCT: 13 %
Lymphs Abs: 1.9 10*3/uL (ref 0.7–4.0)
MCH: 24.2 pg — AB (ref 26.0–34.0)
MCHC: 31.5 g/dL (ref 30.0–36.0)
MCV: 76.8 fL — AB (ref 78.0–100.0)
Monocytes Absolute: 0.7 10*3/uL (ref 0.1–1.0)
Monocytes Relative: 5 %
Neutro Abs: 11.6 10*3/uL — ABNORMAL HIGH (ref 1.7–7.7)
Neutrophils Relative %: 81 %
PLATELETS: 108 10*3/uL — AB (ref 150–400)
RBC: 4.01 MIL/uL — AB (ref 4.22–5.81)
RDW: 24.4 % — AB (ref 11.5–15.5)
WBC: 14.3 10*3/uL — AB (ref 4.0–10.5)

## 2015-04-20 LAB — COMPREHENSIVE METABOLIC PANEL
ALBUMIN: 1.5 g/dL — AB (ref 3.5–5.0)
ALT: 80 U/L — ABNORMAL HIGH (ref 17–63)
ALT: 84 U/L — ABNORMAL HIGH (ref 17–63)
ANION GAP: 8 (ref 5–15)
ANION GAP: 9 (ref 5–15)
AST: 95 U/L — AB (ref 15–41)
AST: 105 U/L — AB (ref 15–41)
Albumin: 1.7 g/dL — ABNORMAL LOW (ref 3.5–5.0)
Alkaline Phosphatase: 911 U/L — ABNORMAL HIGH (ref 38–126)
Alkaline Phosphatase: 941 U/L — ABNORMAL HIGH (ref 38–126)
BILIRUBIN TOTAL: 3.4 mg/dL — AB (ref 0.3–1.2)
BUN: 94 mg/dL — AB (ref 6–20)
BUN: 102 mg/dL — AB (ref 6–20)
CHLORIDE: 107 mmol/L (ref 101–111)
CHLORIDE: 110 mmol/L (ref 101–111)
CO2: 31 mmol/L (ref 22–32)
CO2: 33 mmol/L — ABNORMAL HIGH (ref 22–32)
Calcium: 7.1 mg/dL — ABNORMAL LOW (ref 8.9–10.3)
Calcium: 8.1 mg/dL — ABNORMAL LOW (ref 8.9–10.3)
Creatinine, Ser: 1.48 mg/dL — ABNORMAL HIGH (ref 0.61–1.24)
Creatinine, Ser: 1.63 mg/dL — ABNORMAL HIGH (ref 0.61–1.24)
GFR calc Af Amer: >60 mL/min (ref >60)
GFR calc non Af Amer: 52 mL/min — ABNORMAL LOW (ref >60)
GFR, EST AFRICAN AMERICAN: 54 mL/min — AB (ref >60)
GFR, EST NON AFRICAN AMERICAN: 46 mL/min — AB (ref >60)
GLUCOSE: 200 mg/dL — AB (ref 65–99)
Glucose, Bld: 140 mg/dL — ABNORMAL HIGH (ref 65–99)
POTASSIUM: 3.5 mmol/L (ref 3.5–5.1)
POTASSIUM: 3.6 mmol/L (ref 3.5–5.1)
SODIUM: 146 mmol/L — AB (ref 135–145)
Sodium: 152 mmol/L — ABNORMAL HIGH (ref 135–145)
TOTAL PROTEIN: 5.6 g/dL — AB (ref 6.5–8.1)
Total Bilirubin: 3.3 mg/dL — ABNORMAL HIGH (ref 0.3–1.2)
Total Protein: 5.2 g/dL — ABNORMAL LOW (ref 6.5–8.1)

## 2015-04-20 LAB — I-STAT CHEM 8, ED
BUN: 91 mg/dL — AB (ref 6–20)
CREATININE: 1.5 mg/dL — AB (ref 0.61–1.24)
Calcium, Ion: 1.1 mmol/L — ABNORMAL LOW (ref 1.12–1.23)
Chloride: 106 mmol/L (ref 101–111)
GLUCOSE: 136 mg/dL — AB (ref 65–99)
HEMATOCRIT: 35 % — AB (ref 39.0–52.0)
Hemoglobin: 11.9 g/dL — ABNORMAL LOW (ref 13.0–17.0)
POTASSIUM: 3.7 mmol/L (ref 3.5–5.1)
Sodium: 153 mmol/L — ABNORMAL HIGH (ref 135–145)
TCO2: 29 mmol/L (ref 0–100)

## 2015-04-20 LAB — URINALYSIS, ROUTINE W REFLEX MICROSCOPIC
GLUCOSE, UA: NEGATIVE mg/dL
HGB URINE DIPSTICK: NEGATIVE
Ketones, ur: NEGATIVE mg/dL
Nitrite: NEGATIVE
PROTEIN: NEGATIVE mg/dL
Specific Gravity, Urine: 1.017 (ref 1.005–1.030)
UROBILINOGEN UA: 1 mg/dL (ref 0.0–1.0)
pH: 5 (ref 5.0–8.0)

## 2015-04-20 LAB — PROTIME-INR
INR: 1.6 — AB (ref 0.00–1.49)
PROTHROMBIN TIME: 19.1 s — AB (ref 11.6–15.2)

## 2015-04-20 LAB — C-REACTIVE PROTEIN: CRP: 2.7 mg/dL — ABNORMAL HIGH (ref <1.0)

## 2015-04-20 LAB — URINE MICROSCOPIC-ADD ON

## 2015-04-20 LAB — HEMOGLOBIN AND HEMATOCRIT, BLOOD
HCT: 20.2 % — ABNORMAL LOW (ref 39.0–52.0)
HEMATOCRIT: 29.3 % — AB (ref 39.0–52.0)
HEMOGLOBIN: 5.5 g/dL — AB (ref 13.0–17.0)
HEMOGLOBIN: 9 g/dL — AB (ref 13.0–17.0)

## 2015-04-20 LAB — PREPARE RBC (CROSSMATCH)

## 2015-04-20 LAB — POC OCCULT BLOOD, ED: Fecal Occult Bld: POSITIVE — AB

## 2015-04-20 LAB — AMMONIA: Ammonia: 44 umol/L — ABNORMAL HIGH (ref 9–35)

## 2015-04-20 LAB — I-STAT CG4 LACTIC ACID, ED: LACTIC ACID, VENOUS: 1.51 mmol/L (ref 0.5–2.0)

## 2015-04-20 LAB — SEDIMENTATION RATE: SED RATE: 16 mm/h (ref 0–16)

## 2015-04-20 MED ORDER — BUPROPION HCL ER (SR) 150 MG PO TB12
150.0000 mg | ORAL_TABLET | Freq: Two times a day (BID) | ORAL | Status: DC
  Filled 2015-04-20 (×5): qty 1

## 2015-04-20 MED ORDER — ATORVASTATIN CALCIUM 40 MG PO TABS
40.0000 mg | ORAL_TABLET | Freq: Every day | ORAL | Status: DC
  Filled 2015-04-20 (×2): qty 1

## 2015-04-20 MED ORDER — ONDANSETRON HCL 4 MG PO TABS
4.0000 mg | ORAL_TABLET | Freq: Four times a day (QID) | ORAL | Status: DC | PRN
Start: 2015-04-20 — End: 2015-04-24

## 2015-04-20 MED ORDER — ONDANSETRON HCL 4 MG/2ML IJ SOLN: 4.0000 mg | Freq: Four times a day (QID) | INTRAMUSCULAR | Status: DC | PRN

## 2015-04-20 MED ORDER — PANTOPRAZOLE SODIUM 40 MG IV SOLR
40.0000 mg | Freq: Two times a day (BID) | INTRAVENOUS | Status: DC
  Administered 2015-04-21 – 2015-04-23 (×7): 40 mg via INTRAVENOUS
  Filled 2015-04-20 (×9): qty 40

## 2015-04-20 MED ORDER — DIGOXIN 125 MCG PO TABS
0.1250 mg | ORAL_TABLET | Freq: Every day | ORAL | Status: DC
  Filled 2015-04-20 (×2): qty 1

## 2015-04-20 MED ORDER — DULOXETINE HCL 30 MG PO CPEP
30.0000 mg | ORAL_CAPSULE | Freq: Every day | ORAL | Status: DC
  Filled 2015-04-20 (×2): qty 1

## 2015-04-20 MED ORDER — PANTOPRAZOLE SODIUM 40 MG IV SOLR
40.0000 mg | Freq: Once | INTRAVENOUS | Status: AC
  Administered 2015-04-20: 40 mg via INTRAVENOUS
  Filled 2015-04-20: qty 40

## 2015-04-20 MED ORDER — SODIUM CHLORIDE 0.9 % IJ SOLN
3.0000 mL | Freq: Two times a day (BID) | INTRAMUSCULAR | Status: DC
  Administered 2015-04-20 – 2015-04-23 (×7): 3 mL via INTRAVENOUS
  Administered 2015-04-24: 11:00:00 via INTRAVENOUS

## 2015-04-20 MED ORDER — SODIUM CHLORIDE 0.45 % IV SOLN
INTRAVENOUS | Status: DC
  Administered 2015-04-20 – 2015-04-21 (×2): via INTRAVENOUS

## 2015-04-20 MED ORDER — RIFAXIMIN 550 MG PO TABS
550.0000 mg | ORAL_TABLET | Freq: Two times a day (BID) | ORAL | Status: DC
  Administered 2015-04-23 – 2015-04-24 (×2): 550 mg via ORAL
  Filled 2015-04-20 (×9): qty 1

## 2015-04-20 MED ORDER — CEFTRIAXONE SODIUM IN DEXTROSE 40 MG/ML IV SOLN
2.0000 g | INTRAVENOUS | Status: DC
  Administered 2015-04-21: 2 g via INTRAVENOUS
  Filled 2015-04-20: qty 50

## 2015-04-20 NOTE — ED Notes (Signed)
[facility] and was reported that he was vomitting blood and Hgl was 5.5.  Pt was given 3 units of blood.  Pt appears confused at times refusing to follow simple commands.  Wife is at bedside.  Pt in specialty bed.

## 2015-04-20 NOTE — ED Provider Notes (Signed)
CSN: [id number]     Arrival date & time 04/20/15  1744 History   First MD Initiated Contact with Patient 04/20/15 1749     Chief Complaint  Patient presents with  . Hematemesis     (Consider location/radiation/quality/duration/timing/severity/associated sxs/prior Treatment) HPI Comments: [facility]em[facility] that onset last night. Last episode was at 3 AM. He was given 2 units of blood for a hemoglobin of 5.5 and hypotension. Plavix was held and patient started on PPI. He was seen by GI who recommended admission for EGD as [facility] was not able to perform this and [facility]. Patient was heavy drinker with history of possible varices. He is alert and oriented 2. His answers are slow his mentation is slow. He denies any pain. He has a rectal tube in NG tube in place. [facility] October 5.  .Record review patient with a history of end-stage congestive heart failure no longer on milrinone drip due to noncompliance and futility of care. Patient completed a course of Rocephin for Escherichia coli bacteremia and aspiration pneumonia.  He is C dif antigen positive but antibody negative.  On PO vanco and IV flagyl.  The history is provided by the patient, the EMS personnel and a caregiver. The history is limited by the condition of the patient.    Past Medical History  Diagnosis Date  . Diabetes mellitus   . Atrophy of calf muscles   . Depression   . GERD (gastroesophageal reflux disease)   . Peripheral neuropathy (HCC)   . Hypercholesteremia   . Hx of tracheostomy (HCC)   . Chronic systolic CHF (congestive heart failure), NYHA class 3 (HCC)    Past Surgical History  Procedure Laterality Date  . Pancreas surgery  Nov 11, 1999    hx pancreatitis  . Anterior cervical decomp/discectomy fusion  04/13/2012    Procedure: ANTERIOR CERVICAL DECOMPRESSION/DISCECTOMY FUSION 1 LEVEL;  Surgeon: Temple Pacini, MD;  Location: [facility] NEURO ORS;  Service: Neurosurgery;  Laterality: N/A;  Cervcial Five-Six Anterior Cervical Decompression and Fusion with Allograft and Plating  . Cardiac catheterization N/A 11/15/2014    Procedure: Right Heart Cath;  Surgeon: Dolores Patty, MD;  Location: [facility] INVASIVE CV LAB;  Service: Cardiovascular;  Laterality: N/A;  . Peripheral vascular catheterization N/A 02/26/2015    Procedure: Abdominal Aortogram;  Surgeon: Chuck Hint, MD;  Location: [facility] INVASIVE CV LAB;  Service: Cardiovascular;  Laterality: N/A;   Family History  Problem Relation Age of Onset  . Diabetes Mellitus II Sister   . CAD Brother   . Cancer Mother     ? type   . Cancer Father     ? type   Social History  Substance Use Topics  . Smoking status: Current Every Day Smoker -- 1.00 packs/day for 30 years    Types: Cigarettes  . Smokeless tobacco: Never Used  . Alcohol Use: No     Comment: Quit after his brother's death in 2005/11/10 or 8     Review of Systems  Unable to perform ROS: Mental status change      Allergies  Review of patient's allergies indicates no known allergies.  Home Medications   Prior to Admission medications   Medication Sig Start Date End Date Taking? Authorizing Provider  atorvastatin (LIPITOR) 40 MG tablet Take 1 tablet (40 mg total) by mouth daily at 6 PM. 03/01/15  Yes Amy Georgie Chard, NP  buPROPion (WELLBUTRIN SR) 150 MG 12 hr tablet Take 150 mg by mouth 2 (two) times daily. 01/29/15  Yes Historical Provider, MD  clopidogrel (PLAVIX) 75 MG tablet Take 1 tablet (75 mg total) by mouth daily. 03/01/15  Yes Amy D Clegg, NP  [facility] VITAMIN B12 1000 MCG tablet Take 1,000 mcg by mouth daily. 10/15/14  Yes Historical Provider, MD  digoxin (LANOXIN) 0.125 MG tablet Take 1 tablet (0.125 mg total) by mouth daily. 04/12/15  Yes Ardith Dark, MD  DULoxetine (CYMBALTA) 30 MG capsule Take 1 capsule (30 mg total) by mouth daily. 02/04/15  Yes Drema Dallas, MD  famotidine (PEPCID)  20 MG tablet Take 20 mg by mouth daily.   Yes Historical Provider, MD  ferrous sulfate 325 (65 FE) MG EC tablet Take 1 tablet (325 mg total) by mouth daily with breakfast. 03/08/15  Yes Amy D Clegg, NP  magnesium oxide (MAG-OX) 400 (241.3 MG) MG tablet Take 400 mg by mouth 2 (two) times daily. 01/26/15  Yes Historical Provider, MD  rifaximin (XIFAXAN) 550 MG TABS tablet Take 1 tablet (550 mg total) by mouth 2 (two) times daily. 04/11/15  Yes Ardith Dark, MD  torsemide (DEMADEX) 20 MG tablet Take 1 tablet (20 mg total) by mouth 2 (two) times daily. Start the 20 mg twice a day. Start 9/6 at 6 pm 03/13/15  Yes Amy D Clegg, NP  VENTOLIN HFA 108 (90 BASE) MCG/ACT inhaler Inhale 1-2 puffs into the lungs every 6 (six) hours as needed for wheezing or shortness of breath.  02/15/15  Yes Historical Provider, MD   BP 109/69 mmHg  Pulse 92  Temp(Src) 98 F (36.7 C) (Oral)  Resp 13  Ht  (1.676 m)  Wt 177 lb 4 oz (80.4 kg)  BMI 28.62 kg/m2  SpO2 100% Physical Exam  Constitutional: No distress.  Chronically ill appearing.  HENT:  Head: Normocephalic and atraumatic.  Mouth/Throat: Oropharynx is clear and moist.  Dry mucus membranes  Eyes: Conjunctivae and EOM are normal. Pupils are equal, round, and reactive to light.  Neck: Normal range of motion. Neck supple.  Cardiovascular: Normal rate and normal heart sounds.   tachycardic  Pulmonary/Chest: Effort normal and breath sounds normal. No respiratory distress. He exhibits no tenderness.  Abdominal: Soft. There is tenderness. There is no rebound and no guarding.  Diffusely tender  Genitourinary:  Rectal tube in place with black stool  Musculoskeletal:  Diffuse atrophy  Neurological:  Oriented x2, sleepy but arousable, moving all extremities  Skin: Skin is warm. He is not diaphoretic.    ED Course  Procedures (including critical care time) Labs Review Labs Reviewed  CBC WITH DIFFERENTIAL/PLATELET - Abnormal; Notable for the following:     WBC 14.3 (*)    RBC 4.01 (*)    Hemoglobin 9.7 (*)    HCT 30.8 (*)    MCV 76.8 (*)    MCH 24.2 (*)    RDW 24.4 (*)    Platelets 108 (*)    Neutro Abs 11.6 (*)    All other components within normal limits  COMPREHENSIVE METABOLIC PANEL - Abnormal; Notable for the following:    Sodium 152 (*)    CO2 33 (*)    Glucose, Bld 140 (*)    BUN 102 (*)    Creatinine, Ser 1.63 (*)    Calcium 8.1 (*)    Total Protein 5.6 (*)    Albumin 1.7 (*)    AST 105 (*)  ALT 84 (*)    Alkaline Phosphatase 941 (*)    Total Bilirubin 3.4 (*)    GFR calc non Af Amer 46 (*)    GFR calc Af Amer 54 (*)    All other components within normal limits  PROTIME-INR - Abnormal; Notable for the following:    Prothrombin Time 19.1 (*)    INR 1.60 (*)    All other components within normal limits  URINALYSIS, ROUTINE W REFLEX MICROSCOPIC (NOT AT [facility]) - Abnormal; Notable for the following:    Color, Urine ORANGE (*)    APPearance CLOUDY (*)    Bilirubin Urine SMALL (*)    Leukocytes, UA SMALL (*)    All other components within normal limits  AMMONIA - Abnormal; Notable for the following:    Ammonia 44 (*)    All other components within normal limits  URINE MICROSCOPIC-ADD ON - Abnormal; Notable for the following:    Casts HYALINE CASTS (*)    All other components within normal limits  COMPREHENSIVE METABOLIC PANEL - Abnormal; Notable for the following:    Sodium 152 (*)    Chloride 112 (*)    Glucose, Bld 58 (*)    BUN 98 (*)    Creatinine, Ser 1.75 (*)    Calcium 8.3 (*)    Albumin 1.8 (*)    AST 98 (*)    ALT 81 (*)    Alkaline Phosphatase 935 (*)    Total Bilirubin 3.5 (*)    GFR calc non Af Amer 42 (*)    GFR calc Af Amer 49 (*)    All other components within normal limits  CBC - Abnormal; Notable for the following:    WBC 16.6 (*)    RBC 4.06 (*)    Hemoglobin 9.9 (*)    HCT 31.4 (*)    MCV 77.3 (*)    MCH 24.4 (*)    RDW 25.2 (*)    Platelets 110 (*)    All other components within  normal limits  PROTIME-INR - Abnormal; Notable for the following:    Prothrombin Time 19.3 (*)    INR 1.62 (*)    All other components within normal limits  BASIC METABOLIC PANEL - Abnormal; Notable for the following:    Sodium 151 (*)    CO2 33 (*)    BUN 99 (*)    Creatinine, Ser 1.52 (*)    Calcium 8.1 (*)    GFR calc non Af Amer 50 (*)    GFR calc Af Amer 58 (*)    All other components within normal limits  POC OCCULT BLOOD, ED - Abnormal; Notable for the following:    Fecal Occult Bld POSITIVE (*)    All other components within normal limits  I-STAT CHEM 8, ED - Abnormal; Notable for the following:    Sodium 153 (*)    BUN 91 (*)    Creatinine, Ser 1.50 (*)    Glucose, Bld 136 (*)    Calcium, Ion 1.10 (*)    Hemoglobin 11.9 (*)    HCT 35.0 (*)    All other components within normal limits  I-STAT CG4 LACTIC ACID, ED    Imaging Review Dg Chest Portable 1 View  04/20/2015  CLINICAL DATA:  GI bleed. EXAM: PORTABLE CHEST 1 VIEW COMPARISON:  04/12/2015 and prior exams FINDINGS: Cardiomegaly, right PICC line with tip overlying the upper SVC and small bore feeding tube identified. The feeding tube tip appears to overlie  the distal esophagus. Mild right lower lung opacity may represent atelectasis or airspace disease/pneumonia. There is no evidence of pneumothorax, definite pleural effusion or pulmonary edema. Degenerative changes in the left shoulder noted. IMPRESSION: Mild right lower lung opacity -question atelectasis versus airspace disease/ pneumonia. Support apparatus as described. Small bore feeding tube tip appears to overlie the lower esophagus. Cardiomegaly. Electronically Signed   By: Harmon Pier M.D.   On: 04/20/2015 18:23   Dg Abd Portable 1v  04/20/2015  CLINICAL DATA:  NG tube placement EXAM: PORTABLE ABDOMEN - 1 VIEW COMPARISON:  04/18/2015 FINDINGS: There is a feeding tube looped within the fundus with the metallic tip directed cranially at the gastroesophageal  junction. Recommend repositioning prior to use. There is no bowel dilatation to suggest obstruction. There is no evidence of pneumoperitoneum, portal venous gas or pneumatosis. There are no pathologic calcifications along the expected course of the ureters. The osseous structures are unremarkable. IMPRESSION: There is a feeding tube looped within the fundus with the metallic tip directed cranially at the gastroesophageal junction. Recommend repositioning prior to use. Electronically Signed   By: Elige Ko   On: 04/20/2015 23:48   I have personally reviewed and evaluated these images and lab results as part of my medical decision-making.   EKG Interpretation   Date/Time:  Friday April 20 2015 17:48:17 EDT Ventricular Rate:  88 PR Interval:  183 QRS Duration: 87 QT Interval:  430 QTC Calculation: 520 R Axis:   -35 Text Interpretation:  Sinus rhythm LAE, consider biatrial enlargement  Inferior infarct, old Anterior infarct, old Lateral leads are also  involved Prolonged QT interval Nonspecific ST and T wave abnormality  Confirmed by Manus Gunning  MD, Hermila Millis [phone]) on 04/20/2015 6:19:35 PM      MDM   Final diagnoses:  Encounter for imaging study to confirm nasogastric (NG) tube placement   [facility] and GI bleeding since last night. He hemodynamically stable on arrival. Dark stool in rectal bag.  Hemoglobin 9.6 which is improved after transfusion this morning. Labs show hypernatremia. With stable Cr and LFT elevation.  Continue PPI. No IVF in setting of end stage CHF. No indication for further transfusion at this time.  Discussed in person with GI Dr. Lavon Paganini.  She advised admission to hospitalist for EGD in the morning. D/w Dr. Maryfrances Bunnell.  Vitals remains stable in the ED.  Per reports wife still not interested in palliative care discussion and patient remains full code.  Glynn Octave, MD 04/21/15 1020

## 2015-04-20 NOTE — Consult Note (Addendum)
Attending physician's note   I have taken a history, examined the patient in the ER and reviewed the chart. I agree with the Advanced Practitioner's note, impression and recommendations.  51 yr M with end stage CHF, cirrhosis had a episode of hematemesis with drop in Hgb to 5.5 s/p transfusion of 3U of Prbc with appropriate response. On exam patient is alert, not oriented or verbal Abdomen is soft NTND, BS+ Rectal tube with liquid black stool   Will plan EGD tomorrow AM for evaluation of possible upper GI source of bleed PPI twice daily He is hypernatremic with Na 153, DC lactulose and continue Rifaximin DC flagyl and PO vancomycin (C.diff toxin negative)  K Scherry Ran, MD [phone] Mon-Fri 8a-5p [phone] after 5p, weekends, holidays

## 2015-04-20 NOTE — Consult Note (Signed)
[facility] Consult: 2:12 PM 04/20/2015     Referring Provider: Dr Laren Everts.   Primary Care Physician:  Philis Fendt, MD Primary Gastroenterologist:  Althia Forts.     Reason for Consultation: vomiting blood and acute worsening of anemia.    HPI: Christian Wall is a 54 y.o. male.  Has CHF. Hx IDDM, chronic calcific pancreatitis with alcohol abuse, diabetes with peripheral neuropathy, GERD, hypercholesterolemia, depression, degenerative disc disease status post cervical fusion.  CHF.  On chronic Plavix.  SNF resident before recent admission and discharge to [facility] last week.  Was on Milrinone drip but PICC access for the med became dislodged.  11/2013 EGD for dilated distal esophagus on CT and epigastric pain.  Dr Hilarie Fredrickson on unassigned call.  Candida esophagitis.  3 cm HH.  Gastric mucosal edema biopsied (path: candida esophagitis, chronic inactive gastritis)  9/26 - 10/5 inpt admission to [facility] with acute, hypoxic resp failure, HCAP right LL,  AKI and recently dislodged PICC requiring discontinuation of Milrinone.    Had RLL PNA, grew E coli from blood.  Encephalopathy investigated with MRI :  No stroke, + encephalomacia. Lactulose and Rifaximin added due to ? Cirrhosis and the encephalopathy, ammonia level in 40s. EF stable at 15 to 20%.  Milrinone restarted, dobutamins initiated but both ultimately discontinued due to treatment failure and Digoxin started. Ultrasound abdomen 04/03/15 showed lobulated liver, normal live echogenicity, antegrade portal venous flow, small perihepatic ascites, "correlate for cirrhosis risk factors".  04/09/15 ultrasound: "Stable changes of cirrhosis of the liver with associated ascites."  Hep A, B, C serologies all negative.  LFTs continue to be elevated with last T bil 5.2, alk phos  1683, AST/ALT 169/122 on 10/10. 04/10/15 SLP bedside swallow eval showed overt s/s of aspiration and rec was to keep pt NPO.  He is on tube feeds per panda feeding tube.   Discharged to [facility] for ongoing antibiotics.  Continued on Plavix, PPI (Nexium is his "home " PPI) was on discharge med list but pt has only been getting oral Pepcid, oral iron.  Wife refused pall care involvement but per Dr Haroldine Laws and other MD's pt's prognosis poor.     Last night vomited red blood.  Hgb dropped from baseline of 7.7 yesterday to 5.5 this AM.  Blood pressures to 80s/50s.  Got 2 units blood today, repeat coags pending. Coags 10 days ago with pt/inr was 18.6/1.5.  Plavix discontinued, po pepcid discontinued and IV, BID Protonix started today. Has flexiseal in place and this contains dark, non-bloody, watery diarrhea,  No nausea or abdominal pain though pt not verbal at present.  C diff Ag + and C diff toxin negative on 04/13/15.  However MD is treating "empirically" with oral vanco and IV flagyl for diarrhea, but still receiving Lactulose 45 gm per day.      Past Medical History  Diagnosis Date  . Diabetes mellitus   . Atrophy of calf muscles   . Depression   . GERD (gastroesophageal reflux disease)   . Peripheral neuropathy   . Hypercholesteremia   .  Hx of tracheostomy   . Chronic systolic CHF (congestive heart failure), NYHA class 3     Past Surgical History  Procedure Laterality Date  . Pancreas surgery  2001    hx pancreatitis  . Anterior cervical decomp/discectomy fusion  04/13/2012    Procedure: ANTERIOR CERVICAL DECOMPRESSION/DISCECTOMY FUSION 1 LEVEL;  Surgeon: Charlie Pitter, MD;  Location: [facility];  Service: Neurosurgery;  Laterality: N/A;  Cervcial Five-Six Anterior Cervical Decompression and Fusion with Allograft and Plating  . Cardiac catheterization N/A 11/15/2014    Procedure: Right Heart Cath;  Surgeon: Jolaine Artist, MD;  Location: [facility];  Service: Cardiovascular;   Laterality: N/A;  . Peripheral vascular catheterization N/A 02/26/2015    Procedure: Abdominal Aortogram;  Surgeon: Angelia Mould, MD;  Location: [facility];  Service: Cardiovascular;  Laterality: N/A;    Prior to Admission medications   Medication Sig Start Date End Date Taking? Authorizing Provider  atorvastatin (LIPITOR) 40 MG tablet Take 1 tablet (40 mg total) by mouth daily at 6 PM. 03/01/15   Amy D Clegg, NP  bisacodyl (DULCOLAX) 5 MG EC tablet Take 5 mg by mouth daily as needed for mild constipation or moderate constipation.    Historical Provider, MD  buPROPion (WELLBUTRIN SR) 150 MG 12 hr tablet Take 150 mg by mouth 2 (two) times daily. 01/29/15   Historical Provider, MD  clopidogrel (PLAVIX) 75 MG tablet Take 1 tablet (75 mg total) by mouth daily. 03/01/15   Amy Estrella Deeds, NP  [employer]S VITAMIN B12 1000 MCG tablet Take 1,000 mcg by mouth daily. 10/15/14   Historical Provider, MD  digoxin (LANOXIN) 0.125 MG tablet Take 1 tablet (0.125 mg total) by mouth daily. 04/12/15   Vivi Barrack, MD  DULoxetine (CYMBALTA) 30 MG capsule Take 1 capsule (30 mg total) by mouth daily. 02/04/15   Allie Bossier, MD  esomeprazole (NEXIUM) 40 MG capsule Take 40 mg by mouth daily before breakfast.    Historical Provider, MD  ferrous sulfate 325 (65 FE) MG EC tablet Take 1 tablet (325 mg total) by mouth daily with breakfast. 03/08/15   Amy D Clegg, NP  insulin aspart (NOVOLOG) 100 UNIT/ML injection Inject 0-15 Units into the skin 3 (three) times daily with meals. CBG 70 - 120: 0 units CBG 121 - 150: 2 units CBG 151 - 200: 3 units CBG 201 - 250: 5 units CBG 251 - 300: 8 units CBG 301 - 350: 11 units CBG 351 - 400: 15 units 11/13/13   Maryann Mikhail, DO  insulin glargine (LANTUS) 100 UNIT/ML injection Inject 0.2 mLs (20 Units total) into the skin 2 (two) times daily. 03/13/15   Amy D Clegg, NP  lactulose (CHRONULAC) 10 GM/15ML solution Place 45 mLs (30 g total) into feeding tube 4 (four) times daily. 04/11/15    Vivi Barrack, MD  lipase/protease/amylase (CREON-10/PANCREASE) 12000 UNITS CPEP Take 36,000 Units by mouth 3 (three) times daily before meals.     Historical Provider, MD  magnesium oxide (MAG-OX) 400 (241.3 MG) MG tablet Take 400 mg by mouth 2 (two) times daily. 01/26/15   Historical Provider, MD  Olopatadine HCl (PATADAY) 0.2 % SOLN Apply 1 drop to eye daily as needed. For allergies    Historical Provider, MD  rifaximin (XIFAXAN) 550 MG TABS tablet Take 1 tablet (550 mg total) by mouth 2 (two) times daily. 04/11/15   Vivi Barrack, MD  torsemide (DEMADEX) 20 MG tablet Take 1 tablet (  20 mg total) by mouth 2 (two) times daily. Start the 20 mg twice a day. Start 9/6 at 6 pm 03/13/15   Amy D Clegg, NP  VENTOLIN HFA 108 (90 BASE) MCG/ACT inhaler Inhale 1-2 puffs into the lungs every 6 (six) hours as needed for wheezing or shortness of breath.  02/15/15   Historical Provider, MD    Current meds Bumex,  Metronidazole IV Atorvastatin Biotene oral rinse Bupropion Rocephin Digoxin Ferrous Sulfate po Lactulose Lantus insulin Vitamin b12 xifaxan zenpep pancrease supplement.     Allergies as of 04/11/2015  . (No Known Allergies)    Family History  Problem Relation Age of Onset  . Diabetes Mellitus II Sister   . CAD Brother   . Cancer Mother     ? type   . Cancer Father     ? type    Social History   Social History  . Marital Status: Married    Spouse Name: N/A  . Number of Children: N/A  . Years of Education: N/A   Occupational History  . disabled    Social History Main Topics  . Smoking status: Current Every Day Smoker -- 1.00 packs/day for 30 years    Types: Cigarettes  . Smokeless tobacco: Never Used  . Alcohol Use: No     Comment: Quit after his brother's death in 09/18/05 or 8   . Drug Use: No  . Sexual Activity: Yes   Other Topics Concern  . Not on file   Social History Narrative   Lives in [place] with wife, uses crutches or wheelchair to get around     REVIEW OF SYSTEMS: Constitutional:  Bed ridden ENT:  No nose bleeds Pulm:  No new acute resp events.   CV:  No palpitations, no LE edema.  GU:  No hematuria, no frequency GI:  Per HPI Heme:  Per HPI   Transfusions:  No records of previous transfusions in epic Neuro:  No headaches, no peripheral tingling or numbness Derm:  No itching, no rash or sores.  Endocrine:  No sweats or chills.  No polyuria or dysuria Immunization:  reviewed Travel:  None   PHYSICAL EXAM: Wt Readings from Last 3 Encounters:  04/11/15 173 lb 8 oz (78.7 kg)  03/29/15 173 lb 15.1 oz (78.9 kg)  03/13/15 187 lb 6.3 oz (85 kg)   bp 93/71 Pulse 84 resp 17 Temp 97.  General: ill looking, pale AAM.  Eyes rolling upwards, not following commands or speaking Head:  No signs of trauma  Eyes:  + icterus, eyes not focusing, rolling up to top of head Ears:  Not able to assess hearing  Nose:  No discharge,  Fine gauge NGT in place. Mouth:  No bleood in mouth Neck:  No mass.  Lungs:  Rales in bases, no dyspnea at rest, no cough Heart: RRR, s1, s2 audible Abdomen:  Soft, NT.  Active BS.  Fullness in RUQ.   Rectal: flexiseal in place: watery, dark brown, not bloody stool.  No digital exam performed   Musc/Skeltl: no joint erythema or swelling.  Muscle atrophy of legs Extremities:  1+ pedal edema, feet are cold and discolored.  Wearing booties.   Neurologic:  Not speaking or following commands.   Skin:  Dry, shrivelled  Intake/Output from previous day:   Intake/Output this shift:    LAB RESULTS:  Recent Labs  04/18/15 0630 04/19/15 0645 04/20/15 0141  WBC 17.1* 15.1*  --   HGB 7.4* 7.7* 5.5*  HCT 26.8* 28.0* 20.2*  PLT 168 159  --    BMET Lab Results  Component Value Date   NA 154* 04/19/2015   NA 152* 04/18/2015   NA 151* 04/17/2015   K 2.8* 04/19/2015   K 3.6 04/18/2015   K 3.3* 04/17/2015   CL 107 04/19/2015   CL 108 04/18/2015   CL 106 04/17/2015   CO2 35* 04/19/2015   CO2 32  04/18/2015   CO2 33* 04/17/2015   GLUCOSE 168* 04/19/2015   GLUCOSE 285* 04/18/2015   GLUCOSE 221* 04/17/2015   BUN 89* 04/19/2015   BUN 106* 04/18/2015   BUN 122* 04/17/2015   CREATININE 1.34* 04/19/2015   CREATININE 1.85* 04/18/2015   CREATININE 1.77* 04/17/2015   CALCIUM 8.3* 04/19/2015   CALCIUM 8.1* 04/18/2015   CALCIUM 8.4* 04/17/2015   LFT  Recent Labs  04/18/15 0630  ALBUMIN 1.8*   PT/INR Lab Results  Component Value Date   INR 1.55* 04/12/2015   INR 1.42 04/05/2015   INR 1.49 04/03/2015   Hepatitis Panel No results for input(s): HEPBSAG, HCVAB, HEPAIGM, HEPBIGM in the last 72 hours. C-Diff No components found for: CDIFF Lipase     Component Value Date/Time   LIPASE 15* 04/05/2015 1245    Drugs of Abuse     Component Value Date/Time   LABOPIA NONE DETECTED 03/21/2015 1833   COCAINSCRNUR NONE DETECTED 03/21/2015 1833   LABBENZ NONE DETECTED 03/21/2015 1833   AMPHETMU NONE DETECTED 03/21/2015 1833   THCU NONE DETECTED 03/21/2015 1833   LABBARB NONE DETECTED 03/21/2015 1833     RADIOLOGY STUDIES: Dg Abd Portable 1v  04/18/2015  CLINICAL DATA:  Check feeding catheter placement EXAM: PORTABLE ABDOMEN - 1 VIEW COMPARISON:  None. FINDINGS: Scattered large and small bowel gas is noted. A feeding catheter is noted coiled within the stomach. No free air is seen. IMPRESSION: Feeding catheter coiled within the stomach. Electronically Signed   By: Inez Catalina M.D.   On: 04/18/2015 16:39    ENDOSCOPIC STUDIES: Per HPI.   IMPRESSION:   *  Upper GI bleed, single episode.  Hematemesis last night, no visible blood in stool.   Rule out MW tear, rule out ulcer, rule out portal gastropathy, rule out varices. No evidence portal gastropathy or varices on EGD in 11/2013.   *  Acute blood loss on top of chronic anemia. S/p 2 prbcs today.  Awaiting repeat Hgb.   * End stage CHF.  Poor prognosis, wife refuses palliative care and pt is full code. BPs soft.   *   New dx  cirrhosis as of last month in pt with longstanding hx ETOH abuse, negative hepatitis.   serologies.  + ascites, + coagulopathy, no thrombocytopenia.   *  Watery diarrhea.  C diff toxin neg, Ag + on empiric IV flagyl and po vanco.  *  Dysphagia.  On tube feedings.  SLP study last week as per HPI.    *  RLL PNA,  Facility acquired.  Likely aspirated .  On Rocephin.   *  AMS, encephalopathy.  On empiric lactulose and rifaximin for ? Hepatic encephalopathy but ammonia level in 30s to 40s, suspect AMS is multifactorial.  CT and MRI + for encephalomacia. negative for CVA.      PLAN:     *  Per Dr Silverio Decamp.   *  Consider stopping the vanco and flagyl and lactulose, do not see clear indication for these and despite the HE regimen of rifaximin and  lactulose he remains obtunded.   *  Hold plavix and continue the BID, IV Protonix.  Stop tube feeds.    Azucena Freed  04/20/2015, 2:12 PM Pager: [phone]

## 2015-04-20 NOTE — ED Notes (Signed)
[facility] paged for [facility] @ [phone]

## 2015-04-21 ENCOUNTER — Encounter (HOSPITAL_COMMUNITY)
Admission: EM | Disposition: A | Discharge status: Other Acute Inpatient Hospital | Payer: Self-pay | Source: Home / Self Care | Attending: Internal Medicine

## 2015-04-21 DIAGNOSIS — T85598A Other mechanical complication of other gastrointestinal prosthetic devices, implants and grafts, initial encounter: Secondary | ICD-10-CM | POA: Insufficient documentation

## 2015-04-21 DIAGNOSIS — K920 Hematemesis: Secondary | ICD-10-CM | POA: Insufficient documentation

## 2015-04-21 DIAGNOSIS — N183 Chronic kidney disease, stage 3 (moderate): Secondary | ICD-10-CM

## 2015-04-21 DIAGNOSIS — E0821 Diabetes mellitus due to underlying condition with diabetic nephropathy: Secondary | ICD-10-CM

## 2015-04-21 DIAGNOSIS — E1122 Type 2 diabetes mellitus with diabetic chronic kidney disease: Secondary | ICD-10-CM

## 2015-04-21 DIAGNOSIS — I5023 Acute on chronic systolic (congestive) heart failure: Secondary | ICD-10-CM

## 2015-04-21 DIAGNOSIS — Z794 Long term (current) use of insulin: Secondary | ICD-10-CM

## 2015-04-21 DIAGNOSIS — G6289 Other specified polyneuropathies: Secondary | ICD-10-CM

## 2015-04-21 DIAGNOSIS — I272 Other secondary pulmonary hypertension: Secondary | ICD-10-CM

## 2015-04-21 HISTORY — PX: ESOPHAGOGASTRODUODENOSCOPY (EGD) WITH PROPOFOL: SHX5813

## 2015-04-21 LAB — GLUCOSE, CAPILLARY
GLUCOSE-CAPILLARY: 91 mg/dL (ref 65–99)
GLUCOSE-CAPILLARY: 99 mg/dL (ref 65–99)
Glucose-Capillary: 34 mg/dL — CL (ref 65–99)
Glucose-Capillary: 65 mg/dL (ref 65–99)

## 2015-04-21 LAB — CBC
HEMATOCRIT: 31.4 % — AB (ref 39.0–52.0)
HEMATOCRIT: 30.5 % — AB (ref 39.0–52.0)
Hemoglobin: 9.9 g/dL — ABNORMAL LOW (ref 13.0–17.0)
Hemoglobin: 9.3 g/dL — ABNORMAL LOW (ref 13.0–17.0)
MCH: 24.4 pg — ABNORMAL LOW (ref 26.0–34.0)
MCH: 24 pg — ABNORMAL LOW (ref 26.0–34.0)
MCHC: 31.5 g/dL (ref 30.0–36.0)
MCHC: 30.5 g/dL (ref 30.0–36.0)
MCV: 77.3 fL — AB (ref 78.0–100.0)
MCV: 78.6 fL (ref 78.0–100.0)
PLATELETS: 110 10*3/uL — AB (ref 150–400)
Platelets: 107 10*3/uL — ABNORMAL LOW (ref 150–400)
RBC: 4.06 MIL/uL — ABNORMAL LOW (ref 4.22–5.81)
RBC: 3.88 MIL/uL — ABNORMAL LOW (ref 4.22–5.81)
RDW: 25.2 % — AB (ref 11.5–15.5)
RDW: 26.8 % — AB (ref 11.5–15.5)
WBC: 16.6 10*3/uL — ABNORMAL HIGH (ref 4.0–10.5)
WBC: 14.5 10*3/uL — ABNORMAL HIGH (ref 4.0–10.5)

## 2015-04-21 LAB — COMPREHENSIVE METABOLIC PANEL
ALT: 81 U/L — ABNORMAL HIGH (ref 17–63)
AST: 98 U/L — ABNORMAL HIGH (ref 15–41)
Albumin: 1.8 g/dL — ABNORMAL LOW (ref 3.5–5.0)
Alkaline Phosphatase: 935 U/L — ABNORMAL HIGH (ref 38–126)
Anion gap: 8 (ref 5–15)
BILIRUBIN TOTAL: 3.5 mg/dL — AB (ref 0.3–1.2)
BUN: 98 mg/dL — AB (ref 6–20)
CO2: 32 mmol/L (ref 22–32)
CREATININE: 1.75 mg/dL — AB (ref 0.61–1.24)
Calcium: 8.3 mg/dL — ABNORMAL LOW (ref 8.9–10.3)
Chloride: 112 mmol/L — ABNORMAL HIGH (ref 101–111)
GFR, EST AFRICAN AMERICAN: 49 mL/min — AB (ref >60)
GFR, EST NON AFRICAN AMERICAN: 42 mL/min — AB (ref >60)
GLUCOSE: 58 mg/dL — AB (ref 65–99)
POTASSIUM: 3.7 mmol/L (ref 3.5–5.1)
Sodium: 152 mmol/L — ABNORMAL HIGH (ref 135–145)
TOTAL PROTEIN: 6.5 g/dL (ref 6.5–8.1)

## 2015-04-21 LAB — BASIC METABOLIC PANEL
Anion gap: 7 (ref 5–15)
BUN: 99 mg/dL — AB (ref 6–20)
CHLORIDE: 111 mmol/L (ref 101–111)
CO2: 33 mmol/L — ABNORMAL HIGH (ref 22–32)
Calcium: 8.1 mg/dL — ABNORMAL LOW (ref 8.9–10.3)
Creatinine, Ser: 1.52 mg/dL — ABNORMAL HIGH (ref 0.61–1.24)
GFR calc Af Amer: 58 mL/min — ABNORMAL LOW (ref >60)
GFR calc non Af Amer: 50 mL/min — ABNORMAL LOW (ref >60)
GLUCOSE: 77 mg/dL (ref 65–99)
POTASSIUM: 3.6 mmol/L (ref 3.5–5.1)
Sodium: 151 mmol/L — ABNORMAL HIGH (ref 135–145)

## 2015-04-21 LAB — TYPE AND SCREEN
ABO/RH(D): O POS
ANTIBODY SCREEN: NEGATIVE
UNIT DIVISION: 0
Unit division: 0
Unit division: 0

## 2015-04-21 LAB — PROTIME-INR
INR: 1.62 — AB (ref 0.00–1.49)
Prothrombin Time: 19.3 seconds — ABNORMAL HIGH (ref 11.6–15.2)

## 2015-04-21 SURGERY — ESOPHAGOGASTRODUODENOSCOPY (EGD) WITH PROPOFOL
Anesthesia: Moderate Sedation

## 2015-04-21 MED ORDER — MORPHINE SULFATE (PF) 2 MG/ML IV SOLN
1.0000 mg | INTRAVENOUS | Status: DC | PRN
  Administered 2015-04-21 – 2015-04-24 (×8): 1 mg via INTRAVENOUS
  Filled 2015-04-21 (×9): qty 1

## 2015-04-21 MED ORDER — CETYLPYRIDINIUM CHLORIDE 0.05 % MT LIQD
7.0000 mL | Freq: Two times a day (BID) | OROMUCOSAL | Status: DC
  Administered 2015-04-21 – 2015-04-23 (×4): 7 mL via OROMUCOSAL

## 2015-04-21 MED ORDER — DIPHENHYDRAMINE HCL 50 MG/ML IJ SOLN
INTRAMUSCULAR | Status: AC
  Filled 2015-04-21: qty 1

## 2015-04-21 MED ORDER — FENTANYL CITRATE (PF) 100 MCG/2ML IJ SOLN
INTRAMUSCULAR | Status: DC | PRN
  Administered 2015-04-21 (×2): 12.5 ug via INTRAVENOUS

## 2015-04-21 MED ORDER — DEXTROSE 50 % IV SOLN
INTRAVENOUS | Status: AC
  Administered 2015-04-21 (×2): 25 mL
  Filled 2015-04-21: qty 50

## 2015-04-21 MED ORDER — MIDAZOLAM HCL 5 MG/ML IJ SOLN
INTRAMUSCULAR | Status: AC
  Filled 2015-04-21: qty 2

## 2015-04-21 MED ORDER — FENTANYL CITRATE (PF) 100 MCG/2ML IJ SOLN
INTRAMUSCULAR | Status: AC
  Filled 2015-04-21: qty 2

## 2015-04-21 MED ORDER — MIDAZOLAM HCL 10 MG/2ML IJ SOLN
INTRAMUSCULAR | Status: DC | PRN
  Administered 2015-04-21 (×3): .5 mg via INTRAVENOUS

## 2015-04-21 MED ORDER — CHLORHEXIDINE GLUCONATE 0.12 % MT SOLN
15.0000 mL | Freq: Two times a day (BID) | OROMUCOSAL | Status: DC
  Administered 2015-04-21 – 2015-04-24 (×6): 15 mL via OROMUCOSAL
  Filled 2015-04-21 (×8): qty 15

## 2015-04-21 MED ORDER — DEXTROSE-NACL 5-0.45 % IV SOLN
INTRAVENOUS | Status: DC
  Administered 2015-04-21 – 2015-04-22 (×2): via INTRAVENOUS
  Administered 2015-04-23: 50 mL/h via INTRAVENOUS

## 2015-04-21 NOTE — H&P (Signed)
History and Physical  Christian Wall  GEX:[id number]  DOB: [birth year]  DOA: 04/20/2015  Referring physician: Glynn Octave, MD PCP: Dorrene German, MD   Chief Complaint: Hematemesis  HPI: Christian Wall is a 54 y.o. male with a past medical history significant for end-stage chronic systolic CHF EF 20-25%, encephalopathy, leg atrophy, DM 2, CKD 3, PVD with femoral stents who presents with vomiting blood.  [facility] after his last hospitalization for hypoxic respiratory failure and septic shock until Friday morning at 3 AM when he reportedly vomited blood. His blood pressure dropped into the 80s over 50s, and he received 2 units PRBC, and the patient was transferred to [facility] for endoscopy.  In the ED, the patient's blood pressure returned to the 100s systolic.  He was encephalopathic, he had hypernatremia, stable liver failure, and his Hgb rebounded to 9.0 g/dL after transfusion.  Review of the patient's chart shows that he has a very unfortunate history of chronic leg atrophy from prolonged hospitalization for pancreatitis years ago, but developed acutely this past May new systolic heart failure with EF 20-25%, presumed NICM followed by Dr. Clarise Cruz, admitted 8 times this summer, was on home inotropes for a time, but this was stopped in the last 3 weeks because of no central access or treatment failure, who has had several months of chronic hepatitis (presumably related to CHF) and now in the last few weeks with unexplained encephalopathy.  During his last hospitalization palliative care was involved, but the patient's wife prefers not to meet with them.      Review of Systems:  Patient seen 8:30 AM on 04/20/2015. Not reliable due to patient mentation, but denies pain, dyspnea, nausea.  Past Medical History  Diagnosis Date  . Diabetes mellitus   . Atrophy of calf muscles   . Depression   . GERD (gastroesophageal reflux  disease)   . Peripheral neuropathy (HCC)   . Hypercholesteremia   . Hx of tracheostomy (HCC)   . Chronic systolic CHF (congestive heart failure), NYHA class 3 (HCC)   The above past medical history was reviewed.  Past Surgical History  Procedure Laterality Date  . Pancreas surgery  2001    hx pancreatitis  . Anterior cervical decomp/discectomy fusion  04/13/2012    Procedure: ANTERIOR CERVICAL DECOMPRESSION/DISCECTOMY FUSION 1 LEVEL;  Surgeon: Temple Pacini, MD;  Location: [facility];  Service: Neurosurgery;  Laterality: N/A;  Cervcial Five-Six Anterior Cervical Decompression and Fusion with Allograft and Plating  . Cardiac catheterization N/A 11/15/2014    Procedure: Right Heart Cath;  Surgeon: Dolores Patty, MD;  [facility];  Service: Cardiovascular;  Laterality: N/A;  . Peripheral vascular catheterization N/A 02/26/2015    Procedure: Abdominal Aortogram;  Surgeon: Chuck Hint, MD;  [facility];  Service: Cardiovascular;  Laterality: N/A;  The above surgical history was reviewed.  Social History: Patient lives at [facility].  He is a former smoker.  He does not walk due to leg atrophy.    No Known Allergies  Family History  Problem Relation Age of Onset  . Diabetes Mellitus II Sister   . CAD Brother   . Cancer Mother     ? type   . Cancer Father     ? type    Prior to Admission medications   Medication Sig Start Date End Date Taking? Authorizing Provider  atorvastatin (LIPITOR) 40 MG tablet Take 1  tablet (40 mg total) by mouth daily at 6 PM. 03/01/15  Yes Amy D Clegg, NP  buPROPion (WELLBUTRIN SR) 150 MG 12 hr tablet Take 150 mg by mouth 2 (two) times daily. 01/29/15  Yes Historical Provider, MD  clopidogrel (PLAVIX) 75 MG tablet Take 1 tablet (75 mg total) by mouth daily. 03/01/15  Yes Amy D Clegg, NP  CVS VITAMIN B12 1000 MCG tablet Take 1,000 mcg by mouth daily. 10/15/14  Yes Historical Provider, MD  digoxin (LANOXIN) 0.125  MG tablet Take 1 tablet (0.125 mg total) by mouth daily. 04/12/15  Yes Ardith Dark, MD  DULoxetine (CYMBALTA) 30 MG capsule Take 1 capsule (30 mg total) by mouth daily. 02/04/15  Yes Drema Dallas, MD  famotidine (PEPCID) 20 MG tablet Take 20 mg by mouth daily.   Yes Historical Provider, MD  ferrous sulfate 325 (65 FE) MG EC tablet Take 1 tablet (325 mg total) by mouth daily with breakfast. 03/08/15  Yes Amy D Clegg, NP  magnesium oxide (MAG-OX) 400 (241.3 MG) MG tablet Take 400 mg by mouth 2 (two) times daily. 01/26/15  Yes Historical Provider, MD  rifaximin (XIFAXAN) 550 MG TABS tablet Take 1 tablet (550 mg total) by mouth 2 (two) times daily. 04/11/15  Yes Ardith Dark, MD  torsemide (DEMADEX) 20 MG tablet Take 1 tablet (20 mg total) by mouth 2 (two) times daily. Start the 20 mg twice a day. Start 9/6 at 6 pm 03/13/15  Yes Amy D Clegg, NP  VENTOLIN HFA 108 (90 BASE) MCG/ACT inhaler Inhale 1-2 puffs into the lungs every 6 (six) hours as needed for wheezing or shortness of breath.  02/15/15  Yes Historical Provider, MD    Physical Exam: BP 102/71 mmHg  Pulse 94  Temp(Src) 97.9 F (36.6 C) (Axillary)  Resp 17  Ht  (1.676 m)  Wt 80.5 kg (177 lb 7.5 oz)  BMI 28.66 kg/m2  SpO2 97% General appearance: Adult male, confused.  Eyes open but not able to answer questions intelligibly.  NG tube in place.     Eyes: Scleral icterus, mild.  No discharge.  EOMI appear intact.   Nose: Crusting around NG, no active epistaxis.    Skin: Warm and dry.   Cardiac: Tachycardic, regular, I do not appreciate murmurs.  No JVD.   Respiratory: CTAB without rales or wheezes. Abdomen: BS present and active.  Abdomen soft without rigidity.  Minimal TTP on my exam.   Neuro: Confused.  Spontaneously says, "they would like you", when I ask who, he says "the baby, the baby would like it."  Diminished muscle bulk diffusely.  Moves slowly, but appears to move all extremities.    Psych: Slowed, confused.       Labs on  Admission:  The metabolic panel is notable for hypernatremia, chronic kidney disease. The AST and ALT are 95 and 105 U/L.  the bilirubin is 3.4 mg/dL and is stable The white blood cell count is 14.3 K per UL and down from previously. The hemoglobin is 11.9 g/dL up from 5.5 g/dL earlier today. The lactic acid level is normal.  The ammonia is 44      Radiological Exams on Admission: Personally reviewed: Dg Chest Portable 1 View 04/20/2015 IMPRESSION: Mild right lower lung opacity -question atelectasis versus airspace disease/ pneumonia. Support apparatus as described. Small bore feeding tube tip appears to overlie the lower esophagus. Cardiomegaly.      Dg Abd Portable 1v 04/20/2015   Tip doubled  back over the GEJ.    EKG: Independently reviewed. Sinus tachycardia, QTC is 520 ms.    Assessment/Plan  1. UGIB and blood loss anemia:  -Admit to stepdown for close hemodynamic monitoring -Repeat hemoglobin, and transfuse as needed for hemoglobin less than 7 -Protonix twice daily -Gastroenterology is involved, appreciate their recommendations -NG tube currently misplaced and unusable, will defer repositioning until source of bleeding can be visualized.   2. End-stage systolic CHF:  This was first diagnosed in May of this year.  The abruptness of this of this diagnosis and the change in the patient's status over the summer. Difficult for the wife to deal with. -Resume digoxin when NG tube is in place -Consult to cardiology for goals of care discussion  3. Hypernatremia:  -Half-normal saline at 75 mL per hour -Trend BMP   4. Encephalopathy:  There appeared to be several contributing factors. -Continue rifaximin for hyperammonemia  5. Liver failure:  Unchanged.  6. Leukocytosis:  Unchanged  7. DM:  Not currently on insulin.     DVT PPx: SCDs Diet: Nothing by mouth Consultants: Gastroenterology, cardiology Code Status: Full Family Communication: The patient's  diagnosis, differential, expected workup, and treatments were discussed with his wife Chip Boer. All her questions were answered. CODE STATUS was clarified. Chip Boer understands that the patient's prognosis is guarded, but she appears emtionally overwhelmed with end-of-life care.   Disposition Plan:  At the time of admission, it appears that the appropriate admission status for this patient is INPATIENT. This is judged to be reasonable and necessary in order to provide the required intensity of service to ensure the patient's safety given the presenting symptoms, physical exam findings, and initial radiographic and laboratory data in the context of their chronic comorbidities.  Together, these circumstances are felt to place her/him at high risk for further clinical deterioration threatening life, limb, or organ.    Alberteen Sam [facility] Pager [phone]

## 2015-04-21 NOTE — Progress Notes (Signed)
Pt's blood sugar was 34, gave pt 55ml of D50, rechecked and it was 91. Paged Dr Sharon Seller and got order to change IV fluids to D5 1/2.  Pt awake and alert, pt still slightly confused. Marisue Ivan RN

## 2015-04-21 NOTE — Op Note (Signed)
[facility] [address] [place], [address]   ENDOSCOPY PROCEDURE REPORT  PATIENT: Christian, Wall  MR#: [id number] BIRTHDATE: [birth year] , 54  yrs. old GENDER: male ENDOSCOPIST: Christian Aris, MD REFERRED BY: PROCEDURE DATE:  04/21/2015 PROCEDURE:  EGD, diagnostic ASA CLASS:     Class IV INDICATIONS:  hematemesis. MEDICATIONS: Fentanyl 25 mcg IV and Versed 1.5 mg IV TOPICAL ANESTHETIC: none  DESCRIPTION OF PROCEDURE: After the risks benefits and alternatives of the procedure were thoroughly explained, informed consent was obtained.  The Pentax Gastroscope [id number] endoscope was introduced through the mouth and advanced to the second portion of the duodenum , Without limitations.  The instrument was slowly withdrawn as the mucosa was fully examined.   Extensive ulceration in the esophagus extending from 30 cm to GE junction at 40cm, tip of the feeding tube was coiled in the mid esophagus.  No active bleeding.  The feeding tube was pushed into the stomach.  Gastric mucosa appeared normal.  Duodenum appeared normal.  Retroflexed views revealed no abnormalities.     The scope was then withdrawn from the patient and the procedure completed. Images could not be captured to dysfunction of the cable.  COMPLICATIONS: There were no immediate complications.  ENDOSCOPIC IMPRESSION: Extensive ulceration in the esophagus extending from 30 cm to GE junction at 40cm, tip of the feeding tube was coiled in the mid esophagus.  No active bleeding was noted  RECOMMENDATIONS: Feeding tube need to replaced with tip extending into the small bowel PPI twice daily Sucralfate 1gm before meals  eSigned:  Marsa Aris, MD 04/21/2015 12:41 PM    CC:

## 2015-04-21 NOTE — Progress Notes (Signed)
Hypoglycemic Event  CBG: 34  Treatment: D50 IV 25 mL  Symptoms: None  Follow-up CBG: Time:1640  CBG Result:91  Possible Reasons for Event: Inadequate meal intake  Comments/MD notified:Dr Mcclung    Christian Wall, Christian Wall

## 2015-04-21 NOTE — Progress Notes (Signed)
[facility] TEAM 1 - Stepdown/ICU TEAM PROGRESS NOTE  Christian Wall WIO:[id number] DOB: 18-Feb-1961 DOA: 04/20/2015 PCP: Dorrene German, MD  Admit HPI / Brief Narrative: 54 y.o. male with a history significant for end-stage chronic systolic CHF EF 20-25%, encephalopathy, leg atrophy, DM 2, CKD 3, and PVD with femoral stents who presented with hematemesis.  The patient had been at [facility] for a few weeks after his last hospitalization for hypoxic respiratory failure and septic shock.  10/14 at 3 AM he reportedly vomited blood. His blood pressure dropped into the 80s over 50s, and he received 2 units PRBC, and the patient was transferred to [facility] ED.  Review of the patient's chart shows that he has a very unfortunate history of chronic leg atrophy from prolonged hospitalization years ago, but developed acutely this past May new systolic heart failure with EF 20-25%, presumed NICM followed by Dr. Clarise Cruz, admitted 8 times this summer, on home inotropes for a time, but stopped in the last 3 weeks because of treatment failure, who had several months of chronic hepatitis (presumably related to CHF) and now in the last few weeks with unexplained encephalopathy. During his last hospitalization Palliative Care was involved, but the patient's wife prefered not to meet with them or change his GOC.    In the ED, the patient's blood pressure returned to the 100s systolic. He was encephalopathic, had hypernatremia, stable liver failure, and his Hgb rebounded to 9.0 g/dL after transfusion.  HPI/Subjective: Pt seen for f/u visit.  Assessment/Plan:  UGIB and acute blood loss anemia  Malpositioned feeding tube First noted on KUB 10/12 - will need post-pyloric tube per IR   End-stage systolic CHF EF 15-20% with severe RV dysfunction as well  Hypernatremia  Encephalopathy  Liver failure   Leukocytosis   DM   Code Status: FULL Family Communication:  Disposition Plan:  SDU  Consultants: [facility]   Procedures: EGD - 10/15 - Extensive ulceration in the esophagus extending from 30 cm to GE junction at 40cm, tip of the feeding tube was coiled in the mid esophagus.  Antibiotics: none  DVT prophylaxis: SCDs  Objective: Blood pressure 101/64, pulse 86, temperature 97.7 F (36.5 C), temperature source Oral, resp. rate 12, height 5\' 6"  (1.676 m), weight 80.4 kg (177 lb 4 oz), SpO2 100 %.  Intake/Output Summary (Last 24 hours) at 04/21/15 1332 Last data filed at 04/21/15 1300  Gross per 24 hour  Intake   1075 ml  Output    625 ml  Net    450 ml   Exam: Pt seen for f/u visit.  Data Reviewed: Basic Metabolic Panel:  Recent Labs Lab 04/15/15 0805 04/16/15 0508  04/18/15 0630 04/19/15 0645 04/20/15 1544 04/20/15 1841 04/20/15 1901 04/21/15 0015 04/21/15 0500  NA 140 147*  < > 152* 154* 146* 152* 153* 151* 152*  K 3.5 3.6  < > 3.6 2.8* 3.5 3.6 3.7 3.6 3.7  CL 103 105  < > 108 107 107 110 106 111 112*  CO2 26 30  < > 32 35* 31 33*  --  33* 32  GLUCOSE 196* 227*  < > 285* 168* 200* 140* 136* 77 58*  BUN 117* 126*  < > 106* 89* 94* 102* 91* 99* 98*  CREATININE 1.88* 1.75*  < > 1.85* 1.34* 1.48* 1.63* 1.50* 1.52* 1.75*  CALCIUM 7.5* 8.5*  < > 8.1* 8.3* 7.1* 8.1*  --  8.1* 8.3*  MG 2.1 2.4  --  2.2 2.0  --   --   --   --   --  PHOS 5.2* 5.3*  --  4.6 3.7  --   --   --   --   --   < > = values in this interval not displayed.  CBC:  Recent Labs Lab 04/15/15 0805 04/16/15 0508 04/17/15 0637 04/18/15 0630 04/19/15 0645 04/20/15 0141 04/20/15 1544 04/20/15 1841 04/20/15 1901 04/21/15 0500  WBC 23.0* 19.2* 19.8* 17.1* 15.1*  --   --  14.3*  --  16.6*  NEUTROABS 21.1* 17.1* 17.6* 14.5*  --   --   --  11.6*  --   --   HGB 7.3* 7.7* 7.6* 7.4* 7.7* 5.5* 9.0* 9.7* 11.9* 9.9*  HCT 25.1* 27.3* 27.2* 26.8* 28.0* 20.2* 29.3* 30.8* 35.0* 31.4*  MCV 68.8* 69.8* 71.2* 72.6* 72.7*  --   --  76.8*  --  77.3*  PLT 183 188 171 168 159  --   --   108*  --  110*    Liver Function Tests:  Recent Labs Lab 04/16/15 0508 04/18/15 0630 04/20/15 1544 04/20/15 1841 04/21/15 0500  AST 169*  --  95* 105* 98*  ALT 122*  --  80* 84* 81*  ALKPHOS 1683*  --  911* 941* 935*  BILITOT 5.2*  --  3.3* 3.4* 3.5*  PROT 6.4*  --  5.2* 5.6* 6.5  ALBUMIN 1.8* 1.8* 1.5* 1.7* 1.8*    Recent Labs Lab 04/16/15 0454 04/20/15 1841  AMMONIA 44* 44*   Coags:  Recent Labs Lab 04/20/15 1841 04/21/15 0500  INR 1.60* 1.62*    Recent Results (from the past 240 hour(s))  Culture, blood (routine x 2)     Status: None   Collection Time: 04/13/15  2:15 PM  Result Value Ref Range Status   Specimen Description BLOOD LEFT ANTECUBITAL  Final   Special Requests BOTTLES DRAWN AEROBIC AND ANAEROBIC 5CC  Final   Culture NO GROWTH 5 DAYS  Final   Report Status 04/18/2015 FINAL  Final  Culture, blood (routine x 2)     Status: None   Collection Time: 04/13/15  2:30 PM  Result Value Ref Range Status   Specimen Description BLOOD LEFT HAND  Final   Special Requests BOTTLES DRAWN AEROBIC AND ANAEROBIC 5CC  Final   Culture NO GROWTH 5 DAYS  Final   Report Status 04/18/2015 FINAL  Final  Culture, Urine     Status: None   Collection Time: 04/13/15  3:53 PM  Result Value Ref Range Status   Specimen Description URINE, RANDOM  Final   Special Requests NONE  Final   Culture NO GROWTH 1 DAY  Final   Report Status 04/14/2015 FINAL  Final  C difficile quick scan w PCR reflex     Status: Abnormal   Collection Time: 04/13/15 10:16 PM  Result Value Ref Range Status   C Diff antigen POSITIVE (A) NEGATIVE Final   C Diff toxin NEGATIVE NEGATIVE Final   C Diff interpretation   Final    C. difficile present, but toxin not detected. This indicates colonization. In most cases, this does not require treatment. If patient has signs and symptoms consistent with colitis, consider treatment. Requires ENTERIC precautions.     Studies:   Recent x-ray studies have been  reviewed in detail by the Attending Physician  Scheduled Meds:  Scheduled Meds: . antiseptic oral rinse  7 mL Mouth Rinse q12n4p  . atorvastatin  40 mg Oral q1800  . buPROPion  150 mg Oral BID  . chlorhexidine  15 mL Mouth  Rinse BID  . digoxin  0.125 mg Oral Daily  . DULoxetine  30 mg Oral Daily  . pantoprazole (PROTONIX) IV  40 mg Intravenous Q12H  . rifaximin  550 mg Oral BID  . sodium chloride  3 mL Intravenous Q12H    Time spent on care of this patient: No charge   Lonia Blood , MD   [facility] Office  [phone] Pager - Text Page per Loretha Stapler as per below:  On-Call/Text Page:      [email or web address]      password [email or web address]  If 7PM-7AM, please contact night-coverage [email or web address] Password [email or web address] 04/21/2015, 1:32 PM   LOS: 1 day

## 2015-04-21 NOTE — Progress Notes (Signed)
Hypoglycemic Event  CBG: 65  Treatment: D50 IV 25 mL  Symptoms: None  Follow-up CBG: Time: 2010 CBG Result: 99  Possible Reasons for Event: Other: NPO  Comments/MD notified:Tom Claiborne Billings, NP notified.    Herma Ard

## 2015-04-21 NOTE — Progress Notes (Signed)
Pt having bedside EGD at this time. Marisue Ivan RN

## 2015-04-21 NOTE — Progress Notes (Signed)
Patient for EGD this AM just to evaluate for source of hematemesis.  Patient is hemodynamically stable.

## 2015-04-21 NOTE — Progress Notes (Signed)
Pt confused, agitated/combative, using obscenities/racial slurs. States he wants to get out of bed and have a cold drink; educated patient on NPO status for endoscopy and the need to stay in bed for safety. Was able to successfully draw AM labs from PICC. Will continue to monitor.

## 2015-04-22 ENCOUNTER — Inpatient Hospital Stay (HOSPITAL_COMMUNITY): Payer: Medicare Other

## 2015-04-22 DIAGNOSIS — T85598D Other mechanical complication of other gastrointestinal prosthetic devices, implants and grafts, subsequent encounter: Secondary | ICD-10-CM

## 2015-04-22 DIAGNOSIS — K920 Hematemesis: Secondary | ICD-10-CM

## 2015-04-22 LAB — COMPREHENSIVE METABOLIC PANEL
ALT: 69 U/L — AB (ref 17–63)
AST: 74 U/L — AB (ref 15–41)
Albumin: 1.7 g/dL — ABNORMAL LOW (ref 3.5–5.0)
Alkaline Phosphatase: 1015 U/L — ABNORMAL HIGH (ref 38–126)
Anion gap: 8 (ref 5–15)
BUN: 72 mg/dL — AB (ref 6–20)
CHLORIDE: 109 mmol/L (ref 101–111)
CO2: 32 mmol/L (ref 22–32)
CREATININE: 1.39 mg/dL — AB (ref 0.61–1.24)
Calcium: 8.1 mg/dL — ABNORMAL LOW (ref 8.9–10.3)
GFR calc Af Amer: >60 mL/min (ref >60)
GFR calc non Af Amer: 56 mL/min — ABNORMAL LOW (ref >60)
Glucose, Bld: 113 mg/dL — ABNORMAL HIGH (ref 65–99)
POTASSIUM: 3.3 mmol/L — AB (ref 3.5–5.1)
SODIUM: 149 mmol/L — AB (ref 135–145)
Total Bilirubin: 3.7 mg/dL — ABNORMAL HIGH (ref 0.3–1.2)
Total Protein: 5.9 g/dL — ABNORMAL LOW (ref 6.5–8.1)

## 2015-04-22 LAB — AMMONIA: AMMONIA: 32 umol/L (ref 9–35)

## 2015-04-22 LAB — GLUCOSE, CAPILLARY
GLUCOSE-CAPILLARY: 96 mg/dL (ref 65–99)
GLUCOSE-CAPILLARY: 159 mg/dL — AB (ref 65–99)
Glucose-Capillary: 95 mg/dL (ref 65–99)
Glucose-Capillary: 116 mg/dL — ABNORMAL HIGH (ref 65–99)
Glucose-Capillary: 145 mg/dL — ABNORMAL HIGH (ref 65–99)
Glucose-Capillary: 160 mg/dL — ABNORMAL HIGH (ref 65–99)
Glucose-Capillary: 163 mg/dL — ABNORMAL HIGH (ref 65–99)

## 2015-04-22 LAB — CBC
HCT: 30.4 % — ABNORMAL LOW (ref 39.0–52.0)
HEMOGLOBIN: 9.2 g/dL — AB (ref 13.0–17.0)
MCH: 24.2 pg — AB (ref 26.0–34.0)
MCHC: 30.3 g/dL (ref 30.0–36.0)
MCV: 80 fL (ref 78.0–100.0)
PLATELETS: 102 10*3/uL — AB (ref 150–400)
RBC: 3.8 MIL/uL — AB (ref 4.22–5.81)
RDW: 27.7 % — ABNORMAL HIGH (ref 11.5–15.5)
WBC: 13.4 10*3/uL — ABNORMAL HIGH (ref 4.0–10.5)

## 2015-04-22 LAB — DIGOXIN LEVEL: DIGOXIN LVL: 0.7 ng/mL — AB (ref 0.8–2.0)

## 2015-04-22 MED ORDER — DIGOXIN 0.25 MG/ML IJ SOLN
0.1250 mg | Freq: Every day | INTRAMUSCULAR | Status: DC
  Administered 2015-04-22 – 2015-04-23 (×2): 0.125 mg via INTRAVENOUS
  Filled 2015-04-22 (×4): qty 0.5

## 2015-04-22 NOTE — Progress Notes (Signed)
Utilization Review Completed.Christian Wall T10/16/2016  

## 2015-04-22 NOTE — Progress Notes (Signed)
[facility] TEAM 1 - Stepdown/ICU TEAM PROGRESS NOTE  Christian Wall ZOX:[id number] DOB: Jun 24, 1961 DOA: 04/20/2015 PCP: Dorrene German, MD  Admit HPI / Brief Narrative: 54 y.o. male with a history significant for end-stage chronic systolic CHF EF 20-25%, encephalopathy, leg atrophy, DM 2, CKD 3, and PVD with femoral stents who presented with hematemesis.  The patient had been at [facility] for a few weeks after his last hospitalization for hypoxic respiratory failure and septic shock.  10/14 at 3 AM he reportedly vomited blood. His blood pressure dropped into the 80s over 50s, and he received 2 units PRBC, and the patient was transferred to [facility] ED.  Review of the patient's chart shows that he has a very unfortunate history of chronic leg atrophy from prolonged hospitalization years ago, but developed acutely this past May new systolic heart failure with EF 20-25%, presumed NICM followed by Dr. Clarise Cruz, admitted 8 times this summer, on home inotropes for a time, but stopped in the last 3 weeks because of treatment failure, who had several months of chronic hepatitis (presumably related to CHF) and now in the last few weeks with unexplained encephalopathy. During his last hospitalization Palliative Care was involved, but the patient's wife prefered not to meet with them or change his GOC.    In the ED, the patient's blood pressure returned to the 100s systolic. He was encephalopathic, had hypernatremia, stable liver failure, and his Hgb rebounded to 9.0 g/dL after transfusion.  HPI/Subjective: The patient is alert but delirious.  He cannot provide a reliable history.  He does not appear to be in distress or pain.  There is no family present at time of my exam.  Assessment/Plan:  UGIB and acute blood loss anemia- esophageal ulceration Felt to be due to malpositioned feeding tube - follow-up KUB today reveals tube has become malpositioned again despite the fact that it was  redirected during EGD - remove feeding tube today - PPI twice a day plus Carafate - will attempt to avoid NG tube  Malpositioned feeding tube First noted on KUB 10/12 - despite repositioning during endoscopy KUB today reveals tube has become malpositioned again - remove NG as discussed above - discussed options with family tomorrow with consideration is being given to supplemental feeding or possible percutaneous tube per IR  End-stage systolic CHF EF 15-20% with severe RV dysfunction as well - deemed terminal by CHF team with only conservative medical measures still available for treatment  Hypernatremia Sodium level improving - follow  Encephalopathy Idiopathic despite extensive previous workup - felt to be result of end-stage/terminal CHF  Liver failure  Felt to be result of terminal CHF  Leukocytosis  Likely related to severe irritation/erosion of the esophagus - improving  DM  Hypoglycemia has been an issue in the absence of tube feeding - continue dextrose via IV as needed - no evidence of severe volume overload at present  Code Status: FULL Family Communication: No family present at time of exam today Disposition Plan: SDU  Consultants: [facility]   Procedures: EGD - 10/15 - Extensive ulceration in the esophagus extending from 30 cm to GE junction at 40cm, tip of the feeding tube was coiled in the mid esophagus.  Antibiotics: none  DVT prophylaxis: SCDs  Objective: Blood pressure 109/75, pulse 90, temperature 98.1 F (36.7 C), temperature source Oral, resp. rate 14, height  (1.676 m), weight 78.3 kg (172 lb 9.9 oz), SpO2 99 %.  Intake/Output Summary (Last 24 hours) at 04/22/15 1501  Last data filed at 04/22/15 1300  Gross per 24 hour  Intake 1112.5 ml  Output   1450 ml  Net -337.5 ml   Exam: General: No acute respiratory distress - alert but delirious Lungs: Clear to auscultation bilaterally without wheezes or crackles Cardiovascular: Regular rate  and rhythm without murmur gallop or rub normal S1 and S2 Abdomen: Nontender, nondistended, soft, bowel sounds positive, no rebound, no ascites, no appreciable mass Extremities: No significant cyanosis, clubbing, or edema bilateral lower extremities   Data Reviewed: Basic Metabolic Panel:  Recent Labs Lab 04/16/15 0508  04/18/15 0630 04/19/15 0645 04/20/15 1544 04/20/15 1841 04/20/15 1901 04/21/15 0015 04/21/15 0500 04/22/15 0400  NA 147*  < > 152* 154* 146* 152* 153* 151* 152* 149*  K 3.6  < > 3.6 2.8* 3.5 3.6 3.7 3.6 3.7 3.3*  CL 105  < > 108 107 107 110 106 111 112* 109  CO2 30  < > 32 35* 31 33*  --  33* 32 32  GLUCOSE 227*  < > 285* 168* 200* 140* 136* 77 58* 113*  BUN 126*  < > 106* 89* 94* 102* 91* 99* 98* 72*  CREATININE 1.75*  < > 1.85* 1.34* 1.48* 1.63* 1.50* 1.52* 1.75* 1.39*  CALCIUM 8.5*  < > 8.1* 8.3* 7.1* 8.1*  --  8.1* 8.3* 8.1*  MG 2.4  --  2.2 2.0  --   --   --   --   --   --   PHOS 5.3*  --  4.6 3.7  --   --   --   --   --   --   < > = values in this interval not displayed.  CBC:  Recent Labs Lab 04/16/15 0508 04/17/15 0637 04/18/15 0630 04/19/15 0645  04/20/15 1841 04/20/15 1901 04/21/15 0500 04/21/15 1800 04/22/15 0400  WBC 19.2* 19.8* 17.1* 15.1*  --  14.3*  --  16.6* 14.5* 13.4*  NEUTROABS 17.1* 17.6* 14.5*  --   --  11.6*  --   --   --   --   HGB 7.7* 7.6* 7.4* 7.7*  < > 9.7* 11.9* 9.9* 9.3* 9.2*  HCT 27.3* 27.2* 26.8* 28.0*  < > 30.8* 35.0* 31.4* 30.5* 30.4*  MCV 69.8* 71.2* 72.6* 72.7*  --  76.8*  --  77.3* 78.6 80.0  PLT 188 171 168 159  --  108*  --  110* 107* 102*  < > = values in this interval not displayed.  Liver Function Tests:  Recent Labs Lab 04/16/15 0508 04/18/15 0630 04/20/15 1544 04/20/15 1841 04/21/15 0500 04/22/15 0400  AST 169*  --  95* 105* 98* 74*  ALT 122*  --  80* 84* 81* 69*  ALKPHOS 1683*  --  911* 941* 935* 1015*  BILITOT 5.2*  --  3.3* 3.4* 3.5* 3.7*  PROT 6.4*  --  5.2* 5.6* 6.5 5.9*  ALBUMIN 1.8*  1.8* 1.5* 1.7* 1.8* 1.7*    Recent Labs Lab 04/16/15 0454 04/20/15 1841 04/22/15 0434  AMMONIA 44* 44* 32   Coags:  Recent Labs Lab 04/20/15 1841 04/21/15 0500  INR 1.60* 1.62*    Recent Results (from the past 240 hour(s))  Culture, blood (routine x 2)     Status: None   Collection Time: 04/13/15  2:15 PM  Result Value Ref Range Status   Specimen Description BLOOD LEFT ANTECUBITAL  Final   Special Requests BOTTLES DRAWN AEROBIC AND ANAEROBIC 5CC  Final   Culture NO  GROWTH 5 DAYS  Final   Report Status 04/18/2015 FINAL  Final  Culture, blood (routine x 2)     Status: None   Collection Time: 04/13/15  2:30 PM  Result Value Ref Range Status   Specimen Description BLOOD LEFT HAND  Final   Special Requests BOTTLES DRAWN AEROBIC AND ANAEROBIC 5CC  Final   Culture NO GROWTH 5 DAYS  Final   Report Status 04/18/2015 FINAL  Final  Culture, Urine     Status: None   Collection Time: 04/13/15  3:53 PM  Result Value Ref Range Status   Specimen Description URINE, RANDOM  Final   Special Requests NONE  Final   Culture NO GROWTH 1 DAY  Final   Report Status 04/14/2015 FINAL  Final  C difficile quick scan w PCR reflex     Status: Abnormal   Collection Time: 04/13/15 10:16 PM  Result Value Ref Range Status   C Diff antigen POSITIVE (A) NEGATIVE Final   C Diff toxin NEGATIVE NEGATIVE Final   C Diff interpretation   Final    C. difficile present, but toxin not detected. This indicates colonization. In most cases, this does not require treatment. If patient has signs and symptoms consistent with colitis, consider treatment. Requires ENTERIC precautions.     Studies:   Recent x-ray studies have been reviewed in detail by the Attending Physician  Scheduled Meds:  Scheduled Meds: . antiseptic oral rinse  7 mL Mouth Rinse q12n4p  . atorvastatin  40 mg Oral q1800  . buPROPion  150 mg Oral BID  . chlorhexidine  15 mL Mouth Rinse BID  . digoxin  0.125 mg Oral Daily  . DULoxetine  30  mg Oral Daily  . pantoprazole (PROTONIX) IV  40 mg Intravenous Q12H  . rifaximin  550 mg Oral BID  . sodium chloride  3 mL Intravenous Q12H    Time spent on care of this patient: 35 mins   Cristin Penaflor T , MD   [facility] Office  [phone] Pager - Text Page per Loretha Stapler as per below:  On-Call/Text Page:      [email or web address]      password [email or web address]  If 7PM-7AM, please contact night-coverage [email or web address] Password [email or web address] 04/22/2015, 3:01 PM   LOS: 2 days

## 2015-04-22 NOTE — Progress Notes (Signed)
Removed NG tube per MD order. Marisue Ivan RN

## 2015-04-23 ENCOUNTER — Encounter (HOSPITAL_COMMUNITY): Payer: Self-pay | Admitting: Physician Assistant

## 2015-04-23 LAB — COMPREHENSIVE METABOLIC PANEL
ALBUMIN: 1.7 g/dL — AB (ref 3.5–5.0)
ALK PHOS: 981 U/L — AB (ref 38–126)
ALT: 58 U/L (ref 17–63)
AST: 57 U/L — AB (ref 15–41)
Anion gap: 8 (ref 5–15)
BILIRUBIN TOTAL: 3.6 mg/dL — AB (ref 0.3–1.2)
BUN: 51 mg/dL — ABNORMAL HIGH (ref 6–20)
CALCIUM: 8 mg/dL — AB (ref 8.9–10.3)
CO2: 30 mmol/L (ref 22–32)
Chloride: 110 mmol/L (ref 101–111)
Creatinine, Ser: 1.2 mg/dL (ref 0.61–1.24)
GFR calc Af Amer: >60 mL/min (ref >60)
GFR calc non Af Amer: >60 mL/min (ref >60)
GLUCOSE: 201 mg/dL — AB (ref 65–99)
Potassium: 3.3 mmol/L — ABNORMAL LOW (ref 3.5–5.1)
SODIUM: 148 mmol/L — AB (ref 135–145)
TOTAL PROTEIN: 5.6 g/dL — AB (ref 6.5–8.1)

## 2015-04-23 LAB — PROTIME-INR
INR: 1.49 (ref 0.00–1.49)
Prothrombin Time: 18.1 seconds — ABNORMAL HIGH (ref 11.6–15.2)

## 2015-04-23 LAB — CBC
HEMATOCRIT: 31 % — AB (ref 39.0–52.0)
HEMOGLOBIN: 9.2 g/dL — AB (ref 13.0–17.0)
MCH: 24.2 pg — ABNORMAL LOW (ref 26.0–34.0)
MCHC: 29.7 g/dL — AB (ref 30.0–36.0)
MCV: 81.6 fL (ref 78.0–100.0)
Platelets: 95 10*3/uL — ABNORMAL LOW (ref 150–400)
RBC: 3.8 MIL/uL — ABNORMAL LOW (ref 4.22–5.81)
RDW: 28.7 % — AB (ref 11.5–15.5)
WBC: 10.3 10*3/uL (ref 4.0–10.5)

## 2015-04-23 LAB — GLUCOSE, CAPILLARY
GLUCOSE-CAPILLARY: 174 mg/dL — AB (ref 65–99)
GLUCOSE-CAPILLARY: 186 mg/dL — AB (ref 65–99)
GLUCOSE-CAPILLARY: 302 mg/dL — AB (ref 65–99)
Glucose-Capillary: 216 mg/dL — ABNORMAL HIGH (ref 65–99)
Glucose-Capillary: 145 mg/dL — ABNORMAL HIGH (ref 65–99)

## 2015-04-23 LAB — APTT: APTT: 24 s (ref 24–37)

## 2015-04-23 MED ORDER — ENSURE ENLIVE PO LIQD
237.0000 mL | Freq: Three times a day (TID) | ORAL | Status: DC
  Administered 2015-04-23 – 2015-04-24 (×2): 237 mL via ORAL

## 2015-04-23 MED ORDER — INSULIN ASPART 100 UNIT/ML ~~LOC~~ SOLN
0.0000 [IU] | Freq: Three times a day (TID) | SUBCUTANEOUS | Status: DC
  Administered 2015-04-23: 7 [IU] via SUBCUTANEOUS
  Administered 2015-04-24: 3 [IU] via SUBCUTANEOUS
  Administered 2015-04-24: 1 [IU] via SUBCUTANEOUS

## 2015-04-23 MED ORDER — INSULIN ASPART 100 UNIT/ML ~~LOC~~ SOLN: 0.0000 [IU] | Freq: Every day | SUBCUTANEOUS | Status: DC

## 2015-04-23 MED ORDER — ADULT MULTIVITAMIN W/MINERALS CH
1.0000 | ORAL_TABLET | Freq: Every day | ORAL | Status: DC
  Administered 2015-04-24: 1 via ORAL
  Filled 2015-04-23 (×2): qty 1

## 2015-04-23 MED ORDER — POTASSIUM CHLORIDE 10 MEQ/100ML IV SOLN
10.0000 meq | INTRAVENOUS | Status: AC
  Administered 2015-04-23 (×3): 10 meq via INTRAVENOUS
  Filled 2015-04-23 (×3): qty 100

## 2015-04-23 MED ORDER — LORAZEPAM 2 MG/ML IJ SOLN
0.5000 mg | Freq: Once | INTRAMUSCULAR | Status: AC
  Administered 2015-04-23: 0.5 mg via INTRAVENOUS
  Filled 2015-04-23: qty 1

## 2015-04-23 NOTE — Consult Note (Signed)
   WOC wound consult note Pt familiar to WOC from previous admission last month. Reason for Consult: Consult requested for sacrum. Pt previously had a stage 4 pressure injury to sacrum which healed, and then scar tissue reopened last month.  Right heel with deep tissue injury; .5X.5cm, darker colored intact skin. Wound type: Sacrum with chronic stage 4 pressure injury, .5X.5X.2cm, white and moist with bone palpable, located directly over sacral bone in a deep valley. White macerated skin surrounding No odor, mod amt yellow drainage.  Difficult to keep wound from becoming soiled related to incontinent stools and located close to rectum.  Flexiseal has been inserted but patient is leaking around the tube. Pressure Ulcer POA: Yes Dressing procedure/placement/frequency: Aquacel to absorb drainage and provide antimicrobial benefits. Foam dressing to sacrum.  Pt has Prevalon boots to reduce pressure to bilat heels.  Please re-consult if further assistance is needed. Thank-you,  Cammie Mcgee MSN, RN, CWOCN, [facility], CNS [phone]

## 2015-04-23 NOTE — Care Management Important Message (Signed)
Important Message  Patient Details  Name: Josejesus Drews MRN: [id number] Date of Birth: 1961-02-27   Medicare Important Message Given:  Yes-second notification given    Kyla Balzarine 04/23/2015, 11:55 AM

## 2015-04-23 NOTE — Progress Notes (Addendum)
[place] TEAM 1 - Stepdown/ICU TEAM PROGRESS NOTE  Nathnael Shawcroft FYT:[id number] DOB: 10-02-1960 DOA: 04/20/2015 PCP: Dorrene German, MD  Admit HPI / Brief Narrative: 54 y.o. male with a history significant for end-stage chronic systolic CHF EF 20-25%, encephalopathy, leg atrophy, DM 2, CKD 3, and PVD with femoral stents who presented with hematemesis.  The patient had been at [facility] for a few weeks after his last hospitalization for hypoxic respiratory failure and septic shock.  10/14 at 3 AM he reportedly vomited blood. His blood pressure dropped into the 80s over 50s, and he received 2 units PRBC, and the patient was transferred to [facility] ED.  Review of the patient's chart shows that he has a very unfortunate history of chronic leg atrophy from prolonged hospitalization years ago, but developed acutely this past May new systolic heart failure with EF 20-25%, presumed NICM followed by Dr. Clarise Cruz, admitted 8 times this summer, on home inotropes for a time, but stopped in the last 3 weeks because of treatment failure, who had several months of chronic hepatitis (presumably related to CHF) and now in the last few weeks with unexplained encephalopathy. During his last hospitalization Palliative Care was involved, but the patient's wife prefered not to meet with them or change his GOC.    In the ED, the patient's blood pressure returned to the 100s systolic. He was encephalopathic, had hypernatremia, stable liver failure, and his Hgb rebounded to 9.0 g/dL after transfusion.  HPI/Subjective: The patient is alert and jovial but remains quite confused.  He has been pulling his Foley catheter as well as his rectal catheter.  He denies chest pain fevers chills nausea vomiting or abdominal pain.  He states he is very hungry and would also like a drink of water.  Assessment/Plan:  UGIB and acute blood loss anemia- esophageal ulceration Felt to be due to malpositioned feeding tube -  follow-up KUB revealed tube had become malpositioned again despite the fact that it was redirected during EGD - removed feeding tube 10/16 - PPI twice a day plus Carafate - will attempt to avoid NG tube - hemoglobin stable at this time  Malpositioned feeding tube First noted on KUB 10/12 - despite repositioning during endoscopy KUB 10/16 revealed the  tube had become malpositioned again - removed NG as discussed above - discussed options with wife at bedside - desire to avoid NG tube to allow esophageal ulcers to heal - wife and patient are opposed to idea of percutaneous gastrostomy tube at this time - SLP to reevaluate today and hopefully there will be a diet consistent seed the patient can safely tolerate  End-stage systolic CHF EF 15-20% with severe RV dysfunction as well - deemed terminal by CHF team with only conservative medical measures still available for treatment - appears reasonably well compensated at present  Hypernatremia Sodium level improving   Encephalopathy Idiopathic despite extensive previous workup - felt to be result of end-stage/terminal CHF - mental status is stable though patient remains confused  Liver failure  Felt to be result of terminal CHF  Leukocytosis  Likely related to severe irritation/erosion of the esophagus - improving with treatment of same and discontinuation of NG tube  DM  Hypoglycemia has been an issue in the absence of tube feeding - continue dextrose via IV as needed - no evidence of severe volume overload at present  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  Antibiotics: none  DVT prophylaxis: SCDs  Objective: Blood pressure 120/79, pulse 93, temperature 97.4 F (36.3 C), temperature source Axillary, resp. rate 17, height  (1.676 m), weight 78.3 kg (172 lb 9.9 oz), SpO2 98 %.  Intake/Output Summary (Last 24 hours) at 04/23/15 1334 Last data filed at 04/23/15 0600  Gross per 24 hour  Intake    850 ml  Output   1400 ml  Net   -550 ml   Exam: General: No acute respiratory distress - alert and conversant but not competent Lungs: Clear to auscultation bilaterally without wheezes / crackles Cardiovascular: Regular rate and rhythm without murmur gallop or rub  Abdomen: Nontender, nondistended, soft, bowel sounds positive, no rebound, no ascites, no appreciable mass Extremities: No significant cyanosis, clubbing, edema bilateral lower extremities   Data Reviewed: Basic Metabolic Panel:  Recent Labs Lab 04/18/15 0630 04/19/15 0645  04/20/15 1841 04/20/15 1901 04/21/15 0015 04/21/15 0500 04/22/15 0400 04/23/15 0500  NA 152* 154*  < > 152* 153* 151* 152* 149* 148*  K 3.6 2.8*  < > 3.6 3.7 3.6 3.7 3.3* 3.3*  CL 108 107  < > 110 106 111 112* 109 110  CO2 32 35*  < > 33*  --  33* 32 32 30  GLUCOSE 285* 168*  < > 140* 136* 77 58* 113* 201*  BUN 106* 89*  < > 102* 91* 99* 98* 72* 51*  CREATININE 1.85* 1.34*  < > 1.63* 1.50* 1.52* 1.75* 1.39* 1.20  CALCIUM 8.1* 8.3*  < > 8.1*  --  8.1* 8.3* 8.1* 8.0*  MG 2.2 2.0  --   --   --   --   --   --   --   PHOS 4.6 3.7  --   --   --   --   --   --   --   < > = values in this interval not displayed.  CBC:  Recent Labs Lab 04/17/15 0637 04/18/15 0630  04/20/15 1841 04/20/15 1901 04/21/15 0500 04/21/15 1800 04/22/15 0400 04/23/15 0500  WBC 19.8* 17.1*  < > 14.3*  --  16.6* 14.5* 13.4* 10.3  NEUTROABS 17.6*  14.5*  --  11.6*  --   --   --   --   --   HGB 7.6* 7.4*  < > 9.7* 11.9* 9.9* 9.3* 9.2* 9.2*  HCT 27.2* 26.8*  < > 30.8* 35.0* 31.4* 30.5* 30.4* 31.0*  MCV 71.2* 72.6*  < > 76.8*  --  77.3* 78.6 80.0 81.6  PLT 171 168  < > 108*  --  110* 107* 102* 95*  < > = values in this interval not displayed.  Liver Function Tests:  Recent Labs Lab 04/20/15 1544 04/20/15 1841 04/21/15 0500 04/22/15 0400 04/23/15 0500  AST 95* 105* 98* 74* 57*  ALT 80* 84* 81* 69* 58  ALKPHOS 911* 941* 935* 1015* 981*  BILITOT 3.3* 3.4* 3.5* 3.7* 3.6*  PROT 5.2* 5.6* 6.5 5.9* 5.6*  ALBUMIN  1.5* 1.7* 1.8* 1.7* 1.7*    Recent Labs Lab 04/20/15 1841 04/22/15 0434  AMMONIA 44* 32   Coags:  Recent Labs Lab 04/20/15 1841 04/21/15 0500 04/23/15 0500  INR 1.60* 1.62* 1.49    Recent Results (from the past 240 hour(s))  Culture, blood (routine x 2)     Status: None   Collection Time: 04/13/15  2:15 PM  Result Value Ref Range Status   Specimen Description BLOOD LEFT ANTECUBITAL  Final   Special Requests BOTTLES DRAWN AEROBIC AND ANAEROBIC 5CC  Final   Culture NO GROWTH 5 DAYS  Final   Report Status 04/18/2015 FINAL  Final  Culture, blood (routine x 2)     Status: None   Collection Time: 04/13/15  2:30 PM  Result Value Ref Range Status   Specimen Description BLOOD LEFT HAND  Final   Special Requests BOTTLES DRAWN AEROBIC AND ANAEROBIC 5CC  Final   Culture NO GROWTH 5 DAYS  Final   Report Status 04/18/2015 FINAL  Final  Culture, Urine     Status: None   Collection Time: 04/13/15  3:53 PM  Result Value Ref Range Status   Specimen Description URINE, RANDOM  Final   Special Requests NONE  Final   Culture NO GROWTH 1 DAY  Final   Report Status 04/14/2015 FINAL  Final  C difficile quick scan w PCR reflex     Status: Abnormal   Collection Time: 04/13/15 10:16 PM  Result Value Ref Range Status   C Diff antigen POSITIVE (A) NEGATIVE Final   C Diff toxin NEGATIVE NEGATIVE Final   C Diff interpretation    Final    C. difficile present, but toxin not detected. This indicates colonization. In most cases, this does not require treatment. If patient has signs and symptoms consistent with colitis, consider treatment. Requires ENTERIC precautions.     Studies:   Recent x-ray studies have been reviewed in detail by the Attending Physician  Scheduled Meds:  Scheduled Meds: . antiseptic oral rinse  7 mL Mouth Rinse q12n4p  . chlorhexidine  15 mL Mouth Rinse BID  . digoxin  0.125 mg Intravenous Daily  . pantoprazole (PROTONIX) IV  40 mg Intravenous Q12H  . rifaximin  550 mg Oral BID  . sodium chloride  3 mL Intravenous Q12H    Time spent on care of this patient: 35 mins   Ricca Melgarejo T , MD   [facility] Office  [phone] Pager - Text Page per Loretha Stapler as per below:  On-Call/Text Page:      [email or web address]      password [email or web address]  If 7PM-7AM, please contact night-coverage [email or web address] Password [email or web address] 04/23/2015, 1:34 PM   LOS: 3 days

## 2015-04-23 NOTE — Progress Notes (Signed)
@  0005 [employer] paged regarding pt's pulling of Foley, Flexiseal, and Monitoring cords despite safety mittens and attempts at reorientation. @0006  page returned: order received for Ativan 0.5mg  x1. Will continue to monitor.

## 2015-04-23 NOTE — Progress Notes (Signed)
Initial Nutrition Assessment  DOCUMENTATION CODES:   Not applicable  INTERVENTION:    Ensure Enlive PO TID, each supplement provides 350 kcal and 20 grams of protein  MVI daily  NUTRITION DIAGNOSIS:   Increased nutrient needs related to wound healing as evidenced by estimated needs.  GOAL:   Patient will meet greater than or equal to 90% of their needs  MONITOR:   PO intake, Supplement acceptance, Labs, Skin, Weight trends  REASON FOR ASSESSMENT:   Low Braden    ASSESSMENT:   54 y.o. male with a history significant for end-stage chronic systolic CHF EF 20-25%, encephalopathy, leg atrophy, DM 2, CKD 3, and PVD with femoral stents who presented with hematemesis. The patient had been at [facility] for a few weeks after his last hospitalization for hypoxic respiratory failure and septic shock. 10/14 at 3 AM he reportedly vomited blood. His blood pressure dropped into the 80s over 50s, and he received 2 units PRBC, and the patient was transferred to [facility] ED.  Labs reviewed: sodium elevated, potassium low.  Currently NPO. SLP in room with patient for bedside swallow evaluation. SLP thinks patient will be okay with a regular diet and thin liquids. Patient had a feeding tube during last hospitalization, per MD notes, patient and his wife do not want NGT or PEG at this time.  Patient with 9% weight loss in the past 3 months.  Unable to complete Nutrition-Focused physical exam at this time.  Patient with increased nutrition needs to support wound healing.   Diet Order:  Diet NPO time specified  Skin:  Wound (see comment) (DTI to right heel; stage IV pressure ulcer to coccyx)  Last BM:  10/16  Height:   Ht Readings from Last 1 Encounters:  04/20/15 5\' 6"  (1.676 m)    Weight:   Wt Readings from Last 1 Encounters:  04/22/15 172 lb 9.9 oz (78.3 kg)    Ideal Body Weight:  64.5 kg  BMI:  Body mass index is 27.87 kg/(m^2).  Estimated Nutritional Needs:    Kcal:  2200-2400  Protein:  115-130 gm  Fluid:  2.2-2.4 L  EDUCATION NEEDS:   No education needs identified at this time  Joaquin Courts, RD, LDN, CNSC Pager [phone] After Hours Pager [phone]

## 2015-04-23 NOTE — Evaluation (Signed)
Clinical/Bedside Swallow Evaluation Patient Details  Name: Christian Wall MRN: [id number] Date of Birth: 1961-06-12  Today's Date: 04/23/2015 Time: SLP Start Time (ACUTE ONLY): 1432 SLP Stop Time (ACUTE ONLY): 1452 SLP Time Calculation (min) (ACUTE ONLY): 20 min  Past Medical History:  Past Medical History  Diagnosis Date  . Diabetes mellitus 2008  . 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   . Depression   . GERD (gastroesophageal reflux disease)   . Peripheral neuropathy (HCC) 2001  . Hypercholesteremia   . Hx of tracheostomy (HCC)   . Chronic systolic CHF (congestive heart failure), NYHA class 3 (HCC)     end stage as of 03/2015.  "Home"/SNF milrinone dc'd 03/2015.   Marland Kitchen Cirrhosis of liver (HCC) 03/2015    likely due to ETOH.   . Chronic calcific pancreatitis (HCC) 2001    ETOH induced.   Marland Kitchen Dysphagia 03/2015  . Peripheral vascular disease (HCC)     03/2015 right common iliac stent.   Marland Kitchen AKI (acute kidney injury) (HCC) 11/2014  . HTN (hypertension) 2008  . Foot drop, bilateral 2001    occured along with peripheral neuropathy during bout with acute pancreatitis  . Hematemesis 04/2015    EGD found erosive esophagitis due to feeding tube coiled in esophagus.   . Anemia     of chronic disease, ABL anemia 04/2015 requiring PRBC transfusion   . Candida esophagitis (HCC) 11/2013   Past Surgical History:  Past Surgical History  Procedure Laterality Date  . Pancreas surgery  2001    hx pancreatitis  . Anterior cervical decomp/discectomy fusion  04/13/2012    Procedure: ANTERIOR CERVICAL DECOMPRESSION/DISCECTOMY FUSION 1 LEVEL;  Surgeon: Temple Pacini, MD;  Location: [facility] NEURO ORS;  Service: Neurosurgery;  Laterality: N/A;  Cervcial Five-Six Anterior Cervical Decompression and Fusion with Allograft and Plating  . Cardiac catheterization N/A 11/15/2014    Procedure: Right Heart Cath;  Surgeon: Dolores Patty, MD;  Location: [facility] INVASIVE CV LAB;  Service:  Cardiovascular;  Laterality: N/A;  . Peripheral vascular catheterization N/A 02/26/2015    Procedure: Abdominal Aortogram;  Surgeon: Chuck Hint, MD;  Location: [facility] INVASIVE CV LAB;  Service: Cardiovascular;  Laterality: N/A;  . Incise and drain abcess  12/2007    scrotum  . Esophagogastroduodenoscopy  11/2013    HH, candida esophagitis   HPI:  54 y.o. male with a history significant for end-stage chronic systolic CHF EF 20-25%, encephalopathy, leg atrophy, DM 2, CKD 3, and PVD with femoral stents who presented with hematemesis. Diagnosed with UGI bleed and acute blood loss anemia, esophageal ulceration felt to be due to malpositioned NG tube, now removed. The patient had been at [facility] for a few weeks after his last hospitalization for hypoxic respiratory failure and septic shock. CXR 10/14; Mild right lower lung opacity -question atelectasis versus airspace   Assessment / Plan / Recommendation Clinical Impression  Patient presents with a functional oropharyngeal swallow without overt indication of aspiration. Risk of aspiration present given fluctuations in mentation and confusion impacting attention span and awareness. Patient may initiate a regular diet, thin liquid with full supervision to assist in attention to task. SLP will f/u briefly given h/o brief dysphagia.     Aspiration Risk  Mild    Diet Recommendation Age appropriate regular solids;Thin   Medication Administration: Whole meds with liquid Compensations: Slow rate;Small sips/bites;Minimize environmental distractions    Other  Recommendations Oral Care Recommendations: Oral  care BID      Frequency and Duration min 1 x/week  1 week   Pertinent Vitals/Pain n/a        Swallow Study    General Other Pertinent Information: 54 y.o. male with a history significant for end-stage chronic systolic CHF EF 20-25%, encephalopathy, leg atrophy, DM 2, CKD 3, and PVD with femoral stents who presented with  hematemesis. Diagnosed with UGI bleed and acute blood loss anemia, esophageal ulceration felt to be due to malpositioned NG tube, now removed. The patient had been at [facility] for a few weeks after his last hospitalization for hypoxic respiratory failure and septic shock. CXR 10/14; Mild right lower lung opacity -question atelectasis versus airspace Type of Study: Bedside swallow evaluation Previous Swallow Assessment: see HPI Diet Prior to this Study: NPO Temperature Spikes Noted: No Respiratory Status: Room air History of Recent Intubation: No Behavior/Cognition: Alert;Cooperative;Requires cueing;Confused Oral Cavity - Dentition: Adequate natural dentition/normal for age Self-Feeding Abilities: Able to feed self Patient Positioning: Upright in bed Baseline Vocal Quality: Normal Volitional Cough: Strong Volitional Swallow: Able to elicit    Oral/Motor/Sensory Function Overall Oral Motor/Sensory Function: Appears within functional limits for tasks assessed   Ice Chips Ice chips: Not tested   Thin Liquid Thin Liquid: Within functional limits Presentation: Cup;Straw    Nectar Thick Nectar Thick Liquid: Not tested   Honey Thick Honey Thick Liquid: Not tested   Puree Puree: Within functional limits Presentation: Spoon   Solid   GO   Christian Nowakowski MA, CCC-SLP [phone]  Solid: Within functional limits Presentation: Self Fed       Christian Wall 04/23/2015,3:26 PM

## 2015-04-24 ENCOUNTER — Inpatient Hospital Stay
Admission: AD | Admit: 2015-04-24 | Discharge: 2015-05-08 | Disposition: A | Discharge status: Skilled Nursing Facility | Payer: Medicare Other | Source: Ambulatory Visit | Attending: Internal Medicine | Admitting: Internal Medicine

## 2015-04-24 DIAGNOSIS — I5022 Chronic systolic (congestive) heart failure: Secondary | ICD-10-CM

## 2015-04-24 DIAGNOSIS — E1165 Type 2 diabetes mellitus with hyperglycemia: Secondary | ICD-10-CM

## 2015-04-24 DIAGNOSIS — E870 Hyperosmolality and hypernatremia: Secondary | ICD-10-CM

## 2015-04-24 DIAGNOSIS — K7210 Chronic hepatic failure without coma: Secondary | ICD-10-CM | POA: Insufficient documentation

## 2015-04-24 DIAGNOSIS — K2210 Ulcer of esophagus without bleeding: Secondary | ICD-10-CM | POA: Insufficient documentation

## 2015-04-24 DIAGNOSIS — K922 Gastrointestinal hemorrhage, unspecified: Secondary | ICD-10-CM | POA: Insufficient documentation

## 2015-04-24 DIAGNOSIS — D62 Acute posthemorrhagic anemia: Secondary | ICD-10-CM

## 2015-04-24 DIAGNOSIS — G9340 Encephalopathy, unspecified: Secondary | ICD-10-CM

## 2015-04-24 DIAGNOSIS — E118 Type 2 diabetes mellitus with unspecified complications: Secondary | ICD-10-CM

## 2015-04-24 DIAGNOSIS — IMO0002 Reserved for concepts with insufficient information to code with codable children: Secondary | ICD-10-CM | POA: Insufficient documentation

## 2015-04-24 LAB — CBC
HEMATOCRIT: 31.1 % — AB (ref 39.0–52.0)
HEMOGLOBIN: 9.1 g/dL — AB (ref 13.0–17.0)
MCH: 23.6 pg — ABNORMAL LOW (ref 26.0–34.0)
MCHC: 29.3 g/dL — ABNORMAL LOW (ref 30.0–36.0)
MCV: 80.6 fL (ref 78.0–100.0)
PLATELETS: 112 10*3/uL — AB (ref 150–400)
RBC: 3.86 MIL/uL — AB (ref 4.22–5.81)
RDW: 28.6 % — ABNORMAL HIGH (ref 11.5–15.5)
WBC: 10 10*3/uL (ref 4.0–10.5)

## 2015-04-24 LAB — BASIC METABOLIC PANEL
ANION GAP: 6 (ref 5–15)
BUN: 41 mg/dL — ABNORMAL HIGH (ref 6–20)
CHLORIDE: 110 mmol/L (ref 101–111)
CO2: 29 mmol/L (ref 22–32)
Calcium: 7.9 mg/dL — ABNORMAL LOW (ref 8.9–10.3)
Creatinine, Ser: 1.05 mg/dL (ref 0.61–1.24)
Glucose, Bld: 146 mg/dL — ABNORMAL HIGH (ref 65–99)
POTASSIUM: 3.6 mmol/L (ref 3.5–5.1)
SODIUM: 145 mmol/L (ref 135–145)

## 2015-04-24 LAB — GLUCOSE, CAPILLARY
GLUCOSE-CAPILLARY: 146 mg/dL — AB (ref 65–99)
Glucose-Capillary: 205 mg/dL — ABNORMAL HIGH (ref 65–99)

## 2015-04-24 MED ORDER — DIGOXIN 125 MCG PO TABS
0.1250 mg | ORAL_TABLET | Freq: Every day | ORAL | Status: DC
  Administered 2015-04-24: 0.125 mg via ORAL
  Filled 2015-04-24: qty 1

## 2015-04-24 MED ORDER — PANTOPRAZOLE SODIUM 40 MG PO TBEC: 40.0000 mg | DELAYED_RELEASE_TABLET | Freq: Two times a day (BID) | ORAL | Status: AC

## 2015-04-24 MED ORDER — ENSURE ENLIVE PO LIQD: 237.0000 mL | Freq: Three times a day (TID) | ORAL | Status: AC

## 2015-04-24 MED ORDER — PANTOPRAZOLE SODIUM 40 MG PO TBEC
40.0000 mg | DELAYED_RELEASE_TABLET | Freq: Two times a day (BID) | ORAL | Status: DC
  Administered 2015-04-24: 40 mg via ORAL
  Filled 2015-04-24: qty 1

## 2015-04-24 MED ORDER — SUCRALFATE 1 G PO TABS: 1.0000 g | ORAL_TABLET | Freq: Three times a day (TID) | ORAL | Status: AC

## 2015-04-24 MED ORDER — INSULIN GLARGINE 100 UNIT/ML ~~LOC~~ SOLN: 10.0000 [IU] | Freq: Every day | SUBCUTANEOUS | Status: AC

## 2015-04-24 MED ORDER — SUCRALFATE 1 G PO TABS
1.0000 g | ORAL_TABLET | Freq: Three times a day (TID) | ORAL | Status: DC
  Administered 2015-04-24: 1 g via ORAL
  Filled 2015-04-24 (×4): qty 1

## 2015-04-24 MED ORDER — INSULIN ASPART 100 UNIT/ML ~~LOC~~ SOLN: 0.0000 [IU] | Freq: Three times a day (TID) | SUBCUTANEOUS | Status: AC

## 2015-04-24 NOTE — Progress Notes (Signed)
Speech Language Pathology Treatment: Dysphagia  Patient Details Name: Christian Wall MRN: [id number] DOB: [birth year] Today's Date: 04/24/2015 Time: 3794-3276 SLP Time Calculation (min) (ACUTE ONLY): 15 min  Assessment / Plan / Recommendation Clinical Impression  During dysphagia treatment, pt demonstrated typical swallow function with no overt s/s of aspiration. Pt presents with confusion, poor sustained attention, and impulsivity which increases his risk of aspiration. SLP recommends pt have regular diet with full supervision when the pt is alert and sitting upright. No f/u SLP needed at this time, will sign off.    HPI Other Pertinent Information: 54 y.o. male with a history significant for end-stage chronic systolic CHF EF 14-70%, encephalopathy, leg atrophy, DM 2, CKD 3, and PVD with femoral stents who presented with hematemesis. Diagnosed with UGI bleed and acute blood loss anemia, esophageal ulceration felt to be due to malpositioned NG tube, now removed. The patient had been at [facility] for a few weeks after his last hospitalization for hypoxic respiratory failure and septic shock. CXR 10/14; Mild right lower lung opacity -question atelectasis versus airspace   Pertinent Vitals Pain Assessment: Faces Faces Pain Scale: No hurt  SLP Plan  All goals met    Recommendations Diet recommendations: Regular;Thin liquid Liquids provided via: Straw Medication Administration: Whole meds with liquid Supervision: Patient able to self feed;Full supervision/cueing for compensatory strategies Compensations: Slow rate;Small sips/bites;Minimize environmental distractions Postural Changes and/or Swallow Maneuvers: Seated upright 90 degrees              Oral Care Recommendations: Oral care BID Plan: All goals met    GO    Lanier Ensign, [place] 04/24/2015, 9:22 AM

## 2015-04-24 NOTE — Clinical Documentation Improvement (Signed)
Hospitalist  Can the diagnosis of pressure ulcer be further specified?   Document if pressure ulcer with stage is Present on Admission   Document Site with laterality - Elbow, Back (upper/lower), Sacral, Hip, Buttock, Ankle, Heel, Head, Other (Specify)  Pressure Ulcer Stage - Stage1, Stage 2, Stage 3, Stage 4, Unstageable, Unspecified, Unable to Clinically Determine  Other  Clinically Undetermined    Supporting Information: Wound consult - chronic stage 4 pressure ulcer on sacrum, which has difficulty healing because of incontinent of stool and close to rectum.  Area is moist and bone is palpable.   Please exercise your independent, professional judgment when responding. A specific answer is not anticipated or expected.   Thank Modesta Messing [facility] Health Information Management [place] [phone]

## 2015-04-24 NOTE — Progress Notes (Signed)
Report called to Jomarie Longs, RN on [facility]. Patient discharged to 5714 via bed by Anum Palecek, RN and [place], [place]. Family aware of discharge. Belongings sent with patient.

## 2015-04-24 NOTE — Discharge Summary (Signed)
Physician Discharge Summary  [facility] LKJ:[id number] DOB: 1961/02/02 DOA: 04/20/2015  PCP: Philis Fendt, MD  Admit date: 04/20/2015 Discharge date: 04/24/2015  Time spent: 40 minutes  Recommendations for Outpatient Follow-up: UGIB and acute blood loss anemia- esophageal ulceration -Felt to be due to malpositioned feeding tube; follow-up KUB revealed tube had become malpositioned again despite the fact that it was redirected during EGD - removed feeding tube 10/16  - PPI twice a day plus Carafate  - hemoglobin stable at this time  Malpositioned feeding tube -Removed secondary to malpositioning -Would avoid use in the future secondary to esophageal ulceration  End-stage systolic CHF -EF 79-48% with severe RV dysfunction as well - deemed terminal by CHF team with only conservative medical measures still available for treatment  -Patient to weigh himself daily and record in journal. -Sliding scale Lasix: Weigh yourself at [facility] then Daily in the Morning. Your dry weight will be what your scale says on the day you return to [facility].   If you gain more than 3 pounds from dry weight: Start 40 mg in the morning and 40 mg in the afternoon until weight returns to baseline dry weight.  If weight gain is greater than 5 pounds in 2 days: Increased to Lasix 60 mg twice a day and contact the Heart Failure office for further assistance if weight does not go down the next day. If the weight goes down more than 3 pounds from dry weight: Hold Lasix until it returns to baseline dry weight -Follow-up with 2 weeks with Dr. Glori Bickers (CHF clinic).  Hypernatremia -Resolved PCP and CHF clinic to monitor Sodium level improving   Encephalopathy -Idiopathic despite extensive previous workup - felt to be result of end-stage/terminal CHF  - mental status is stable though patient remains confused  Liver failure  -Felt to be result of terminal CHF -Continue Rifaximin 550 mg  BID -Check ammonia level M/W/F  Leukocytosis  -Resolved  DM Type 2 uncontrolled  -10/7 Hemoglobin A1c= 7.6 -Patient now on regular diet, continue sensitive SSI at meals.  -Restart Lantus 10 units daily (home dose 20 units BID). [facility] to titrate -Schedule follow-up for Dr. Nolene Ebbs for 7-10 days encephalopathy, hypernatremia, diabetes type 2 uncontrolled    Discharge Diagnoses:  Principal Problem:   GI bleed Active Problems:   Diabetes mellitus type II, uncontrolled (HCC)   Peripheral neuropathy (HCC)   Acute on chronic systolic CHF (congestive heart failure) (HCC)   CKD (chronic kidney disease) stage 3, GFR 30-59 ml/min   Pulmonary hypertension (HCC)   Diabetic nephropathy (HCC)   UGIB (upper gastrointestinal bleed)   Hematemesis without nausea   Feeding tube dysfunction   Upper GI bleed   Esophageal ulceration   Acute blood loss anemia   Chronic systolic CHF (congestive heart failure) (HCC)   Hypernatremia   Encephalopathy   Chronic liver failure without hepatic coma ([facility])   Uncontrolled type 2 diabetes mellitus with complication [facility])   Discharge Condition: Guarded  Diet recommendation: Regular  Filed Weights   04/21/15 0349 04/22/15 0317 04/24/15 0519  Weight: 80.4 kg (177 lb 4 oz) 78.3 kg (172 lb 9.9 oz) 82.691 kg (182 lb 4.8 oz)    History of present illness:  54 y.o. BM PMHx End-Stage Chronic Systolic CHF EF 01-65%, Chronic Encephalopathy, Leg Atrophy, DM Type 2, CKD 3, and PVD with femoral stents.  Presented with hematemesis. The patient had been at [facility] for a few weeks after his last hospitalization for hypoxic  respiratory failure and septic shock. 10/14 at 3 AM he reportedly vomited blood. His blood pressure dropped into the 80s over 50s, and he received 2 units PRBC, and the patient was transferred to [facility]. 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 Missy Sabins, admitted 8 times this summer, on home inotropes for a time, but stopped in the last 3 weeks because of treatment failure, who had several months of chronic hepatitis (presumably related to CHF) and now in the last few weeks with unexplained encephalopathy. During his last hospitalization Palliative Care was involved, but the patient's wife prefered not to meet with them or change his GOC.   In the ED, the patient's blood pressure returned to the 163A systolic. He was encephalopathic, had hypernatremia, stable liver failure, and his Hgb rebounded to 9.0 g/dL after transfusion. During his hospitalization patient had upper GI bleed secondary to malpositioned feeding tube. Patient's NG tube removed on 10/16 and PPI + Carafate started. Patient's hemoglobin has been stable. Overall patient's outlook is extremely grave.  Procedures: EGD - 10/15 - Extensive ulceration in the esophagus extending from 30 cm to GE junction at 40cm, tip of the feeding tube was coiled in the mid esophagus   Consultations: Dr.Kavitha Nandigam (GI)   Antibiotics NA   Discharge Exam: Filed Vitals:   04/23/15 2300 04/24/15 0519 04/24/15 0803 04/24/15 0810  BP: 119/74 125/77  109/78  Pulse: 80 88  86  Temp:  97.5 F (36.4 C) 96.5 F (35.8 C) 97.5 F (36.4 C)  TempSrc:  Axillary Axillary Oral  Resp: _0 Height:      Weight:  82.691 kg (182 lb 4.8 oz)    SpO2: 98% 100%  97%    General: A/O 1 (does not know where, when, why), believes I am [facility], NAD, negative acute respiratory failure Cardiovascular: Regular rhythm and rate, negative murmurs rubs gallops Respiratory: Clear to auscultation bilateral  Discharge Instructions     Medication List    STOP taking these medications        buPROPion 150 MG 12 hr tablet  Commonly known as:  WELLBUTRIN SR      clopidogrel 75 MG tablet  Commonly known as:  PLAVIX     [employer] VITAMIN B12 1000 MCG tablet  Generic drug:  cyanocobalamin     DULoxetine 30 MG capsule  Commonly known as:  CYMBALTA     famotidine 20 MG tablet  Commonly known as:  PEPCID     magnesium oxide 400 (241.3 MG) MG tablet  Commonly known as:  MAG-OX     torsemide 20 MG tablet  Commonly known as:  DEMADEX      TAKE these medications        atorvastatin 40 MG tablet  Commonly known as:  LIPITOR  Take 1 tablet (40 mg total) by mouth daily at 6 PM.     digoxin 0.125 MG tablet  Commonly known as:  LANOXIN  Take 1 tablet (0.125 mg total) by mouth daily.     feeding supplement (ENSURE ENLIVE) Liqd  Take 237 mLs by mouth 3 (three) times daily between meals.     ferrous sulfate 325 (65 FE) MG EC tablet  Take 1 tablet (325 mg total) by mouth daily with breakfast.     insulin  aspart 100 UNIT/ML injection  Commonly known as:  novoLOG  Inject 0-9 Units into the skin 3 (three) times daily with meals.     insulin glargine 100 UNIT/ML injection  Commonly known as:  LANTUS  Inject 0.1 mLs (10 Units total) into the skin daily.     pantoprazole 40 MG tablet  Commonly known as:  PROTONIX  Take 1 tablet (40 mg total) by mouth 2 (two) times daily.     rifaximin 550 MG Tabs tablet  Commonly known as:  XIFAXAN  Take 1 tablet (550 mg total) by mouth 2 (two) times daily.     sucralfate 1 G tablet  Commonly known as:  CARAFATE  Take 1 tablet (1 g total) by mouth 4 (four) times daily -  with meals and at bedtime.     VENTOLIN HFA 108 (90 BASE) MCG/ACT inhaler  Generic drug:  albuterol  Inhale 1-2 puffs into the lungs every 6 (six) hours as needed for wheezing or shortness of breath.       No Known Allergies Follow-up Information    Follow up with Bensimhon, Daniel, MD. Schedule an appointment as soon as possible for a visit in 2 weeks.   Specialty:  Cardiology   Why:  Patient within stage systolic CHF follow-up in 2 weeks  with Dr. Pattricia Boss information:   [place] [id number] [phone]       Follow up with Philis Fendt, MD. Schedule an appointment as soon as possible for a visit in 1 week.   Specialty:  Internal Medicine   Why:  Schedule follow-up for Dr. Nolene Ebbs for 7-10 days encephalopathy, hypernatremia, diabetes type 2 uncontrolled   Contact information:   [place]  [phone]        The results of significant diagnostics from this hospitalization (including imaging, microbiology, ancillary and laboratory) are listed below for reference.    Significant Diagnostic Studies: Dg Chest 1 View  03/27/2015  CLINICAL DATA:  Status post fall, evaluate PICC EXAM: CHEST  1 VIEW COMPARISON:  03/22/2015 FINDINGS: Cardiomegaly. Patchy/interstitial opacities, upper lobe predominant, mildly improved. Differential considerations include multifocal pneumonia versus mild interstitial edema. No pleural effusion or pneumothorax. Right arm PICC terminates in the lower SVC. IMPRESSION: Right arm PICC terminates in the lower SVC. Multifocal patchy opacities, upper lobe predominant, mildly improved. Differential considerations include multifocal pneumonia (favored) versus mild interstitial edema. Electronically Signed   By: Julian Hy M.D.   On: 03/27/2015 06:31   Ct Head Wo Contrast  03/27/2015  CLINICAL DATA:  Fall out of bed with closed-head injury EXAM: CT HEAD WITHOUT CONTRAST TECHNIQUE: Contiguous axial images were obtained from the base of the skull through the vertex without intravenous contrast. COMPARISON:  03/21/2015 FINDINGS: Bony calvarium is intact with the exception of a previous right frontal craniotomy. Mild atrophic changes are seen. Areas of encephalomalacia in the right frontal lobe are noted related to the prior surgery. No findings to suggest acute hemorrhage, acute infarction or space-occupying mass lesion  are noted. IMPRESSION: Mild atrophic changes.  No acute abnormality noted. Electronically Signed   By: Inez Catalina M.D.   On: 03/27/2015 08:27   Mr Brain Wo Contrast  04/05/2015  CLINICAL DATA:  Metabolic encephalopathy.  Altered mental status. EXAM: MRI HEAD WITHOUT CONTRAST TECHNIQUE: Multiplanar, multiecho pulse sequences of the brain and surrounding structures were obtained without intravenous contrast. COMPARISON:  CT head without contrast 03/27/2015 FINDINGS: The  study is mildly degraded by patient motion. Focal encephalomalacia is noted in the anterior right frontal lobe subjacent to the craniotomy. This is likely postoperative in nature. Mild generalized atrophy is present. There is no other significant white matter disease. No focal cortical signal abnormality is present otherwise. No acute infarct, hemorrhage, or mass lesion is present. Flow is present in the major intracranial arteries. Internal auditory canals and brainstem are normal. No significant extra-axial fluid collection is present. Flow is present in the major intracranial arteries. Globes and orbits are intact. The paranasal sinuses and mastoid air cells are clear. Midline structures are unremarkable. IMPRESSION: 1. Focal encephalomalacia in the anterior right frontal lobe is chronic, related to prior surgery. 2. Otherwise normal MRI appearance of the brain. Electronically Signed   By: San Morelle M.D.   On: 04/05/2015 11:51   US Abdomen Complete  04/03/2015  CLINICAL DATA:  Transaminitis EXAM: ULTRASOUND ABDOMEN COMPLETE COMPARISON:  Renal ultrasound 01/30/2015 FINDINGS: Gallbladder: Surgically absent Common bile duct: Diameter: 3 mm Liver: Lobulated appearance without focal mass lesion. Normal echogenicity. Small perihepatic ascites present. Antegrade flow in the imaged portal venous system. IVC: No abnormality visualized. Pancreas: Not visualized Spleen: Size and appearance within normal limits. Right Kidney: Length: 10.5 cm.  Pelviectasis, stable from abdominal CT 11/09/2013. Left Kidney: Length: 11 cm. No hydronephrosis. No evidence of solid mass. Simple cysts are noted, 33 mm in the interpolar and 45 mm in the lower pole regions. Abdominal aorta: No aneurysm visualized. Distal aorta not able to be seen but no aneurysm on 2015 CT. Other findings: None. IMPRESSION: 1. Lobulated appearance of the liver with small perihepatic ascites. Correlate for cirrhosis risk factors. 2. Chronic findings are stable from previous studies and described above. 3. Pancreas could not be visualized. Electronically Signed   By: Monte Fantasia M.D.   On: 04/03/2015 22:41   Ir Fluoro Guide Cv Line Right  04/03/2015  INDICATION: CHF EXAM: ULTRASOUND AND FLUOROSCOPIC GUIDED PICC LINE INSERTION MEDICATIONS: None CONTRAST:  None FLUOROSCOPY TIME:  60 seconds. COMPLICATIONS: None immediate TECHNIQUE: The procedure, risks, benefits, and alternatives were explained to the patient and informed written consent was obtained. A timeout was performed prior to the initiation of the procedure. The right upper extremity was prepped with chlorhexidine in a sterile fashion, and a sterile drape was applied covering the operative field. Maximum barrier sterile technique with sterile gowns and gloves were used for the procedure. A timeout was performed prior to the initiation of the procedure. Local anesthesia was provided with 1% lidocaine. Under direct ultrasound guidance, the right basilic vein was accessed with a micropuncture kit after the overlying soft tissues were anesthetized with 1% lidocaine. An ultrasound image was saved for documentation purposes. A guidewire was advanced to the level of the superior caval-atrial junction for measurement purposes and the PICC line was cut to length. A peel-away sheath was placed and a 37 cm, 5 [place], dual lumen was inserted to level of the superior caval-atrial junction. A post procedure spot fluoroscopic was obtained. The  catheter easily aspirated and flushed and was sutured in place. A dressing was placed. The patient tolerated the procedure well without immediate post procedural complication. FINDINGS: After catheter placement, the tip lies within the superior cavoatrial junction. The catheter aspirates and flushes normally and is ready for immediate use. IMPRESSION: Successful ultrasound and fluoroscopic guided placement of a right basilic vein approach, 37 cm, 5 French, dual lumen PICC with tip at the superior caval-atrial junction. The PICC line  is ready for immediate use. Read by:  Lavonia Drafts [facility] Electronically Signed   By: Corrie Mckusick D.O.   On: 04/03/2015 15:49   Ir US Guide Vasc Access Right  04/03/2015  INDICATION: CHF EXAM: ULTRASOUND AND FLUOROSCOPIC GUIDED PICC LINE INSERTION MEDICATIONS: None CONTRAST:  None FLUOROSCOPY TIME:  60 seconds. COMPLICATIONS: None immediate TECHNIQUE: The procedure, risks, benefits, and alternatives were explained to the patient and informed written consent was obtained. A timeout was performed prior to the initiation of the procedure. The right upper extremity was prepped with chlorhexidine in a sterile fashion, and a sterile drape was applied covering the operative field. Maximum barrier sterile technique with sterile gowns and gloves were used for the procedure. A timeout was performed prior to the initiation of the procedure. Local anesthesia was provided with 1% lidocaine. Under direct ultrasound guidance, the right basilic vein was accessed with a micropuncture kit after the overlying soft tissues were anesthetized with 1% lidocaine. An ultrasound image was saved for documentation purposes. A guidewire was advanced to the level of the superior caval-atrial junction for measurement purposes and the PICC line was cut to length. A peel-away sheath was placed and a 37 cm, 5 [place], dual lumen was inserted to level of the superior caval-atrial junction. A post procedure spot  fluoroscopic was obtained. The catheter easily aspirated and flushed and was sutured in place. A dressing was placed. The patient tolerated the procedure well without immediate post procedural complication. FINDINGS: After catheter placement, the tip lies within the superior cavoatrial junction. The catheter aspirates and flushes normally and is ready for immediate use. IMPRESSION: Successful ultrasound and fluoroscopic guided placement of a right basilic vein approach, 37 cm, 5 French, dual lumen PICC with tip at the superior caval-atrial junction. The PICC line is ready for immediate use. Read by:  Lavonia Drafts [facility] Electronically Signed   By: Corrie Mckusick D.O.   On: 04/03/2015 15:49   Dg Chest Portable 1 View  04/20/2015  CLINICAL DATA:  GI bleed. EXAM: PORTABLE CHEST 1 VIEW COMPARISON:  04/12/2015 and prior exams FINDINGS: Cardiomegaly, right PICC line with tip overlying the upper SVC and small bore feeding tube identified. The feeding tube tip appears to overlie the distal esophagus. Mild right lower lung opacity may represent atelectasis or airspace disease/pneumonia. There is no evidence of pneumothorax, definite pleural effusion or pulmonary edema. Degenerative changes in the left shoulder noted. IMPRESSION: Mild right lower lung opacity -question atelectasis versus airspace disease/ pneumonia. Support apparatus as described. Small bore feeding tube tip appears to overlie the lower esophagus. Cardiomegaly. Electronically Signed   By: Margarette Canada M.D.   On: 04/20/2015 18:23   Dg Chest Port 1 View  04/12/2015  CLINICAL DATA:  J18.9 (ICD-10-CM) - PNA (pneumonia EXAM: PORTABLE CHEST 1 VIEW COMPARISON:  04/10/2015 FINDINGS: Feeding tube is in place, tip off the film and beyond the gastroesophageal junction. Right-sided PICC line tip overlies the superior vena cava. Cardiopericardial silhouette is enlarged and stable. There is persistent opacity in the lungs bases bilaterally Dr. Pauletta Browns probably not  significantly changed. IMPRESSION: 1. Persistent cardiomegaly and lung opacities most consistent with pulmonary edema. 2. Interval placement of feeding tube. Electronically Signed   By: Nolon Nations M.D.   On: 04/12/2015 14:06   Dg Chest Port 1 View  04/10/2015  CLINICAL DATA:  Acute respiratory failure EXAM: PORTABLE CHEST 1 VIEW COMPARISON:  Portable chest x-ray dated April 09, 2015 FINDINGS: The lungs are adequately inflated. Confluent  increased interstitial and alveolar opacities in the right lung are less conspicuous today. The cardiac silhouette remains enlarged. The pulmonary vascularity is indistinct. The right-sided PICC line tip projects over the proximal SVC. IMPRESSION: Slight interval improvement in interstitial infiltrate or asymmetric pulmonary edema. Stable mild cardiomegaly. Electronically Signed   By: David  Martinique M.D.   On: 04/10/2015 07:13   Dg Chest Port 1 View  04/09/2015  CLINICAL DATA:  Pneumonia. EXAM: PORTABLE CHEST 1 VIEW COMPARISON:  04/08/2015. FINDINGS: Right PICC line stable position. Persistent infiltrate right mid and right lower lung. No improvement. Left lung is clear. Stable cardiomegaly. No pulmonary venous congestion. Small right pleural effusion. No pneumothorax. Prior cervical spine fusion. IMPRESSION: 1. Right PICC line stable position. 2. Persistent right mid and lower lung infiltrate consistent with pneumonia. Small right pleural effusion. Electronically Signed   By: Marcello Moores  Register   On: 04/09/2015 07:18   Dg Chest Port 1 View  04/08/2015  CLINICAL DATA:  Pneumonia EXAM: PORTABLE CHEST 1 VIEW COMPARISON:  04/07/2015 FINDINGS: Right lower lobe infiltrate is unchanged.  Small right effusion. Cardiac enlargement without heart failure. Mild left lower lobe atelectasis/infiltrate is unchanged. PICC tip in the SVC unchanged. IMPRESSION: Right lower lobe pneumonia unchanged. Electronically Signed   By: Franchot Gallo M.D.   On: 04/08/2015 07:09   Dg Chest Port 1  View  04/07/2015  CLINICAL DATA:  Shortness of breath EXAM: PORTABLE CHEST 1 VIEW COMPARISON:  Yesterday FINDINGS: Stable cardiopericardial enlargement. Right upper extremity PICC, tip at the upper SVC. Bilateral asymmetric airspace disease, greater on the right. Probable small bilateral pleural effusion. 04/02/2015 study suggests pulmonary edema, but asymmetry raises concern for pneumonia. No air leak.Overall, no definitive change from yesterday. IMPRESSION: Stable asymmetric airspace disease, pneumonia versus asymmetric edema. Electronically Signed   By: Monte Fantasia M.D.   On: 04/07/2015 05:59   Dg Chest Port 1 View  04/05/2015  CLINICAL DATA:  Respiratory distress for 1 day EXAM: PORTABLE CHEST - 1 VIEW COMPARISON:  04/02/2015 FINDINGS: PICC line is now seen with the catheter tip in the mid superior vena cava. The cardiac shadow is stable. Bilateral infiltrates are noted in the left lung base as well as in the right mid and lower lung. No bony abnormality is seen. Postsurgical changes in the cervical spine are noted. IMPRESSION: Increasing infiltrate particularly on the right and to a lesser degree in the left lung base. Electronically Signed   By: Inez Catalina M.D.   On: 04/05/2015 20:27   Dg Chest Portable 1 View  04/02/2015  CLINICAL DATA:  Lactic acidosis. EXAM: PORTABLE CHEST 1 VIEW COMPARISON:  03/27/2015 FINDINGS: The PICC has been removed. Chronic cardiomegaly. Slight pulmonary vascular prominence without infiltrates or effusions. Slight pulmonary edema present on the prior exam has resolved. IMPRESSION: PICC line has been removed. Persistent cardiomegaly. Improved pulmonary vascular congestion. Electronically Signed   By: Lorriane Shire M.D.   On: 04/02/2015 12:32   Dg Abd Portable 1v  04/22/2015  CLINICAL DATA:  NG placement; best obtainable images; pt uncooperative diabetes. Gastroesophageal reflux disease. EXAM: PORTABLE ABDOMEN - 1 VIEW COMPARISON:  04/20/2015 FINDINGS: Two supine  portable views. Mild motion degradation. Right sided iliac stent. Feeding tube is again looped in the stomach, with tip positioned in the distal esophagus, directed cephalad. Right base airspace disease, suboptimally evaluated. Non-obstructive bowel gas pattern. IMPRESSION: Similar appearance of feeding tube, looped in the stomach with tip positioned in the distal esophagus, directed cephalad. Electronically Signed   By:  Abigail Miyamoto M.D.   On: 04/22/2015 09:06   Dg Abd Portable 1v  04/20/2015  CLINICAL DATA:  NG tube placement EXAM: PORTABLE ABDOMEN - 1 VIEW COMPARISON:  04/18/2015 FINDINGS: There is a feeding tube looped within the fundus with the metallic tip directed cranially at the gastroesophageal junction. Recommend repositioning prior to use. There is no bowel dilatation to suggest obstruction. There is no evidence of pneumoperitoneum, portal venous gas or pneumatosis. There are no pathologic calcifications along the expected course of the ureters. The osseous structures are unremarkable. IMPRESSION: There is a feeding tube looped within the fundus with the metallic tip directed cranially at the gastroesophageal junction. Recommend repositioning prior to use. Electronically Signed   By: Kathreen Devoid   On: 04/20/2015 23:48   Dg Abd Portable 1v  04/18/2015  CLINICAL DATA:  Check feeding catheter placement EXAM: PORTABLE ABDOMEN - 1 VIEW COMPARISON:  None. FINDINGS: Scattered large and small bowel gas is noted. A feeding catheter is noted coiled within the stomach. No free air is seen. IMPRESSION: Feeding catheter coiled within the stomach. Electronically Signed   By: Inez Catalina M.D.   On: 04/18/2015 16:39   Dg Abd Portable 1v  04/18/2015  CLINICAL DATA:  Vomiting. EXAM: PORTABLE ABDOMEN - 1 VIEW COMPARISON:  04/11/2015 FINDINGS: Moderately oblique attempted supine view. Feeding tube is looped in the stomach, with tip extending cephalad. No gross bowel obstruction or evidence of free  intraperitoneal air. Right common iliac vascular stent. Right hip osteoarthritis. IMPRESSION: Position degraded exam. No gross abnormality within the abdomen or pelvis. Electronically Signed   By: Abigail Miyamoto M.D.   On: 04/18/2015 12:58   Dg Abd Portable 1v  04/11/2015  CLINICAL DATA:  Feeding tube placement EXAM: PORTABLE ABDOMEN - 1 VIEW COMPARISON:  April 10, 2015 FINDINGS: Feeding tube tip is in the stomach. Bowel gas pattern normal. Lung bases clear. IMPRESSION: Feeding tube tip in stomach.  Bowel gas pattern unremarkable. Electronically Signed   By: Lowella Grip III M.D.   On: 04/11/2015 18:00   Dg Abd Portable 1v  04/10/2015  CLINICAL DATA:  Feeding tube placement. EXAM: PORTABLE ABDOMEN - 1 VIEW COMPARISON:  None. FINDINGS: The tube extends into the stomach with the tip located in the expected region of the distal antrum. Visualized bowel gas pattern is unremarkable. IMPRESSION: Feeding tube tip is located in the distal stomach at the expected level of the antrum. Electronically Signed   By: Aletta Edouard M.D.   On: 04/10/2015 14:30   Dg Abd Portable 1v  04/03/2015  CLINICAL DATA:  Vomiting.  History of diabetes. EXAM: PORTABLE ABDOMEN - 1 VIEW COMPARISON:  CT abdomen and pelvis Nov 09, 2013 FINDINGS: Paucity of bowel gas. Small amount of gas in the colon with stool density projecting at the rectum. Vascular stent projects in RIGHT Common iliac artery. No intra-abdominal mass effect or pathologic calcifications. Moderate aortoiliac vascular calcifications. 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 IMPRESSION: Nonspecific bowel gas pattern. Electronically Signed   By: Elon Alas M.D.   On: 04/03/2015 02:22   US Abdomen Limited Ruq  04/10/2015  CLINICAL DATA:  Elevated liver function tests. Previous cholecystectomy. EXAM: US ABDOMEN LIMITED - RIGHT UPPER QUADRANT COMPARISON:  04/03/2015. FINDINGS: Gallbladder: Surgically absent. Common bile duct:  Diameter: 3.9 mm Liver: Diffusely echogenic with lobulated contours and previously demonstrated adjacent free peritoneal fluid. IMPRESSION: 1. No acute abnormality. 2. Stable changes of cirrhosis of the liver with  associated ascites. Electronically Signed   By: Claudie Revering M.D.   On: 04/10/2015 14:13    Microbiology: No results found for this or any previous visit (from the past 240 hour(s)).   Labs: Basic Metabolic Panel:  Recent Labs Lab 04/18/15 0630 04/19/15 0645  04/21/15 0015 04/21/15 0500 04/22/15 0400 04/23/15 0500 04/24/15 0048  NA 152* 154*  < > 151* 152* 149* 148* 145  K 3.6 2.8*  < > 3.6 3.7 3.3* 3.3* 3.6  CL 108 107  < > 111 112* 109 110 110  CO2 32 35*  < > 33* 32 32 30 29  GLUCOSE 285* 168*  < > 77 58* 113* 201* 146*  BUN 106* 89*  < > 99* 98* 72* 51* 41*  CREATININE 1.85* 1.34*  < > 1.52* 1.75* 1.39* 1.20 1.05  CALCIUM 8.1* 8.3*  < > 8.1* 8.3* 8.1* 8.0* 7.9*  MG 2.2 2.0  --   --   --   --   --   --   PHOS 4.6 3.7  --   --   --   --   --   --   < > = values in this interval not displayed. Liver Function Tests:  Recent Labs Lab 04/20/15 1544 04/20/15 1841 04/21/15 0500 04/22/15 0400 04/23/15 0500  AST 95* 105* 98* 74* 57*  ALT 80* 84* 81* 69* 58  ALKPHOS 911* 941* 935* 1015* 981*  BILITOT 3.3* 3.4* 3.5* 3.7* 3.6*  PROT 5.2* 5.6* 6.5 5.9* 5.6*  ALBUMIN 1.5* 1.7* 1.8* 1.7* 1.7*   No results for input(s): LIPASE, AMYLASE in the last 168 hours.  Recent Labs Lab 04/20/15 1841 04/22/15 0434  AMMONIA 44* 32   CBC:  Recent Labs Lab 04/18/15 0630  04/20/15 1841  04/21/15 0500 04/21/15 1800 04/22/15 0400 04/23/15 0500 04/24/15 0048  WBC 17.1*  < > 14.3*  --  16.6* 14.5* 13.4* 10.3 10.0  NEUTROABS 14.5*  --  11.6*  --   --   --   --   --   --   HGB 7.4*  < > 9.7*  < > 9.9* 9.3* 9.2* 9.2* 9.1*  HCT 26.8*  < > 30.8*  < > 31.4* 30.5* 30.4* 31.0* 31.1*  MCV 72.6*  < > 76.8*  --  77.3* 78.6 80.0 81.6 80.6  PLT 168  < > 108*  --  110* 107* 102* 95*  112*  < > = values in this interval not displayed. Cardiac Enzymes: No results for input(s): CKTOTAL, CKMB, CKMBINDEX, TROPONINI in the last 168 hours. BNP: BNP (last 3 results)  Recent Labs  03/21/15 1200 04/02/15 1032 04/13/15 0709  BNP 824.5* 1932.0* 3193.2*    ProBNP (last 3 results) No results for input(s): PROBNP in the last 8760 hours.  CBG:  Recent Labs Lab 04/23/15 0501 04/23/15 0745 04/23/15 1225 04/23/15 1707 04/23/15 2119  GLUCAP 174* 186* 216* 302* 145*       Signed:  Dia Crawford, MD [facility] [phone] pager

## 2015-04-25 ENCOUNTER — Encounter (HOSPITAL_COMMUNITY): Payer: Self-pay | Admitting: Gastroenterology

## 2015-04-25 LAB — CBC WITH DIFFERENTIAL/PLATELET
Basophils Absolute: 0 10*3/uL (ref 0.0–0.1)
Basophils Relative: 0 %
Eosinophils Absolute: 0.1 10*3/uL (ref 0.0–0.7)
Eosinophils Relative: 1 %
HCT: 30.6 % — ABNORMAL LOW (ref 39.0–52.0)
Hemoglobin: 9.2 g/dL — ABNORMAL LOW (ref 13.0–17.0)
Lymphocytes Relative: 14 %
Lymphs Abs: 1.3 10*3/uL (ref 0.7–4.0)
MCH: 24.3 pg — ABNORMAL LOW (ref 26.0–34.0)
MCHC: 30.1 g/dL (ref 30.0–36.0)
MCV: 81 fL (ref 78.0–100.0)
Monocytes Absolute: 0.6 10*3/uL (ref 0.1–1.0)
Monocytes Relative: 6 %
Neutro Abs: 7.2 10*3/uL (ref 1.7–7.7)
Neutrophils Relative %: 79 %
Platelets: 100 10*3/uL — ABNORMAL LOW (ref 150–400)
RBC: 3.78 MIL/uL — ABNORMAL LOW (ref 4.22–5.81)
RDW: 28.2 % — ABNORMAL HIGH (ref 11.5–15.5)
WBC: 9.2 10*3/uL (ref 4.0–10.5)

## 2015-04-25 LAB — BASIC METABOLIC PANEL
Anion gap: 10 (ref 5–15)
BUN: 31 mg/dL — AB (ref 6–20)
CHLORIDE: 107 mmol/L (ref 101–111)
CO2: 29 mmol/L (ref 22–32)
CREATININE: 0.95 mg/dL (ref 0.61–1.24)
Calcium: 8.3 mg/dL — ABNORMAL LOW (ref 8.9–10.3)
GFR calc Af Amer: >60 mL/min (ref >60)
GFR calc non Af Amer: >60 mL/min (ref >60)
GLUCOSE: 148 mg/dL — AB (ref 65–99)
Potassium: 3.7 mmol/L (ref 3.5–5.1)
SODIUM: 146 mmol/L — AB (ref 135–145)

## 2015-04-25 LAB — AMMONIA: AMMONIA: 31 umol/L (ref 9–35)

## 2015-04-27 ENCOUNTER — Encounter: Payer: Self-pay | Admitting: Vascular Surgery

## 2015-04-28 LAB — RENAL FUNCTION PANEL
Albumin: 1.8 g/dL — ABNORMAL LOW (ref 3.5–5.0)
Anion gap: 6 (ref 5–15)
BUN: 23 mg/dL — ABNORMAL HIGH (ref 6–20)
CALCIUM: 7.8 mg/dL — AB (ref 8.9–10.3)
CO2: 28 mmol/L (ref 22–32)
CREATININE: 1.09 mg/dL (ref 0.61–1.24)
Chloride: 101 mmol/L (ref 101–111)
Glucose, Bld: 110 mg/dL — ABNORMAL HIGH (ref 65–99)
PHOSPHORUS: 3.3 mg/dL (ref 2.5–4.6)
Potassium: 4.1 mmol/L (ref 3.5–5.1)
SODIUM: 135 mmol/L (ref 135–145)

## 2015-04-28 LAB — CBC WITH DIFFERENTIAL/PLATELET
BASOS ABS: 0 10*3/uL (ref 0.0–0.1)
BASOS PCT: 0 %
EOS ABS: 0.1 10*3/uL (ref 0.0–0.7)
Eosinophils Relative: 1 %
HCT: 28.2 % — ABNORMAL LOW (ref 39.0–52.0)
HEMOGLOBIN: 8.5 g/dL — AB (ref 13.0–17.0)
LYMPHS PCT: 21 %
Lymphs Abs: 1.9 10*3/uL (ref 0.7–4.0)
MCH: 23.9 pg — AB (ref 26.0–34.0)
MCHC: 30.1 g/dL (ref 30.0–36.0)
MCV: 79.4 fL (ref 78.0–100.0)
Monocytes Absolute: 0.6 10*3/uL (ref 0.1–1.0)
Monocytes Relative: 7 %
NEUTROS PCT: 71 %
Neutro Abs: 6.3 10*3/uL (ref 1.7–7.7)
Platelets: 124 10*3/uL — ABNORMAL LOW (ref 150–400)
RBC: 3.55 MIL/uL — ABNORMAL LOW (ref 4.22–5.81)
RDW: 26.4 % — ABNORMAL HIGH (ref 11.5–15.5)
WBC: 8.9 10*3/uL (ref 4.0–10.5)

## 2015-04-28 LAB — MAGNESIUM: MAGNESIUM: 1.7 mg/dL (ref 1.7–2.4)

## 2015-05-01 ENCOUNTER — Encounter: Payer: Medicare Other | Admitting: Vascular Surgery

## 2015-05-01 ENCOUNTER — Encounter (HOSPITAL_COMMUNITY): Payer: Medicare Other

## 2015-05-03 LAB — COMPREHENSIVE METABOLIC PANEL
ALT: 30 U/L (ref 17–63)
AST: 22 U/L (ref 15–41)
Albumin: 1.7 g/dL — ABNORMAL LOW (ref 3.5–5.0)
Alkaline Phosphatase: 978 U/L — ABNORMAL HIGH (ref 38–126)
Anion gap: 9 (ref 5–15)
BILIRUBIN TOTAL: 2.3 mg/dL — AB (ref 0.3–1.2)
BUN: 21 mg/dL — ABNORMAL HIGH (ref 6–20)
CHLORIDE: 99 mmol/L — AB (ref 101–111)
CO2: 28 mmol/L (ref 22–32)
CREATININE: 0.95 mg/dL (ref 0.61–1.24)
Calcium: 7.6 mg/dL — ABNORMAL LOW (ref 8.9–10.3)
Glucose, Bld: 201 mg/dL — ABNORMAL HIGH (ref 65–99)
POTASSIUM: 2.4 mmol/L — AB (ref 3.5–5.1)
Sodium: 136 mmol/L (ref 135–145)
TOTAL PROTEIN: 5.3 g/dL — AB (ref 6.5–8.1)

## 2015-05-03 LAB — CBC WITH DIFFERENTIAL/PLATELET
BASOS PCT: 0 %
Basophils Absolute: 0 10*3/uL (ref 0.0–0.1)
EOS PCT: 1 %
Eosinophils Absolute: 0.1 10*3/uL (ref 0.0–0.7)
HCT: 27.8 % — ABNORMAL LOW (ref 39.0–52.0)
Hemoglobin: 8.3 g/dL — ABNORMAL LOW (ref 13.0–17.0)
LYMPHS ABS: 1.7 10*3/uL (ref 0.7–4.0)
Lymphocytes Relative: 20 %
MCH: 23.2 pg — AB (ref 26.0–34.0)
MCHC: 29.9 g/dL — ABNORMAL LOW (ref 30.0–36.0)
MCV: 77.9 fL — AB (ref 78.0–100.0)
MONO ABS: 0.7 10*3/uL (ref 0.1–1.0)
Monocytes Relative: 8 %
NEUTROS ABS: 5.9 10*3/uL (ref 1.7–7.7)
Neutrophils Relative %: 71 %
PLATELETS: 170 10*3/uL (ref 150–400)
RBC: 3.57 MIL/uL — ABNORMAL LOW (ref 4.22–5.81)
RDW: 24.7 % — AB (ref 11.5–15.5)
WBC: 8.4 10*3/uL (ref 4.0–10.5)

## 2015-05-03 LAB — PHOSPHORUS: PHOSPHORUS: 3.4 mg/dL (ref 2.5–4.6)

## 2015-05-03 LAB — MAGNESIUM: MAGNESIUM: 1.1 mg/dL — AB (ref 1.7–2.4)

## 2015-05-03 LAB — AMMONIA: Ammonia: 44 umol/L — ABNORMAL HIGH (ref 9–35)

## 2015-05-04 LAB — POTASSIUM: POTASSIUM: 3.1 mmol/L — AB (ref 3.5–5.1)

## 2015-05-05 LAB — POTASSIUM: Potassium: 3.1 mmol/L — ABNORMAL LOW (ref 3.5–5.1)

## 2015-05-06 LAB — POTASSIUM: Potassium: 3.2 mmol/L — ABNORMAL LOW (ref 3.5–5.1)

## 2015-05-06 LAB — MAGNESIUM: MAGNESIUM: 1.6 mg/dL — AB (ref 1.7–2.4)

## 2015-05-07 LAB — BASIC METABOLIC PANEL
Anion gap: 9 (ref 5–15)
BUN: 34 mg/dL — AB (ref 6–20)
CALCIUM: 8 mg/dL — AB (ref 8.9–10.3)
CHLORIDE: 102 mmol/L (ref 101–111)
CO2: 25 mmol/L (ref 22–32)
CREATININE: 1.57 mg/dL — AB (ref 0.61–1.24)
GFR, EST AFRICAN AMERICAN: 56 mL/min — AB (ref >60)
GFR, EST NON AFRICAN AMERICAN: 48 mL/min — AB (ref >60)
Glucose, Bld: 194 mg/dL — ABNORMAL HIGH (ref 65–99)
Potassium: 4.4 mmol/L (ref 3.5–5.1)
SODIUM: 136 mmol/L (ref 135–145)

## 2015-05-07 LAB — MAGNESIUM: MAGNESIUM: 2 mg/dL (ref 1.7–2.4)

## 2015-05-07 LAB — DIGOXIN LEVEL: Digoxin Level: 1.5 ng/mL (ref 0.8–2.0)

## 2015-05-07 LAB — PHOSPHORUS: PHOSPHORUS: 4 mg/dL (ref 2.5–4.6)

## 2015-05-10 ENCOUNTER — Encounter: Payer: Self-pay | Admitting: Internal Medicine

## 2015-05-10 ENCOUNTER — Non-Acute Institutional Stay (SKILLED_NURSING_FACILITY): Payer: Medicare Other | Admitting: Internal Medicine

## 2015-05-10 DIAGNOSIS — R531 Weakness: Secondary | ICD-10-CM

## 2015-05-10 DIAGNOSIS — F1010 Alcohol abuse, uncomplicated: Secondary | ICD-10-CM | POA: Diagnosis not present

## 2015-05-10 DIAGNOSIS — E1122 Type 2 diabetes mellitus with diabetic chronic kidney disease: Secondary | ICD-10-CM | POA: Diagnosis not present

## 2015-05-10 DIAGNOSIS — E1165 Type 2 diabetes mellitus with hyperglycemia: Secondary | ICD-10-CM | POA: Diagnosis not present

## 2015-05-10 DIAGNOSIS — I739 Peripheral vascular disease, unspecified: Secondary | ICD-10-CM

## 2015-05-10 DIAGNOSIS — K7210 Chronic hepatic failure without coma: Secondary | ICD-10-CM

## 2015-05-10 DIAGNOSIS — G9340 Encephalopathy, unspecified: Secondary | ICD-10-CM

## 2015-05-10 DIAGNOSIS — F1011 Alcohol abuse, in remission: Secondary | ICD-10-CM

## 2015-05-10 DIAGNOSIS — Z794 Long term (current) use of insulin: Secondary | ICD-10-CM

## 2015-05-10 DIAGNOSIS — N183 Chronic kidney disease, stage 3 (moderate): Secondary | ICD-10-CM | POA: Diagnosis not present

## 2015-05-10 DIAGNOSIS — K2210 Ulcer of esophagus without bleeding: Secondary | ICD-10-CM

## 2015-05-10 DIAGNOSIS — IMO0002 Reserved for concepts with insufficient information to code with codable children: Secondary | ICD-10-CM

## 2015-05-10 DIAGNOSIS — F418 Other specified anxiety disorders: Secondary | ICD-10-CM

## 2015-05-10 NOTE — Progress Notes (Signed)
Patient ID: Alfonzo Arca, male   DOB: 06-11-1961, 54 y.o.   MRN: [id number]    HISTORY AND PHYSICAL   DATE:  05/10/15  Location:  [facility]    Place of Service: SNF (31)   Extended Emergency Contact Information Primary Emergency Contact: Vanderweide,Vickie Address: [place], [place] [address] [place] Phone: [phone] Relation: Spouse Secondary Emergency Contact: Henderson,Evelyn  [place] Phone: [phone] Relation: Mother  Advanced Directive information  FULL CODE  Chief Complaint  Patient presents with  . New Admit To SNF    HPI:  54 yo male seen today as a new admission into SNF following hospital stay for hepatic encephalopathy, Etoh abuse hx, end stage CHF, generalized weakness, moderate protein calorie malnutrition, esophageal ulcers/p GI bleed, PAD with hx right CFA stent, liver cirrhosis with ascites, depression and anxiety, thrombocytopenia and medical noncompliance. He actually transferred from [facility]. While at [facility], he developed C diff colitis and has completed tx. He also rec'd 3 units PRBCs for GI bleed. Feeding tube removed due to malpositioning. He presents for short term rehab.  Today, he c/o weakness but states confusion has improved. Appetite remains reduced. He still has loose stools. No f/c. No abdominal pain.   DM - insulin dependent. A1c 7.6%. No low BS reactions  GERD - he is now on BID PPI + carafate.  Depression/anxiety - stable  Chronic systolic HF - end stage per CHF team. EF 15-20% with severe RV dysfunction. Currently on digoxin. Uses HFA prn  Liver cirrhosis - no longer drinks Etoh >10 yrs. He is on rifaximin  HTN - stable  Past Medical History  Diagnosis Date  . Diabetes mellitus 2008  . 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   . Depression   . GERD (gastroesophageal reflux disease)   . Peripheral  neuropathy ([place]) 2001  . Hypercholesteremia   . Hx of tracheostomy ([place])   . Chronic systolic CHF (congestive heart failure), NYHA class 3 ([place])     end stage as of 03/2015.  "Home"/SNF milrinone dc'd 03/2015.   Marland Kitchen Cirrhosis of liver ([place]) 03/2015    likely due to ETOH.   . Chronic calcific pancreatitis ([place]) 2001    ETOH induced.   Marland Kitchen Dysphagia 03/2015  . Peripheral vascular disease ([place])     03/2015 right common iliac stent.   Marland Kitchen AKI (acute kidney injury) ([place]) 11/2014  . HTN (hypertension) 2008  . Foot drop, bilateral 2001    occured along with peripheral neuropathy during bout with acute pancreatitis  . Hematemesis 04/2015    EGD found erosive esophagitis due to feeding tube coiled in esophagus.   . Anemia     of chronic disease, ABL anemia 04/2015 requiring PRBC transfusion   . Candida esophagitis ([place]) 11/2013    Past Surgical History  Procedure Laterality Date  . Pancreas surgery  2001    hx pancreatitis  . Anterior cervical decomp/discectomy fusion  04/13/2012    Procedure: ANTERIOR CERVICAL DECOMPRESSION/DISCECTOMY FUSION 1 LEVEL;  Surgeon: Charlie Pitter, MD;  Location: [facility];  Service: Neurosurgery;  Laterality: N/A;  Cervcial Five-Six Anterior Cervical Decompression and Fusion with Allograft and Plating  . Cardiac catheterization N/A 11/15/2014    Procedure: Right Heart Cath;  Surgeon: Jolaine Artist, MD;  Location: [facility];  Service: Cardiovascular;  Laterality: N/A;  . Peripheral vascular catheterization N/A 02/26/2015    Procedure: Abdominal Aortogram;  Surgeon: Angelia Mould, MD;  Location: [facility];  Service: Cardiovascular;  Laterality: N/A;  . Incise and drain abcess  12/2007    scrotum  . Esophagogastroduodenoscopy  11/2013    HH, candida esophagitis  . Esophagogastroduodenoscopy (egd) with propofol N/A 04/21/2015    Procedure: ESOPHAGOGASTRODUODENOSCOPY (EGD) WITH PROPOFOL;  Surgeon: Mauri Pole, MD;  Location: [facility];   Service: Endoscopy;  Laterality: N/A;    Patient Care Team: Nolene Ebbs, MD as PCP - General (Internal Medicine)  Social History   Social History  . Marital Status: Married    Spouse Name: N/A  . Number of Children: N/A  . Years of Education: N/A   Occupational History  . disabled    Social History Main Topics  . Smoking status: Current Every Day Smoker -- 1.00 packs/day for 30 years    Types: Cigarettes  . Smokeless tobacco: Never Used  . Alcohol Use: No     Comment: Quit after his brother's death in August 24, 2005 or 8   . Drug Use: No  . Sexual Activity: Yes   Other Topics Concern  . Not on file   Social History Narrative   Lives in [place] with wife, uses crutches or wheelchair to get around     reports that he has been smoking Cigarettes.  He has a 30 pack-year smoking history. He has never used smokeless tobacco. He reports that he does not drink alcohol or use illicit drugs.  Family History  Problem Relation Age of Onset  . Diabetes Mellitus II Sister   . CAD Brother   . Cancer Mother     ? type   . Cancer Father     ? type   Family Status  Relation Status Death Age  . Mother Deceased   . Father Deceased     Immunization History  Administered Date(s) Administered  . Pneumococcal Polysaccharide-23 11/11/2013    No Known Allergies  Medications: Patient's Medications  New Prescriptions   No medications on file  Previous Medications   ATORVASTATIN (LIPITOR) 40 MG TABLET    Take 1 tablet (40 mg total) by mouth daily at 6 PM.   DIGOXIN (LANOXIN) 0.125 MG TABLET    Take 1 tablet (0.125 mg total) by mouth daily.   FEEDING SUPPLEMENT, ENSURE ENLIVE, (ENSURE ENLIVE) LIQD    Take 237 mLs by mouth 3 (three) times daily between meals.   FERROUS SULFATE 325 (65 FE) MG EC TABLET    Take 1 tablet (325 mg total) by mouth daily with breakfast.   INSULIN ASPART (NOVOLOG) 100 UNIT/ML INJECTION    Inject 0-9 Units into the skin 3 (three) times daily with meals.    INSULIN GLARGINE (LANTUS) 100 UNIT/ML INJECTION    Inject 0.1 mLs (10 Units total) into the skin daily.   PANTOPRAZOLE (PROTONIX) 40 MG TABLET    Take 1 tablet (40 mg total) by mouth 2 (two) times daily.   RIFAXIMIN (XIFAXAN) 550 MG TABS TABLET    Take 1 tablet (550 mg total) by mouth 2 (two) times daily.   SUCRALFATE (CARAFATE) 1 G TABLET    Take 1 tablet (1 g total) by mouth 4 (four) times daily -  with meals and at bedtime.   VENTOLIN HFA 108 (90 BASE) MCG/ACT INHALER    Inhale 1-2 puffs into the lungs every 6 (six) hours as needed for wheezing or  shortness of breath.   Modified Medications   No medications on file  Discontinued Medications   No medications on file    Review of Systems  Unable to perform ROS: Other  encephalopathy  Filed Vitals:   05/10/15 1437  BP: 131/59  Pulse: 63  Temp: 96.3 F (35.7 C)   There is no weight on file to calculate BMI.  Physical Exam  Constitutional: He appears well-developed. No distress.  Frail appearing in NAD. Wife present  HENT:  Mouth/Throat: Oropharynx is clear and moist.  Eyes: Pupils are equal, round, and reactive to light. No scleral icterus.  Neck: Neck supple. Carotid bruit is not present.  Cardiovascular: Normal rate, regular rhythm, normal heart sounds and intact distal pulses.  Exam reveals no gallop and no friction rub.   No murmur heard. +1 pitting LE edema b/l. No calf TTP  Pulmonary/Chest: Effort normal and breath sounds normal. He has no wheezes. He has no rales. He exhibits no tenderness.  Abdominal: Soft. Bowel sounds are normal. He exhibits no distension, no abdominal bruit, no pulsatile midline mass and no mass. There is no tenderness. There is no rebound and no guarding.  Lymphadenopathy:    He has no cervical adenopathy.  Neurological: He is alert.  Skin: Skin is warm and dry. No rash noted.  Large, TTP carbuncles vs buboes on back. No redness or d/c. Central black eschar  Psychiatric: He has a normal mood and  affect. His behavior is normal.     Labs reviewed: Admission on 04/24/2015, Discharged on 05/08/2015  Component Date Value Ref Range Status  . WBC 04/25/2015 9.2  4.0 - 10.5 K/uL Final  . RBC 04/25/2015 3.78* 4.22 - 5.81 MIL/uL Final  . Hemoglobin 04/25/2015 9.2* 13.0 - 17.0 g/dL Final  . HCT 04/25/2015 30.6* 39.0 - 52.0 % Final  . MCV 04/25/2015 81.0  78.0 - 100.0 fL Final  . MCH 04/25/2015 24.3* 26.0 - 34.0 pg Final  . MCHC 04/25/2015 30.1  30.0 - 36.0 g/dL Final  . RDW 04/25/2015 28.2* 11.5 - 15.5 % Final  . Platelets 04/25/2015 100* 150 - 400 K/uL Final   Comment: SPECIMEN CHECKED FOR CLOTS PLATELET COUNT CONFIRMED BY SMEAR   . Neutrophils Relative % 04/25/2015 79   Final  . Lymphocytes Relative 04/25/2015 14   Final  . Monocytes Relative 04/25/2015 6   Final  . Eosinophils Relative 04/25/2015 1   Final  . Basophils Relative 04/25/2015 0   Final  . Neutro Abs 04/25/2015 7.2  1.7 - 7.7 K/uL Final  . Lymphs Abs 04/25/2015 1.3  0.7 - 4.0 K/uL Final  . Monocytes Absolute 04/25/2015 0.6  0.1 - 1.0 K/uL Final  . Eosinophils Absolute 04/25/2015 0.1  0.0 - 0.7 K/uL Final  . Basophils Absolute 04/25/2015 0.0  0.0 - 0.1 K/uL Final  . RBC Morphology 04/25/2015 POLYCHROMASIA PRESENT   Final  . Sodium 04/25/2015 146* 135 - 145 mmol/L Final  . Potassium 04/25/2015 3.7  3.5 - 5.1 mmol/L Final  . Chloride 04/25/2015 107  101 - 111 mmol/L Final  . CO2 04/25/2015 29  22 - 32 mmol/L Final  . Glucose, Bld 04/25/2015 148* 65 - 99 mg/dL Final  . BUN 04/25/2015 31* 6 - 20 mg/dL Final  . Creatinine, Ser 04/25/2015 0.95  0.61 - 1.24 mg/dL Final  . Calcium 04/25/2015 8.3* 8.9 - 10.3 mg/dL Final  . GFR calc non Af Amer 04/25/2015 >60  >60 mL/min Final  . GFR calc Af  Amer 04/25/2015 >60  >60 mL/min Final   Comment: (NOTE) The eGFR has been calculated using the CKD EPI equation. This calculation has not been validated in all clinical situations. eGFR's persistently <60 mL/min signify possible  Chronic Kidney Disease.   . Anion gap 04/25/2015 10  5 - 15 Final  . Ammonia 04/25/2015 31  9 - 35 umol/L Final  . WBC 04/28/2015 8.9  4.0 - 10.5 K/uL Final  . RBC 04/28/2015 3.55* 4.22 - 5.81 MIL/uL Final  . Hemoglobin 04/28/2015 8.5* 13.0 - 17.0 g/dL Final  . HCT 04/28/2015 28.2* 39.0 - 52.0 % Final  . MCV 04/28/2015 79.4  78.0 - 100.0 fL Final  . MCH 04/28/2015 23.9* 26.0 - 34.0 pg Final  . MCHC 04/28/2015 30.1  30.0 - 36.0 g/dL Final  . RDW 04/28/2015 26.4* 11.5 - 15.5 % Final  . Platelets 04/28/2015 124* 150 - 400 K/uL Final  . Neutrophils Relative % 04/28/2015 71   Final  . Lymphocytes Relative 04/28/2015 21   Final  . Monocytes Relative 04/28/2015 7   Final  . Eosinophils Relative 04/28/2015 1   Final  . Basophils Relative 04/28/2015 0   Final  . Neutro Abs 04/28/2015 6.3  1.7 - 7.7 K/uL Final  . Lymphs Abs 04/28/2015 1.9  0.7 - 4.0 K/uL Final  . Monocytes Absolute 04/28/2015 0.6  0.1 - 1.0 K/uL Final  . Eosinophils Absolute 04/28/2015 0.1  0.0 - 0.7 K/uL Final  . Basophils Absolute 04/28/2015 0.0  0.0 - 0.1 K/uL Final  . RBC Morphology 04/28/2015 POLYCHROMASIA PRESENT   Final   TARGET CELLS  . Magnesium 04/28/2015 1.7  1.7 - 2.4 mg/dL Final  . Sodium 04/28/2015 135  135 - 145 mmol/L Final  . Potassium 04/28/2015 4.1  3.5 - 5.1 mmol/L Final  . Chloride 04/28/2015 101  101 - 111 mmol/L Final  . CO2 04/28/2015 28  22 - 32 mmol/L Final  . Glucose, Bld 04/28/2015 110* 65 - 99 mg/dL Final  . BUN 04/28/2015 23* 6 - 20 mg/dL Final  . Creatinine, Ser 04/28/2015 1.09  0.61 - 1.24 mg/dL Final  . Calcium 04/28/2015 7.8* 8.9 - 10.3 mg/dL Final  . Phosphorus 04/28/2015 3.3  2.5 - 4.6 mg/dL Final  . Albumin 04/28/2015 1.8* 3.5 - 5.0 g/dL Final  . GFR calc non Af Amer 04/28/2015 >60  >60 mL/min Final  . GFR calc Af Amer 04/28/2015 >60  >60 mL/min Final   Comment: (NOTE) The eGFR has been calculated using the CKD EPI equation. This calculation has not been validated in all clinical  situations. eGFR's persistently <60 mL/min signify possible Chronic Kidney Disease.   . Anion gap 04/28/2015 6  5 - 15 Final  . WBC 05/03/2015 8.4  4.0 - 10.5 K/uL Final  . RBC 05/03/2015 3.57* 4.22 - 5.81 MIL/uL Final  . Hemoglobin 05/03/2015 8.3* 13.0 - 17.0 g/dL Final  . HCT 05/03/2015 27.8* 39.0 - 52.0 % Final  . MCV 05/03/2015 77.9* 78.0 - 100.0 fL Final  . MCH 05/03/2015 23.2* 26.0 - 34.0 pg Final  . MCHC 05/03/2015 29.9* 30.0 - 36.0 g/dL Final  . RDW 05/03/2015 24.7* 11.5 - 15.5 % Final  . Platelets 05/03/2015 170  150 - 400 K/uL Final  . Neutrophils Relative % 05/03/2015 71   Final  . Lymphocytes Relative 05/03/2015 20   Final  . Monocytes Relative 05/03/2015 8   Final  . Eosinophils Relative 05/03/2015 1   Final  . Basophils Relative  05/03/2015 0   Final  . Neutro Abs 05/03/2015 5.9  1.7 - 7.7 K/uL Final  . Lymphs Abs 05/03/2015 1.7  0.7 - 4.0 K/uL Final  . Monocytes Absolute 05/03/2015 0.7  0.1 - 1.0 K/uL Final  . Eosinophils Absolute 05/03/2015 0.1  0.0 - 0.7 K/uL Final  . Basophils Absolute 05/03/2015 0.0  0.0 - 0.1 K/uL Final  . RBC Morphology 05/03/2015 TARGET CELLS   Final  . Sodium 05/03/2015 136  135 - 145 mmol/L Final  . Potassium 05/03/2015 2.4* 3.5 - 5.1 mmol/L Final   Comment: REPEATED TO VERIFY CRITICAL RESULT CALLED TO, READ BACK BY AND VERIFIED WITH: JEFFERS,S RN @ 0800 05/03/15 LEONARD,A   . Chloride 05/03/2015 99* 101 - 111 mmol/L Final  . CO2 05/03/2015 28  22 - 32 mmol/L Final  . Glucose, Bld 05/03/2015 201* 65 - 99 mg/dL Final  . BUN 05/03/2015 21* 6 - 20 mg/dL Final  . Creatinine, Ser 05/03/2015 0.95  0.61 - 1.24 mg/dL Final  . Calcium 05/03/2015 7.6* 8.9 - 10.3 mg/dL Final  . Total Protein 05/03/2015 5.3* 6.5 - 8.1 g/dL Final  . Albumin 05/03/2015 1.7* 3.5 - 5.0 g/dL Final  . AST 05/03/2015 22  15 - 41 U/L Final  . ALT 05/03/2015 30  17 - 63 U/L Final  . Alkaline Phosphatase 05/03/2015 978* 38 - 126 U/L Final  . Total Bilirubin 05/03/2015 2.3*  0.3 - 1.2 mg/dL Final  . GFR calc non Af Amer 05/03/2015 >60  >60 mL/min Final  . GFR calc Af Amer 05/03/2015 >60  >60 mL/min Final   Comment: (NOTE) The eGFR has been calculated using the CKD EPI equation. This calculation has not been validated in all clinical situations. eGFR's persistently <60 mL/min signify possible Chronic Kidney Disease.   . Anion gap 05/03/2015 9  5 - 15 Final  . Magnesium 05/03/2015 1.1* 1.7 - 2.4 mg/dL Final  . Phosphorus 05/03/2015 3.4  2.5 - 4.6 mg/dL Final  . Ammonia 05/03/2015 44* 9 - 35 umol/L Final  . Potassium 05/04/2015 3.1* 3.5 - 5.1 mmol/L Final   DELTA CHECK NOTED  . Potassium 05/05/2015 3.1* 3.5 - 5.1 mmol/L Final  . Potassium 05/06/2015 3.2* 3.5 - 5.1 mmol/L Final  . Magnesium 05/06/2015 1.6* 1.7 - 2.4 mg/dL Final  . Sodium 05/07/2015 136  135 - 145 mmol/L Final  . Potassium 05/07/2015 4.4  3.5 - 5.1 mmol/L Final  . Chloride 05/07/2015 102  101 - 111 mmol/L Final  . CO2 05/07/2015 25  22 - 32 mmol/L Final  . Glucose, Bld 05/07/2015 194* 65 - 99 mg/dL Final  . BUN 05/07/2015 34* 6 - 20 mg/dL Final  . Creatinine, Ser 05/07/2015 1.57* 0.61 - 1.24 mg/dL Final  . Calcium 05/07/2015 8.0* 8.9 - 10.3 mg/dL Final  . GFR calc non Af Amer 05/07/2015 48* >60 mL/min Final  . GFR calc Af Amer 05/07/2015 56* >60 mL/min Final   Comment: (NOTE) The eGFR has been calculated using the CKD EPI equation. This calculation has not been validated in all clinical situations. eGFR's persistently <60 mL/min signify possible Chronic Kidney Disease.   . Anion gap 05/07/2015 9  5 - 15 Final  . Magnesium 05/07/2015 2.0  1.7 - 2.4 mg/dL Final  . Phosphorus 05/07/2015 4.0  2.5 - 4.6 mg/dL Final  . Digoxin Level 05/07/2015 1.5  0.8 - 2.0 ng/mL Final  Admission on 04/20/2015, Discharged on 04/24/2015  No results displayed because visit has over 200 results.  Admission on 04/11/2015, Discharged on 04/20/2015  No results displayed because visit has over 200 results.      Admission on 04/02/2015, Discharged on 04/11/2015  No results displayed because visit has over 200 results.    Admission on 03/21/2015, Discharged on 03/29/2015  No results displayed because visit has over 200 results.    Admission on 03/11/2015, Discharged on 03/13/2015  Component Date Value Ref Range Status  . Sodium 03/11/2015 136  135 - 145 mmol/L Final  . Potassium 03/11/2015 3.4* 3.5 - 5.1 mmol/L Final  . Chloride 03/11/2015 94* 101 - 111 mmol/L Final  . CO2 03/11/2015 34* 22 - 32 mmol/L Final  . Glucose, Bld 03/11/2015 21* 65 - 99 mg/dL Final   Comment: RESULT REPEATED AND VERIFIED CRITICAL RESULT CALLED TO, READ BACK BY AND VERIFIED WITH: RN THOMPSON,K AT [id number] MARTINB   . BUN 03/11/2015 43* 6 - 20 mg/dL Final  . Creatinine, Ser 03/11/2015 1.58* 0.61 - 1.24 mg/dL Final  . Calcium 03/11/2015 8.9  8.9 - 10.3 mg/dL Final  . Total Protein 03/11/2015 7.1  6.5 - 8.1 g/dL Final  . Albumin 03/11/2015 2.7* 3.5 - 5.0 g/dL Final  . AST 03/11/2015 56* 15 - 41 U/L Final  . ALT 03/11/2015 34  17 - 63 U/L Final  . Alkaline Phosphatase 03/11/2015 413* 38 - 126 U/L Final  . Total Bilirubin 03/11/2015 1.5* 0.3 - 1.2 mg/dL Final  . GFR calc non Af Amer 03/11/2015 48* >60 mL/min Final  . GFR calc Af Amer 03/11/2015 56* >60 mL/min Final   Comment: (NOTE) The eGFR has been calculated using the CKD EPI equation. This calculation has not been validated in all clinical situations. eGFR's persistently <60 mL/min signify possible Chronic Kidney Disease.   . Anion gap 03/11/2015 8  5 - 15 Final  . WBC 03/11/2015 7.3  4.0 - 10.5 K/uL Final  . RBC 03/11/2015 4.45  4.22 - 5.81 MIL/uL Final  . Hemoglobin 03/11/2015 9.7* 13.0 - 17.0 g/dL Final  . HCT 03/11/2015 31.1* 39.0 - 52.0 % Final  . MCV 03/11/2015 69.9* 78.0 - 100.0 fL Final  . MCH 03/11/2015 21.8* 26.0 - 34.0 pg Final  . MCHC 03/11/2015 31.2  30.0 - 36.0 g/dL Final  . RDW 03/11/2015 19.4* 11.5 - 15.5 % Final  . Platelets  03/11/2015 286  150 - 400 K/uL Final  . Neutrophils Relative % 03/11/2015 85* 43 - 77 % Final  . Neutro Abs 03/11/2015 6.2  1.7 - 7.7 K/uL Final  . Lymphocytes Relative 03/11/2015 6* 12 - 46 % Final  . Lymphs Abs 03/11/2015 0.5* 0.7 - 4.0 K/uL Final  . Monocytes Relative 03/11/2015 7  3 - 12 % Final  . Monocytes Absolute 03/11/2015 0.5  0.1 - 1.0 K/uL Final  . Eosinophils Relative 03/11/2015 1  0 - 5 % Final  . Eosinophils Absolute 03/11/2015 0.1  0.0 - 0.7 K/uL Final  . Basophils Relative 03/11/2015 1  0 - 1 % Final  . Basophils Absolute 03/11/2015 0.0  0.0 - 0.1 K/uL Final  . Troponin I 03/11/2015 0.06* <0.031 ng/mL Final   Comment:        PERSISTENTLY INCREASED TROPONIN VALUES IN THE RANGE OF 0.04-0.49 ng/mL CAN BE SEEN IN:       -UNSTABLE ANGINA       -CONGESTIVE HEART FAILURE       -MYOCARDITIS       -CHEST TRAUMA       -ARRYHTHMIAS       -  LATE PRESENTING MYOCARDIAL INFARCTION       -COPD   CLINICAL FOLLOW-UP RECOMMENDED.   . Digoxin Level 03/11/2015 1.0  0.8 - 2.0 ng/mL Final  . pH, Arterial 03/11/2015 7.439  7.350 - 7.450 Final  . pCO2 arterial 03/11/2015 49.5* 35.0 - 45.0 mmHg Final  . pO2, Arterial 03/11/2015 55.0* 80.0 - 100.0 mmHg Final  . Bicarbonate 03/11/2015 33.7* 20.0 - 24.0 mEq/L Final  . TCO2 03/11/2015 35  0 - 100 mmol/L Final  . O2 Saturation 03/11/2015 89.0   Final  . Acid-Base Excess 03/11/2015 7.0* 0.0 - 2.0 mmol/L Final  . Patient temperature 03/11/2015 97.6 F   Final  . Collection site 03/11/2015 RADIAL, ALLEN'S TEST ACCEPTABLE   Final  . Drawn by 03/11/2015 Operator   Final  . Sample type 03/11/2015 ARTERIAL   Final  . Glucose-Capillary 03/11/2015 162* 65 - 99 mg/dL Final  . Glucose-Capillary 03/11/2015 146* 65 - 99 mg/dL Final  . Total hemoglobin 03/11/2015 9.1* 13.5 - 18.0 g/dL Final  . O2 Saturation 03/11/2015 57.9   Final  . Carboxyhemoglobin 03/11/2015 3.7* 0.5 - 1.5 % Final  . Methemoglobin 03/11/2015 0.8  0.0 - 1.5 % Final  . Magnesium  03/11/2015 2.0  1.7 - 2.4 mg/dL Final  . B Natriuretic Peptide 03/11/2015 1184.5* 0.0 - 100.0 pg/mL Final  . Prothrombin Time 03/11/2015 16.3* 11.6 - 15.2 seconds Final  . INR 03/11/2015 1.30  0.00 - 1.49 Final  . Ammonia 03/11/2015 26  9 - 35 umol/L Final  . Hgb A1c MFr Bld 03/11/2015 7.7* 4.8 - 5.6 % Final   Comment: (NOTE)         Pre-diabetes: 5.7 - 6.4         Diabetes: >6.4         Glycemic control for adults with diabetes: <7.0   . Mean Plasma Glucose 03/11/2015 174   Final   Comment: (NOTE) Performed At: [facility] [place], [place] [id number] Lindon Romp MD [id number]   . Glucose-Capillary 03/11/2015 93  65 - 99 mg/dL Final  . Glucose-Capillary 03/11/2015 62* 65 - 99 mg/dL Final  . Specimen Description 03/11/2015 BLOOD BLOOD LEFT HAND   Final  . Special Requests 03/11/2015 IN PEDIATRIC BOTTLE 4CC   Final  . Culture 03/11/2015 NO GROWTH 5 DAYS   Final  . Report Status 03/11/2015 03/16/2015 FINAL   Final  . Sodium 03/12/2015 135  135 - 145 mmol/L Final  . Potassium 03/12/2015 4.0  3.5 - 5.1 mmol/L Final  . Chloride 03/12/2015 93* 101 - 111 mmol/L Final  . CO2 03/12/2015 34* 22 - 32 mmol/L Final  . Glucose, Bld 03/12/2015 154* 65 - 99 mg/dL Final  . BUN 03/12/2015 38* 6 - 20 mg/dL Final  . Creatinine, Ser 03/12/2015 1.49* 0.61 - 1.24 mg/dL Final  . Calcium 03/12/2015 8.6* 8.9 - 10.3 mg/dL Final  . GFR calc non Af Amer 03/12/2015 52* >60 mL/min Final  . GFR calc Af Amer 03/12/2015 >60  >60 mL/min Final   Comment: (NOTE) The eGFR has been calculated using the CKD EPI equation. This calculation has not been validated in all clinical situations. eGFR's persistently <60 mL/min signify possible Chronic Kidney Disease.   . Anion gap 03/12/2015 8  5 - 15 Final  . Glucose-Capillary 03/11/2015 131* 65 - 99 mg/dL Final  . Glucose-Capillary 03/11/2015 145* 65 - 99 mg/dL Final  . Comment 1 03/11/2015 Venous Specimen   Final  . Glucose-Capillary  03/11/2015  35* 65 - 99 mg/dL Final  . Glucose-Capillary 03/11/2015 53* 65 - 99 mg/dL Final  . Comment 1 03/11/2015 Capillary Specimen   Final  . Glucose-Capillary 03/11/2015 175* 65 - 99 mg/dL Final  . Comment 1 03/11/2015 Capillary Specimen   Final  . Glucose-Capillary 03/11/2015 103* 65 - 99 mg/dL Final  . Comment 1 03/11/2015 Capillary Specimen   Final  . Glucose-Capillary 03/12/2015 198* 65 - 99 mg/dL Final  . Total hemoglobin 03/12/2015 8.8* 13.5 - 18.0 g/dL Final  . O2 Saturation 03/12/2015 53.9   Final  . Carboxyhemoglobin 03/12/2015 2.1* 0.5 - 1.5 % Final  . Methemoglobin 03/12/2015 0.8  0.0 - 1.5 % Final  . Glucose-Capillary 03/12/2015 131* 65 - 99 mg/dL Final  . Comment 1 03/12/2015 Capillary Specimen   Final  . Glucose-Capillary 03/12/2015 143* 65 - 99 mg/dL Final  . Comment 1 03/12/2015 Capillary Specimen   Final  . Sodium 03/13/2015 137  135 - 145 mmol/L Final  . Potassium 03/13/2015 3.6  3.5 - 5.1 mmol/L Final  . Chloride 03/13/2015 91* 101 - 111 mmol/L Final  . CO2 03/13/2015 34* 22 - 32 mmol/L Final  . Glucose, Bld 03/13/2015 260* 65 - 99 mg/dL Final  . BUN 03/13/2015 34* 6 - 20 mg/dL Final  . Creatinine, Ser 03/13/2015 1.67* 0.61 - 1.24 mg/dL Final  . Calcium 03/13/2015 8.8* 8.9 - 10.3 mg/dL Final  . GFR calc non Af Amer 03/13/2015 45* >60 mL/min Final  . GFR calc Af Amer 03/13/2015 52* >60 mL/min Final   Comment: (NOTE) The eGFR has been calculated using the CKD EPI equation. This calculation has not been validated in all clinical situations. eGFR's persistently <60 mL/min signify possible Chronic Kidney Disease.   . Anion gap 03/13/2015 12  5 - 15 Final  . Total hemoglobin 03/13/2015 14.2  13.5 - 18.0 g/dL Final  . O2 Saturation 03/13/2015 68.9   Final  . Carboxyhemoglobin 03/13/2015 2.0* 0.5 - 1.5 % Final  . Methemoglobin 03/13/2015 0.7  0.0 - 1.5 % Final  . Glucose-Capillary 03/12/2015 186* 65 - 99 mg/dL Final  . Glucose-Capillary 03/12/2015 215* 65 - 99  mg/dL Final  . Glucose-Capillary 03/13/2015 220* 65 - 99 mg/dL Final  . Comment 1 03/13/2015 Capillary Specimen   Final  . Glucose-Capillary 03/13/2015 276* 65 - 99 mg/dL Final  . Comment 1 03/13/2015 Capillary Specimen   Final  Admission on 02/21/2015, Discharged on 03/01/2015  No results displayed because visit has over 200 results.      Dg Chest Portable 1 View  04/20/2015  CLINICAL DATA:  GI bleed. EXAM: PORTABLE CHEST 1 VIEW COMPARISON:  04/12/2015 and prior exams FINDINGS: Cardiomegaly, right PICC line with tip overlying the upper SVC and small bore feeding tube identified. The feeding tube tip appears to overlie the distal esophagus. Mild right lower lung opacity may represent atelectasis or airspace disease/pneumonia. There is no evidence of pneumothorax, definite pleural effusion or pulmonary edema. Degenerative changes in the left shoulder noted. IMPRESSION: Mild right lower lung opacity -question atelectasis versus airspace disease/ pneumonia. Support apparatus as described. Small bore feeding tube tip appears to overlie the lower esophagus. Cardiomegaly. Electronically Signed   By: Margarette Canada M.D.   On: 04/20/2015 18:23   Dg Chest Port 1 View  04/12/2015  CLINICAL DATA:  J18.9 (ICD-10-CM) - PNA (pneumonia EXAM: PORTABLE CHEST 1 VIEW COMPARISON:  04/10/2015 FINDINGS: Feeding tube is in place, tip off the film and beyond the gastroesophageal junction. Right-sided PICC  line tip overlies the superior vena cava. Cardiopericardial silhouette is enlarged and stable. There is persistent opacity in the lungs bases bilaterally Dr. Pauletta Browns probably not significantly changed. IMPRESSION: 1. Persistent cardiomegaly and lung opacities most consistent with pulmonary edema. 2. Interval placement of feeding tube. Electronically Signed   By: Nolon Nations M.D.   On: 04/12/2015 14:06   Dg Abd Portable 1v  04/22/2015  CLINICAL DATA:  NG placement; best obtainable images; pt uncooperative diabetes.  Gastroesophageal reflux disease. EXAM: PORTABLE ABDOMEN - 1 VIEW COMPARISON:  04/20/2015 FINDINGS: Two supine portable views. Mild motion degradation. Right sided iliac stent. Feeding tube is again looped in the stomach, with tip positioned in the distal esophagus, directed cephalad. Right base airspace disease, suboptimally evaluated. Non-obstructive bowel gas pattern. IMPRESSION: Similar appearance of feeding tube, looped in the stomach with tip positioned in the distal esophagus, directed cephalad. Electronically Signed   By: Abigail Miyamoto M.D.   On: 04/22/2015 09:06   Dg Abd Portable 1v  04/20/2015  CLINICAL DATA:  NG tube placement EXAM: PORTABLE ABDOMEN - 1 VIEW COMPARISON:  04/18/2015 FINDINGS: There is a feeding tube looped within the fundus with the metallic tip directed cranially at the gastroesophageal junction. Recommend repositioning prior to use. There is no bowel dilatation to suggest obstruction. There is no evidence of pneumoperitoneum, portal venous gas or pneumatosis. There are no pathologic calcifications along the expected course of the ureters. The osseous structures are unremarkable. IMPRESSION: There is a feeding tube looped within the fundus with the metallic tip directed cranially at the gastroesophageal junction. Recommend repositioning prior to use. Electronically Signed   By: Kathreen Devoid   On: 04/20/2015 23:48   Dg Abd Portable 1v  04/18/2015  CLINICAL DATA:  Check feeding catheter placement EXAM: PORTABLE ABDOMEN - 1 VIEW COMPARISON:  None. FINDINGS: Scattered large and small bowel gas is noted. A feeding catheter is noted coiled within the stomach. No free air is seen. IMPRESSION: Feeding catheter coiled within the stomach. Electronically Signed   By: Inez Catalina M.D.   On: 04/18/2015 16:39   Dg Abd Portable 1v  04/18/2015  CLINICAL DATA:  Vomiting. EXAM: PORTABLE ABDOMEN - 1 VIEW COMPARISON:  04/11/2015 FINDINGS: Moderately oblique attempted supine view. Feeding tube is  looped in the stomach, with tip extending cephalad. No gross bowel obstruction or evidence of free intraperitoneal air. Right common iliac vascular stent. Right hip osteoarthritis. IMPRESSION: Position degraded exam. No gross abnormality within the abdomen or pelvis. Electronically Signed   By: Abigail Miyamoto M.D.   On: 04/18/2015 12:58   Dg Abd Portable 1v  04/11/2015  CLINICAL DATA:  Feeding tube placement EXAM: PORTABLE ABDOMEN - 1 VIEW COMPARISON:  April 10, 2015 FINDINGS: Feeding tube tip is in the stomach. Bowel gas pattern normal. Lung bases clear. IMPRESSION: Feeding tube tip in stomach.  Bowel gas pattern unremarkable. Electronically Signed   By: Lowella Grip III M.D.   On: 04/11/2015 18:00     Assessment/Plan   ICD-9-CM ICD-10-CM   1. Encephalopathy 348.30 G93.40    Hepatic - improved  2. Chronic liver failure without hepatic coma ([place])- stable 572.8 K72.10   3. Generalized weakness - due to #2, 1, 4 780.79 R53.1   4. Uncontrolled type 2 diabetes mellitus with stage 3 chronic kidney disease, with long-term current use of insulin (HCC)  250.52 E11.22    585.3 E11.65    V58.67 N18.3     Z79.4   5. Depression with anxiety -  stable 300.4 F41.8   6. Esophageal ulceration 530.20 K22.10    s/p GI bleed - stable  7. PAD (peripheral artery disease) (HCC) 443.9 I73.9    with hx right CFA stent in 2016  8. History of ETOH abuse 305.03 F10.10    dry >10 yrs    2L fluid restriction due to chronic CHF and ascites  Cont current meds as ordered  Follow BMP  Fall precautions  PT/OT/ST as ordered  GOAL: short term rehab and d/c home when medically appropriate. Communicated with pt and nursing.  Will follow  Rozalynn Buege S. Perlie Gold  [facility] [address] [place], [place] [address] [phone] Cell (Monday-Friday 8 AM - 5 PM) [phone] After 5 PM and follow prompts

## 2015-05-14 ENCOUNTER — Telehealth (HOSPITAL_COMMUNITY): Payer: Self-pay | Admitting: Vascular Surgery

## 2015-05-14 DIAGNOSIS — F1011 Alcohol abuse, in remission: Secondary | ICD-10-CM | POA: Insufficient documentation

## 2015-05-14 NOTE — Telephone Encounter (Signed)
Pt wife called she states pt has been moved to [place] [facility], she states Dr. Gala Romney was supposed to see pt over the weekend, pt legs are swollen she wants the doctor to now.. Please advise

## 2015-06-03 ENCOUNTER — Emergency Department (HOSPITAL_COMMUNITY)
Admission: EM | Admit: 2015-06-03 | Discharge: 2015-06-07 | Disposition: E | Payer: Medicare Other | Attending: Emergency Medicine | Admitting: Emergency Medicine

## 2015-06-03 ENCOUNTER — Encounter (HOSPITAL_COMMUNITY): Payer: Self-pay

## 2015-06-03 DIAGNOSIS — Z8659 Personal history of other mental and behavioral disorders: Secondary | ICD-10-CM | POA: Diagnosis not present

## 2015-06-03 DIAGNOSIS — Z79899 Other long term (current) drug therapy: Secondary | ICD-10-CM | POA: Diagnosis not present

## 2015-06-03 DIAGNOSIS — D649 Anemia, unspecified: Secondary | ICD-10-CM | POA: Diagnosis not present

## 2015-06-03 DIAGNOSIS — K219 Gastro-esophageal reflux disease without esophagitis: Secondary | ICD-10-CM | POA: Insufficient documentation

## 2015-06-03 DIAGNOSIS — I469 Cardiac arrest, cause unspecified: Secondary | ICD-10-CM | POA: Insufficient documentation

## 2015-06-03 DIAGNOSIS — Z87448 Personal history of other diseases of urinary system: Secondary | ICD-10-CM | POA: Diagnosis not present

## 2015-06-03 DIAGNOSIS — Z794 Long term (current) use of insulin: Secondary | ICD-10-CM | POA: Diagnosis not present

## 2015-06-03 DIAGNOSIS — Z9889 Other specified postprocedural states: Secondary | ICD-10-CM | POA: Insufficient documentation

## 2015-06-03 DIAGNOSIS — F17210 Nicotine dependence, cigarettes, uncomplicated: Secondary | ICD-10-CM | POA: Diagnosis not present

## 2015-06-03 DIAGNOSIS — Z8669 Personal history of other diseases of the nervous system and sense organs: Secondary | ICD-10-CM | POA: Insufficient documentation

## 2015-06-03 DIAGNOSIS — I10 Essential (primary) hypertension: Secondary | ICD-10-CM | POA: Diagnosis not present

## 2015-06-03 DIAGNOSIS — Z8739 Personal history of other diseases of the musculoskeletal system and connective tissue: Secondary | ICD-10-CM | POA: Insufficient documentation

## 2015-06-03 DIAGNOSIS — E7800 Pure hypercholesterolemia, unspecified: Secondary | ICD-10-CM | POA: Diagnosis not present

## 2015-06-03 DIAGNOSIS — E119 Type 2 diabetes mellitus without complications: Secondary | ICD-10-CM | POA: Insufficient documentation

## 2015-06-03 DIAGNOSIS — Z8619 Personal history of other infectious and parasitic diseases: Secondary | ICD-10-CM | POA: Diagnosis not present

## 2015-06-03 DIAGNOSIS — Z792 Long term (current) use of antibiotics: Secondary | ICD-10-CM | POA: Diagnosis not present

## 2015-06-03 MED ORDER — SODIUM CHLORIDE 0.9 % IV SOLN
INTRAVENOUS | Status: AC | PRN
  Administered 2015-06-03: 1000 mL via INTRAVENOUS

## 2015-06-03 MED ORDER — EPINEPHRINE HCL 0.1 MG/ML IJ SOSY
PREFILLED_SYRINGE | INTRAMUSCULAR | Status: AC | PRN
  Administered 2015-06-03: 1 mg via INTRAVENOUS

## 2015-06-06 MED FILL — Medication: Qty: 1 | Status: AC

## 2015-06-07 NOTE — Progress Notes (Signed)
   28-Jun-2015 0500  Clinical Encounter Type  Visited With Family  Visit Type ED;Death;Initial  Referral From Nurse  Consult/Referral To Nurse  Spiritual Encounters  Spiritual Needs Grief support  [facility] liaison with family post CPR death; [facility] gave next of kin info to RN and gave wife hospital placement card to call [facility] with funeral home info; [facility] offered grief and emotional support. 5:39 AM Erline Levine

## 2015-06-07 NOTE — Code Documentation (Signed)
cpr stopped, asystole on monitor total cpr time 45 + mins, called,

## 2015-06-07 NOTE — Code Documentation (Addendum)
Pt here by [facility] em[facility] from [place], cpr in progress, on lucas, last seen alive at 0205 and per fire and ems initial rhythm was PEA, since transport asystole. Given bicarb 7 epi, and calcium with EMS. cbg 300, has LEJ iv, initial capnography 30 on arrival 25. Arrived with king airway in place.

## 2015-06-07 NOTE — ED Notes (Signed)
See code narrator 

## 2015-06-07 NOTE — ED Provider Notes (Signed)
CSN: [id number]     Arrival date & time 06-21-15  0448 History   First MD Initiated Contact with Patient 06-21-2015 0458     Chief Complaint  Patient presents with  . Cardiac Arrest    Level 5 caveat: unresponsive  HPI Brought to ER from [facility] nursing home with CPR in progress. Initial rhythm was PEA. Given epi, bicarb, calcium. Hx of cirrhosis. ESRD. No additional information given by nursing staff. cbg 300s.    Past Medical History  Diagnosis Date  . Diabetes mellitus Nov 14, 2006  . 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   . Depression   . GERD (gastroesophageal reflux disease)   . Peripheral neuropathy (HCC) 2001  . Hypercholesteremia   . Hx of tracheostomy (HCC)   . Chronic systolic CHF (congestive heart failure), NYHA class 3 (HCC)     end stage as of 03/2015.  "Home"/SNF milrinone dc'd 03/2015.   Marland Kitchen Cirrhosis of liver (HCC) 03/2015    likely due to ETOH.   . Chronic calcific pancreatitis (HCC) 2001    ETOH induced.   Marland Kitchen Dysphagia 03/2015  . Peripheral vascular disease (HCC)     03/2015 right common iliac stent.   Marland Kitchen AKI (acute kidney injury) (HCC) 11/2014  . HTN (hypertension) 11-14-2006  . Foot drop, bilateral 2001    occured along with peripheral neuropathy during bout with acute pancreatitis  . Hematemesis 04/2015    EGD found erosive esophagitis due to feeding tube coiled in esophagus.   . Anemia     of chronic disease, ABL anemia 04/2015 requiring PRBC transfusion   . Candida esophagitis (HCC) 11/2013   Past Surgical History  Procedure Laterality Date  . Pancreas surgery  1999-11-14    hx pancreatitis  . Anterior cervical decomp/discectomy fusion  04/13/2012    Procedure: ANTERIOR CERVICAL DECOMPRESSION/DISCECTOMY FUSION 1 LEVEL;  Surgeon: Temple Pacini, MD;  Location: [facility] NEURO ORS;  Service: Neurosurgery;  Laterality: N/A;  Cervcial Five-Six Anterior Cervical Decompression and Fusion with Allograft and Plating  . Cardiac catheterization N/A 11/15/2014     Procedure: Right Heart Cath;  Surgeon: Dolores Patty, MD;  Location: [facility] INVASIVE CV LAB;  Service: Cardiovascular;  Laterality: N/A;  . Peripheral vascular catheterization N/A 02/26/2015    Procedure: Abdominal Aortogram;  Surgeon: Chuck Hint, MD;  Location: [facility] INVASIVE CV LAB;  Service: Cardiovascular;  Laterality: N/A;  . Incise and drain abcess  12/2007    scrotum  . Esophagogastroduodenoscopy  11/2013    HH, candida esophagitis  . Esophagogastroduodenoscopy (egd) with propofol N/A 04/21/2015    Procedure: ESOPHAGOGASTRODUODENOSCOPY (EGD) WITH PROPOFOL;  Surgeon: Napoleon Form, MD;  Location: [facility] ENDOSCOPY;  Service: Endoscopy;  Laterality: N/A;   Family History  Problem Relation Age of Onset  . Diabetes Mellitus II Sister   . CAD Brother   . Cancer Mother     ? type   . Cancer Father     ? type   Social History  Substance Use Topics  . Smoking status: Current Every Day Smoker -- 1.00 packs/day for 30 years    Types: Cigarettes  . Smokeless tobacco: Never Used  . Alcohol Use: No     Comment: Quit after his brother's death in 11-13-2005 or 8     Review of Systems  Unable to perform ROS: Patient unresponsive      Allergies  Review of patient's allergies indicates no known allergies.  Home Medications  Prior to Admission medications   Medication Sig Start Date End Date Taking? Authorizing Provider  atorvastatin (LIPITOR) 40 MG tablet Take 1 tablet (40 mg total) by mouth daily at 6 PM. 03/01/15   Amy D Clegg, NP  digoxin (LANOXIN) 0.125 MG tablet Take 1 tablet (0.125 mg total) by mouth daily. 04/12/15   Ardith Dark, MD  feeding supplement, ENSURE ENLIVE, (ENSURE ENLIVE) LIQD Take 237 mLs by mouth 3 (three) times daily between meals. 04/24/15   Drema Dallas, MD  ferrous sulfate 325 (65 FE) MG EC tablet Take 1 tablet (325 mg total) by mouth daily with breakfast. 03/08/15   Amy D Clegg, NP  insulin aspart (NOVOLOG) 100 UNIT/ML injection Inject 0-9 Units into  the skin 3 (three) times daily with meals. 04/24/15   Drema Dallas, MD  insulin glargine (LANTUS) 100 UNIT/ML injection Inject 0.1 mLs (10 Units total) into the skin daily. 04/24/15   Drema Dallas, MD  pantoprazole (PROTONIX) 40 MG tablet Take 1 tablet (40 mg total) by mouth 2 (two) times daily. 04/24/15   Drema Dallas, MD  rifaximin (XIFAXAN) 550 MG TABS tablet Take 1 tablet (550 mg total) by mouth 2 (two) times daily. 04/11/15   Ardith Dark, MD  sucralfate (CARAFATE) 1 G tablet Take 1 tablet (1 g total) by mouth 4 (four) times daily -  with meals and at bedtime. 04/24/15   Drema Dallas, MD  VENTOLIN HFA 108 (90 BASE) MCG/ACT inhaler Inhale 1-2 puffs into the lungs every 6 (six) hours as needed for wheezing or shortness of breath.  02/15/15   Historical Provider, MD   There were no vitals taken for this visit. Physical Exam  Constitutional: He appears well-developed and well-nourished.  HENT:  Head: Atraumatic.  Eyes:  Pupils fixed  Neck: No tracheal deviation present.  Cardiovascular:  Pulses present with compressions only  Pulmonary/Chest:  Breath sounds present with bagging  Abdominal: He exhibits no distension.  Musculoskeletal: He exhibits no edema.  Bilateral IO's in tibias  Neurological:  GCS 3  Skin: Skin is warm.  Nursing note and vitals reviewed.   ED Course  Procedures (including critical care time)  Cardiopulmonary Resuscitation (CPR) Procedure Note Directed/Performed by: Lyanne Co I personally directed ancillary staff and/or performed CPR in an effort to regain return of spontaneous circulation and to maintain cardiac, neuro and systemic perfusion.    Labs Review Labs Reviewed - No data to display  Imaging Review No results found. I have personally reviewed and evaluated these images and lab results as part of my medical decision-making.   EKG Interpretation None      MDM   Final diagnoses:  Cardiac arrest (HCC)   45 minutes of CPR  including epi and CPR in ER. initial rhythm in ER asystole. Family updated  Time of death 54    Azalia Bilis, MD June 22, 2015 [phone]

## 2015-06-07 NOTE — ED Notes (Signed)
Wife and family here, dr Patria Mane to see and speak

## 2015-06-07 DEATH — deceased

## 2016-10-18 IMAGING — MR MR HEAD W/O CM
8 of 10 series · 38 of 48 positions shown · non-contrast
Comparison: CT head without contrast 03/27/2015

CLINICAL DATA: Metabolic encephalopathy.  Altered mental status.

EXAM:
MRI HEAD WITHOUT CONTRAST
TECHNIQUE: Multiplanar, multiecho pulse sequences of the brain and surrounding
structures were obtained without intravenous contrast.

[Series 3: DWI · axial · 3.0mm · 1.09mm/px · z∈[-101,+40]mm · 9 of 96 slices shown (1 of 4)]
[im 1/96]
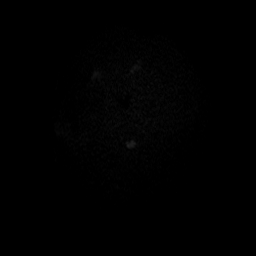
[im 16/96]
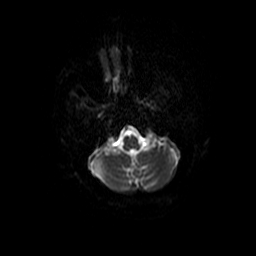
[im 32/96]
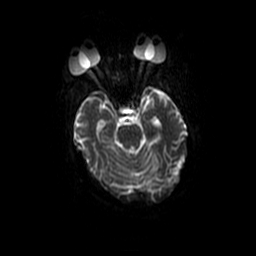
[im 40/96]
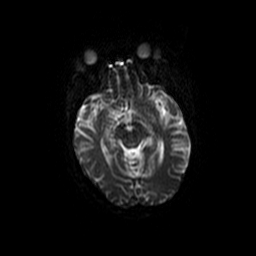
[im 48/96]
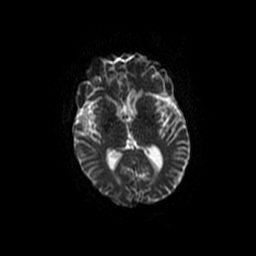
[im 56/96]
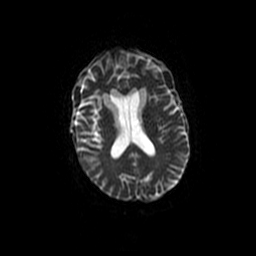
[im 64/96]
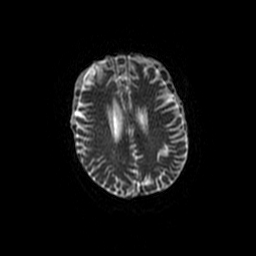
[im 80/96]
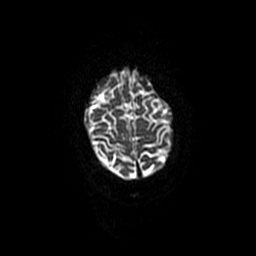
[im 96/96]
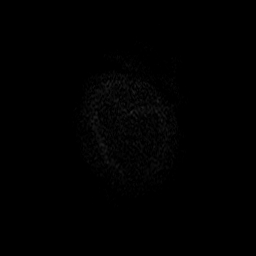

[Series 4: FLAIR · axial · 5.0mm · 0.43mm/px · z∈[-114,+35]mm · 3 of 26 slices shown (1 of 3)]
[im 1/26]
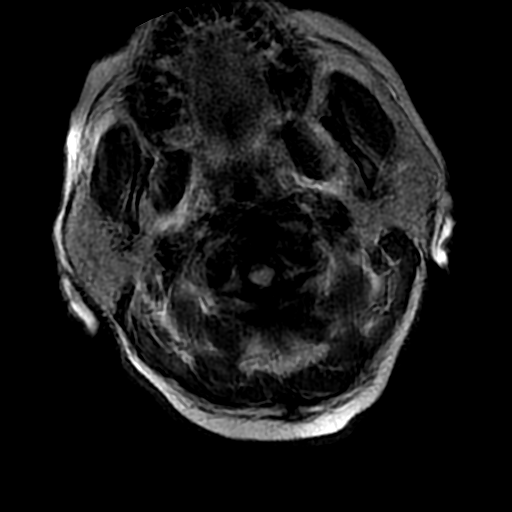
[im 13/26]
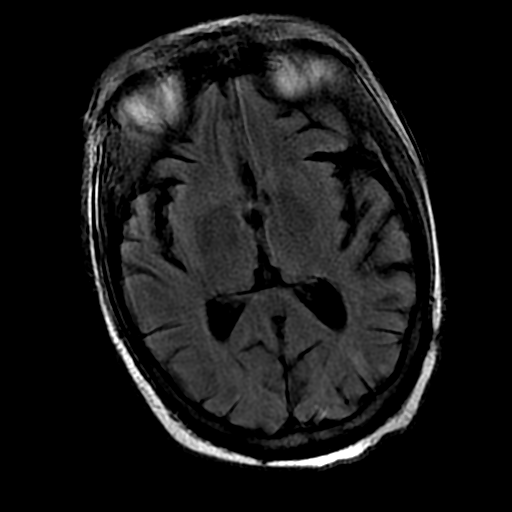
[im 26/26]
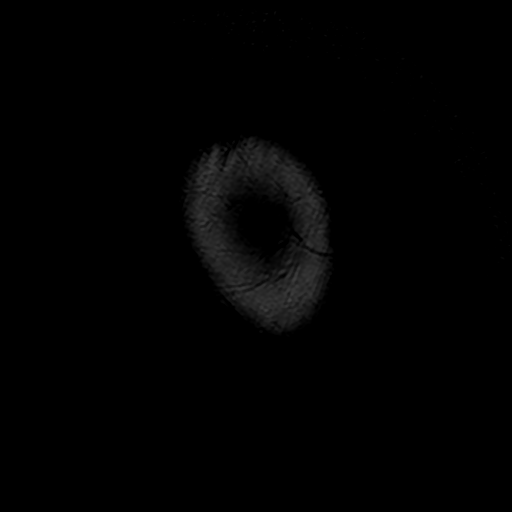

[Series 5: T2 · axial · 5.0mm · 0.43mm/px · z∈[-100,+48]mm · 3 of 26 slices shown]
[im 1/26]
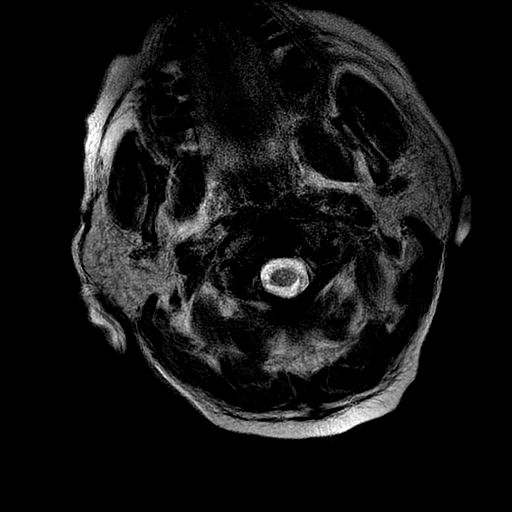
[im 13/26]
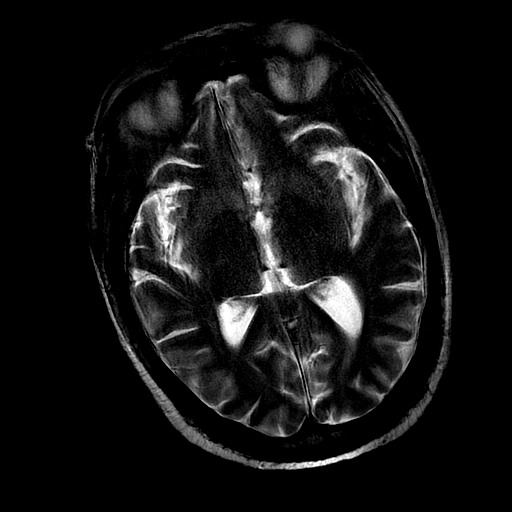
[im 26/26]
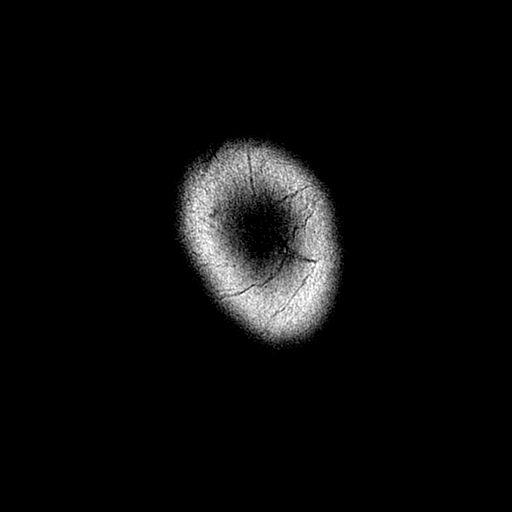

[Series 6: FLAIR · axial · 5.0mm · 0.43mm/px · z∈[-103,+35]mm · 3 of 24 slices shown (2 of 3)]
[im 1/24]
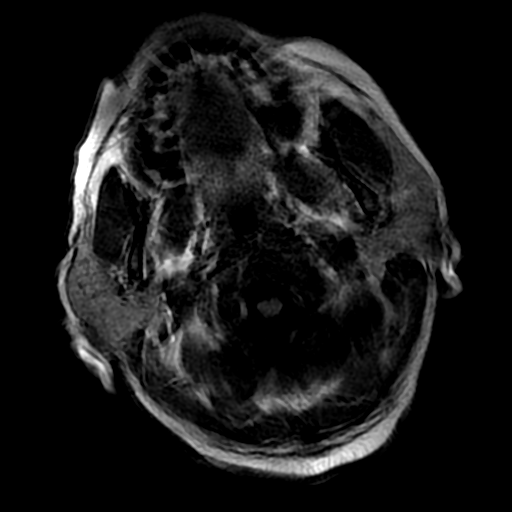
[im 12/24]
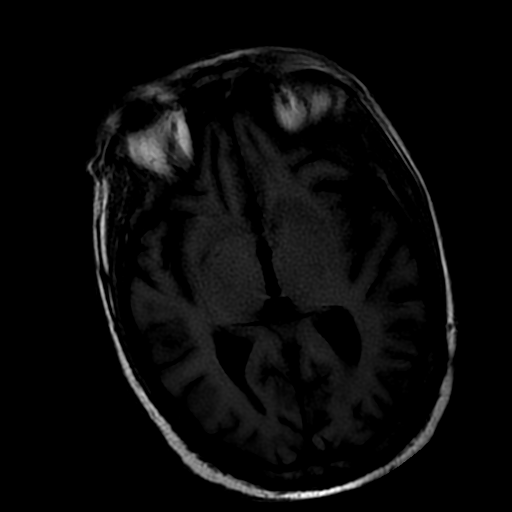
[im 24/24]
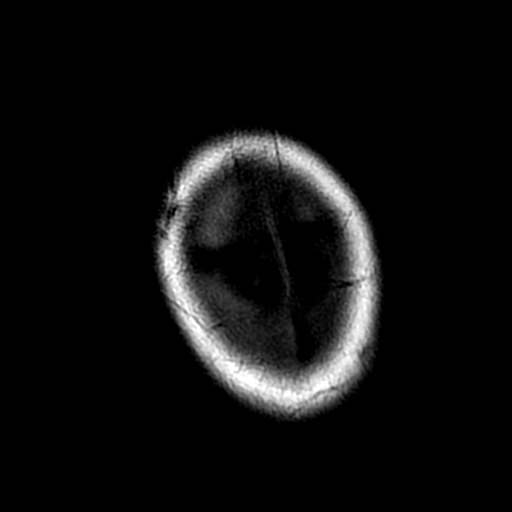

[Series 7: DWI · coronal · 5.0mm · 1.09mm/px · 8 of 66 slices shown (2 of 4)]
[im 1/66]
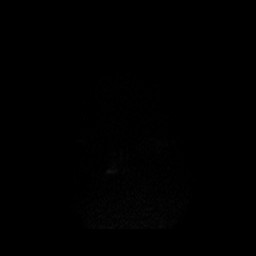
[im 10/66]
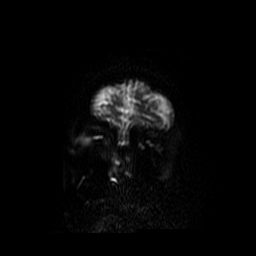
[im 19/66]
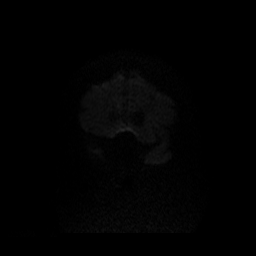
[im 28/66]
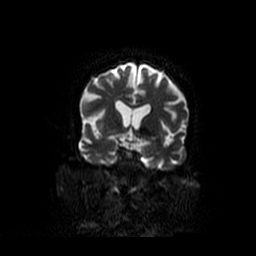
[im 38/66]
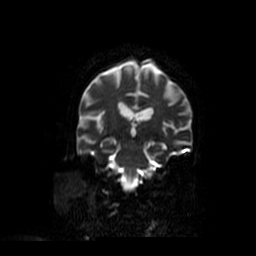
[im 47/66]
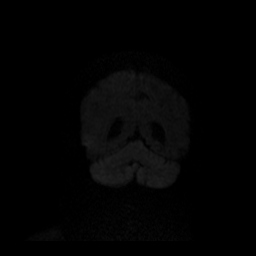
[im 56/66]
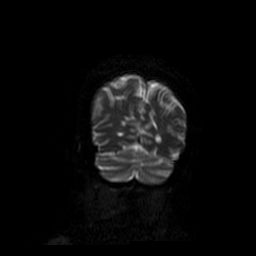
[im 66/66]
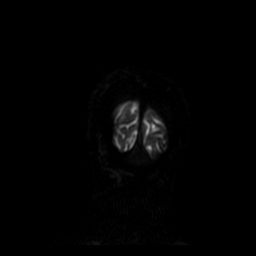

[Series 9: FLAIR · sagittal · 5.0mm · 0.47mm/px · 2 of 20 slices shown (3 of 3)]
[im 1/20]
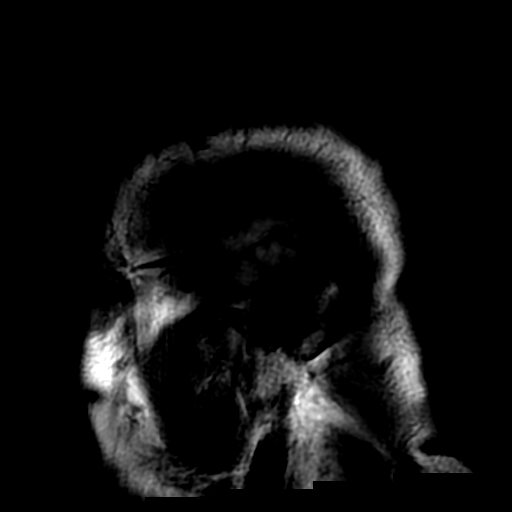
[im 20/20]
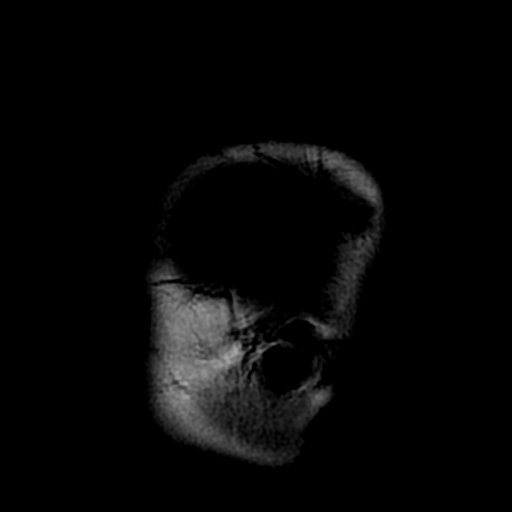

[Series 300: DWI · axial · 3.0mm · 1.09mm/px · z∈[-101,+40]mm · 6 of 48 slices shown (3 of 4)]
[im 1/48]
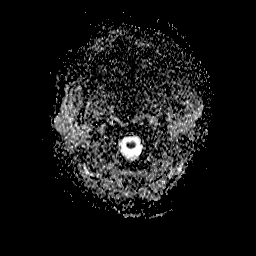
[im 10/48]
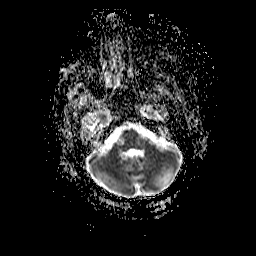
[im 19/48]
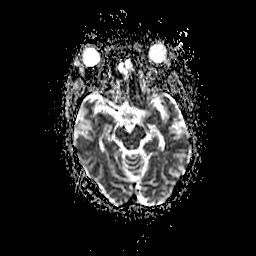
[im 29/48]
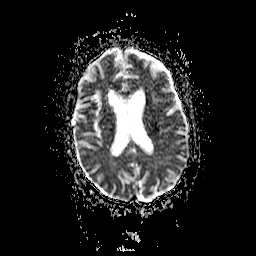
[im 38/48]
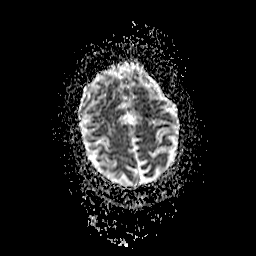
[im 48/48]
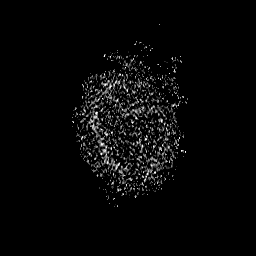

[Series 700: DWI · coronal · 5.0mm · 1.09mm/px · 4 of 33 slices shown (4 of 4)]
[im 1/33]
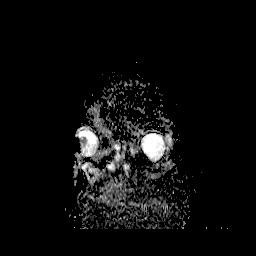
[im 11/33]
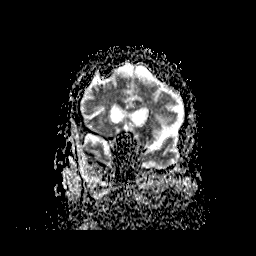
[im 22/33]
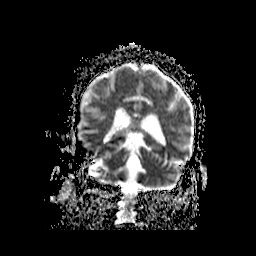
[im 33/33]
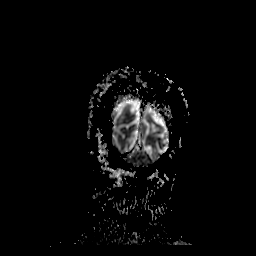

[38 of 48 positions shown; findings below may reference images not displayed]

FINDINGS: The study is mildly degraded by patient motion. Focal
encephalomalacia is noted in the anterior right frontal lobe
subjacent to the craniotomy. This is likely postoperative in nature.
Mild generalized atrophy is present. There is no other significant
white matter disease. No focal cortical signal abnormality is
present otherwise.

No acute infarct, hemorrhage, or mass lesion is present.

Flow is present in the major intracranial arteries. Internal
auditory canals and brainstem are normal. No significant extra-axial
fluid collection is present.

Flow is present in the major intracranial arteries. Globes and
orbits are intact. The paranasal sinuses and mastoid air cells are
clear. Midline structures are unremarkable.
IMPRESSION: 1. Focal encephalomalacia in the anterior right frontal lobe is
chronic, related to prior surgery.
2. Otherwise normal MRI appearance of the brain.
